# Patient Record
Sex: Male | Born: 1960 | Race: White | Hispanic: No | Marital: Married | Smoking: Never smoker
Health system: Southern US, Community
[De-identification: ages and names within clinical notes are randomized; demographics above are authoritative.]

## PROBLEM LIST (undated history)

## (undated) DIAGNOSIS — F419 Anxiety disorder, unspecified: Secondary | ICD-10-CM

## (undated) DIAGNOSIS — I493 Ventricular premature depolarization: Secondary | ICD-10-CM

## (undated) DIAGNOSIS — I77810 Thoracic aortic ectasia: Secondary | ICD-10-CM

## (undated) DIAGNOSIS — G4733 Obstructive sleep apnea (adult) (pediatric): Secondary | ICD-10-CM

## (undated) DIAGNOSIS — E785 Hyperlipidemia, unspecified: Secondary | ICD-10-CM

## (undated) DIAGNOSIS — I480 Paroxysmal atrial fibrillation: Secondary | ICD-10-CM

## (undated) DIAGNOSIS — B019 Varicella without complication: Secondary | ICD-10-CM

## (undated) DIAGNOSIS — I5042 Chronic combined systolic (congestive) and diastolic (congestive) heart failure: Secondary | ICD-10-CM

## (undated) DIAGNOSIS — E669 Obesity, unspecified: Secondary | ICD-10-CM

## (undated) DIAGNOSIS — I255 Ischemic cardiomyopathy: Secondary | ICD-10-CM

## (undated) DIAGNOSIS — I4901 Ventricular fibrillation: Secondary | ICD-10-CM

## (undated) DIAGNOSIS — I2510 Atherosclerotic heart disease of native coronary artery without angina pectoris: Secondary | ICD-10-CM

## (undated) DIAGNOSIS — I10 Essential (primary) hypertension: Secondary | ICD-10-CM

## (undated) DIAGNOSIS — I509 Heart failure, unspecified: Secondary | ICD-10-CM

## (undated) HISTORY — DX: Anxiety disorder, unspecified: F41.9

## (undated) HISTORY — DX: Chronic combined systolic (congestive) and diastolic (congestive) heart failure: I50.42

## (undated) HISTORY — DX: Essential (primary) hypertension: I10

## (undated) HISTORY — DX: Varicella without complication: B01.9

## (undated) HISTORY — DX: Ventricular fibrillation: I49.01

## (undated) HISTORY — DX: Thoracic aortic ectasia: I77.810

## (undated) HISTORY — DX: Ischemic cardiomyopathy: I25.5

## (undated) HISTORY — DX: Atherosclerotic heart disease of native coronary artery without angina pectoris: I25.10

## (undated) HISTORY — DX: Paroxysmal atrial fibrillation: I48.0

## (undated) HISTORY — DX: Obesity, unspecified: E66.9

## (undated) HISTORY — DX: Obstructive sleep apnea (adult) (pediatric): G47.33

## (undated) HISTORY — DX: Ventricular premature depolarization: I49.3

## (undated) HISTORY — DX: Hyperlipidemia, unspecified: E78.5

---

## 2012-03-10 ENCOUNTER — Inpatient Hospital Stay: Payer: Self-pay | Admitting: Internal Medicine

## 2012-03-10 DIAGNOSIS — I219 Acute myocardial infarction, unspecified: Secondary | ICD-10-CM

## 2012-03-10 LAB — CBC
HCT: 43.6 % (ref 40.0–52.0)
MCHC: 32.8 g/dL (ref 32.0–36.0)
MCV: 77 fL — ABNORMAL LOW (ref 80–100)
RBC: 5.63 10*6/uL (ref 4.40–5.90)
RDW: 15.7 % — ABNORMAL HIGH (ref 11.5–14.5)

## 2012-03-10 LAB — COMPREHENSIVE METABOLIC PANEL
Alkaline Phosphatase: 95 U/L (ref 50–136)
BUN: 15 mg/dL (ref 7–18)
Bilirubin,Total: 0.2 mg/dL (ref 0.2–1.0)
Co2: 24 mmol/L (ref 21–32)
Creatinine: 0.87 mg/dL (ref 0.60–1.30)
EGFR (Non-African Amer.): >60
Osmolality: 281 (ref 275–301)
SGOT(AST): 22 U/L (ref 15–37)
SGPT (ALT): 26 U/L (ref 12–78)
Sodium: 139 mmol/L (ref 136–145)
Total Protein: 7.8 g/dL (ref 6.4–8.2)

## 2012-03-10 LAB — CBC WITH DIFFERENTIAL/PLATELET
Eosinophil %: 0.2 %
HCT: 41.6 % (ref 40.0–52.0)
Lymphocyte #: 0.9 10*3/uL — ABNORMAL LOW (ref 1.0–3.6)
MCV: 78 fL — ABNORMAL LOW (ref 80–100)
Monocyte %: 4.6 %
Neutrophil #: 7.3 10*3/uL — ABNORMAL HIGH (ref 1.4–6.5)
Neutrophil %: 84.2 %
Platelet: 268 10*3/uL (ref 150–440)
RBC: 5.31 10*6/uL (ref 4.40–5.90)
WBC: 8.7 10*3/uL (ref 3.8–10.6)

## 2012-03-10 LAB — TROPONIN I
Troponin-I: 0.03 ng/mL
Troponin-I: 0.44 ng/mL — ABNORMAL HIGH
Troponin-I: 1.5 ng/mL — ABNORMAL HIGH

## 2012-03-10 LAB — BASIC METABOLIC PANEL
BUN: 16 mg/dL (ref 7–18)
Creatinine: 0.67 mg/dL (ref 0.60–1.30)
EGFR (Non-African Amer.): >60
Glucose: 111 mg/dL — ABNORMAL HIGH (ref 65–99)
Osmolality: 278 (ref 275–301)
Potassium: 4.5 mmol/L (ref 3.5–5.1)
Sodium: 138 mmol/L (ref 136–145)

## 2012-03-10 LAB — PROTIME-INR
INR: 0.9
Prothrombin Time: 12.2 secs (ref 11.5–14.7)

## 2012-03-10 LAB — CK TOTAL AND CKMB (NOT AT ARMC): CK, Total: 235 U/L — ABNORMAL HIGH (ref 35–232)

## 2012-03-10 LAB — MAGNESIUM: Magnesium: 1.9 mg/dL

## 2012-03-11 DIAGNOSIS — I059 Rheumatic mitral valve disease, unspecified: Secondary | ICD-10-CM

## 2012-03-11 DIAGNOSIS — I2510 Atherosclerotic heart disease of native coronary artery without angina pectoris: Secondary | ICD-10-CM

## 2012-03-11 LAB — BASIC METABOLIC PANEL
Anion Gap: 9 (ref 7–16)
BUN: 10 mg/dL (ref 7–18)
Calcium, Total: 8.1 mg/dL — ABNORMAL LOW (ref 8.5–10.1)
Chloride: 100 mmol/L (ref 98–107)
Co2: 28 mmol/L (ref 21–32)
EGFR (Non-African Amer.): >60
Osmolality: 274 (ref 275–301)
Potassium: 3.7 mmol/L (ref 3.5–5.1)

## 2012-03-11 LAB — LIPID PANEL
Cholesterol: 167 mg/dL (ref 0–200)
HDL Cholesterol: 41 mg/dL (ref 40–60)
Triglycerides: 98 mg/dL (ref 0–200)

## 2012-03-11 LAB — APTT: Activated PTT: 108.6 secs — ABNORMAL HIGH (ref 23.6–35.9)

## 2012-03-11 LAB — TROPONIN I: Troponin-I: 9.9 ng/mL — ABNORMAL HIGH

## 2012-03-12 DIAGNOSIS — I2510 Atherosclerotic heart disease of native coronary artery without angina pectoris: Secondary | ICD-10-CM | POA: Insufficient documentation

## 2012-03-12 HISTORY — PX: CORONARY ANGIOPLASTY: SHX604

## 2012-03-12 HISTORY — PX: CARDIAC CATHETERIZATION: SHX172

## 2012-03-12 LAB — CK TOTAL AND CKMB (NOT AT ARMC): CK-MB: 4.5 ng/mL — ABNORMAL HIGH (ref 0.5–3.6)

## 2012-03-13 LAB — BASIC METABOLIC PANEL
Anion Gap: 7 (ref 7–16)
Calcium, Total: 8.5 mg/dL (ref 8.5–10.1)
Chloride: 101 mmol/L (ref 98–107)
Co2: 28 mmol/L (ref 21–32)
Creatinine: 0.79 mg/dL (ref 0.60–1.30)
EGFR (African American): >60
Osmolality: 271 (ref 275–301)
Potassium: 3.8 mmol/L (ref 3.5–5.1)

## 2012-03-22 ENCOUNTER — Encounter: Payer: Self-pay | Admitting: Cardiovascular Disease

## 2012-03-22 ENCOUNTER — Ambulatory Visit (INDEPENDENT_AMBULATORY_CARE_PROVIDER_SITE_OTHER): Payer: BC Managed Care – PPO | Admitting: Cardiovascular Disease

## 2012-03-22 VITALS — BP 132/90 | HR 98 | Ht 70.0 in | Wt 326.0 lb

## 2012-03-22 DIAGNOSIS — I10 Essential (primary) hypertension: Secondary | ICD-10-CM

## 2012-03-22 DIAGNOSIS — E785 Hyperlipidemia, unspecified: Secondary | ICD-10-CM | POA: Insufficient documentation

## 2012-03-22 DIAGNOSIS — I2510 Atherosclerotic heart disease of native coronary artery without angina pectoris: Secondary | ICD-10-CM

## 2012-03-22 DIAGNOSIS — I25810 Atherosclerosis of coronary artery bypass graft(s) without angina pectoris: Secondary | ICD-10-CM

## 2012-03-22 NOTE — Assessment & Plan Note (Signed)
His blood pressure has improved significantly on medications.

## 2012-03-22 NOTE — Patient Instructions (Addendum)
Continue same medications.  Try to attend cardiac rehab.  Follow up in 3 months. Be fasting that day to get labs done.

## 2012-03-22 NOTE — Assessment & Plan Note (Signed)
Continue treatment with atorvastatin with a target LDL of less than 70. This will be checked during his next followup visit.

## 2012-03-22 NOTE — Assessment & Plan Note (Signed)
The patient is doing well after her recent non-ST elevation myocardial infarction. He had an angioplasty and drug-eluting stent placement to the LAD. I recommend dual antiplatelet therapy for at least one year and preferably longer. I discussed with him the importance of treating his risk factors and lifestyle changes as well as weight loss. I advised him to attend cardiac rehabilitation.

## 2012-03-22 NOTE — Progress Notes (Signed)
HPI  This is a 51 year old man who is here today for a followup visit. He presented recently to [facility] with a small non-ST elevation myocardial infarction. He underwent cardiac catheterization which showed severe single-vessel coronary artery disease in the left anterior descending artery. There was a 60% proximal stenosis followed by diffuse 80% disease in the midsegment and a 70% discrete stenosis distally. He underwent an angioplasty and drug-eluting stent placement to the proximal and mid LAD without complications. The distal LAD lesion was left to be treated medically. He was noted to be hypertensive and was not on medications. He was started on appropriate medications and also was started on atorvastatin for hyperlipidemia. Overall, he is feeling well. He denies any recurrent chest pain. He is recovering from a cold.  No Known Allergies   Current Outpatient Prescriptions on File Prior to Visit  Medication Sig Dispense Refill  . ALPRAZolam (XANAX) 0.25 MG tablet Take 0.25 mg by mouth at bedtime as needed.      Marland Kitchen amLODipine (NORVASC) 10 MG tablet Take 10 mg by mouth daily.      Marland Kitchen aspirin 325 MG tablet Take 325 mg by mouth daily.      Marland Kitchen atorvastatin (LIPITOR) 40 MG tablet Take 40 mg by mouth daily.      . clopidogrel (PLAVIX) 75 MG tablet Take 75 mg by mouth daily.      Marland Kitchen lisinopril (PRINIVIL,ZESTRIL) 40 MG tablet Take 40 mg by mouth daily.      . metoprolol (LOPRESSOR) 50 MG tablet Take 50 mg by mouth 2 (two) times daily.      . nitroGLYCERIN (NITROSTAT) 0.4 MG SL tablet Place 0.4 mg under the tongue every 5 (five) minutes as needed.         Past Medical History  Diagnosis Date  . Anxiety   . Obesity   . Hypokalemia   . Non-ST elevation MI (NSTEMI)   . Coronary artery disease 03/12/2012    Non-ST elevation myocardial infarction. Cardiac catheterization: LAD: 60% proximal, 80% diffuse mid and 70% distal disease. No significant obstructive disease in LCx and RCA.  s/p drug-eluting  stent to the proximal and mid LAD.  Marland Kitchen Hyperlipidemia   . Hypertension      Past Surgical History  Procedure Date  . Cardiac catheterization 03/12/2012  . Coronary angioplasty 03/12/2012    s/p drug eluting stent to the mid LAD : 3.5 x 38 mm Xience drug-eluting stent. Post dilated to 4.0 in mid segment and 4.5 proximally.     Family History  Problem Relation Age of Onset  . Heart disease Mother   . Hyperlipidemia Mother   . Hypertension Mother   . Heart attack Mother 24    CABG X 4; past at age 57  . Heart disease Sister 34    s/p stents      History   Social History  . Marital Status: Widowed    Spouse Name: N/A    Number of Children: N/A  . Years of Education: N/A   Occupational History  . Not on file.   Social History Main Topics  . Smoking status: Never Smoker   . Smokeless tobacco: Not on file  . Alcohol Use: 1.2 oz/week    2 Cans of beer per week  . Drug Use: No  . Sexually Active:    Other Topics Concern  . Not on file   Social History Narrative  . No narrative on file  PHYSICAL EXAM   BP 132/90  Pulse 98  Ht 5\' 10"  (1.778 m)  Wt 326 lb (147.873 kg)  BMI 46.78 kg/m2 Constitutional: He is oriented to person, place, and time. He appears well-developed and well-nourished. No distress.  HENT: No nasal discharge.  Head: Normocephalic and atraumatic.  Eyes: Pupils are equal and round. Right eye exhibits no discharge. Left eye exhibits no discharge.  Neck: Normal range of motion. Neck supple. No JVD present. No thyromegaly present.  Cardiovascular: Normal rate, regular rhythm, normal heart sounds and. Exam reveals no gallop and no friction rub. No murmur heard.  Pulmonary/Chest: Effort normal and breath sounds normal. No stridor. No respiratory distress. He has no wheezes. He has no rales. He exhibits no tenderness.  Abdominal: Soft. Bowel sounds are normal. He exhibits no distension. There is no tenderness. There is no rebound and no guarding.    Musculoskeletal: Normal range of motion. He exhibits no edema and no tenderness.  Neurological: He is alert and oriented to person, place, and time. Coordination normal.  Skin: Skin is warm and dry. No rash noted. He is not diaphoretic. No erythema. No pallor.  Psychiatric: He has a normal mood and affect. His behavior is normal. Judgment and thought content normal.  Right radial pulse is normal with no hematoma.     EKG: Sinus  Rhythm  WITHIN NORMAL LIMITS   ASSESSMENT AND PLAN

## 2012-04-13 ENCOUNTER — Other Ambulatory Visit: Payer: Self-pay | Admitting: Physician Assistant

## 2012-04-30 ENCOUNTER — Telehealth: Payer: Self-pay

## 2012-04-30 ENCOUNTER — Observation Stay: Payer: Self-pay | Admitting: Internal Medicine

## 2012-04-30 LAB — COMPREHENSIVE METABOLIC PANEL
Albumin: 3.5 g/dL (ref 3.4–5.0)
Anion Gap: 7 (ref 7–16)
BUN: 13 mg/dL (ref 7–18)
Bilirubin,Total: 0.3 mg/dL (ref 0.2–1.0)
Calcium, Total: 8.5 mg/dL (ref 8.5–10.1)
Chloride: 106 mmol/L (ref 98–107)
EGFR (African American): >60
Glucose: 103 mg/dL — ABNORMAL HIGH (ref 65–99)
Osmolality: 282 (ref 275–301)
Potassium: 3.7 mmol/L (ref 3.5–5.1)
SGOT(AST): 30 U/L (ref 15–37)
Sodium: 141 mmol/L (ref 136–145)

## 2012-04-30 LAB — CBC
MCH: 26.2 pg (ref 26.0–34.0)
MCHC: 33.4 g/dL (ref 32.0–36.0)
Platelet: 278 10*3/uL (ref 150–440)
RBC: 5.24 10*6/uL (ref 4.40–5.90)
WBC: 6.2 10*3/uL (ref 3.8–10.6)

## 2012-04-30 LAB — CK TOTAL AND CKMB (NOT AT ARMC)
CK, Total: 128 U/L (ref 35–232)
CK-MB: 1.3 ng/mL (ref 0.5–3.6)

## 2012-04-30 LAB — TROPONIN I: Troponin-I: <0.02 ng/mL

## 2012-04-30 NOTE — Telephone Encounter (Signed)
Pt calling in with c/o sudden onset left sided CP. He has taken SL NTG x 2 without relief. He cannot manipulate area to reproduce pain. Says it is similar to pain felt prior to recent MI.  Associated with fatigue and weakness. Advised pt to go to ER. Understanding verb.

## 2012-05-01 ENCOUNTER — Telehealth: Payer: Self-pay

## 2012-05-01 DIAGNOSIS — R079 Chest pain, unspecified: Secondary | ICD-10-CM

## 2012-05-01 LAB — TROPONIN I
Troponin-I: <0.02 ng/mL
Troponin-I: <0.02 ng/mL

## 2012-05-01 LAB — CK TOTAL AND CKMB (NOT AT ARMC)
CK, Total: 103 U/L (ref 35–232)
CK-MB: 1.2 ng/mL (ref 0.5–3.6)
CK-MB: 1.3 ng/mL (ref 0.5–3.6)

## 2012-05-01 NOTE — Telephone Encounter (Signed)
Pt d/c this am Will call back 05/02/12 for TCM call

## 2012-05-01 NOTE — Telephone Encounter (Signed)
Message copied by Marcelle Overlie on Wed May 01, 2012 10:50 AM ------      Message from: Thersa Salt      Created: Wed May 01, 2012  9:41 AM      Regarding: TCM pt       Pt to see Kirke Corin 10/22

## 2012-05-01 NOTE — Telephone Encounter (Signed)
TCM call

## 2012-05-02 ENCOUNTER — Encounter: Payer: Self-pay | Admitting: *Deleted

## 2012-05-02 NOTE — Telephone Encounter (Signed)
Pt has f/u visit scheduled for 05/14/12.

## 2012-05-02 NOTE — Telephone Encounter (Signed)
Called pt to find out how he is doing.  Person answering the phone states that he is at work and seems to be doing ok.  No problems that this person knows of.  Pt will not be home until 6:00pm or later.  LM for pt to call if he is having any problems or concerns.

## 2012-05-06 ENCOUNTER — Encounter: Payer: BC Managed Care – PPO | Admitting: Cardiovascular Disease

## 2012-05-14 ENCOUNTER — Encounter: Payer: Self-pay | Admitting: Cardiovascular Disease

## 2012-05-14 ENCOUNTER — Ambulatory Visit (INDEPENDENT_AMBULATORY_CARE_PROVIDER_SITE_OTHER): Payer: BC Managed Care – PPO | Admitting: Cardiovascular Disease

## 2012-05-14 VITALS — BP 150/92 | HR 86 | Ht 70.0 in | Wt 350.8 lb

## 2012-05-14 DIAGNOSIS — I10 Essential (primary) hypertension: Secondary | ICD-10-CM

## 2012-05-14 DIAGNOSIS — I2510 Atherosclerotic heart disease of native coronary artery without angina pectoris: Secondary | ICD-10-CM

## 2012-05-14 DIAGNOSIS — E785 Hyperlipidemia, unspecified: Secondary | ICD-10-CM

## 2012-05-14 MED ORDER — METOPROLOL TARTRATE 50 MG PO TABS: 50.0000 mg | ORAL_TABLET | Freq: Two times a day (BID) | ORAL | Status: DC

## 2012-05-14 MED ORDER — CLOPIDOGREL BISULFATE 75 MG PO TABS: 75.0000 mg | ORAL_TABLET | Freq: Every day | ORAL | Status: DC

## 2012-05-14 MED ORDER — LISINOPRIL 40 MG PO TABS: 40.0000 mg | ORAL_TABLET | Freq: Every day | ORAL | Status: DC

## 2012-05-14 MED ORDER — PANTOPRAZOLE SODIUM 40 MG PO TBEC: 40.0000 mg | DELAYED_RELEASE_TABLET | Freq: Every day | ORAL | Status: DC

## 2012-05-14 MED ORDER — AMLODIPINE BESYLATE 10 MG PO TABS: 10.0000 mg | ORAL_TABLET | Freq: Every day | ORAL | Status: DC

## 2012-05-14 NOTE — Patient Instructions (Addendum)
Stop Omeprazole.  Start Protonix 40 mg once daily (do not take at the same time with Plavix).  Resume Lipitor.  Try to attend cardiac rehab.  Follow up in 2 months

## 2012-05-14 NOTE — Assessment & Plan Note (Signed)
The patient had a recent hospitalization for chest discomfort which overall was atypical and could have been musculoskeletal. However, he reports that it was similar but less intense than his previous myocardial infarction. His cardiac enzymes were negative an ECG shows no acute changes. I recommend continuing medical therapy for now. He can resume atorvastatin and reevaluate his symptoms. If he develops recurrent chest pain, I will have a low threshold for coronary angiography due to a long drug-eluting stent in the proximal LAD with residual disease distally that was left to be treated medically. I also advised him to attend cardiac rehabilitation. Due to interaction with Plavix, I will stop omeprazole and switch him to protonic 40 mg once daily.

## 2012-05-14 NOTE — Assessment & Plan Note (Signed)
His blood pressure is elevated. However, he did not take any of his blood pressure medications today due to lack of refills. I refilled all his medications.

## 2012-05-14 NOTE — Assessment & Plan Note (Signed)
Resume Lipitor. If he develops myalgia, I would consider rosuvastatin.

## 2012-05-14 NOTE — Progress Notes (Signed)
HPI  This is a 51 year old man who is here today for a followup visit. He presented in August  to [facility] with a small non-ST elevation myocardial infarction. He underwent cardiac catheterization which showed severe single-vessel coronary artery disease in the left anterior descending artery. There was a 60% proximal stenosis followed by diffuse 80% disease in the midsegment and a 70% discrete stenosis distally. He underwent an angioplasty and long drug-eluting stent placement to the proximal and mid LAD without complications. The distal LAD lesion was left to be treated medically. He was noted to be hypertensive and was not on medications. He was started on appropriate medications and also was started on atorvastatin for hyperlipidemia.  He was hospitalized briefly recently at [facility] for one episode of chest discomfort. It was left-sided described as an aching sensation and superficial. The chest area was tender to touch. Although his symptoms were atypical, he reports that this is similar to his previous myocardial infarction but less intense. He ruled out for myocardial infarction. ECG showed no acute changes. He does have known history of GERD but the symptoms were different from his usual heartburn. One possibility was musculoskeletal pain related to atorvastatin. He was told to hold the medication reevaluate the response. Overall, he feels better. He has not had chest pain since hospital discharge. He denies exertional symptoms. He is still at significant stress as his son still waiting for a liver transplant. No Known Allergies   Current Outpatient Prescriptions on File Prior to Visit  Medication Sig Dispense Refill  . ALPRAZolam (XANAX) 0.25 MG tablet Take 0.25 mg by mouth at bedtime as needed.      Marland Kitchen aspirin 81 MG tablet Take 81 mg by mouth daily.      . nitroGLYCERIN (NITROSTAT) 0.4 MG SL tablet Place 0.4 mg under the tongue every 5 (five) minutes as needed.      Marland Kitchen DISCONTD: amLODipine  (NORVASC) 10 MG tablet Take 10 mg by mouth daily.      Marland Kitchen DISCONTD: lisinopril (PRINIVIL,ZESTRIL) 40 MG tablet Take 40 mg by mouth daily.      Marland Kitchen DISCONTD: metoprolol (LOPRESSOR) 50 MG tablet Take 50 mg by mouth 2 (two) times daily.      . pantoprazole (PROTONIX) 40 MG tablet Take 1 tablet (40 mg total) by mouth daily.  30 tablet  6     Past Medical History  Diagnosis Date  . Anxiety   . Obesity   . Hypokalemia   . Non-ST elevation MI (NSTEMI)   . Coronary artery disease 03/12/2012    Non-ST elevation myocardial infarction. Cardiac catheterization: LAD: 60% proximal, 80% diffuse mid and 70% distal disease. No significant obstructive disease in LCx and RCA.  s/p drug-eluting stent to the proximal and mid LAD.  Marland Kitchen Hyperlipidemia   . Hypertension      Past Surgical History  Procedure Date  . Cardiac catheterization 03/12/2012  . Coronary angioplasty 03/12/2012    s/p drug eluting stent to the mid LAD : 3.5 x 38 mm Xience drug-eluting stent. Post dilated to 4.0 in mid segment and 4.5 proximally.     Family History  Problem Relation Age of Onset  . Heart disease Mother   . Hyperlipidemia Mother   . Hypertension Mother   . Heart attack Mother 27    CABG X 4; past at age 21  . Heart disease Sister 75    s/p stents      History   Social History  .  Marital Status: Widowed    Spouse Name: N/A    Number of Children: N/A  . Years of Education: N/A   Occupational History  . Not on file.   Social History Main Topics  . Smoking status: Never Smoker   . Smokeless tobacco: Not on file  . Alcohol Use: 1.2 oz/week    2 Cans of beer per week     weekly  . Drug Use: No  . Sexually Active:    Other Topics Concern  . Not on file   Social History Narrative  . No narrative on file      PHYSICAL EXAM   BP 150/92  Pulse 86  Ht 5\' 10"  (1.778 m)  Wt 350 lb 12 oz (159.099 kg)  BMI 50.33 kg/m2 Constitutional: He is oriented to person, place, and time. He appears well-developed  and well-nourished. No distress.  HENT: No nasal discharge.  Head: Normocephalic and atraumatic.  Eyes: Pupils are equal and round. Right eye exhibits no discharge. Left eye exhibits no discharge.  Neck: Normal range of motion. Neck supple. No JVD present. No thyromegaly present.  Cardiovascular: Normal rate, regular rhythm, normal heart sounds and. Exam reveals no gallop and no friction rub. No murmur heard.  Pulmonary/Chest: Effort normal and breath sounds normal. No stridor. No respiratory distress. He has no wheezes. He has no rales. He exhibits no tenderness.  Abdominal: Soft. Bowel sounds are normal. He exhibits no distension. There is no tenderness. There is no rebound and no guarding.  Musculoskeletal: Normal range of motion. He exhibits no edema and no tenderness.  Neurological: He is alert and oriented to person, place, and time. Coordination normal.  Skin: Skin is warm and dry. No rash noted. He is not diaphoretic. No erythema. No pallor.  Psychiatric: He has a normal mood and affect. His behavior is normal. Judgment and thought content normal.  Right radial pulse is normal with no hematoma.     EKG: Sinus  Rhythm  WITHIN NORMAL LIMITS   ASSESSMENT AND PLAN

## 2012-05-24 ENCOUNTER — Encounter: Payer: Self-pay | Admitting: Cardiovascular Disease

## 2012-06-24 ENCOUNTER — Ambulatory Visit: Payer: BC Managed Care – PPO | Admitting: Cardiovascular Disease

## 2012-07-23 ENCOUNTER — Ambulatory Visit: Payer: BC Managed Care – PPO | Admitting: Cardiovascular Disease

## 2012-07-23 ENCOUNTER — Other Ambulatory Visit: Payer: Self-pay

## 2012-07-23 DIAGNOSIS — I2510 Atherosclerotic heart disease of native coronary artery without angina pectoris: Secondary | ICD-10-CM

## 2012-07-31 ENCOUNTER — Encounter: Payer: Self-pay | Admitting: Nurse Practitioner

## 2012-07-31 ENCOUNTER — Ambulatory Visit (INDEPENDENT_AMBULATORY_CARE_PROVIDER_SITE_OTHER): Payer: BC Managed Care – PPO | Admitting: Nurse Practitioner

## 2012-07-31 VITALS — BP 148/102 | HR 98 | Ht 70.5 in | Wt 342.5 lb

## 2012-07-31 DIAGNOSIS — F411 Generalized anxiety disorder: Secondary | ICD-10-CM

## 2012-07-31 DIAGNOSIS — F419 Anxiety disorder, unspecified: Secondary | ICD-10-CM

## 2012-07-31 DIAGNOSIS — I2510 Atherosclerotic heart disease of native coronary artery without angina pectoris: Secondary | ICD-10-CM

## 2012-07-31 DIAGNOSIS — I10 Essential (primary) hypertension: Secondary | ICD-10-CM

## 2012-07-31 MED ORDER — ATORVASTATIN CALCIUM 40 MG PO TABS: 40.0000 mg | ORAL_TABLET | Freq: Every day | ORAL | Status: DC

## 2012-07-31 MED ORDER — METOPROLOL TARTRATE 100 MG PO TABS: 100.0000 mg | ORAL_TABLET | Freq: Two times a day (BID) | ORAL | Status: DC

## 2012-07-31 MED ORDER — ALPRAZOLAM 0.25 MG PO TABS: 0.2500 mg | ORAL_TABLET | Freq: Two times a day (BID) | ORAL | Status: DC | PRN

## 2012-07-31 NOTE — Progress Notes (Signed)
Patient Name: Steve Burnett Date of Encounter: 07/31/2012  Primary Care Provider:  He does not have a PCP. Primary Cardiologist:  M. Kirke Corin, MD  Patient Profile  36 male with history of coronary artery disease who presents for followup.  Problem List   Past Medical History  Diagnosis Date  . Anxiety   . Obesity   . Hypokalemia   . Coronary artery disease     a. 02/2012 NSTEMI/Cath/PCI: LAD: 60% proximal, 80% diffuse mid & 70d, nonobs LCX/RCA --> DES to the prox/mid LAD.  Marland Kitchen Hyperlipidemia   . Hypertension    Past Surgical History  Procedure Date  . Cardiac catheterization 03/12/2012  . Coronary angioplasty 03/12/2012    s/p drug eluting stent to the mid LAD : 3.5 x 38 mm Xience drug-eluting stent. Post dilated to 4.0 in mid segment and 4.5 proximally.   Allergies  No Known Allergies  HPI  52 year old male with the above problem list. He is status post non-ST segment elevation MI in August 2013 with successful drug-eluting stent placement to the proximal mid LAD at that time. He has been doing well since that time without recurrence of angina over dyspnea. He has a fair amount of anxiety in his life as his son is ill with hepatic failure and is currently awaiting a transplant. Patient is somewhat tearful today in discussing it. In this setting, he is noted that his blood pressure often is in the 150s to 160s at home. He previously was taking when necessary Xanax but does not have a primary care provider and has been out of this for a little while.  He's been tolerating his medications well.  Home Medications  Prior to Admission medications   Medication Sig Start Date End Date Taking? Authorizing Provider  amLODipine (NORVASC) 10 MG tablet Take 1 tablet (10 mg total) by mouth daily. 05/14/12  Yes Iran Ouch, MD  aspirin 81 MG tablet Take 81 mg by mouth daily.   Yes Historical Provider, MD  clopidogrel (PLAVIX) 75 MG tablet Take 1 tablet (75 mg total) by mouth daily.  05/14/12  Yes Iran Ouch, MD  lisinopril (PRINIVIL,ZESTRIL) 40 MG tablet Take 1 tablet (40 mg total) by mouth daily. 05/14/12  Yes Iran Ouch, MD  nitroGLYCERIN (NITROSTAT) 0.4 MG SL tablet Place 0.4 mg under the tongue every 5 (five) minutes as needed.   Yes Historical Provider, MD  pantoprazole (PROTONIX) 40 MG tablet Take 1 tablet (40 mg total) by mouth daily. 05/14/12  Yes Iran Ouch, MD  ALPRAZolam Prudy Feeler) 0.25 MG tablet Take 1 tablet (0.25 mg total) by mouth 2 (two) times daily as needed. 07/31/12   Ok Anis, NP  atorvastatin (LIPITOR) 40 MG tablet Take 1 tablet (40 mg total) by mouth daily. 07/31/12   Ok Anis, NP  metoprolol (LOPRESSOR) 100 MG tablet Take 1 tablet (100 mg total) by mouth 2 (two) times daily. 07/31/12   Ok Anis, NP   Review of Systems  He denies chest pain, doe, pnd, orthopnea, n, v, dizziness, syncope, edema, weight gain, or early satiety.  He has significant anxiety, compounded by his son's illness.  All other systems reviewed and are otherwise negative except as noted above.  Physical Exam  Blood pressure 148/102, pulse 98, height 5' 10.5" (1.791 m), weight 342 lb 8 oz (155.357 kg).  General: Pleasant, NAD Psych: Normal affect. Neuro: Alert and oriented X 3. Moves all extremities spontaneously. HEENT: Normal  Neck: Supple without  bruits or JVD. Lungs:  Resp regular and unlabored, CTA. Heart: RRR no s3, s4, or murmurs. Abdomen: Soft, non-tender, non-distended, BS + x 4.  Extremities: No clubbing, cyanosis or edema. DP/PT/Radials 2+ and equal bilaterally.  Accessory Clinical Findings  ECG - rsr, 98, no acute st/t changes.  Assessment & Plan  1.  Coronary artery disease: Patient is status post PCI and stenting of the LAD. He remains on aspirin, Plavix, and beta blocker therapy. Though when he was last seen in October he agreed to resume Lipitor therapy, he never did this.  He is agreeable to resuming Lipitor at this  time. His blood pressure is quite high today and he says typically it runs higher at home. I will titrate his metoprolol to 100 mg twice a day.  2. Hypertension: As above, he is hypertensive today and notes that his pressures typically run in the 150s to 160s at home. His heart rate is also in the 90s and he says this is fairly typical as well in the setting of significant anxiety. I have titrated his metoprolol to 100 mg twice a day. I suspect he may require an additional agent as well. I recommended that he call him in one week with low pressure recordings for [place] to review. He tells me that he has been on diuretic therapy in the past and this caused significant cramping despite supplemental potassium. He would be reluctant to take diuretics again in the future. He is already on high-dose amlodipine and lisinopril. If he requires an additional agent he may benefit from either switching his beta blocker to carvedilol or labetalol, or adding clonidine.  3. Hyperlipidemia: Patient never resumed statin therapy. He is agreeable to resume it today. He was scheduled to have lipids and LFTs checked today but as he has not been taking a statin, we will push these back for 8 more weeks.  4. Anxiety: At this time this is his biggest problem. I have refilled his Xanax 0.25 mg twice a day when necessary with 0 additional refills. He does not currently have primary care established and I stressed the importance of primary care followup for longer term medication management of anxiety as historically it sounds as though he has only been taking short acting benzodiazepines when he has them available.  5. Morbid obesity: I suspect that his anxiety and depression related to his son's illness is playing a role in his persistent obesity and weight gain. I asked if he had a plan for weight loss and he became tearful at that time and said that with everything going on with his son right now he has no plans.  6. Disposition:  Patient has been advised to call us in one week with blood pressure recordings. We will followup lipids and LFTs in 8 weeks as he plans to resume statin therapy today. He will followup with Dr. Kirke Corin in approximately 6 months.  Nicolasa Ducking, NP 07/31/2012, 10:10 AM

## 2012-07-31 NOTE — Patient Instructions (Addendum)
Your physician wants you to follow-up in: 6 months with Dr. Kirke Corin. You will receive a reminder letter in the mail two months in advance. If you don't receive a letter, please call our office to schedule the follow-up appointment.  Your physician has recommended you make the following change in your medication:  -start taking lipitor -increase metoprolol to 100 mg twice daily  Your physician recommends that you return for lab work in: 8 weeks. Make sure you are fasting for this lab work.  Call us with blood pressure readings next week [phone]  Establish care with a primary care physician.

## 2012-09-07 ENCOUNTER — Other Ambulatory Visit: Payer: Self-pay

## 2012-09-26 ENCOUNTER — Other Ambulatory Visit: Payer: BC Managed Care – PPO

## 2012-10-02 ENCOUNTER — Other Ambulatory Visit: Payer: Self-pay | Admitting: Cardiovascular Disease

## 2012-10-02 ENCOUNTER — Telehealth: Payer: Self-pay

## 2012-10-02 NOTE — Telephone Encounter (Signed)
I received refill request from pt's pharmacy for xanax Per Ward Givens, NP last note, I have faxed this back to pharmacy with instruction to contact pt's PCP, per last OV note

## 2012-10-07 NOTE — Telephone Encounter (Signed)
Do you want to refill this or send to PCP?

## 2012-10-09 ENCOUNTER — Telehealth: Payer: Self-pay

## 2012-10-09 NOTE — Telephone Encounter (Signed)
I attempted to reach pt to remind him he is due for L/L No answer

## 2012-10-10 NOTE — Telephone Encounter (Signed)
Per son pt is at work Will have pt call us back

## 2012-12-06 ENCOUNTER — Encounter (HOSPITAL_COMMUNITY): Payer: Self-pay | Admitting: Emergency Medicine

## 2012-12-06 ENCOUNTER — Emergency Department (HOSPITAL_COMMUNITY)
Admission: EM | Admit: 2012-12-06 | Discharge: 2012-12-06 | Disposition: A | Discharge status: Home / Self Care | Payer: BC Managed Care – PPO | Attending: Emergency Medicine | Admitting: Emergency Medicine

## 2012-12-06 ENCOUNTER — Emergency Department (HOSPITAL_COMMUNITY): Payer: BC Managed Care – PPO

## 2012-12-06 DIAGNOSIS — Z862 Personal history of diseases of the blood and blood-forming organs and certain disorders involving the immune mechanism: Secondary | ICD-10-CM | POA: Insufficient documentation

## 2012-12-06 DIAGNOSIS — R42 Dizziness and giddiness: Secondary | ICD-10-CM

## 2012-12-06 DIAGNOSIS — F411 Generalized anxiety disorder: Secondary | ICD-10-CM | POA: Insufficient documentation

## 2012-12-06 DIAGNOSIS — I10 Essential (primary) hypertension: Secondary | ICD-10-CM | POA: Insufficient documentation

## 2012-12-06 DIAGNOSIS — Z79899 Other long term (current) drug therapy: Secondary | ICD-10-CM | POA: Insufficient documentation

## 2012-12-06 DIAGNOSIS — E785 Hyperlipidemia, unspecified: Secondary | ICD-10-CM | POA: Insufficient documentation

## 2012-12-06 DIAGNOSIS — I252 Old myocardial infarction: Secondary | ICD-10-CM | POA: Insufficient documentation

## 2012-12-06 DIAGNOSIS — E669 Obesity, unspecified: Secondary | ICD-10-CM | POA: Insufficient documentation

## 2012-12-06 DIAGNOSIS — Z7982 Long term (current) use of aspirin: Secondary | ICD-10-CM | POA: Insufficient documentation

## 2012-12-06 DIAGNOSIS — I2510 Atherosclerotic heart disease of native coronary artery without angina pectoris: Secondary | ICD-10-CM | POA: Insufficient documentation

## 2012-12-06 LAB — COMPREHENSIVE METABOLIC PANEL
ALT: 21 U/L (ref 0–53)
AST: 19 U/L (ref 0–37)
Calcium: 8.9 mg/dL (ref 8.4–10.5)
Sodium: 134 mEq/L — ABNORMAL LOW (ref 135–145)
Total Protein: 7.3 g/dL (ref 6.0–8.3)

## 2012-12-06 LAB — CBC WITH DIFFERENTIAL/PLATELET
Basophils Absolute: 0 10*3/uL (ref 0.0–0.1)
Eosinophils Absolute: 0.2 10*3/uL (ref 0.0–0.7)
Eosinophils Relative: 2 % (ref 0–5)
Lymphocytes Relative: 21 % (ref 12–46)
MCH: 25.2 pg — ABNORMAL LOW (ref 26.0–34.0)
MCV: 75 fL — ABNORMAL LOW (ref 78.0–100.0)
Platelets: 292 10*3/uL (ref 150–400)
RDW: 15.2 % (ref 11.5–15.5)
WBC: 6.9 10*3/uL (ref 4.0–10.5)

## 2012-12-06 LAB — POCT I-STAT TROPONIN I

## 2012-12-06 MED ORDER — ONDANSETRON HCL 4 MG/2ML IJ SOLN
INTRAMUSCULAR | Status: AC
  Administered 2012-12-06: 4 mg
  Filled 2012-12-06: qty 2

## 2012-12-06 MED ORDER — LORAZEPAM 2 MG/ML IJ SOLN
1.0000 mg | Freq: Once | INTRAMUSCULAR | Status: AC
  Administered 2012-12-06: 1 mg via INTRAVENOUS
  Filled 2012-12-06: qty 1

## 2012-12-06 MED ORDER — ONDANSETRON HCL 4 MG/2ML IJ SOLN
4.0000 mg | Freq: Once | INTRAMUSCULAR | Status: AC
  Administered 2012-12-06: 4 mg via INTRAVENOUS
  Filled 2012-12-06: qty 2

## 2012-12-06 MED ORDER — POTASSIUM CHLORIDE CRYS ER 20 MEQ PO TBCR
40.0000 meq | EXTENDED_RELEASE_TABLET | Freq: Once | ORAL | Status: AC
  Administered 2012-12-06: 40 meq via ORAL
  Filled 2012-12-06: qty 2

## 2012-12-06 MED ORDER — MECLIZINE HCL 25 MG PO TABS: 25.0000 mg | ORAL_TABLET | Freq: Four times a day (QID) | ORAL | Status: DC

## 2012-12-06 MED ORDER — MECLIZINE HCL 25 MG PO TABS
25.0000 mg | ORAL_TABLET | Freq: Once | ORAL | Status: AC
  Administered 2012-12-06: 25 mg via ORAL
  Filled 2012-12-06: qty 1

## 2012-12-06 MED ORDER — SODIUM CHLORIDE 0.9 % IV SOLN
INTRAVENOUS | Status: DC
  Administered 2012-12-06: 13:00:00 via INTRAVENOUS

## 2012-12-06 NOTE — ED Notes (Signed)
Patient transported to X-ray 

## 2012-12-06 NOTE — ED Notes (Signed)
[id number]<AV> Expected date:<BR> Expected time:<BR> Means of arrival:<BR> Comments:<BR> For triage-weak

## 2012-12-06 NOTE — ED Notes (Signed)
Per patient, was at [facility] and felt funny, thought it was related to blood sugar and went to eat something, still felt bad-c/o dizziness and weak-denies SOB, chest pain

## 2012-12-06 NOTE — ED Notes (Signed)
Denies pain anywhere.Does complain of dizziness when going from lying to sitting and to standing. Patient a bit anxious as evidenced by frequently deep breathing.

## 2012-12-06 NOTE — ED Provider Notes (Signed)
History     CSN: [id number]  Arrival date & time 12/06/12  1227   First MD Initiated Contact with Patient 12/06/12 1239      No chief complaint on file.   (Consider location/radiation/quality/duration/timing/severity/associated sxs/prior treatment) The history is provided by the patient.   Patient here with sudden onset of becoming dizzy just prior to arrival. 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 His dizziness described as room spinning and is without a headache. Does note some bilateral arm numbness but denies any chest pain or shortness of breath. Denies any abdominal pain. Denies any weakness in his arms or legs. Denies any neck pain. Denies any recent illnesses. No prior history of same. No treatment used prior to arrival Past Medical History  Diagnosis Date  . Anxiety   . Obesity   . Hypokalemia   . Coronary artery disease     a. 02/2012 NSTEMI/Cath/PCI: LAD: 60% proximal, 80% diffuse mid & 70d, nonobs LCX/RCA --> DES to the prox/mid LAD.  Marland Kitchen Hyperlipidemia   . Hypertension     Past Surgical History  Procedure Laterality Date  . Cardiac catheterization  03/12/2012  . Coronary angioplasty  03/12/2012    s/p drug eluting stent to the mid LAD : 3.5 x 38 mm Xience drug-eluting stent. Post dilated to 4.0 in mid segment and 4.5 proximally.    Family History  Problem Relation Age of Onset  . Heart disease Mother   . Hyperlipidemia Mother   . Hypertension Mother   . Heart attack Mother 52    CABG X 4; past at age 11  . Heart disease Sister 58    s/p stents     History  Substance Use Topics  . Smoking status: Never Smoker   . Smokeless tobacco: Not on file  . Alcohol Use: 1.2 oz/week    2 Cans of beer per week     Comment: weekly      Review of Systems  All other systems reviewed and are negative.    Allergies  Review of patient's allergies indicates no known allergies.  Home  Medications   Current Outpatient Rx  Name  Route  Sig  Dispense  Refill  . ALPRAZolam (XANAX) 0.25 MG tablet   Oral   Take 0.25 mg by mouth 2 (two) times daily as needed for anxiety.         Marland Kitchen amLODipine (NORVASC) 10 MG tablet   Oral   Take 1 tablet (10 mg total) by mouth daily.   30 tablet   6   . aspirin EC 81 MG tablet   Oral   Take 81 mg by mouth daily.         . clopidogrel (PLAVIX) 75 MG tablet   Oral   Take 1 tablet (75 mg total) by mouth daily.   30 tablet   6   . lisinopril (PRINIVIL,ZESTRIL) 40 MG tablet   Oral   Take 1 tablet (40 mg total) by mouth daily.   30 tablet   6   . metoprolol (LOPRESSOR) 100 MG tablet   Oral   Take 1 tablet (100 mg total) by mouth 2 (two) times daily.   60 tablet   3   . nitroGLYCERIN (NITROSTAT) 0.4 MG SL tablet   Sublingual   Place 0.4 mg under the tongue every 5 (five) minutes as needed for chest pain.          Marland Kitchen  pantoprazole (PROTONIX) 40 MG tablet   Oral   Take 1 tablet (40 mg total) by mouth daily.   30 tablet   6     There were no vitals taken for this visit.  Physical Exam  Nursing note and vitals reviewed. Constitutional: He is oriented to person, place, and time. He appears well-developed and well-nourished.  Non-toxic appearance. No distress.  HENT:  Head: Normocephalic and atraumatic.  Eyes: Conjunctivae, EOM and lids are normal. Pupils are equal, round, and reactive to light.  Neck: Normal range of motion. Neck supple. No tracheal deviation present. No mass present.  Cardiovascular: Normal rate, regular rhythm and normal heart sounds.  Exam reveals no gallop.   No murmur heard. Pulmonary/Chest: Effort normal and breath sounds normal. No stridor. No respiratory distress. He has no decreased breath sounds. He has no wheezes. He has no rhonchi. He has no rales.  Abdominal: Soft. Normal appearance and bowel sounds are normal. He exhibits no distension. There is no tenderness. There is no rebound and no  CVA tenderness.  Musculoskeletal: Normal range of motion. He exhibits no edema and no tenderness.  Neurological: He is alert and oriented to person, place, and time. He has normal strength. No cranial nerve deficit or sensory deficit. GCS eye subscore is 4. GCS verbal subscore is 5. GCS motor subscore is 6.  Skin: Skin is warm and dry. No abrasion and no rash noted.  Psychiatric: He has a normal mood and affect. His speech is normal and behavior is normal.    ED Course  Procedures (including critical care time)  Labs Reviewed  CBC WITH DIFFERENTIAL  COMPREHENSIVE METABOLIC PANEL   No results found.   No diagnosis found.    MDM   Date: 12/06/2012  Rate: 100  Rhythm: sinus tachycardia  QRS Axis: normal  Intervals: normal  ST/T Wave abnormalities: normal  Conduction Disutrbances:none  Narrative Interpretation:   Old EKG Reviewed: none available   2:01 PM Patient describes vertiginous type dizziness. He was given Ativan and Antivert he feels better. No concern for central vertigo I suspect this is peripheral. Cardiac workup. Negative. Likely to discharged home       Toy Baker, MD 12/06/12 1402

## 2012-12-17 ENCOUNTER — Other Ambulatory Visit: Payer: Self-pay | Admitting: Cardiovascular Disease

## 2012-12-17 ENCOUNTER — Other Ambulatory Visit: Payer: Self-pay

## 2012-12-17 DIAGNOSIS — I10 Essential (primary) hypertension: Secondary | ICD-10-CM

## 2012-12-17 DIAGNOSIS — I2510 Atherosclerotic heart disease of native coronary artery without angina pectoris: Secondary | ICD-10-CM

## 2012-12-17 MED ORDER — METOPROLOL TARTRATE 100 MG PO TABS: 100.0000 mg | ORAL_TABLET | Freq: Two times a day (BID) | ORAL | Status: DC

## 2012-12-17 NOTE — Telephone Encounter (Signed)
Prior Auth for Pantoprazole sod 40 mg take one tablet daily. Spoke with Lelon Mast with [facility] for authorization of Pantoprazole. Told Samantha patient is on plavix and was told to stop omeprazole and switch to protonix.

## 2012-12-17 NOTE — Telephone Encounter (Signed)
Refilled metoprolol, lisinopril, clopidogrel and amlodipine sent to [facility].

## 2012-12-17 NOTE — Telephone Encounter (Signed)
Approved for 6 months start date 12/17/2012 to 06/19/2013; spoke with Misty Stanley at [employer]. Notified pharmacy pantoprazole is approved.

## 2012-12-19 ENCOUNTER — Telehealth: Payer: Self-pay

## 2012-12-19 MED ORDER — PANTOPRAZOLE SODIUM 40 MG PO TBEC: 40.0000 mg | DELAYED_RELEASE_TABLET | Freq: Every day | ORAL | Status: DC

## 2012-12-19 NOTE — Telephone Encounter (Signed)
Pantoprazole 40 mg is approved from Dec 17, 2012 to June 19, 2013. Pharmacy is aware of approval.

## 2013-01-28 ENCOUNTER — Ambulatory Visit: Payer: BC Managed Care – PPO | Admitting: Cardiovascular Disease

## 2013-01-28 ENCOUNTER — Encounter: Payer: Self-pay | Admitting: *Deleted

## 2013-04-22 ENCOUNTER — Encounter: Payer: Self-pay | Admitting: Cardiovascular Disease

## 2013-04-22 ENCOUNTER — Ambulatory Visit (INDEPENDENT_AMBULATORY_CARE_PROVIDER_SITE_OTHER): Payer: BC Managed Care – PPO | Admitting: Cardiovascular Disease

## 2013-04-22 VITALS — BP 145/104 | HR 95 | Ht 70.0 in | Wt 302.5 lb

## 2013-04-22 DIAGNOSIS — E785 Hyperlipidemia, unspecified: Secondary | ICD-10-CM

## 2013-04-22 DIAGNOSIS — I2510 Atherosclerotic heart disease of native coronary artery without angina pectoris: Secondary | ICD-10-CM

## 2013-04-22 DIAGNOSIS — I10 Essential (primary) hypertension: Secondary | ICD-10-CM

## 2013-04-22 DIAGNOSIS — R0789 Other chest pain: Secondary | ICD-10-CM

## 2013-04-22 MED ORDER — ROSUVASTATIN CALCIUM 10 MG PO TABS: 10.0000 mg | ORAL_TABLET | Freq: Every day | ORAL | Status: DC

## 2013-04-22 MED ORDER — CARVEDILOL 25 MG PO TABS: 25.0000 mg | ORAL_TABLET | Freq: Two times a day (BID) | ORAL | Status: DC

## 2013-04-22 NOTE — Patient Instructions (Addendum)
Make the following changes to your medications: Stop Metoprolol.  Start Carvedilol 25 mg twice daily.  Start Crestor 10 mg at bed time for high cholesterol.   Labs in 6 weeks.   Your physician wants you to follow-up in: 6 months.  You will receive a reminder letter in the mail two months in advance. If you don't receive a letter, please call our office to schedule the follow-up appointment.

## 2013-04-22 NOTE — Progress Notes (Signed)
HPI  This is a 53 year old man who is here today for a followup visit. He presented in August, 2013  to [facility] with a small non-ST elevation myocardial infarction. He underwent cardiac catheterization which showed severe single-vessel coronary artery disease in the left anterior descending artery. There was a 60% proximal stenosis followed by diffuse 80% disease in the midsegment and a 70% discrete stenosis distally. He underwent an angioplasty and long drug-eluting stent placement to the proximal and mid LAD without complications. The distal LAD lesion was left to be treated medically.  He had recurrent atypical chest pain since then with 1 hospitalization. He did have symptoms suggestive of GERD as well as musculoskeletal chest pain. He didn't have myalgia on atorvastatin which was subsequently discontinued. He has been doing reasonably well. He denies dyspnea. He is trying to exercise more. His son received successful liver transplant. Blood pressure has been running high at home. He is trying to start a new relationship but is having difficulty with erectile dysfunction. He thinks it might be related to medications.    No Known Allergies   Current Outpatient Prescriptions on File Prior to Visit  Medication Sig Dispense Refill  . ALPRAZolam (XANAX) 0.25 MG tablet Take 0.25 mg by mouth 2 (two) times daily as needed for anxiety.      Marland Kitchen amLODipine (NORVASC) 10 MG tablet take 1 tablet by mouth daily  30 tablet  3  . aspirin EC 81 MG tablet Take 81 mg by mouth daily.      . clopidogrel (PLAVIX) 75 MG tablet take 1 tablet by mouth daily  30 tablet  3  . lisinopril (PRINIVIL,ZESTRIL) 40 MG tablet take 1 tablet by mouth daily  30 tablet  3  . metoprolol (LOPRESSOR) 100 MG tablet Take 1 tablet (100 mg total) by mouth 2 (two) times daily.  60 tablet  3  . nitroGLYCERIN (NITROSTAT) 0.4 MG SL tablet Place 0.4 mg under the tongue every 5 (five) minutes as needed for chest pain.       . pantoprazole (PROTONIX)  40 MG tablet Take 1 tablet (40 mg total) by mouth daily.  30 tablet  6   No current facility-administered medications on file prior to visit.     Past Medical History  Diagnosis Date  . Anxiety   . Obesity   . Hypokalemia   . Coronary artery disease     a. 02/2012 NSTEMI/Cath/PCI: LAD: 60% proximal, 80% diffuse mid & 70d, nonobs LCX/RCA --> DES to the prox/mid LAD.  Marland Kitchen Hyperlipidemia   . Hypertension      Past Surgical History  Procedure Laterality Date  . Cardiac catheterization  03/12/2012  . Coronary angioplasty  03/12/2012    s/p drug eluting stent to the mid LAD : 3.5 x 38 mm Xience drug-eluting stent. Post dilated to 4.0 in mid segment and 4.5 proximally.     Family History  Problem Relation Age of Onset  . Heart disease Mother   . Hyperlipidemia Mother   . Hypertension Mother   . Heart attack Mother 48    CABG X 4; past at age 34  . Heart disease Sister 5    s/p stents      History   Social History  . Marital Status: Widowed    Spouse Name: N/A    Number of Children: N/A  . Years of Education: N/A   Occupational History  . Not on file.   Social History Main Topics  . Smoking status:  Never Smoker   . Smokeless tobacco: Not on file  . Alcohol Use: 1.2 oz/week    2 Cans of beer per week     Comment: weekly  . Drug Use: No  . Sexual Activity:    Other Topics Concern  . Not on file   Social History Narrative   Lives locally with his son, who has a h/o liver failure s/p prior liver transplant.  He is pending repeat transplant.      PHYSICAL EXAM   BP 145/104  Pulse 95  Ht 5\' 10"  (1.778 m)  Wt 302 lb 8 oz (137.213 kg)  BMI 43.4 kg/m2 Constitutional: He is oriented to person, place, and time. He appears well-developed and well-nourished. No distress.  HENT: No nasal discharge.  Head: Normocephalic and atraumatic.  Eyes: Pupils are equal and round. Right eye exhibits no discharge. Left eye exhibits no discharge.  Neck: Normal range of motion.  Neck supple. No JVD present. No thyromegaly present.  Cardiovascular: Normal rate, regular rhythm, normal heart sounds and. Exam reveals no gallop and no friction rub. No murmur heard.  Pulmonary/Chest: Effort normal and breath sounds normal. No stridor. No respiratory distress. He has no wheezes. He has no rales. He exhibits no tenderness.  Abdominal: Soft. Bowel sounds are normal. He exhibits no distension. There is no tenderness. There is no rebound and no guarding.  Musculoskeletal: Normal range of motion. He exhibits no edema and no tenderness.  Neurological: He is alert and oriented to person, place, and time. Coordination normal.  Skin: Skin is warm and dry. No rash noted. He is not diaphoretic. No erythema. No pallor.  Psychiatric: He has a normal mood and affect. His behavior is normal. Judgment and thought content normal.      EKG: Sinus  Rhythm  WITHIN NORMAL LIMITS   ASSESSMENT AND PLAN

## 2013-04-22 NOTE — Assessment & Plan Note (Signed)
He did not tolerate atorvastatin due to myalgia. I will start treatment with Crestor 10 mg daily. Check fasting lipid and liver profile in 6 weeks.

## 2013-04-22 NOTE — Assessment & Plan Note (Signed)
Blood pressure has been running high. I switched metoprolol to carvedilol which tends to control blood pressure better with less sexual side effects. If he continues to have erectile dysfunction, he might need treatment with a phosphodiesterase inhibitor.

## 2013-04-22 NOTE — Assessment & Plan Note (Signed)
No convincing symptoms of angina. Continue medical therapy. Continue dual antiplatelet therapy for now given that she had a very long drug-eluting stent extending into the proximal LAD. There is the option of stopping Plavix now but I prefer to keep him on it longer.

## 2013-04-28 ENCOUNTER — Telehealth: Payer: Self-pay | Admitting: *Deleted

## 2013-04-28 NOTE — Telephone Encounter (Signed)
Pt needing prior authorization for Crestor 10 mg daily. Faxed prior authorization with Dr. Kirke Corin sign. Today to [facility] [phone]. Awaiting approval.

## 2013-04-30 ENCOUNTER — Telehealth: Payer: Self-pay | Admitting: *Deleted

## 2013-04-30 NOTE — Telephone Encounter (Signed)
Pt has been approved for Crestor 10 mg tablet once daily 04/28/13-04/30/15.

## 2013-05-05 ENCOUNTER — Other Ambulatory Visit: Payer: Self-pay | Admitting: *Deleted

## 2013-05-05 ENCOUNTER — Other Ambulatory Visit: Payer: Self-pay | Admitting: Cardiovascular Disease

## 2013-05-05 MED ORDER — CLOPIDOGREL BISULFATE 75 MG PO TABS: ORAL_TABLET | ORAL | Status: DC

## 2013-05-05 NOTE — Telephone Encounter (Signed)
Refilled Clopidogrel sent to [facility].

## 2013-05-29 ENCOUNTER — Other Ambulatory Visit: Payer: Self-pay

## 2013-06-03 ENCOUNTER — Other Ambulatory Visit: Payer: BC Managed Care – PPO

## 2013-06-05 ENCOUNTER — Other Ambulatory Visit: Payer: Self-pay | Admitting: Cardiovascular Disease

## 2013-08-01 ENCOUNTER — Other Ambulatory Visit: Payer: BC Managed Care – PPO

## 2013-09-17 ENCOUNTER — Other Ambulatory Visit: Payer: Self-pay | Admitting: Cardiovascular Disease

## 2013-11-14 ENCOUNTER — Other Ambulatory Visit: Payer: Self-pay

## 2013-11-14 ENCOUNTER — Other Ambulatory Visit: Payer: Self-pay | Admitting: Cardiovascular Disease

## 2013-11-14 ENCOUNTER — Ambulatory Visit (INDEPENDENT_AMBULATORY_CARE_PROVIDER_SITE_OTHER): Payer: BC Managed Care – PPO | Admitting: Cardiovascular Disease

## 2013-11-14 ENCOUNTER — Encounter: Payer: Self-pay | Admitting: Cardiovascular Disease

## 2013-11-14 VITALS — BP 180/130 | HR 74 | Ht 70.0 in | Wt 323.5 lb

## 2013-11-14 DIAGNOSIS — I2510 Atherosclerotic heart disease of native coronary artery without angina pectoris: Secondary | ICD-10-CM

## 2013-11-14 DIAGNOSIS — I10 Essential (primary) hypertension: Secondary | ICD-10-CM

## 2013-11-14 DIAGNOSIS — E785 Hyperlipidemia, unspecified: Secondary | ICD-10-CM

## 2013-11-14 MED ORDER — PANTOPRAZOLE SODIUM 40 MG PO TBEC: 40.0000 mg | DELAYED_RELEASE_TABLET | Freq: Every day | ORAL | Status: DC

## 2013-11-14 MED ORDER — ROSUVASTATIN CALCIUM 10 MG PO TABS: 5.0000 mg | ORAL_TABLET | Freq: Every day | ORAL | Status: DC

## 2013-11-14 MED ORDER — CARVEDILOL 25 MG PO TABS: 25.0000 mg | ORAL_TABLET | Freq: Two times a day (BID) | ORAL | Status: DC

## 2013-11-14 MED ORDER — CLOPIDOGREL BISULFATE 75 MG PO TABS: ORAL_TABLET | ORAL | Status: DC

## 2013-11-14 MED ORDER — LISINOPRIL 40 MG PO TABS: ORAL_TABLET | ORAL | Status: DC

## 2013-11-14 MED ORDER — AMLODIPINE BESYLATE 10 MG PO TABS: ORAL_TABLET | ORAL | Status: DC

## 2013-11-14 NOTE — Telephone Encounter (Signed)
Refill sent for plavix 75 mg 

## 2013-11-14 NOTE — Patient Instructions (Addendum)
Your physician has recommended you make the following change in your medication:  Increase Coreg to 25 mg twice daily Restart Crestor at 5 mg once daily (1/2 tablet) Your Lisinopril and Protonix have been reordered   Your physician recommends that you schedule a follow-up appointment in:  2 months   Your physician recommends that you return for lab work in:  Fasting labs at your 2 month follow up appt

## 2013-11-14 NOTE — Telephone Encounter (Signed)
Refill sent for lisinopril  

## 2013-11-14 NOTE — Telephone Encounter (Signed)
Refill sent for amlodipine.  

## 2013-11-14 NOTE — Progress Notes (Signed)
HPI  This is a 53 year old man who is here today for a followup visit. He presented in August, 2013  to [facility] with a small non-ST elevation myocardial infarction. He underwent cardiac catheterization which showed severe single-vessel coronary artery disease in the left anterior descending artery. There was a 60% proximal stenosis followed by diffuse 80% disease in the midsegment and a 70% discrete stenosis distally. He underwent an angioplasty and long drug-eluting stent placement to the proximal and mid LAD without complications. The distal LAD lesion was left to be treated medically.   He did have myalgia on atorvastatin which was subsequently discontinued.  Unfortunately, he has not been much attention to his health and medical issues. He claims to be extremely busy at work. He has not been taking his blood pressure medications regularly. He takes Coreg only once a day instead of twice daily. He also ran out of of lisinopril few days ago. He denies chest pain or dyspnea. He was started on Crestor for hyperlipidemia but only took it for a brief period of time because he could not keep his appointment for followup labs.    Allergies  Allergen Reactions  . Atorvastatin      Current Outpatient Prescriptions on File Prior to Visit  Medication Sig Dispense Refill  . aspirin EC 81 MG tablet Take 81 mg by mouth daily.      . clopidogrel (PLAVIX) 75 MG tablet take 1 tablet by mouth once daily  30 tablet  3  . nitroGLYCERIN (NITROSTAT) 0.4 MG SL tablet Place 0.4 mg under the tongue every 5 (five) minutes as needed for chest pain.        No current facility-administered medications on file prior to visit.     Past Medical History  Diagnosis Date  . Anxiety   . Obesity   . Hypokalemia   . Coronary artery disease     a. 02/2012 NSTEMI/Cath/PCI: LAD: 60% proximal, 80% diffuse mid & 70d, nonobs LCX/RCA --> DES to the prox/mid LAD.  Marland Kitchen. Hyperlipidemia   . Hypertension      Past Surgical History    Procedure Laterality Date  . Cardiac catheterization  03/12/2012  . Coronary angioplasty  03/12/2012    s/p drug eluting stent to the mid LAD : 3.5 x 38 mm Xience drug-eluting stent. Post dilated to 4.0 in mid segment and 4.5 proximally.     Family History  Problem Relation Age of Onset  . Heart disease Mother   . Hyperlipidemia Mother   . Hypertension Mother   . Heart attack Mother 4954    CABG X 4; past at age 51  . Heart disease Sister [id number]    s/p stents      History   Social History  . Marital Status: Widowed    Spouse Name: N/A    Number of Children: N/A  . Years of Education: N/A   Occupational History  . Not on file.   Social History Main Topics  . Smoking status: Never Smoker   . Smokeless tobacco: Not on file  . Alcohol Use: 1.2 oz/week    2 Cans of beer per week     Comment: weekly  . Drug Use: No  . Sexual Activity:    Other Topics Concern  . Not on file   Social History Narrative   Lives locally with his son, who has a h/o liver failure s/p prior liver transplant.  He is pending repeat transplant.  PHYSICAL EXAM   BP 180/130  Pulse 74  Ht 5\' 10"  (1.778 m)  Wt 323 lb 8 oz (146.739 kg)  BMI 46.42 kg/m2 Constitutional: He is oriented to person, place, and time. He appears well-developed and well-nourished. No distress.  HENT: No nasal discharge.  Head: Normocephalic and atraumatic.  Eyes: Pupils are equal and round. Right eye exhibits no discharge. Left eye exhibits no discharge.  Neck: Normal range of motion. Neck supple. No JVD present. No thyromegaly present.  Cardiovascular: Normal rate, regular rhythm, normal heart sounds and. Exam reveals no gallop and no friction rub. No murmur heard.  Pulmonary/Chest: Effort normal and breath sounds normal. No stridor. No respiratory distress. He has no wheezes. He has no rales. He exhibits no tenderness.  Abdominal: Soft. Bowel sounds are normal. He exhibits no distension. There is no tenderness. There  is no rebound and no guarding.  Musculoskeletal: Normal range of motion. He exhibits no edema and no tenderness.  Neurological: He is alert and oriented to person, place, and time. Coordination normal.  Skin: Skin is warm and dry. No rash noted. He is not diaphoretic. No erythema. No pallor.  Psychiatric: He has a normal mood and affect. His behavior is normal. Judgment and thought content normal.      EKG: Sinus  Rhythm  WITHIN NORMAL LIMITS   ASSESSMENT AND PLAN

## 2013-11-14 NOTE — Telephone Encounter (Signed)
Refill sent for pantoprazole 40 mg  

## 2013-11-16 NOTE — Assessment & Plan Note (Signed)
He did not tolerate atorvastatin due to myalgia. He only took Crestor for a few days. He had less myalgia with that. I resumed Crestor at 5 mg once daily. We'll get fasting lipid and liver profile in 2 months.

## 2013-11-16 NOTE — Assessment & Plan Note (Signed)
Attempted weight loss was advised.

## 2013-11-16 NOTE — Assessment & Plan Note (Signed)
Blood pressure was extremely high. I asked him to start taking Coreg twice daily instead of once daily. I also resumed lisinopril. Continue on amlodipine. Check basic metabolic profile upon followup. His obesity is likely contributing to control hypertension. He probably has sleep apnea as well.

## 2013-11-16 NOTE — Assessment & Plan Note (Signed)
I had a prolonged and frank discussion with the patient about the dangers of not taking medications regularly and controlling his risk factors. His blood pressure is extremely high which puts him at significant risk of cardiovascular complications. This was discussed with him extensively. He is not having symptoms of angina. Continue dual antiplatelet therapy and blood pressure control.

## 2013-11-17 ENCOUNTER — Other Ambulatory Visit: Payer: Self-pay | Admitting: *Deleted

## 2013-11-17 MED ORDER — LISINOPRIL 40 MG PO TABS: ORAL_TABLET | ORAL | Status: DC

## 2013-11-17 MED ORDER — CLOPIDOGREL BISULFATE 75 MG PO TABS: ORAL_TABLET | ORAL | Status: DC

## 2013-11-17 MED ORDER — AMLODIPINE BESYLATE 10 MG PO TABS: ORAL_TABLET | ORAL | Status: DC

## 2013-11-17 NOTE — Telephone Encounter (Signed)
Requested Prescriptions   Signed Prescriptions Disp Refills  . amLODipine (NORVASC) 10 MG tablet 90 tablet 3    Sig: take 1 tablet by mouth once daily    Authorizing Provider: Lorine BearsARIDA, MUHAMMAD A    Ordering User: Masiel Gentzler C  . lisinopril (PRINIVIL,ZESTRIL) 40 MG tablet 90 tablet 3    Sig: take 1 tablet by mouth once daily    Authorizing Provider: Lorine BearsARIDA, MUHAMMAD A    Ordering User: Maclin Guerrette C  . clopidogrel (PLAVIX) 75 MG tablet 90 tablet 3    Sig: take 1 tablet by mouth once daily    Authorizing Provider: Lorine BearsARIDA, MUHAMMAD A    Ordering User: Kendrick FriesLOPEZ, Damante Spragg C

## 2014-01-15 ENCOUNTER — Ambulatory Visit (INDEPENDENT_AMBULATORY_CARE_PROVIDER_SITE_OTHER): Payer: BC Managed Care – PPO | Admitting: Cardiovascular Disease

## 2014-01-15 ENCOUNTER — Encounter: Payer: Self-pay | Admitting: Cardiovascular Disease

## 2014-01-15 ENCOUNTER — Ambulatory Visit (INDEPENDENT_AMBULATORY_CARE_PROVIDER_SITE_OTHER): Payer: BC Managed Care – PPO | Admitting: *Deleted

## 2014-01-15 VITALS — BP 130/88 | HR 80 | Ht 70.0 in | Wt 329.5 lb

## 2014-01-15 DIAGNOSIS — I25119 Atherosclerotic heart disease of native coronary artery with unspecified angina pectoris: Secondary | ICD-10-CM

## 2014-01-15 DIAGNOSIS — E785 Hyperlipidemia, unspecified: Secondary | ICD-10-CM

## 2014-01-15 DIAGNOSIS — I2510 Atherosclerotic heart disease of native coronary artery without angina pectoris: Secondary | ICD-10-CM

## 2014-01-15 DIAGNOSIS — I209 Angina pectoris, unspecified: Secondary | ICD-10-CM

## 2014-01-15 DIAGNOSIS — I10 Essential (primary) hypertension: Secondary | ICD-10-CM

## 2014-01-15 NOTE — Progress Notes (Signed)
HPI  This is a 53 year old man who is here today for a followup visit. He presented in August, 2013  to [facility] with a small non-ST elevation myocardial infarction. He underwent cardiac catheterization which showed severe single-vessel coronary artery disease in the left anterior descending artery. There was a 60% proximal stenosis followed by diffuse 80% disease in the midsegment and a 70% discrete stenosis distally. He underwent an angioplasty and long drug-eluting stent placement to the proximal and mid LAD without complications. The distal LAD lesion was left to be treated medically.   He did have myalgia on atorvastatin which was subsequently discontinued.  He was seen 2 months ago with worsening hypertension due to poor compliance with medications. His blood pressure was 180/130. His medications were refilled. He was also instructed to restart Crestor .  He has been doing well since then and has been taking his medications. He denies chest pain or significant dyspnea.   Allergies  Allergen Reactions  . Atorvastatin      Current Outpatient Prescriptions on File Prior to Visit  Medication Sig Dispense Refill  . amLODipine (NORVASC) 10 MG tablet take 1 tablet by mouth once daily  90 tablet  3  . aspirin EC 81 MG tablet Take 81 mg by mouth daily.      . carvedilol (COREG) 25 MG tablet Take 1 tablet (25 mg total) by mouth 2 (two) times daily.  180 tablet  3  . clopidogrel (PLAVIX) 75 MG tablet take 1 tablet by mouth once daily  90 tablet  3  . lisinopril (PRINIVIL,ZESTRIL) 40 MG tablet take 1 tablet by mouth once daily  90 tablet  3  . nitroGLYCERIN (NITROSTAT) 0.4 MG SL tablet Place 0.4 mg under the tongue every 5 (five) minutes as needed for chest pain.       . pantoprazole (PROTONIX) 40 MG tablet Take 1 tablet (40 mg total) by mouth daily.  90 tablet  3   No current facility-administered medications on file prior to visit.     Past Medical History  Diagnosis Date  . Anxiety   .  Obesity   . Hypokalemia   . Coronary artery disease     a. 02/2012 NSTEMI/Cath/PCI: LAD: 60% proximal, 80% diffuse mid & 70d, nonobs LCX/RCA --> DES to the prox/mid LAD.  Marland Kitchen. Hyperlipidemia   . Hypertension      Past Surgical History  Procedure Laterality Date  . Cardiac catheterization  03/12/2012  . Coronary angioplasty  03/12/2012    s/p drug eluting stent to the mid LAD : 3.5 x 38 mm Xience drug-eluting stent. Post dilated to 4.0 in mid segment and 4.5 proximally.     Family History  Problem Relation Age of Onset  . Heart disease Mother   . Hyperlipidemia Mother   . Hypertension Mother   . Heart attack Mother 3654    CABG X 4; past at age 10578  . Heart disease Sister [id number]    s/p stents      History   Social History  . Marital Status: Widowed    Spouse Name: N/A    Number of Children: N/A  . Years of Education: N/A   Occupational History  . Not on file.   Social History Main Topics  . Smoking status: Never Smoker   . Smokeless tobacco: Not on file  . Alcohol Use: 1.2 oz/week    2 Cans of beer per week     Comment: weekly  . Drug Use:  No  . Sexual Activity:    Other Topics Concern  . Not on file   Social History Narrative   Lives locally with his son, who has a h/o liver failure s/p prior liver transplant.  He is pending repeat transplant.      PHYSICAL EXAM   BP 130/88  Pulse 80  Ht 5\' 10"  (1.778 m)  Wt 329 lb 8 oz (149.46 kg)  BMI 47.28 kg/m2 Constitutional: He is oriented to person, place, and time. He appears well-developed and well-nourished. No distress.  HENT: No nasal discharge.  Head: Normocephalic and atraumatic.  Eyes: Pupils are equal and round. Right eye exhibits no discharge. Left eye exhibits no discharge.  Neck: Normal range of motion. Neck supple. No JVD present. No thyromegaly present.  Cardiovascular: Normal rate, regular rhythm, normal heart sounds and. Exam reveals no gallop and no friction rub. No murmur heard.  Pulmonary/Chest:  Effort normal and breath sounds normal. No stridor. No respiratory distress. He has no wheezes. He has no rales. He exhibits no tenderness.  Abdominal: Soft. Bowel sounds are normal. He exhibits no distension. There is no tenderness. There is no rebound and no guarding.  Musculoskeletal: Normal range of motion. He exhibits trace edema and no tenderness.  Neurological: He is alert and oriented to person, place, and time. Coordination normal.  Skin: Skin is warm and dry. No rash noted. He is not diaphoretic. No erythema. No pallor.  Psychiatric: He has a normal mood and affect. His behavior is normal. Judgment and thought content normal.      ASSESSMENT AND PLAN

## 2014-01-15 NOTE — Patient Instructions (Signed)
Continue same medications.   Your physician wants you to follow-up in: 6 months.  You will receive a reminder letter in the mail two months in advance. If you don't receive a letter, please call our office to schedule the follow-up appointment.  

## 2014-01-16 LAB — HEPATIC FUNCTION PANEL
ALBUMIN: 4.2 g/dL (ref 3.5–5.5)
ALT: 23 IU/L (ref 0–44)
AST: 21 IU/L (ref 0–40)
Alkaline Phosphatase: 80 IU/L (ref 39–117)
Bilirubin, Direct: 0.12 mg/dL (ref 0.00–0.40)
TOTAL PROTEIN: 6.6 g/dL (ref 6.0–8.5)
Total Bilirubin: 0.4 mg/dL (ref 0.0–1.2)

## 2014-01-16 LAB — LIPID PANEL
CHOL/HDL RATIO: 3.6 ratio (ref 0.0–5.0)
Cholesterol, Total: 149 mg/dL (ref 100–199)
HDL: 41 mg/dL (ref >39)
LDL CALC: 83 mg/dL (ref 0–99)
Triglycerides: 123 mg/dL (ref 0–149)
VLDL Cholesterol Cal: 25 mg/dL (ref 5–40)

## 2014-01-16 NOTE — Assessment & Plan Note (Signed)
Blood pressure improved after resuming his medications.

## 2014-01-16 NOTE — Assessment & Plan Note (Signed)
We discussed the importance of weight loss. 

## 2014-01-16 NOTE — Assessment & Plan Note (Signed)
Continue treatment with Crestor. Check fasting lipid and liver profile today.

## 2014-01-16 NOTE — Assessment & Plan Note (Signed)
He is currently doing well and has no symptoms suggestive of angina. Continue medical therapy.

## 2014-02-02 ENCOUNTER — Telehealth: Payer: Self-pay | Admitting: Cardiovascular Disease

## 2014-02-02 ENCOUNTER — Emergency Department: Payer: Self-pay | Admitting: Emergency Medicine

## 2014-02-02 DIAGNOSIS — Z7689 Persons encountering health services in other specified circumstances: Secondary | ICD-10-CM

## 2014-02-02 LAB — BASIC METABOLIC PANEL
Anion Gap: 8 (ref 7–16)
BUN: 16 mg/dL (ref 7–18)
Calcium, Total: 8.5 mg/dL (ref 8.5–10.1)
Chloride: 105 mmol/L (ref 98–107)
Co2: 25 mmol/L (ref 21–32)
Creatinine: 0.83 mg/dL (ref 0.60–1.30)
EGFR (African American): >60
EGFR (Non-African Amer.): >60
GLUCOSE: 114 mg/dL — AB (ref 65–99)
Osmolality: 278 (ref 275–301)
Potassium: 3.9 mmol/L (ref 3.5–5.1)
Sodium: 138 mmol/L (ref 136–145)

## 2014-02-02 LAB — CBC
HCT: 41.1 % (ref 40.0–52.0)
HGB: 13.4 g/dL (ref 13.0–18.0)
MCH: 26 pg (ref 26.0–34.0)
MCHC: 32.5 g/dL (ref 32.0–36.0)
MCV: 80 fL (ref 80–100)
Platelet: 233 10*3/uL (ref 150–440)
RBC: 5.14 10*6/uL (ref 4.40–5.90)
RDW: 15.7 % — ABNORMAL HIGH (ref 11.5–14.5)
WBC: 6.2 10*3/uL (ref 3.8–10.6)

## 2014-02-02 LAB — PRO B NATRIURETIC PEPTIDE: B-Type Natriuretic Peptide: 22 pg/mL (ref 0–125)

## 2014-02-02 LAB — TROPONIN I

## 2014-02-02 NOTE — Telephone Encounter (Signed)
Yes, [facility].

## 2014-02-02 NOTE — Telephone Encounter (Signed)
Pt is wanting doctor to refer them to a pcp that is in our system. Pt was in ER today. Was told to see pcp but has none. Please advice

## 2014-02-03 NOTE — Telephone Encounter (Signed)
Appt made with Orville Governaquel Rey NP for February 10, 2014 at 0815  Patient is aware

## 2014-02-10 ENCOUNTER — Ambulatory Visit: Payer: BC Managed Care – PPO | Admitting: Adult Health

## 2014-02-16 ENCOUNTER — Encounter: Payer: Self-pay | Admitting: Cardiovascular Disease

## 2014-02-16 ENCOUNTER — Telehealth: Payer: Self-pay

## 2014-02-16 ENCOUNTER — Telehealth: Payer: Self-pay | Admitting: *Deleted

## 2014-02-16 MED ORDER — ALPRAZOLAM 0.5 MG PO TABS
0.5000 mg | ORAL_TABLET | Freq: Every evening | ORAL | Status: DC | PRN
Start: 2014-02-16 — End: 2014-02-27

## 2014-02-16 NOTE — Telephone Encounter (Signed)
Error

## 2014-02-16 NOTE — Telephone Encounter (Signed)
Scheduled patient to see Dr. Sherley BoundsSundaram 02/25/14 at 2:45 pm  Patient aware

## 2014-02-16 NOTE — Telephone Encounter (Signed)
Xanax 0.5 mg at bedtime ordered per Dr. Kirke CorinArida  Instructed patient not to drive or use machinery while on this medication  Rx left at front desk for patient pick up

## 2014-02-16 NOTE — Telephone Encounter (Signed)
Pt called states he has trouble breathing at night, [facility], but appt cx due to [employer] Rey leaving. Wants to know what to do, cannot wait until Aug, when [facility]. Please call.

## 2014-02-17 ENCOUNTER — Ambulatory Visit (INDEPENDENT_AMBULATORY_CARE_PROVIDER_SITE_OTHER): Payer: BC Managed Care – PPO | Admitting: Internal Medicine

## 2014-02-17 ENCOUNTER — Encounter: Payer: Self-pay | Admitting: Internal Medicine

## 2014-02-17 VITALS — BP 146/98 | HR 87 | Temp 98.3°F | Wt 338.8 lb

## 2014-02-17 DIAGNOSIS — F411 Generalized anxiety disorder: Secondary | ICD-10-CM

## 2014-02-17 DIAGNOSIS — G4700 Insomnia, unspecified: Secondary | ICD-10-CM

## 2014-02-17 DIAGNOSIS — F419 Anxiety disorder, unspecified: Secondary | ICD-10-CM

## 2014-02-17 MED ORDER — CITALOPRAM HYDROBROMIDE 20 MG PO TABS: 20.0000 mg | ORAL_TABLET | Freq: Every day | ORAL | Status: DC

## 2014-02-17 NOTE — Progress Notes (Signed)
Pre visit review using our clinic review tool, if applicable. No additional management support is needed unless otherwise documented below in the visit note. 

## 2014-02-17 NOTE — Assessment & Plan Note (Signed)
?   Sleep apnea secondary to morbid obesity Will refer to pulm for sleep study If he does not have OSA, will trial trazedone

## 2014-02-17 NOTE — Patient Instructions (Addendum)
Sleep Apnea  Sleep apnea is a sleep disorder characterized by abnormal pauses in breathing while you sleep. When your breathing pauses, the level of oxygen in your blood decreases. This causes you to move out of deep sleep and into light sleep. As a result, your quality of sleep is poor, and the system that carries your blood throughout your body (cardiovascular system) experiences stress. If sleep apnea remains untreated, the following conditions can develop:  High blood pressure (hypertension).  Coronary artery disease.  Inability to achieve or maintain an erection (impotence).  Impairment of your thought process (cognitive dysfunction). There are three types of sleep apnea: 1. Obstructive sleep apnea--Pauses in breathing during sleep because of a blocked airway. 2. Central sleep apnea--Pauses in breathing during sleep because the area of the brain that controls your breathing does not send the correct signals to the muscles that control breathing. 3. Mixed sleep apnea--A combination of both obstructive and central sleep apnea. RISK FACTORS The following risk factors can increase your risk of developing sleep apnea:  Being overweight.  Smoking.  Having narrow passages in your nose and throat.  Being of older age.  Being male.  Alcohol use.  Sedative and tranquilizer use.  Ethnicity. Among individuals younger than 35 years, African Americans are at increased risk of sleep apnea. SYMPTOMS   Difficulty staying asleep.  Daytime sleepiness and fatigue.  Loss of energy.  Irritability.  Loud, heavy snoring.  Morning headaches.  Trouble concentrating.  Forgetfulness.  Decreased interest in sex. DIAGNOSIS  In order to diagnose sleep apnea, your caregiver will perform a physical examination. Your caregiver may suggest that you take a home sleep test. Your caregiver may also recommend that you spend the night in a sleep lab. In the sleep lab, several monitors record  information about your heart, lungs, and brain while you sleep. Your leg and arm movements and blood oxygen level are also recorded. TREATMENT The following actions may help to resolve mild sleep apnea:  Sleeping on your side.   Using a decongestant if you have nasal congestion.   Avoiding the use of depressants, including alcohol, sedatives, and narcotics.   Losing weight and modifying your diet if you are overweight. There also are devices and treatments to help open your airway:  Oral appliances. These are custom-made mouthpieces that shift your lower jaw forward and slightly open your bite. This opens your airway.  Devices that create positive airway pressure. This positive pressure "splints" your airway open to help you breathe better during sleep. The following devices create positive airway pressure:  Continuous positive airway pressure (CPAP) device. The CPAP device creates a continuous level of air pressure with an air pump. The air is delivered to your airway through a mask while you sleep. This continuous pressure keeps your airway open.  Nasal expiratory positive airway pressure (EPAP) device. The EPAP device creates positive air pressure as you exhale. The device consists of single-use valves, which are inserted into each nostril and held in place by adhesive. The valves create very little resistance when you inhale but create much more resistance when you exhale. That increased resistance creates the positive airway pressure. This positive pressure while you exhale keeps your airway open, making it easier to breath when you inhale again.  Bilevel positive airway pressure (BPAP) device. The BPAP device is used mainly in patients with central sleep apnea. This device is similar to the CPAP device because it also uses an air pump to deliver continuous air pressure   through a mask. However, with the BPAP machine, the pressure is set at two different levels. The pressure when you  exhale is lower than the pressure when you inhale.  Surgery. Typically, surgery is only done if you cannot comply with less invasive treatments or if the less invasive treatments do not improve your condition. Surgery involves removing excess tissue in your airway to create a wider passage way. Document Released: 06/30/2002 Document Revised: 11/04/2012 Document Reviewed: 11/16/2011 ExitCare Patient Information 2015 [employer]. This information is not intended to replace advice given to you by your health care provider. Make sure you discuss any questions you have with your health care provider.  

## 2014-02-17 NOTE — Progress Notes (Signed)
Subjective:    Patient ID: Steve Burnett, male    DOB: 1961-06-29, 53 y.o.   MRN: [id number]  HPI  Pt presents to the clinic today with c/o insomnia. He reports this has been going on for the last 3 weeks. He has been having trouble falling and staying asleep. He wakes up every hour. He does snore at night. He feels tired most of the day. He was prescribed xanax by Dr. Kirke CorinArida but has not picked up that RX. He is concerned that he may have sleep apnea. He would like a sleep study. Additionally, he is having worsening anxiety. It seems to be worse at night. He does experience shortness of breath with these episodes. He does not feel very stressed out but he reports his son has been sick. He is very tearful when talking about this. He has been on ativan and wellbutrin in the past.   Review of Systems      Past Medical History  Diagnosis Date  . Anxiety   . Obesity   . Hypokalemia   . Coronary artery disease     a. 02/2012 NSTEMI/Cath/PCI: LAD: 60% proximal, 80% diffuse mid & 70d, nonobs LCX/RCA --> DES to the prox/mid LAD.  Marland Kitchen. Hyperlipidemia   . Hypertension     Current Outpatient Prescriptions  Medication Sig Dispense Refill  . ALPRAZolam (XANAX) 0.5 MG tablet Take 1 tablet (0.5 mg total) by mouth at bedtime as needed for anxiety.  10 tablet  0  . amLODipine (NORVASC) 10 MG tablet take 1 tablet by mouth once daily  90 tablet  3  . aspirin EC 81 MG tablet Take 81 mg by mouth daily.      . carvedilol (COREG) 25 MG tablet Take 1 tablet (25 mg total) by mouth 2 (two) times daily.  180 tablet  3  . clopidogrel (PLAVIX) 75 MG tablet take 1 tablet by mouth once daily  90 tablet  3  . lisinopril (PRINIVIL,ZESTRIL) 40 MG tablet take 1 tablet by mouth once daily  90 tablet  3  . nitroGLYCERIN (NITROSTAT) 0.4 MG SL tablet Place 0.4 mg under the tongue every 5 (five) minutes as needed for chest pain.       . pantoprazole (PROTONIX) 40 MG tablet Take 1 tablet (40 mg total) by mouth daily.  90  tablet  3  . rosuvastatin (CRESTOR) 10 MG tablet Take 10 mg by mouth daily.       No current facility-administered medications for this visit.    Allergies  Allergen Reactions  . Atorvastatin     Family History  Problem Relation Age of Onset  . Heart disease Mother   . Hyperlipidemia Mother   . Hypertension Mother   . Heart attack Mother [id number]    CABG X 4; past at age 51  . Heart disease Sister 6255    s/p stents     History   Social History  . Marital Status: Widowed    Spouse Name: N/A    Number of Children: N/A  . Years of Education: N/A   Occupational History  . Not on file.   Social History Main Topics  . Smoking status: Never Smoker   . Smokeless tobacco: Not on file  . Alcohol Use: 1.2 oz/week    2 Cans of beer per week     Comment: weekly  . Drug Use: No  . Sexual Activity: Not on file   Other Topics Concern  . Not on file  Social History Narrative   Lives locally with his son, who has a h/o liver failure s/p prior liver transplant.  He is pending repeat transplant.     Constitutional: Pt reports fatigue. Denies fever, malaise, headache or abrupt weight changes.  Respiratory: Pt reports shortness of breath. Denies difficulty breathing, cough or sputum production.   Cardiovascular: Denies chest pain, chest tightness, palpitations or swelling in the hands or feet.  Psych: Pt reports anxiety. Denies depression, SI/HI.  No other specific complaints in a complete review of systems (except as listed in HPI above).  Objective:   Physical Exam  BP 146/98  Pulse 87  Temp(Src) 98.3 F (36.8 C) (Oral)  Wt 338 lb 12 oz (153.656 kg)  SpO2 97% Wt Readings from Last 3 Encounters:  02/17/14 338 lb 12 oz (153.656 kg)  01/15/14 329 lb 8 oz (149.46 kg)  11/14/13 323 lb 8 oz (146.739 kg)    General: Appears his stated age, obese but well developed, well nourished in NAD. Cardiovascular: Normal rate and rhythm. S1,S2 noted.  No murmur, rubs or gallops noted. No  JVD or BLE edema. No carotid bruits noted. Pulmonary/Chest: Normal effort and positive vesicular breath sounds. No respiratory distress. No wheezes, rales or ronchi noted.  Psychiatric: Mood tearful and affect normal. Behavior is normal. Judgment and thought content normal.     BMET    Component Value Date/Time   NA 134* 12/06/2012 1248   K 3.2* 12/06/2012 1248   CL 98 12/06/2012 1248   CO2 25 12/06/2012 1248   GLUCOSE 119* 12/06/2012 1248   BUN 12 12/06/2012 1248   CREATININE 0.69 12/06/2012 1248   CALCIUM 8.9 12/06/2012 1248   GFRNONAA >90 12/06/2012 1248   GFRAA >90 12/06/2012 1248    Lipid Panel     Component Value Date/Time   TRIG 123 01/15/2014 0826   HDL 41 01/15/2014 0826   CHOLHDL 3.6 01/15/2014 0826   LDLCALC 83 01/15/2014 0826    CBC    Component Value Date/Time   WBC 6.9 12/06/2012 1248   RBC 5.40 12/06/2012 1248   HGB 13.6 12/06/2012 1248   HCT 40.5 12/06/2012 1248   PLT 292 12/06/2012 1248   MCV 75.0* 12/06/2012 1248   MCH 25.2* 12/06/2012 1248   MCHC 33.6 12/06/2012 1248   RDW 15.2 12/06/2012 1248   LYMPHSABS 1.4 12/06/2012 1248   MONOABS 0.4 12/06/2012 1248   EOSABS 0.2 12/06/2012 1248   BASOSABS 0.0 12/06/2012 1248    Hgb A1C No results found for this basename: HGBA1C         Assessment & Plan:

## 2014-02-17 NOTE — Assessment & Plan Note (Signed)
Support offered today Will start Celexa with supplemental xanax (already has RX for Dr. Kirke CorinArida)  RTC in 4-6 weeks to reevaluate

## 2014-02-27 ENCOUNTER — Encounter: Payer: Self-pay | Admitting: Internal Medicine

## 2014-02-27 MED ORDER — ALPRAZOLAM 0.5 MG PO TABS: 0.5000 mg | ORAL_TABLET | Freq: Every evening | ORAL | Status: DC | PRN

## 2014-02-27 NOTE — Telephone Encounter (Signed)
Please phone in xanax 

## 2014-02-27 NOTE — Telephone Encounter (Signed)
Rx called in as directed.   

## 2014-03-03 ENCOUNTER — Ambulatory Visit (INDEPENDENT_AMBULATORY_CARE_PROVIDER_SITE_OTHER): Payer: BC Managed Care – PPO | Admitting: Internal Medicine

## 2014-03-03 ENCOUNTER — Encounter: Payer: Self-pay | Admitting: Internal Medicine

## 2014-03-03 VITALS — BP 144/92 | HR 76 | Temp 98.2°F | Ht 69.5 in | Wt 336.0 lb

## 2014-03-03 DIAGNOSIS — I2510 Atherosclerotic heart disease of native coronary artery without angina pectoris: Secondary | ICD-10-CM

## 2014-03-03 DIAGNOSIS — F419 Anxiety disorder, unspecified: Secondary | ICD-10-CM

## 2014-03-03 DIAGNOSIS — I10 Essential (primary) hypertension: Secondary | ICD-10-CM

## 2014-03-03 DIAGNOSIS — I25119 Atherosclerotic heart disease of native coronary artery with unspecified angina pectoris: Secondary | ICD-10-CM

## 2014-03-03 DIAGNOSIS — E785 Hyperlipidemia, unspecified: Secondary | ICD-10-CM

## 2014-03-03 DIAGNOSIS — I209 Angina pectoris, unspecified: Secondary | ICD-10-CM

## 2014-03-03 DIAGNOSIS — G4700 Insomnia, unspecified: Secondary | ICD-10-CM

## 2014-03-03 DIAGNOSIS — F411 Generalized anxiety disorder: Secondary | ICD-10-CM

## 2014-03-03 NOTE — Assessment & Plan Note (Signed)
No current issues Does not have to use nitro Continue carvedilol, asa, plavix and statin

## 2014-03-03 NOTE — Assessment & Plan Note (Signed)
Improved on Celexa Will continue for now

## 2014-03-03 NOTE — Assessment & Plan Note (Signed)
Well controlled on Crestor Labs from 6/15 reviewed

## 2014-03-03 NOTE — Assessment & Plan Note (Signed)
Controlled on norvasc and lisinopril

## 2014-03-03 NOTE — Progress Notes (Signed)
HPI  Pt presents to the clinic today to establish care. He has been seeing Dr. Kirke CorinArida who advised him to get a PCP. He reports that he has not had a PCP in many years.  Flu: 2014 Tetanus: less than 10 years ago PSA Screen: never Colon Screen: never Eye Doctor: as needed Dentist: as needed  Anxiety: Has already noticed some improvement after taking the Celexa for only 2 weeks. He reports that he knows the source of anxiety and is working through it on his own.  Insomnia: Taking half a xanax at night. Is planning on weaning off that after he gets the results of the sleep study. He reports he has an appt with Dr. Craige CottaSood 03/27/14.  CAD s/p NSTEMI and stent placement: On carvedilol, statin, asa and plavix. Follows with Dr. Kirke CorinArida.  HTN: Well controlled on Norvasc and Lisinopril.  HLD: denies myalgias on Crestor.    Past Medical History  Diagnosis Date  . Anxiety   . Obesity   . Hypokalemia   . Coronary artery disease     a. 02/2012 NSTEMI/Cath/PCI: LAD: 60% proximal, 80% diffuse mid & 70d, nonobs LCX/RCA --> DES to the prox/mid LAD.  Marland Kitchen. Hyperlipidemia   . Hypertension   . Chicken pox     Current Outpatient Prescriptions  Medication Sig Dispense Refill  . ALPRAZolam (XANAX) 0.5 MG tablet Take 1 tablet (0.5 mg total) by mouth at bedtime as needed for anxiety.  30 tablet  0  . amLODipine (NORVASC) 10 MG tablet take 1 tablet by mouth once daily  90 tablet  3  . aspirin EC 81 MG tablet Take 81 mg by mouth daily.      . carvedilol (COREG) 25 MG tablet Take 1 tablet (25 mg total) by mouth 2 (two) times daily.  180 tablet  3  . citalopram (CELEXA) 20 MG tablet Take 1 tablet (20 mg total) by mouth daily.  30 tablet  3  . clopidogrel (PLAVIX) 75 MG tablet take 1 tablet by mouth once daily  90 tablet  3  . lisinopril (PRINIVIL,ZESTRIL) 40 MG tablet take 1 tablet by mouth once daily  90 tablet  3  . nitroGLYCERIN (NITROSTAT) 0.4 MG SL tablet Place 0.4 mg under the tongue every 5 (five) minutes as  needed for chest pain.       . pantoprazole (PROTONIX) 40 MG tablet Take 1 tablet (40 mg total) by mouth daily.  90 tablet  3  . rosuvastatin (CRESTOR) 10 MG tablet Take 10 mg by mouth daily.       No current facility-administered medications for this visit.    Allergies  Allergen Reactions  . Atorvastatin Other (See Comments)    Muscle cramps    Family History  Problem Relation Age of Onset  . Heart disease Mother   . Hyperlipidemia Mother   . Hypertension Mother   . Heart attack Mother 454    CABG X 4; past at age 51  . Heart disease Sister 255    s/p stents   . Stroke Father     History   Social History  . Marital Status: Widowed    Spouse Name: N/A    Number of Children: N/A  . Years of Education: N/A   Occupational History  . Not on file.   Social History Main Topics  . Smoking status: Never Smoker   . Smokeless tobacco: Not on file  . Alcohol Use: 1.2 oz/week    2 Cans of beer  per week     Comment: weekly  . Drug Use: No  . Sexual Activity: Not on file   Other Topics Concern  . Not on file   Social History Narrative   Lives locally with his son, who has a h/o liver failure s/p prior liver transplant.  He is pending repeat transplant.    ROS:  Constitutional: Denies fever, malaise, fatigue, headache or abrupt weight changes.  Respiratory: Denies difficulty breathing, shortness of breath, cough or sputum production.   Cardiovascular: Denies chest pain, chest tightness, palpitations or swelling in the hands or feet.   Neurological: Denies dizziness, difficulty with memory, difficulty with speech or problems with balance and coordination.  Psych: Pt reports anxiety. Denies depression, SI/HI.  No other specific complaints in a complete review of systems (except as listed in HPI above).  PE:  BP 144/92  Pulse 76  Temp(Src) 98.2 F (36.8 C) (Oral)  Ht 5' 9.5" (1.765 m)  Wt 336 lb (152.409 kg)  BMI 48.92 kg/m2  SpO2 97% Wt Readings from Last 3  Encounters:  03/03/14 336 lb (152.409 kg)  02/17/14 338 lb 12 oz (153.656 kg)  01/15/14 329 lb 8 oz (149.46 kg)    General: Appears his stated age, obese but well developed, well nourished in NAD. Cardiovascular: Normal rate and rhythm. S1,S2 noted.  No murmur, rubs or gallops noted. No JVD or BLE edema. No carotid bruits noted. Pulmonary/Chest: Normal effort and positive vesicular breath sounds. No respiratory distress. No wheezes, rales or ronchi noted.  Neurological: Alert and oriented. Cranial nerves II-XII grossly intact. Psychiatric: Mood and affect normal. Behavior is normal. Judgment and thought content normal.     BMET    Component Value Date/Time   NA 134* 12/06/2012 1248   K 3.2* 12/06/2012 1248   CL 98 12/06/2012 1248   CO2 25 12/06/2012 1248   GLUCOSE 119* 12/06/2012 1248   BUN 12 12/06/2012 1248   CREATININE 0.69 12/06/2012 1248   CALCIUM 8.9 12/06/2012 1248   GFRNONAA >90 12/06/2012 1248   GFRAA >90 12/06/2012 1248    Lipid Panel     Component Value Date/Time   TRIG 123 01/15/2014 0826   HDL 41 01/15/2014 0826   CHOLHDL 3.6 01/15/2014 0826   LDLCALC 83 01/15/2014 0826    CBC    Component Value Date/Time   WBC 6.9 12/06/2012 1248   RBC 5.40 12/06/2012 1248   HGB 13.6 12/06/2012 1248   HCT 40.5 12/06/2012 1248   PLT 292 12/06/2012 1248   MCV 75.0* 12/06/2012 1248   MCH 25.2* 12/06/2012 1248   MCHC 33.6 12/06/2012 1248   RDW 15.2 12/06/2012 1248   LYMPHSABS 1.4 12/06/2012 1248   MONOABS 0.4 12/06/2012 1248   EOSABS 0.2 12/06/2012 1248   BASOSABS 0.0 12/06/2012 1248    Hgb A1C No results found for this basename: HGBA1C     Assessment and Plan:  Health Maintenance:  He would not like to have colonoscopy Will do stool cards at each annual visit  RTC in 1 year for annual exam

## 2014-03-03 NOTE — Patient Instructions (Addendum)
Generalized Anxiety Disorder Generalized anxiety disorder (GAD) is a mental disorder. It interferes with life functions, including relationships, work, and school. GAD is different from normal anxiety, which everyone experiences at some point in their lives in response to specific life events and activities. Normal anxiety actually helps us prepare for and get through these life events and activities. Normal anxiety goes away after the event or activity is over.  GAD causes anxiety that is not necessarily related to specific events or activities. It also causes excess anxiety in proportion to specific events or activities. The anxiety associated with GAD is also difficult to control. GAD can vary from mild to severe. People with severe GAD can have intense waves of anxiety with physical symptoms (panic attacks).  SYMPTOMS The anxiety and worry associated with GAD are difficult to control. This anxiety and worry are related to many life events and activities and also occur more days than not for 6 months or longer. People with GAD also have three or more of the following symptoms (one or more in children):  Restlessness.   Fatigue.  Difficulty concentrating.   Irritability.  Muscle tension.  Difficulty sleeping or unsatisfying sleep. DIAGNOSIS GAD is diagnosed through an assessment by your health care provider. Your health care provider will ask you questions aboutyour mood,physical symptoms, and events in your life. Your health care provider may ask you about your medical history and use of alcohol or drugs, including prescription medicines. Your health care provider may also do a physical exam and blood tests. Certain medical conditions and the use of certain substances can cause symptoms similar to those associated with GAD. Your health care provider may refer you to a mental health specialist for further evaluation. TREATMENT The following therapies are usually used to treat GAD:    Medication. Antidepressant medication usually is prescribed for long-term daily control. Antianxiety medicines may be added in severe cases, especially when panic attacks occur.   Talk therapy (psychotherapy). Certain types of talk therapy can be helpful in treating GAD by providing support, education, and guidance. A form of talk therapy called cognitive behavioral therapy can teach you healthy ways to think about and react to daily life events and activities.  Stress managementtechniques. These include yoga, meditation, and exercise and can be very helpful when they are practiced regularly. A mental health specialist can help determine which treatment is best for you. Some people see improvement with one therapy. However, other people require a combination of therapies. Document Released: 11/04/2012 Document Revised: 11/24/2013 Document Reviewed: 11/04/2012 [employer] Patient Information 2015 [employer]. This information is not intended to replace advice given to you by your health care provider. Make sure you discuss any questions you have with your health care provider.  

## 2014-03-03 NOTE — Progress Notes (Signed)
Pre visit review using our clinic review tool, if applicable. No additional management support is needed unless otherwise documented below in the visit note. 

## 2014-03-03 NOTE — Assessment & Plan Note (Signed)
Encouraged him to work on diet and exercise 

## 2014-03-03 NOTE — Assessment & Plan Note (Signed)
Awaiting sleep study results He is on xanax but plans on trying to wean off after sleep study

## 2014-03-23 ENCOUNTER — Telehealth: Payer: Self-pay | Admitting: Internal Medicine

## 2014-03-23 MED ORDER — ALPRAZOLAM 0.5 MG PO TABS: 0.5000 mg | ORAL_TABLET | Freq: Every evening | ORAL | Status: DC | PRN

## 2014-03-23 NOTE — Telephone Encounter (Signed)
Please phone in xanax 

## 2014-03-23 NOTE — Telephone Encounter (Signed)
To be called in on 03/27/14 to not be filled until on or after 03/30/2014

## 2014-03-27 ENCOUNTER — Ambulatory Visit (INDEPENDENT_AMBULATORY_CARE_PROVIDER_SITE_OTHER): Payer: BC Managed Care – PPO | Admitting: Pulmonary Disease

## 2014-03-27 ENCOUNTER — Encounter: Payer: Self-pay | Admitting: Pulmonary Disease

## 2014-03-27 VITALS — BP 128/86 | HR 66 | Ht 69.5 in | Wt 337.0 lb

## 2014-03-27 DIAGNOSIS — R0989 Other specified symptoms and signs involving the circulatory and respiratory systems: Secondary | ICD-10-CM

## 2014-03-27 DIAGNOSIS — G4700 Insomnia, unspecified: Secondary | ICD-10-CM

## 2014-03-27 DIAGNOSIS — G4733 Obstructive sleep apnea (adult) (pediatric): Secondary | ICD-10-CM | POA: Insufficient documentation

## 2014-03-27 DIAGNOSIS — R0609 Other forms of dyspnea: Secondary | ICD-10-CM

## 2014-03-27 DIAGNOSIS — R0683 Snoring: Secondary | ICD-10-CM

## 2014-03-27 HISTORY — DX: Obstructive sleep apnea (adult) (pediatric): G47.33

## 2014-03-27 NOTE — Assessment & Plan Note (Signed)
He has snoring, sleep disruption, witnessed apnea, and daytime sleepiness.  He has hx of CAD and HTN.  I am concerned he could have sleep apnea.  We discussed how sleep apnea can affect various health problems including risks for hypertension, cardiovascular disease, and diabetes.  We also discussed how sleep disruption can increase risks for accident, such as while driving.  Weight loss as a means of improving sleep apnea was also reviewed.  Additional treatment options discussed were CPAP therapy, oral appliance, and surgical intervention.  To further assess will arrange for home sleep study pending insurance approval.

## 2014-03-27 NOTE — Progress Notes (Signed)
Chief Complaint  Patient presents with  . SLEEP CONSULT    Referred by Dr Sampson Si for Insomnia. Relief with Xanax. Epworth Score: 12    History of Present Illness: Steve Burnett is a 53 y.o. male for evaluation of sleep problems.  He has noticed trouble with his sleep for years.  He will wake up throughout the night.  He has been told that he snores, and will stop breathing.  He wakes up with a gasp and then feels anxious, and feels like he is having a panic attack.  He had a heart attack in 2013 >> he was advised by cardiology to have sleep study then, but deferred this while he was taking care of his son.  He goes to sleep at 10 pm.  He falls asleep in five minutes.  He wakes up multiple times to use the bathroom.  He gets out of bed at 7 am.  He feels tired in the morning.  He denies morning headache.  He does not use anything to help him fall sleep or stay awake.  He will talk and yell in his sleep.  He sometimes gets cramps in his legs.  He has been noticing trouble with anxiety, and has been using xanax >> this has helped.  He denies sleep walking, sleep talking, bruxism, or nightmares.  There is no history of restless legs.  He denies sleep hallucinations, sleep paralysis, or cataplexy.  The Epworth score is 12 out of 24.   Steve Burnett  has a past medical history of Anxiety; Obesity; Hypokalemia; Coronary artery disease; Hyperlipidemia; Hypertension; and Chicken pox.  Steve Burnett  has past surgical history that includes Cardiac catheterization (03/12/2012) and Coronary angioplasty (03/12/2012).  Prior to Admission medications   Medication Sig Start Date End Date Taking? Authorizing Provider  ALPRAZolam Prudy Feeler) 0.5 MG tablet Take 1 tablet (0.5 mg total) by mouth at bedtime as needed for anxiety. 03/23/14  Yes Lorre Munroe, NP  amLODipine (NORVASC) 10 MG tablet take 1 tablet by mouth once daily 11/17/13  Yes Iran Ouch, MD  aspirin EC 81 MG tablet Take 81 mg by mouth daily.    Yes Historical Provider, MD  carvedilol (COREG) 25 MG tablet Take 1 tablet (25 mg total) by mouth 2 (two) times daily. 11/14/13  Yes Iran Ouch, MD  citalopram (CELEXA) 20 MG tablet Take 1 tablet (20 mg total) by mouth daily. 02/17/14  Yes Lorre Munroe, NP  clopidogrel (PLAVIX) 75 MG tablet take 1 tablet by mouth once daily 11/17/13  Yes Iran Ouch, MD  lisinopril (PRINIVIL,ZESTRIL) 40 MG tablet take 1 tablet by mouth once daily 11/17/13  Yes Iran Ouch, MD  pantoprazole (PROTONIX) 40 MG tablet Take 1 tablet (40 mg total) by mouth daily. 11/14/13  Yes Iran Ouch, MD  rosuvastatin (CRESTOR) 10 MG tablet Take 10 mg by mouth daily. 11/14/13  Yes Iran Ouch, MD  nitroGLYCERIN (NITROSTAT) 0.4 MG SL tablet Place 0.4 mg under the tongue every 5 (five) minutes as needed for chest pain.     Historical Provider, MD    Allergies  Allergen Reactions  . Atorvastatin Other (See Comments)    Muscle cramps    His family history includes Heart attack (age of onset: 53) in his mother; Heart disease in his mother; Heart disease (age of onset: 28) in his sister; Hyperlipidemia in his mother; Hypertension in his mother; Stroke in his father. There is no history of Cancer or Diabetes.  He  reports that he has never smoked. He does not have any smokeless tobacco history on file. He reports that he drinks about 1.2 ounces of alcohol per week. He reports that he does not use illicit drugs.  Review of Systems  Constitutional: Negative for fever and unexpected weight change.  HENT: Negative for congestion, dental problem, ear pain, nosebleeds, postnasal drip, rhinorrhea, sinus pressure, sneezing, sore throat and trouble swallowing.   Eyes: Negative for redness and itching.  Respiratory: Positive for cough and shortness of breath. Negative for chest tightness and wheezing.   Cardiovascular: Negative for palpitations and leg swelling.  Gastrointestinal: Negative for nausea and vomiting.   Genitourinary: Negative for dysuria.  Musculoskeletal: Negative for joint swelling.  Skin: Negative for rash.  Neurological: Negative for headaches.  Hematological: Does not bruise/bleed easily.  Psychiatric/Behavioral: Negative for dysphoric mood. The patient is not nervous/anxious.    Physical Exam:  General - No distress ENT - No sinus tenderness, no oral exudate, no LAN, no thyromegaly, TM clear, pupils equal/reactive, MP 3, enlarged tongue, elongated uvula Cardiac - s1s2 regular, no murmur, pulses symmetric Chest - No wheeze/rales/dullness, good air entry, normal respiratory excursion Back - No focal tenderness Abd - Soft, non-tender, no organomegaly, + bowel sounds Ext - No edema Neuro - Normal strength, cranial nerves intact Skin - No rashes Psych - Normal mood, and behavior  Assessment/plan:  Coralyn Helling, M.D. Pager [phone]

## 2014-03-27 NOTE — Assessment & Plan Note (Signed)
His symptoms of insomnia seem more likely related to apnea events while asleep >> re-assess he need for medications after review of his sleep study.

## 2014-03-27 NOTE — Telephone Encounter (Signed)
Rx called in to pharmacy. 

## 2014-03-27 NOTE — Patient Instructions (Signed)
Will arrange for home sleep study Will call to arrange for follow up after sleep study reviewed  

## 2014-03-27 NOTE — Progress Notes (Deleted)
   Subjective:    Patient ID: Steve Burnett, male    DOB: 04/06/1961, 53 y.o.   MRN: [id number]  HPI    Review of Systems  Constitutional: Negative for fever and unexpected weight change.  HENT: Negative for congestion, dental problem, ear pain, nosebleeds, postnasal drip, rhinorrhea, sinus pressure, sneezing, sore throat and trouble swallowing.   Eyes: Negative for redness and itching.  Respiratory: Positive for cough and shortness of breath. Negative for chest tightness and wheezing.   Cardiovascular: Negative for palpitations and leg swelling.  Gastrointestinal: Negative for nausea and vomiting.  Genitourinary: Negative for dysuria.  Musculoskeletal: Negative for joint swelling.  Skin: Negative for rash.  Neurological: Negative for headaches.  Hematological: Does not bruise/bleed easily.  Psychiatric/Behavioral: Negative for dysphoric mood. The patient is not nervous/anxious.        Objective:   Physical Exam        Assessment & Plan:

## 2014-04-06 ENCOUNTER — Institutional Professional Consult (permissible substitution): Payer: BC Managed Care – PPO | Admitting: Pulmonary Disease

## 2014-04-28 ENCOUNTER — Telehealth: Payer: Self-pay | Admitting: Internal Medicine

## 2014-04-29 ENCOUNTER — Other Ambulatory Visit: Payer: Self-pay | Admitting: Cardiovascular Disease

## 2014-04-29 ENCOUNTER — Telehealth: Payer: Self-pay

## 2014-04-29 MED ORDER — ALPRAZOLAM 0.5 MG PO TABS
0.5000 mg | ORAL_TABLET | Freq: Every evening | ORAL | Status: DC | PRN
Start: 2014-04-29 — End: 2014-05-29

## 2014-04-29 NOTE — Telephone Encounter (Signed)
Rx called in to pharmacy. 

## 2014-04-29 NOTE — Telephone Encounter (Signed)
Patient stated he is at work and he "had a dizzy spell" His right arm became weak and tingly and he became warm and flushed  He is unable to take vital signs because he is at work  He denies headache, facial droop, any lower extremity weakness  He states it has been a little while since the feeling started and he "just does not feel right" Right arm remains weak and tingly   I instructed patient to go to the ED for further evaluation  Patient verbalized understanding

## 2014-04-29 NOTE — Telephone Encounter (Signed)
Please phone in xanax 

## 2014-04-29 NOTE — Telephone Encounter (Signed)
Agree. Thanks

## 2014-04-29 NOTE — Telephone Encounter (Signed)
Pt called states he had a dizzy spell, and right arm got weak and tingly, got a warm flushed feeling. Please call. States he did take his BP this morning.

## 2014-05-16 ENCOUNTER — Other Ambulatory Visit: Payer: Self-pay | Admitting: Cardiovascular Disease

## 2014-05-19 DIAGNOSIS — G4730 Sleep apnea, unspecified: Secondary | ICD-10-CM

## 2014-05-21 ENCOUNTER — Encounter: Payer: Self-pay | Admitting: Pulmonary Disease

## 2014-05-21 ENCOUNTER — Telehealth: Payer: Self-pay | Admitting: Pulmonary Disease

## 2014-05-21 NOTE — Telephone Encounter (Signed)
HST 05/19/14 >> AHI 70.7 and SaO2 low of 70%.  Will have my nurse inform pt that sleep study shows severe sleep apnea.  Options are this time are 1) CPAP set up now, 2) schedule ROV first.  If he is agreeable to starting CPAP now, then please arrange for auto CPAP with range 5 to 15 cm H2O with heated humidity and mask of choice.  Have download sent 1 month after starting CPAP, and schedule ROV 2 months after starting CPAP.

## 2014-05-22 DIAGNOSIS — G4730 Sleep apnea, unspecified: Secondary | ICD-10-CM

## 2014-05-22 NOTE — Telephone Encounter (Signed)
LM x 1 with Son

## 2014-05-28 NOTE — Telephone Encounter (Signed)
LMTC x 1  

## 2014-05-28 NOTE — Telephone Encounter (Signed)
Pt returned call.Infomred pt of the results and recs per VS. Pt stated he would like to come in for appt before getting set up with CPAP. Appt made with TP on 11/10. Pt verbalized understanding and denied any further questions or concerns at this time.

## 2014-05-29 ENCOUNTER — Other Ambulatory Visit: Payer: Self-pay | Admitting: Internal Medicine

## 2014-05-29 MED ORDER — ALPRAZOLAM 0.5 MG PO TABS: 0.5000 mg | ORAL_TABLET | Freq: Every evening | ORAL | Status: DC | PRN

## 2014-05-29 NOTE — Telephone Encounter (Signed)
Rx called in to pharmacy. 

## 2014-06-02 ENCOUNTER — Ambulatory Visit: Payer: BC Managed Care – PPO | Admitting: Adult Health

## 2014-06-21 ENCOUNTER — Other Ambulatory Visit: Payer: Self-pay | Admitting: Internal Medicine

## 2014-06-26 ENCOUNTER — Telehealth: Payer: Self-pay

## 2014-06-26 NOTE — Telephone Encounter (Signed)
PLEASE NOTE: All timestamps contained within this report are represented as [place] Standard Time. CONFIDENTIALTY NOTICE: This fax transmission is intended only for the addressee. It contains information that is legally privileged, confidential or otherwise protected from use or disclosure. If you are not the intended recipient, you are strictly prohibited from reviewing, disclosing, copying using or disseminating any of this information or taking any action in reliance on or regarding this information. If you have received this fax in error, please notify us immediately by telephone so that we can arrange for its return to us. Phone: [phone], Toll-Free: [phone], Fax: [phone] Page: 1 of 2 Call Id: [id number] [facility] - Client TELEPHONE ADVICE RECORD [facility] Patient Name: Steve Burnett Gender: Male DOB: 1961-01-30 Age: [age] Y 1 M 3 D Return Phone Number: [phone] (Primary) Address: [address] [place]Zip: [place] [address] Client [facility] - Client Client Site [facility] - Day Physician Nicki ReaperBaity, Regina Contact Type Call Call Type Triage / Clinical Relationship To Patient Self Return Phone Number [phone] (Primary) Chief Complaint Constipation Initial Comment Caller states that he had food poisoning on Thanksgiving Day. he hasn't had a bowel movement since. PreDisposition Home Care Nurse Assessment Nurse: Ladona RidgelGaddy, RN, Sunny SchleinFelicia Date/Time Lamount Cohen(Eastern Time): 06/25/2014 4:52:31 PM Confirm and document reason for call. If symptomatic, describe symptoms. ---Pt had a lot of diarrhea from food poisoning last Thurs night and had a fever and it stopped Saturday am - he ate lightly - last BM was Sat. Sunday he didnt eat. Monday he was normal and since except now he has pain in lower L back. Now he is constipated. No fever Has the patient traveled out of the country  within the last 30 days? ---No Does the patient require triage? ---Yes Related visit to physician within the last 2 weeks? ---No Does the PT have any chronic conditions? (i.e. diabetes, asthma, etc.) ---Yes List chronic conditions. ---HTN, stent placed for CAD related to heart Guidelines Guideline Title Affirmed Question Affirmed Notes Nurse Date/Time (Eastern Time) Flank Pain Pain radiates into groin, scrotum Gaddy, RN, Sunny SchleinFelicia 06/25/2014 4:56:14 PM Abdominal Pain - Male [1] MILD-MODERATE pain AND [2] constant AND [3] present > 2 hours Gaddy, RN, Felicia 06/25/2014 5:02:27 PM Disp. Time Lamount Cohen(Eastern Time) Disposition Final User 06/25/2014 5:02:07 PM See Physician within [address]24 Hours [place], RN, [facility] 06/25/2014 5:19:48 PM Call Completed Ladona RidgelGaddy, RN, Sunny SchleinFelicia 06/25/2014 5:37:39 PM Call Completed Ladona RidgelGaddy, RN, Sunny SchleinFelicia PLEASE NOTE: All timestamps contained within this report are represented as [place] Standard Time. CONFIDENTIALTY NOTICE: This fax transmission is intended only for the addressee. It contains information that is legally privileged, confidential or otherwise protected from use or disclosure. If you are not the intended recipient, you are strictly prohibited from reviewing, disclosing, copying using or disseminating any of this information or taking any action in reliance on or regarding this information. If you have received this fax in error, please notify us immediately by telephone so that we can arrange for its return to us. Phone: [phone], Toll-Free: [phone], Fax: [phone] Page: 2 of 2 Call Id: [id number] 06/25/2014 5:10:10 PM See Physician within 4 Hours (or PCP triage) Yes Ladona RidgelGaddy, RN, Lavena StanfordFelicia Caller Understands: Yes Disagree/Comply: Marine scientistComply Caller Understands: Yes Disagree/Comply: Comply Care Advice Given Per Guideline SEE PHYSICIAN WITHIN 24 HOURS: * Do not take nonsteroidal anti-inflammatory drugs (NSAIDs) if you have stomach problems, kidney disease, heart  failure, or other contraindications to using this type of medication. *  CARDIOVASCULAR RISK: There may be an increased risk of heart attack and stroke. * Fever over 100.5 F (38.1 C) * You become worse. SEE PHYSICIAN WITHIN 4 HOURS (or PCP triage): NOTHING BY MOUTH: Do not eat or drink anything for now. REST: Lie down and rest until seen. * You become worse. Comments User: Hampton AbbotFelicia, Gaddy, RN Date/Time Lamount Cohen(Eastern Time): 06/25/2014 4:57:49 PM Pain he has is in lower L abdomen - but stronger in the back User: Hampton AbbotFelicia, Gaddy, RN Date/Time Lamount Cohen(Eastern Time): 06/25/2014 5:19:20 PM Profile says office is open until 7 pm but the voice mail said they are closed. Was unable to connect caller to make appt bc of this. Pt then told nurse that he does not want to be seen tonight anyway, though he voiced understanding of advice - offered to confirm hours on an UC that we have as a referral and he said he is not going today anyway. User: Hampton AbbotFelicia, Gaddy, RN Date/Time Lamount Cohen(Eastern Time): 06/25/2014 5:37:30 PM attempted to reach office through main no. and back line and there was no option to choose No. 8. Referrals REFERRED TO PCP OFFICE

## 2014-06-29 ENCOUNTER — Other Ambulatory Visit: Payer: Self-pay | Admitting: Internal Medicine

## 2014-06-30 ENCOUNTER — Other Ambulatory Visit: Payer: Self-pay | Admitting: Internal Medicine

## 2014-06-30 MED ORDER — ALPRAZOLAM 0.5 MG PO TABS: 0.5000 mg | ORAL_TABLET | Freq: Every evening | ORAL | Status: DC | PRN

## 2014-06-30 NOTE — Telephone Encounter (Signed)
Please phone in xanax 

## 2014-07-01 NOTE — Telephone Encounter (Signed)
I do not see where this has ever been filled by you--please advise

## 2014-07-01 NOTE — Telephone Encounter (Signed)
Yes please

## 2014-07-01 NOTE — Telephone Encounter (Signed)
Yes, yesterday it was called in

## 2014-07-01 NOTE — Telephone Encounter (Signed)
He is on xanax and not ativan. Didn't we just call in the xanax as well?

## 2014-07-01 NOTE — Telephone Encounter (Signed)
Rx called in to pharmacy. 

## 2014-07-01 NOTE — Telephone Encounter (Signed)
Would you want him to do a UDS next month?--please advise

## 2014-07-02 ENCOUNTER — Encounter: Payer: Self-pay | Admitting: Adult Health

## 2014-07-02 ENCOUNTER — Ambulatory Visit (INDEPENDENT_AMBULATORY_CARE_PROVIDER_SITE_OTHER): Payer: BC Managed Care – PPO | Admitting: Adult Health

## 2014-07-02 VITALS — BP 130/88 | HR 80 | Temp 98.6°F | Ht 70.0 in | Wt 319.6 lb

## 2014-07-02 DIAGNOSIS — G4733 Obstructive sleep apnea (adult) (pediatric): Secondary | ICD-10-CM

## 2014-07-02 NOTE — Patient Instructions (Signed)
Begin CPAP At bedtime   Goal is at least 4 hrs each night .  Work on weight loss  Do not drive if sleepy.  We will send order to [facility] company .  Follow up Dr. Craige CottaSood  In 2 months and As needed

## 2014-07-09 NOTE — Assessment & Plan Note (Signed)
Sleep study >shows severe OSA w/ AHI 70 and sats as low 70%.  He is agreeable to CPAP  Will set up for auto CPAP at night  Plan for download in 1 month   Plan  Begin CPAP At bedtime   Goal is at least 4 hrs each night .  Work on weight loss  Do not drive if sleepy.  We will send order to Home Care company .  Follow up Dr. Craige CottaSood  In 2 months and As needed

## 2014-07-09 NOTE — Progress Notes (Signed)
   Subjective:    Patient ID: Steve Burnett, male    DOB: Aug 25, 1960, 53 y.o.   MRN: [id number]  HPI 53 yo male   07/02/14  Follow up sleep study Pt returns to discuss sleep study done on 05/19/14 that showed OSA with AHI 70 . And SaO2 low of 70%.  We discussed his results and complications of untreated sleep apnea.  He is aggreeable to begin nocturnal CPAP.  We discussed weight loss.     Review of Systems Constitutional:   No  weight loss, night sweats,  Fevers, chills,  +fatigue, or  lassitude.  HEENT:   No headaches,  Difficulty swallowing,  Tooth/dental problems, or  Sore throat,                No sneezing, itching, ear ache, nasal congestion, post nasal drip,   CV:  No chest pain,  Orthopnea, PND, swelling in lower extremities, anasarca, dizziness, palpitations, syncope.   GI  No heartburn, indigestion, abdominal pain, nausea, vomiting, diarrhea, change in bowel habits, loss of appetite, bloody stools.   Resp: No shortness of breath with exertion or at rest.  No excess mucus, no productive cough,  No non-productive cough,  No coughing up of blood.  No change in color of mucus.  No wheezing.  No chest wall deformity  Skin: no rash or lesions.  GU: no dysuria, change in color of urine, no urgency or frequency.  No flank pain, no hematuria   MS:  No joint pain or swelling.  No decreased range of motion.  No back pain.  Psych:  No change in mood or affect. No depression or anxiety.  No memory loss.         Objective:   Physical Exam GEN: A/Ox3; pleasant , NAD, obese   HEENT:  [place]/AT,  EACs-clear, TMs-wnl, NOSE-clear, THROAT-clear, no lesions, no postnasal drip or exudate noted.   NECK:  Supple w/ fair ROM; no JVD; normal carotid impulses w/o bruits; no thyromegaly or nodules palpated; no lymphadenopathy.  RESP  Clear  P & A; w/o, wheezes/ rales/ or rhonchi.no accessory muscle use, no dullness to percussion  CARD:  RRR, no m/r/g  , no peripheral edema, pulses  intact, no cyanosis or clubbing.  GI:   Soft & nt; nml bowel sounds; no organomegaly or masses detected.  Musco: Warm bil, no deformities or joint swelling noted.   Neuro: alert, no focal deficits noted.    Skin: Warm, no lesions or rashes         Assessment & Plan:

## 2014-07-10 NOTE — Addendum Note (Signed)
Addended by: Boone MasterJONES, Shloimy Michalski E on: 07/10/2014 11:31 AM   Modules accepted: Orders

## 2014-07-31 ENCOUNTER — Other Ambulatory Visit: Payer: Self-pay | Admitting: Internal Medicine

## 2014-08-04 NOTE — Telephone Encounter (Signed)
Rx was called in on 07/31/14--confirmed with pharmacy

## 2014-08-14 ENCOUNTER — Other Ambulatory Visit: Payer: Self-pay | Admitting: Cardiovascular Disease

## 2014-08-17 ENCOUNTER — Ambulatory Visit: Payer: Self-pay | Admitting: Cardiovascular Disease

## 2014-08-28 ENCOUNTER — Other Ambulatory Visit: Payer: Self-pay | Admitting: Internal Medicine

## 2014-08-31 ENCOUNTER — Telehealth: Payer: Self-pay

## 2014-08-31 MED ORDER — ALPRAZOLAM 0.5 MG PO TABS: 0.5000 mg | ORAL_TABLET | Freq: Every evening | ORAL | Status: DC | PRN

## 2014-08-31 NOTE — Telephone Encounter (Signed)
Rx called into pharmacy Pt will need to have UDS done before next refill

## 2014-09-04 ENCOUNTER — Encounter: Payer: Self-pay | Admitting: Cardiovascular Disease

## 2014-09-04 ENCOUNTER — Ambulatory Visit (INDEPENDENT_AMBULATORY_CARE_PROVIDER_SITE_OTHER): Payer: BLUE CROSS/BLUE SHIELD | Admitting: Cardiovascular Disease

## 2014-09-04 VITALS — BP 140/100 | HR 75 | Ht 70.0 in | Wt 309.5 lb

## 2014-09-04 DIAGNOSIS — E785 Hyperlipidemia, unspecified: Secondary | ICD-10-CM

## 2014-09-04 DIAGNOSIS — I25119 Atherosclerotic heart disease of native coronary artery with unspecified angina pectoris: Secondary | ICD-10-CM

## 2014-09-04 DIAGNOSIS — I10 Essential (primary) hypertension: Secondary | ICD-10-CM

## 2014-09-04 MED ORDER — CARVEDILOL 6.25 MG PO TABS: 6.2500 mg | ORAL_TABLET | Freq: Two times a day (BID) | ORAL | Status: DC

## 2014-09-04 NOTE — Patient Instructions (Signed)
Start Carvedilol (Coreg) 6.25 mg twice daily.  Continue other medications.   Your physician wants you to follow-up in: 6 months.  You will receive a reminder letter in the mail two months in advance. If you don't receive a letter, please call our office to schedule the follow-up appointment.

## 2014-09-07 NOTE — Assessment & Plan Note (Signed)
Lab Results  Component Value Date   HDL 41 01/15/2014   LDLCALC 83 01/15/2014   TRIG 123 01/15/2014   CHOLHDL 3.6 01/15/2014   Continue treatment with rosuvastatin. LDL was close to target at 83.

## 2014-09-07 NOTE — Progress Notes (Signed)
HPI  This is a 54 year old man who is here today for a followup visit. He presented in August, 2013  to [facility] with a small non-ST elevation myocardial infarction. He underwent cardiac catheterization which showed severe single-vessel coronary artery disease in the left anterior descending artery. There was a 60% proximal stenosis followed by diffuse 80% disease in the midsegment and a 70% discrete stenosis distally. He underwent an angioplasty and long drug-eluting stent placement to the proximal and mid LAD without complications. The distal LAD lesion was left to be treated medically.   He did have myalgia on atorvastatin which was subsequently discontinued. He has been tolerating rosuvastatin. He has been doing reasonably well and denies any chest pain or shortness of breath. He stopped taking carvedilol and his own due to fatigue.   Allergies  Allergen Reactions  . Atorvastatin Other (See Comments)    Muscle cramps     Current Outpatient Prescriptions on File Prior to Visit  Medication Sig Dispense Refill  . ALPRAZolam (XANAX) 0.5 MG tablet Take 1 tablet (0.5 mg total) by mouth at bedtime as needed for anxiety. 30 tablet 0  . amLODipine (NORVASC) 10 MG tablet take 1 tablet by mouth once daily 30 tablet 1  . aspirin EC 81 MG tablet Take 81 mg by mouth daily.    . citalopram (CELEXA) 20 MG tablet take 1 tablet by mouth once daily 30 tablet 2  . clopidogrel (PLAVIX) 75 MG tablet take 1 tablet by mouth once daily 30 tablet 3  . lisinopril (PRINIVIL,ZESTRIL) 40 MG tablet take 1 tablet by mouth once daily 90 tablet 3  . nitroGLYCERIN (NITROSTAT) 0.4 MG SL tablet Place 0.4 mg under the tongue every 5 (five) minutes as needed for chest pain.     . pantoprazole (PROTONIX) 40 MG tablet Take 1 tablet (40 mg total) by mouth daily. 90 tablet 3   No current facility-administered medications on file prior to visit.     Past Medical History  Diagnosis Date  . Anxiety   . Obesity   . Hypokalemia    . Coronary artery disease     a. 02/2012 NSTEMI/Cath/PCI: LAD: 60% proximal, 80% diffuse mid & 70d, nonobs LCX/RCA --> DES to the prox/mid LAD.  Marland Kitchen Hyperlipidemia   . Hypertension   . Chicken pox   . OSA (obstructive sleep apnea) 03/27/2014     Past Surgical History  Procedure Laterality Date  . Cardiac catheterization  03/12/2012  . Coronary angioplasty  03/12/2012    s/p drug eluting stent to the mid LAD : 3.5 x 38 mm Xience drug-eluting stent. Post dilated to 4.0 in mid segment and 4.5 proximally.     Family History  Problem Relation Age of Onset  . Heart disease Mother   . Hyperlipidemia Mother   . Hypertension Mother   . Heart attack Mother 55    CABG X 4; past at age 30  . Heart disease Sister 7    s/p stents   . Stroke Father   . Cancer Neg Hx   . Diabetes Neg Hx      History   Social History  . Marital Status: Widowed    Spouse Name: N/A  . Number of Children: N/A  . Years of Education: N/A   Occupational History  . Lexicographer    Social History Main Topics  . Smoking status: Never Smoker   . Smokeless tobacco: Not on file  . Alcohol Use: 1.2 oz/week  2 Cans of beer per week     Comment: biweekly 1-2 beers  . Drug Use: No  . Sexual Activity: Not Currently   Other Topics Concern  . Not on file   Social History Narrative   Lives locally with his son, who has a h/o liver failure s/p prior liver transplant.  He is pending repeat transplant.      PHYSICAL EXAM   BP 140/100 mmHg  Pulse 75  Ht 5\' 10"  (1.778 m)  Wt 309 lb 8 oz (140.388 kg)  BMI 44.41 kg/m2 Constitutional: He is oriented to person, place, and time. He appears well-developed and well-nourished. No distress.  HENT: No nasal discharge.  Head: Normocephalic and atraumatic.  Eyes: Pupils are equal and round. Right eye exhibits no discharge. Left eye exhibits no discharge.  Neck: Normal range of motion. Neck supple. No JVD present. No thyromegaly present.  Cardiovascular:  Normal rate, regular rhythm, normal heart sounds and. Exam reveals no gallop and no friction rub. No murmur heard.  Pulmonary/Chest: Effort normal and breath sounds normal. No stridor. No respiratory distress. He has no wheezes. He has no rales. He exhibits no tenderness.  Abdominal: Soft. Bowel sounds are normal. He exhibits no distension. There is no tenderness. There is no rebound and no guarding.  Musculoskeletal: Normal range of motion. He exhibits trace edema and no tenderness.  Neurological: He is alert and oriented to person, place, and time. Coordination normal.  Skin: Skin is warm and dry. No rash noted. He is not diaphoretic. No erythema. No pallor.  Psychiatric: He has a normal mood and affect. His behavior is normal. Judgment and thought content normal.    ZOX:WRUEAEKG:Sinus  Rhythm  Low voltage in precordial leads.   -Old anteroseptal infarct.   ABNORMAL    ASSESSMENT AND PLAN

## 2014-09-07 NOTE — Assessment & Plan Note (Signed)
He is doing overall well with no symptoms of angina. Continue medical therapy. I might consider discontinuing Plavix in the future.

## 2014-09-07 NOTE — Assessment & Plan Note (Signed)
Blood pressure is elevated today but better than before. He discontinued carvedilol and his own. He was on 25 mg twice daily and I elected to resume this medication at 6.25 mg twice daily.

## 2014-09-09 NOTE — Telephone Encounter (Signed)
Spoke to pt and he is aware that he needs to have controlled substance contract signed before next Xanax refill--pt expressed understanding

## 2014-09-22 ENCOUNTER — Other Ambulatory Visit: Payer: Self-pay | Admitting: Cardiovascular Disease

## 2014-09-30 ENCOUNTER — Other Ambulatory Visit: Payer: Self-pay | Admitting: Internal Medicine

## 2014-10-01 ENCOUNTER — Other Ambulatory Visit: Payer: Self-pay | Admitting: Internal Medicine

## 2014-10-01 ENCOUNTER — Encounter: Payer: Self-pay | Admitting: Internal Medicine

## 2014-10-02 MED ORDER — ALPRAZOLAM 0.5 MG PO TABS: 0.5000 mg | ORAL_TABLET | Freq: Every evening | ORAL | Status: DC | PRN

## 2014-10-02 NOTE — Telephone Encounter (Signed)
Rx called in to pharmacy. 

## 2014-10-29 ENCOUNTER — Other Ambulatory Visit: Payer: Self-pay | Admitting: Internal Medicine

## 2014-10-30 ENCOUNTER — Other Ambulatory Visit: Payer: Self-pay | Admitting: *Deleted

## 2014-10-30 MED ORDER — AMLODIPINE BESYLATE 10 MG PO TABS: 10.0000 mg | ORAL_TABLET | Freq: Every day | ORAL | Status: DC

## 2014-11-02 ENCOUNTER — Other Ambulatory Visit: Payer: Self-pay

## 2014-11-02 MED ORDER — ALPRAZOLAM 0.5 MG PO TABS: 0.5000 mg | ORAL_TABLET | Freq: Every evening | ORAL | Status: DC | PRN

## 2014-11-02 NOTE — Telephone Encounter (Signed)
Rx called in to pharmacy. 

## 2014-11-10 NOTE — Discharge Summary (Signed)
PATIENT NAME:  Steve Burnett, Steve Burnett MR#:  [id number] DATE OF BIRTH:  [birth year]  DATE OF ADMISSION:  04/30/2012 DATE OF DISCHARGE:  05/01/2012  ADMITTING DIAGNOSIS: Chest pain.   DISCHARGE DIAGNOSES:  1. Chest pain of unclear etiology, unlikely cardiac at this time, nonexertional, suspected GI versus musculoskeletal.  2. Known history of coronary artery disease, seems to be stable. 3. Hypertension. 4. Hyperlipidemia.  5. Obesity.   DISCHARGE CONDITION: Stable.   DISCHARGE MEDICATIONS: The patient is to continue: 1. Lisinopril 20 mg p.o. 2 tablets once daily.  2. Nitroglycerin 0.4 mg sublingually every five minutes as needed.  3. Lipitor 40 mg p.o. at bedtime. The patient was advised to hold Lipitor for approximately one week to see if his chest pain would disappear with holding Lipitor.  4. Alprazolam 0.25 mg every eight hours as needed.  5. Amlodipine 10 mg p.o. daily.  6. Metoprolol tartrate 50 mg p.o. twice daily.  7. Aspirin 81 mg p.o. daily. This is a new dose down from 325 mg p.o. daily dose.  8. Imdur 30 mg p.o. daily. This is a new medication for the patient.  9. Brilinta 90 mg p.o. daily. The patient was advised to take 2 pills of Brilinta, which would be 180 mg on day one and then continue at 90 mg p.o. daily dose one pill. 10. Omeprazole 40 mg p.o. daily. This is a new medication for the patient.   DO NOT TAKE: The patient was advised to stop clopidogrel whenever he starts Brilinta.   HOME OXYGEN: None.   DIET: 2 gram salt, low fat, low cholesterol.   PHYSICAL ACTIVITY LIMITATIONS: As tolerated.   FOLLOW-UP: Follow-up appointment with Dr. Mariah MillingGollan in two days after discharge.   CONSULTANT: Dr. Mariah MillingGollan   RADIOLOGIC STUDIES: Chest x-ray, portable, single view, 04/30/2012, showed borderline cardiomegaly.   REASON FOR ADMISSION: The patient is a 54 year old Caucasian male with past medical history significant for history of coronary artery disease, history of hypertension,  hyperlipidemia, as well as obesity who presented to the hospital with complaints of chest pain. According to the patient, he has been having chest pain for a few months now since August 2013 admission for non-Q-wave MI when he had cardiac catheterization done and stent placed. The pain was described as nonexertional pain. According to the patient on exertion he would be more comfortable in fact.   PHYSICAL EXAMINATION: His arrival vitals revealed normal temperature of 98.2, blood pressure 128/81, saturation was 96% on room air. His pulse was 77, respiration was 18. Physical exam was unremarkable.   LAB DATA: BMP showed elevated glucose to 103, otherwise unremarkable study. The patient's lipase level was normal at 142. Magnesium level was normal at 1.9. Liver enzymes were normal. Cardiac enzymes, first set as well as subsequent two more sets, were within normal limits. CBC within normal limits, however, the patient's MCV was low at 78.   The patient's chest x-ray was also unremarkable.   HOSPITAL COURSE: The patient was admitted to telemetry. His cardiac enzymes were cycled and Cardiology consultation with Dr. Mariah MillingGollan was obtained. Dr. Mariah MillingGollan saw the patient in consultation on 05/01/2012. He felt that the patient's chest pain was atypical with normal EKG as well as normal cardiac enzymes, atypical in nature, possibly gastroesophageal reflux disease, however, cannot exclude musculoskeletal disease or statin myalgia. He recommended to drop aspirin dose to 81 mg daily dose and trial of Brilinta. The patient was recommended, as mentioned above, to take 2 pills of Brilinta on day one  and then one once a day. He also was recommended to hold Lipitor for approximately one week to see if he his symptoms resolve and change statin if the patient's symptoms resolve. He also is to start PPI over-the-counter once a day. If the patient has continuing chest pains, he may need to have additional workup or medical management.  He also recommended to start the patient on isosorbide mononitrate at 30 mg p.o. daily dose and follow-up in clinic with Dr. Kirke Corin or Dr. Mariah Milling in the next 1 to 2 weeks after discharge.   In regards to coronary artery disease as well as PCI to LAD, the patient had a drug-eluting stent last month. He is to continue aspirin, low dose, as well as Brilinta instead of Plavix as mentioned above.   For hyperlipidemia, the patient is to continue Lipitor unless the patient's symptoms are actually caused by Lipitor and possible myalgias are related to Lipitor. For this reason, Lipitor will be suspended for approximately one week now to see if symptoms will abate. If they do, the patient may need to be changed to a different cholesterol medication.  For hypertension, the patient was advised to continue his outpatient medications and add isosorbide.   The patient is being discharged in stable condition with the above-mentioned medications and follow-up.   VITAL SIGNS ON THE DAY OF DISCHARGE: Temperature 97.9, pulse 71, respiration rate 16, blood pressure 148, ranging from 130's to 150's, saturation was 95% to 97% on room air at rest.   The patient was ambulated in the hospital and because his symptoms did not worsen on ambulation he is being discharged home.   TIME SPENT: 40 minutes.   ____________________________ Katharina Caper, MD rv:drc D: 05/01/2012 14:29:57 ET T: 05/01/2012 17:04:24 ET JOB#: [id number]  cc: Katharina Caper, MD, <Dictator> Antonieta Iba, MD Yovanny Coats MD ELECTRONICALLY SIGNED 05/25/2012 7:54

## 2014-11-10 NOTE — H&P (Signed)
PATIENT NAME:  Steve Burnett, Steve Burnett MR#:  [id number] DATE OF BIRTH:  09-09-1960  DATE OF ADMISSION:  04/30/2012  PRIMARY CARE PHYSICIAN: Nonlocal  REFERRING PHYSICIAN: Dr. Carollee MassedKaminski  CHIEF COMPLAINT: Chest pain today.   HISTORY OF PRESENT ILLNESS: 54 year old Caucasian male with a history of NSTEMI, coronary artery disease, hypertension, hyperlipidemia, obesity presented to the ED with chest pain today which is on the left side, sharp, no radiation, about 5/5. Patient was working at noontime then developed chest pain. He took nitro twice but the chest pain was not relieved so he came to ED for further evaluation. Patient denies any palpitation, orthopnea, nocturnal dyspnea. No leg edema. Patient denies any fever, chills. No cough, sputum, or shortness of breath. He said he has been taking medication on time. Patient was just admitted in August due to chest pain and was diagnosed with NSTEMI and placed with stent by Dr. Mariah MillingGollan.   SOCIAL HISTORY: No smoking, alcohol drinking or illicit drugs.   PAST SURGICAL HISTORY: No.  FAMILY HISTORY: Heart disease runs in his family. Mother had a heart attack and CABG at 2652. Grandfather had heart attack. Hypertension in his family.   ALLERGIES: No.   MEDICATIONS:  1. Nitroglycerin 0.4 mg 1 tablet sublingual q.5 minutes up to three times p.r.n. for chest pain.  2. Lopressor 50 mg p.o. q.12 hours.  3. Lisinopril 20 mg p.o. 2 tablets once a day.  4. Lipitor 40 mg p.o. at bedtime.  5. Plavix 75 mg p.o. daily.  6. Aspirin 325 mg p.o. daily.  7. Amlodipine 10 mg p.o. once a day.  8. Xanax 0.25 mg p.o. 1 tablet every eight hours p.r.n.   REVIEW OF SYSTEMS: CONSTITUTIONAL: Patient denies any fever, chills. No headache or dizziness. No weakness. No weight loss. EYES: No double vision, blurred vision. ENT: No epistaxis, postnasal drip, slurred speech, or dysphagia. CARDIOVASCULAR: Positive for chest pain but no palpitations, orthopnea, nocturnal dyspnea. No diaphoresis.  No leg edema. PULMONARY: No cough, sputum, shortness of breath, or hemoptysis. GASTROINTESTINAL: No abdominal pain, nausea, vomiting, or diarrhea. No melena or bloody stool. GENITOURINARY: No dysuria, hematuria, or incontinence. SKIN: No rash or jaundice. HEMATOLOGY: No easy bruising, bleeding. NEUROLOGY: No syncope, loss of consciousness or seizure.   PHYSICAL EXAMINATION:  VITAL SIGNS: Temperature 98.2, blood pressure 128/81, pulse 77, respirations 18, oxygen saturation 96% on room air.  GENERAL: Patient is alert, awake, oriented in no acute distress.   HEENT: Pupils round, equal, reactive to light, accommodation. Moist oral mucosa. Clear oropharynx.   NECK: Supple. No JVD or carotid bruit. No lymphadenopathy. No thyromegaly.   CARDIOVASCULAR: S1, S2 regular rate, rhythm. No murmurs, gallops.   PULMONARY: Bilateral air entry. No wheezing, rales. No use of accessory muscles to breathe.   ABDOMEN: Obese. Soft. No distention or tenderness. No organomegaly. Bowel sounds present.   SKIN: No rash or jaundice.   EXTREMITIES: No edema, clubbing, or cyanosis. No calf tenderness. Strong bilateral pedal pulses.   NEUROLOGIC: Alert and oriented x3. No focal deficit. Power 5/5. Sensation intact. Deep tendon reflexes 2+.   LABORATORY, DIAGNOSTIC AND RADIOLOGICAL DATA: CK 128, CK-MB 1.3, troponin less than 0.02, glucose 103, BUN 13, creatinine 0.83. Electrolytes are normal. CBC normal. Lipase 142, magnesium 1.9. EKG shows normal sinus rhythm at 71 beats per minute.   IMPRESSION:  1. Chest pain, need rule out ACS.  2. Coronary artery disease.  3. Hypertension.  4. Hyperlipidemia.  5. Obesity.   PLAN OF TREATMENT:  1. Patient will be  placed for observation. Will continue telemonitor. Start Lovenox 1 mg/kg sub-Q q.12 hours. Will continue aspirin, Plavix, Lipitor. Continue patient's home hypertension medication including lisinopril, Lopressor, Norvasc.  2. We will follow up troponin level. Will get  a cardiology consult from Dr. Mariah Milling. 3. GI and deep vein thrombosis prophylaxis.   Discussed the patient's situation and plan of treatment with patient.   TIME SPENT: About 50 minutes.   ____________________________ Shaune Pollack, MD qc:cms D: 04/30/2012 18:44:43 ET T: 05/01/2012 05:41:58 ET JOB#: [id number]  cc: Shaune Pollack, MD, <Dictator>  Shaune Pollack MD ELECTRONICALLY SIGNED 05/01/2012 14:17

## 2014-11-10 NOTE — Consult Note (Signed)
PATIENT NAME:  Steve Burnett, Steve Burnett MR#:  [id number] DATE OF BIRTH:  January 07, 1961  DATE OF CONSULTATION:  03/10/2012  REFERRING PHYSICIAN:  Dr. Imogene Burn CONSULTING PHYSICIAN:  Jerolyn Center A. Kirke Corin, MD  REASON FOR CONSULTATION: Myocardial infarction.   HISTORY OF PRESENT ILLNESS: This is a 54 year old pleasant male with past medical history of untreated hypertension and anxiety. He presented to the Emergency Room with chest pain which started this morning and woke him up at 5:30 a.m. It was described as substernal heaviness radiating to both arms. He thought it might have been due to anxiety and took Xanax without relief. He came to the Emergency Room and his discomfort was significantly relieved with sublingual nitroglycerin. His initial troponin was 0.03 and subsequently increased to 0.44. He was severely hypertensive on presentation with systolic blood pressure of more than 200. The patient reports previous history of hypertension. He was on medications in the past but had side effects and stopped taking antihypertensive medications. He also has known history of anxiety. He reports previous history of tachycardia and supraventricular tachycardia as well. He moved to [place] more than year ago from [place]. He is under significant stress related to the sickness of his son who needs re-do liver transplant. He reports no previous ischemic cardiac history. He had a CT scan done which showed no evidence of pulmonary embolism or aortic dissection. He reports having a stress test about a year and a half ago and it was normal.   PAST MEDICAL HISTORY:  1. Untreated hypertension.  2. Anxiety.  3. Obesity.   SOCIAL HISTORY: Negative for smoking, alcohol, or recreational drug use. The patient is widowed and lives with his two sons.   FAMILY HISTORY: Family history is remarkable for premature coronary artery disease as well as hypertension.   PAST SURGICAL HISTORY: None.   HOME MEDICATIONS: None.   ALLERGIES:  No known drug allergies.   REVIEW OF SYSTEMS: A 10-point review of systems was performed. It is negative other than what is mentioned in the history of present illness.   PHYSICAL EXAMINATION:  GENERAL: The patient appears his stated age and in no acute distress. He is anxious and tearful.   VITAL SIGNS: On presentation, his temperature was 98.2, blood pressure was 225/119 and currently is 135/70, pulse 82, and oxygen saturation is 95% on room air.   HEENT: Normocephalic, atraumatic.   NECK: No jugular venous distention or carotid bruits.   RESPIRATORY: Normal respiratory effort with no use of accessory muscles. Auscultation reveals normal breath sounds.   CARDIOVASCULAR: Normal PMI. Normal S1 and S2 with no gallops or murmurs.   ABDOMEN: Benign, nontender, and nondistended.   EXTREMITIES: No clubbing, cyanosis, or edema.  Distal pulses are normal bilaterally.   LABORATORY, DIAGNOSTIC, AND RADIOLOGICAL DATA: EKG showed normal sinus rhythm with no significant ST or T wave changes. CT angiogram of the chest showed no evidence of pulmonary embolism. Troponin went up to 0.44. The rest of his labs were unremarkable.   IMPRESSION:  1. Non-ST elevation myocardial infarction.  2. Hypertensive emergency.  3. Obesity.  4. Anxiety.  5. Possible diabetes.   RECOMMENDATIONS: The patient's presentation is consistent with non-ST elevation myocardial infarction. I do not see any acute ischemic changes on his EKG. He is currently with minimal chest discomfort rated at 1 out of 10. His blood pressure has improved significantly. I agree with current management including aspirin daily and a statin.  He received one dose of low molecular weight heparin and this will  be switched to unfractionated heparin this evening. He will be started on metoprolol and lisinopril. I will request an echocardiogram. I recommend proceeding with cardiac catheterization and possible coronary intervention to be done tomorrow.  The patient has no contraindication for long-term dual antiplatelet therapy.     ____________________________ Chelsea AusMuhammad A. Kirke CorinArida, MD maa:bjt D: 03/10/2012 13:20:01 ET T: 03/10/2012 14:15:19 ET JOB#: [id number]  cc: Senay Sistrunk A. Kirke CorinArida, MD, <Dictator> Iran OuchMUHAMMAD A Cylah Fannin MD ELECTRONICALLY SIGNED 03/29/2012 9:57

## 2014-11-10 NOTE — Discharge Summary (Signed)
PATIENT NAME:  Steve Burnett, Steve Burnett MR#:  [id number] DATE OF BIRTH:  05-01-1961  DATE OF ADMISSION:  03/10/2012 DATE OF DISCHARGE:  03/13/2012  PRIMARY CARE PHYSICIAN: None local.   CONSULTING PHYSICIANS: Dr. Kirke CorinArida and Dr. Mariah MillingGollan   PROCEDURE: Cardiac cath with stent placement 03/13/2012.   DISCHARGE DIAGNOSES:  1. Non-STEMI.  2. Hypertensive malignancy.  3. Hyperlipidemia.  4. Obesity.   PROCEDURE: Cardiac cath with stent placement.   CODE STATUS: FULL CODE.  DISCHARGE MEDICATIONS:  1. Aspirin 325 mg p.o. daily.  2. Lisinopril 40 mg p.o. daily. 3. Nitro 0.4 mg sublingual 1 tablet every five minutes up to three times p.r.n. for chest pain.  4. Lipitor 40 mg p.o. bedtime.  5. Plavix 75 mg p.o. daily.  6. Xanax 0.25 mg p.o. q.6 hours p.r.n.  7. Norvasc 10 mg p.o. daily.  8. Lopressor 50 mg p.o. q.12 hours.   DIET: Low sodium, low fat, low cholesterol diet.   ACTIVITY: As tolerated.   FOLLOW-UP CARE:  1. Follow-up with PCP within 1 to 2 weeks.  2. Follow-up with Dr. Mariah MillingGollan or Dr. Kirke CorinArida within one week.   REASON FOR ADMISSION: Chest pain.   HOSPITAL COURSE: The patient is a 54 year old Caucasian male with a history of hypertension and anxiety who came to the ED for chest pain which was substernal, constant, with radiation to the left shoulder associated with nausea and diaphoresis. The patient's CT angio showed no pulmonary emboli. For detailed history and physical examination, please refer to the admission note dictated by Dr. Imogene Burnhen.   On admission date, the patient's troponin was 0.44. Chest x-ray showed no acute disease. WBC 6.5, hemoglobin 14.3, BUN 15, creatinine 0.87.   The patient was admitted for acute non-STEMI with hypertensive malignancy. The patient's blood pressure was more than 200. He was started on nitroglycerin, aspirin, and heparin drip in the ED and was admitted to CCU. In addition, the patient has been treated with Zocor and started lisinopril and Lopressor. The  patient's troponin level increased to 9.9. Dr. Mariah MillingGollan did a cardiac cath on the second day after admission which showed a moderate proximal severe mid and distal LAD disease with ejection fraction 55%. Given several lesions, complicated anatomy, and need for multiple stents, Dr. Mariah MillingGollan deferred PCI until the next day. He placed a long DES placement to proximal mid LAD. He suggested distal LAD stenosis to be treated medically. Continue aspirin and Plavix for at least one year, needs aggressive treatment for risk factors. After stent placement the patient was transferred back to CCU. Nitro drip was weaned off and heparin drip was stopped. Dr. Mariah MillingGollan suggested increasing lisinopril and Lopressor and add Norvasc due to uncontrolled hypertension and follow him up him as outpatient in two weeks. The patient has no chest pain after stent placement. Vital signs are stable. Physical examination is unremarkable. He will be discharged to home today.   I discussed the discharge plan with the patient, nurse, case manager, and Dr. Mariah MillingGollan.   TIME SPENT: About 45 minutes.  ____________________________ Shaune PollackQing Doristine Shehan, MD qc:drc D: 03/13/2012 16:17:16 ET T: 03/14/2012 11:17:25 ET JOB#: [id number]  cc: Shaune PollackQing Damira Kem, MD, <Dictator> Shaune PollackQING Giabella Duhart MD ELECTRONICALLY SIGNED 03/19/2012 16:39

## 2014-11-10 NOTE — H&P (Signed)
PATIENT NAME:  Zadie CleverlyCERULLI, Prakash MR#:  [id number] DATE OF BIRTH:  12-20-1960  DATE OF ADMISSION:  03/10/2012  PRIMARY CARE PHYSICIAN:  None. REFERRING PHYSICIAN:  Dr. Manson PasseyBrown  CHIEF COMPLAINT: Chest pain today.   HISTORY OF PRESENT ILLNESS: This is a 54 year old gentleman with history of hypertension and anxiety who presented to the ED with chest pain today. The patient is alert, awake, oriented, in no acute distress. The patient said he has a history of hypertension and anxiety but is not taking any medication. The patient has been off hypertension medication for more than one year. He moved to this area about one year ago. He has no PCP so far. Yesterday he did not feel good but did not have any symptoms. This morning when he woke up at about 5:30 the patient developed chest pain, which is substernal, constant, with radiation to the left shoulder. In addition the patient had nausea and diaphoresis. He took  Xanax without relief and also took aspirin 100 mg at home. The patient came to the ED for further evaluation and was noted to have a high blood pressure of more than 200. Troponin level initially was 0.03, then increased to 0.44.  He was treated with aspirin and started on nitro drip in the ED. The patient's CT angio showed no pulmonary embolus. The patient said he had a stress test about 1-1/2 years ago which was normal.   PAST MEDICAL HISTORY:  1. Hypertension. 2. Anxiety.   SOCIAL HISTORY: No smoking, alcohol drinking, or illicit drugs.   FAMILY HISTORY: Heart disease runs in his family. Mother had a heart attack and CABG at 2952. Grandfather had heart attack. Hypertension in his family but no diabetes, stroke, or cancer.   PAST SURGICAL HISTORY:  None.  MEDICATIONS: None.   ALLERGIES: No known drug allergies.   REVIEW OF SYSTEMS: CONSTITUTIONAL: The patient denies any fever or chills. No headache or dizziness but has weakness. EYES: No double vision or blurred vision. ENT: No postnasal  drip, epistaxis, slurred speech, or dysphagia. RESPIRATORY: No cough, sputum, shortness of breath, or hemoptysis. CARDIOVASCULAR: Positive for chest pain and diaphoresis. No palpitations, orthopnea, or nocturnal dyspnea. No leg edema. GI: Positive for nausea but no vomiting, diarrhea, or abdominal pain. No melena or bloody stools. GU: No dysuria, hematuria, or incontinence. ENDOCRINE: Positive for polyuria and polydipsia, but denies any heat or cold intolerance. HEMATOLOGIC: No easy bruising or bleeding. NEURO: No syncope, loss of consciousness, or seizure. SKIN: No rash or jaundice. PSYCHIATRIC: Has anxiety but no depression. MUSCULOSKELETAL: No joint pain or edema.   PHYSICAL EXAMINATION:  VITAL SIGNS: Temperature 98.2, blood pressure initially was 225/119. After nitro drip it decreased to 162/78, pulse 82, oxygen saturation 95%.   GENERAL: The patient is alert, awake, oriented, in no acute distress. He is obese.   HEENT: Pupils round, equal, reactive to light and accommodation. Moist oral mucosa. Clear oropharynx.   NECK: Supple. No JVD or carotid bruit. No lymphadenopathy. No thyromegaly.   CARDIOVASCULAR: S1, S2. Regular rate and rhythm. No murmurs or gallops.   PULMONARY: Bilateral air entry. No wheezing or rales.   ABDOMEN: Obese, soft. Bowel sounds present. No tenderness or distention. No organomegaly.   EXTREMITIES: No edema, clubbing, or cyanosis. No calf tenderness. Strong bilateral pedal pulses.   SKIN: No rash or jaundice.   NEUROLOGIC: Alert and oriented times three. No focal deficit. Power 5/5. Sensation intact. Deep tendon reflexes 2+.   LABORATORY, DIAGNOSTIC, AND RADIOLOGICAL DATA:  CT angio  showed no pulmonary embolus. Troponin 0.44. Chest x-ray: No acute disease of the chest. WBC 6.5, hemoglobin 14.3, platelets 287. CK 235, CK-MB 1.8, PTT 29.7, glucose 151, BUN 15, creatinine 0.87, sodium 139, potassium 3.4, chloride 104, bicarbonate 24, magnesium 1.9, INR 0.9. EKG showed  normal sinus rhythm at 82 beats per minute.   IMPRESSION:  1. Acute non-STEMI.  2. Hypertension malignancy.  3. Hypokalemia.  4. Obesity.  5. Anxiety.  6. Elevated blood sugar with polyuria and polydipsia, need to rule out diabetes.   PLAN OF TREATMENT:  1. The patient will be admitted to the Critical Care Unit. We will continue telemonitor. Continue nitro drip. We will start a heparin drip and continue aspirin. We will add Zocor, start lisinopril and Lopressor, and follow up troponin level. We will check a lipid panel and we will get cardiology consult for possible cardiac catheterization.  2. For hypokalemia we will give potassium supplement and follow up basic metabolic panel. 3. For anxiety we will give Xanax p.r.n.  4. GI and deep vein thrombosis prophylaxis.   Discussed the patient's situation and plan of treatment with the patient.   TIME SPENT: About 65 minutes.   ____________________________ Shaune Pollack, MD qc:bjt D: 03/10/2012 12:02:59 ET T: 03/10/2012 12:49:15 ET JOB#: [id number]  cc: Shaune Pollack, MD, <Dictator> Shaune Pollack MD ELECTRONICALLY SIGNED 03/11/2012 14:51

## 2014-11-10 NOTE — Consult Note (Signed)
General Aspect 54 year old Caucasian male with a history of NSTEMI, coronary artery disease, PCI to mid LAD last month with DES stent, hypertension, hyperlipidemia, obesity presented to the ED with chest pain. Cardiology was consulted for chest pain.  He has had stuttering chest pain since his stent was placed last month. No significant sx with exertion. It got worse yesterday and he presented to the ER as he was concerned about CAD, recent stent failure.  Pain is on the left side, sharp, no radiation, about 5/10.  He took nitro twice but the chest pain was not relieved so he came to ED for further evaluation. Patient denies any palpitation, orthopnea, nocturnal dyspnea. No leg edema. Patient denies any fever, chills. No cough, sputum, or shortness of breath. He said he has been taking medication on time, including asa 325 mg and plavix.   he has been mowing and exerting himself over the past month with No sx.  cardiac enz are negative x 3.   SOCIAL HISTORY: No smoking, alcohol drinking or illicit drugs.   PAST SURGICAL HISTORY: No.  FAMILY HISTORY: Heart disease runs in his family. Mother had a heart attack and CABG at 20. Grandfather had heart attack. Hypertension in his family.   ALLERGIES: No.   Physical Exam:   GEN well developed, well nourished, no acute distress, obese    HEENT red conjunctivae    NECK supple  No masses    RESP normal resp effort  clear BS    CARD Regular rate and rhythm  No murmur    ABD denies tenderness  soft    LYMPH negative neck    EXTR negative edema    SKIN normal to palpation    NEURO motor/sensory function intact    PSYCH alert, A+O to time, place, person, good insight   Review of Systems:   Subjective/Chief Complaint chest pain on left, waxing and waning    General: No Complaints    Skin: No Complaints    ENT: No Complaints    Eyes: No Complaints    Neck: No Complaints    Respiratory: No Complaints    Cardiovascular: No  Complaints    Gastrointestinal: No Complaints    Genitourinary: No Complaints    Vascular: No Complaints    Musculoskeletal: No Complaints    Neurologic: No Complaints    Hematologic: No Complaints    Endocrine: No Complaints    Psychiatric: No Complaints    Review of Systems: All other systems were reviewed and found to be negative    Medications/Allergies Reviewed Medications/Allergies reviewed     MI - Myocardial Infarct:    Stent - Cardiac:        Admit Reason:   Chest pain: (786.50) Active, ICD9, Unspecified chest pain  Home Medications: Medication Instructions Status  omeprazole 40 mg oral delayed release capsule 1 cap(s) orally once a day x 30 days Active  Brilinta 90 mg oral tablet 1 tab(s) orally once a day x 30 days Active  Imdur 30 mg oral tablet, extended release 1 tab(s) orally once a day (in the morning) x 30 days Active  Aspir 81 81 mg oral delayed release tablet 1 tab(s) orally once a day x 30 days Active  Lipitor 40 mg oral tablet 1 tab(s) orally once a day (at bedtime) Active  metoprolol tartrate 50 mg oral tablet 1 tab(s) orally every 12 hours Active  amlodipine 10 mg oral tablet 1 tab(s) orally once a day Active  alprazolam  0.25 mg oral tablet 1 tab(s) orally every 8 hours x 14 days, As Needed, anxiety- for Anxiety, Nervousness  Active  nitroglycerin 0.4 mg sublingual tablet 1 tab(s) sublingual every 5 minutes up to total 3 times, As Needed, chest pain- for Chest Pain   Active  lisinopril 20 mg oral tablet 2 tab(s) orally once a day Active   Lab Results: Hepatic:  08-Oct-13 16:12    Bilirubin, Total 0.3   Alkaline Phosphatase 107   SGPT (ALT) 40   SGOT (AST) 30   Total Protein, Serum 7.7   Albumin, Serum 3.5  Routine Chem:  08-Oct-13 16:12    Glucose, Serum  103   BUN 13   Creatinine (comp) 0.83   Sodium, Serum 141   Potassium, Serum 3.7   Chloride, Serum 106   CO2, Serum 28   Calcium (Total), Serum 8.5   Osmolality (calc) 282   eGFR  (African American) >60   eGFR (Non-African American) >60 (eGFR values <69m/min/1.73 m2 may be an indication of chronic kidney disease (CKD). Calculated eGFR is useful in patients with stable renal function. The eGFR calculation will not be reliable in acutely ill patients when serum creatinine is changing rapidly. It is not useful in  patients on dialysis. The eGFR calculation may not be applicable to patients at the low and high extremes of body sizes, pregnant women, and vegetarians.)   Anion Gap 7   Lipase 142 (Result(s) reported on 30 Apr 2012 at 04:59PM.)   Magnesium, Serum 1.9 (1.8-2.4 THERAPEUTIC RANGE: 4-7 mg/dL TOXIC: > 10 mg/dL  -----------------------)  Cardiac:  08-Oct-13 16:12    CK, Total 128   CPK-MB, Serum 1.3 (Result(s) reported on 30 Apr 2012 at 04:59PM.)   Troponin I < 0.02 (0.00-0.05 0.05 ng/mL or less: NEGATIVE  Repeat testing in 3-6 hrs  if clinically indicated. >0.05 ng/mL: POTENTIAL  MYOCARDIAL INJURY. Repeat  testing in 3-6 hrs if  clinically indicated. NOTE: An increase or decrease  of 30% or more on serial  testing suggests a  clinically important change)  09-Oct-13 00:17    CK, Total 103   CPK-MB, Serum 1.2 (Result(s) reported on 01 May 2012 at 12:46AM.)   Troponin I < 0.02 (0.00-0.05 0.05 ng/mL or less: NEGATIVE  Repeat testing in 3-6 hrs  if clinically indicated. >0.05 ng/mL: POTENTIAL  MYOCARDIAL INJURY. Repeat  testing in 3-6 hrs if  clinically indicated. NOTE: An increase or decrease  of 30% or more on serial  testing suggests a  clinically important change)  Routine Hem:  08-Oct-13 16:12    WBC (CBC) 6.2   RBC (CBC) 5.24   Hemoglobin (CBC) 13.7   Hematocrit (CBC) 41.1   Platelet Count (CBC) 278 (Result(s) reported on 30 Apr 2012 at 04:48PM.)   MCV  78   MCH 26.2   MCHC 33.4   RDW  15.5   EKG:   Interpretation EKG shows NSR with rate 71 bpm, no significant ST or T wave changes    No Known Allergies:   Vital  Signs/Nurse's Notes: **Vital Signs.:   09-Oct-13 08:06   Temperature Temperature (F) 97.9   Celsius 36.6   Temperature Source oral   Pulse Pulse 71   Respirations Respirations 16   Systolic BP Systolic BP 1732  Diastolic BP (mmHg) Diastolic BP (mmHg) 97   Mean BP 114   Pulse Ox % Pulse Ox % 95   Pulse Ox Activity Level  At rest   Oxygen Delivery Room Air/  21 %     Impression 54 year old Caucasian male with a history of NSTEMI, coronary artery disease, PCI to mid LAD last month with DES stent, hypertension, hyperlipidemia, obesity presented to the ED with chest pain. Cardiology was consulted for chest pain.  1) Chest pain normal EKG, normal cardiac enz Atypical in nature -possible gerd, unable to exclude musculoskeletal d/o, statin myalgia --Suggest we drop the asapirin to 81 mg daily with trial of brilinta (two the first day, then one a day) --Hold lipitor for one week to see if sx resolve, change statin if improved. --Start PPI (over the counter) one a day --Call the office for continue chest pain, may need additional workup or medical management (ranexa for small vessel disease) --Consider start isosorbide 30 mg one a day f/u in clinic 1 to 2 weeks with dr. Fletcher Anon or gollan  2) CAD, PCI to LAD DES stent last month asa and brilinta  3) hyperlipidemia on lipitor 40 mg daily could be causing sx, possible myalgias  4) HTN: continue outpt meds,  could add isosorbide for chest pain sx. if tolerated.   Electronic Signatures: Ida Rogue (MD)  (Signed 09-Oct-13 09:46)  Authored: General Aspect/Present Illness, History and Physical Exam, Review of System, Past Medical History, Health Issues, Home Medications, Labs, EKG , Allergies, Vital Signs/Nurse's Notes, Impression/Plan   Last Updated: 09-Oct-13 09:46 by Ida Rogue (MD)

## 2014-11-10 NOTE — Consult Note (Signed)
Brief Consult Note: Diagnosis: NSTEMI, untreated hypertension, anxiety.   Patient was seen by consultant.   Consult note dictated.   Recommend to proceed with surgery or procedure.   Orders entered.   Discussed with Attending MD.   Comments: recommend cardiac cath tomorrow or today if he becomes unstable.  Start Heparin drip at 6 pm (got Lovenox at 11 am) Give Metoprolol and Lisinopril.  Continue NTG drip.  Echo.  Electronic Signatures: Lorine BearsArida, Muhammad (MD)  (Signed [phone]-Aug-13 13:10)  Authored: Brief Consult Note   Last Updated: 18-Aug-13 13:10 by Lorine BearsArida, Muhammad (MD)

## 2014-11-26 ENCOUNTER — Telehealth: Payer: Self-pay

## 2014-11-26 NOTE — Telephone Encounter (Signed)
S/w patient and advised, per Dr. Kirke CorinArida, to stop plavix and see ENT if bleeding does not stop. Pt. Verbalized understanding and had no other questions.

## 2014-11-26 NOTE — Telephone Encounter (Signed)
Stop Plavix and continue Aspirin. If bleeding does not stop, he should see ENT.

## 2014-11-26 NOTE — Telephone Encounter (Signed)
Pt states he has had a lot of nose bleeds, going on for a couple of months, but becoming more frequent. Please call.

## 2014-11-26 NOTE — Telephone Encounter (Signed)
S/w patient who indicated he has been having daily nose bleeds for a "few minutes". He is able to stop the bleeding easily.  Indicated he began using CPAP at night 4 months ago. Has also noticed few bruises throughout body.    He is taking plavix and asa as prescribed.  Steve Burnett

## 2014-12-01 ENCOUNTER — Other Ambulatory Visit: Payer: Self-pay | Admitting: Internal Medicine

## 2014-12-01 NOTE — Telephone Encounter (Signed)
Ok to phone in xanax 

## 2014-12-01 NOTE — Telephone Encounter (Signed)
Forward msg to [employer]...Raechel Chute/LMB

## 2014-12-03 MED ORDER — ALPRAZOLAM 0.5 MG PO TABS: 0.5000 mg | ORAL_TABLET | Freq: Every evening | ORAL | Status: DC | PRN

## 2014-12-03 NOTE — Telephone Encounter (Signed)
rx called into pharmacy

## 2014-12-03 NOTE — Addendum Note (Signed)
Addended by: Sueanne MargaritaSMITH, Zephan Beauchaine L on: 12/03/2014 10:14 AM   Modules accepted: Orders

## 2015-01-04 ENCOUNTER — Other Ambulatory Visit: Payer: Self-pay | Admitting: Internal Medicine

## 2015-01-04 MED ORDER — ALPRAZOLAM 0.5 MG PO TABS: 0.5000 mg | ORAL_TABLET | Freq: Every evening | ORAL | Status: DC | PRN

## 2015-01-04 NOTE — Telephone Encounter (Signed)
Ok to phone in xanax. I would try coming off the xanax before you lower the dosage of the citalopram.

## 2015-01-06 MED ORDER — ALPRAZOLAM 0.5 MG PO TABS: 0.5000 mg | ORAL_TABLET | Freq: Every evening | ORAL | Status: DC | PRN

## 2015-01-06 NOTE — Telephone Encounter (Signed)
Rx called into pharmacy Pt made an appt to discuss changes

## 2015-01-06 NOTE — Addendum Note (Signed)
Addended by: Roena Malady on: 01/06/2015 12:30 PM   Modules accepted: Orders

## 2015-01-12 ENCOUNTER — Encounter: Payer: Self-pay | Admitting: Internal Medicine

## 2015-01-12 ENCOUNTER — Ambulatory Visit (INDEPENDENT_AMBULATORY_CARE_PROVIDER_SITE_OTHER): Payer: BLUE CROSS/BLUE SHIELD | Admitting: Internal Medicine

## 2015-01-12 VITALS — BP 132/92 | HR 73 | Temp 98.3°F | Wt 314.8 lb

## 2015-01-12 DIAGNOSIS — F419 Anxiety disorder, unspecified: Secondary | ICD-10-CM

## 2015-01-12 MED ORDER — CITALOPRAM HYDROBROMIDE 10 MG PO TABS: 10.0000 mg | ORAL_TABLET | Freq: Every day | ORAL | Status: DC

## 2015-01-12 NOTE — Assessment & Plan Note (Signed)
Improved since his recent marriage Weaning schedule given for Celexa Advised him to try no to use the Xanax more in its place, if so, he will need to go back on the Celexa

## 2015-01-12 NOTE — Patient Instructions (Signed)
Generalized Anxiety Disorder Generalized anxiety disorder (GAD) is a mental disorder. It interferes with life functions, including relationships, work, and school. GAD is different from normal anxiety, which everyone experiences at some point in their lives in response to specific life events and activities. Normal anxiety actually helps us prepare for and get through these life events and activities. Normal anxiety goes away after the event or activity is over.  GAD causes anxiety that is not necessarily related to specific events or activities. It also causes excess anxiety in proportion to specific events or activities. The anxiety associated with GAD is also difficult to control. GAD can vary from mild to severe. People with severe GAD can have intense waves of anxiety with physical symptoms (panic attacks).  SYMPTOMS The anxiety and worry associated with GAD are difficult to control. This anxiety and worry are related to many life events and activities and also occur more days than not for 6 months or longer. People with GAD also have three or more of the following symptoms (one or more in children):  Restlessness.   Fatigue.  Difficulty concentrating.   Irritability.  Muscle tension.  Difficulty sleeping or unsatisfying sleep. DIAGNOSIS GAD is diagnosed through an assessment by your health care provider. Your health care provider will ask you questions aboutyour mood,physical symptoms, and events in your life. Your health care provider may ask you about your medical history and use of alcohol or drugs, including prescription medicines. Your health care provider may also do a physical exam and blood tests. Certain medical conditions and the use of certain substances can cause symptoms similar to those associated with GAD. Your health care provider may refer you to a mental health specialist for further evaluation. TREATMENT The following therapies are usually used to treat GAD:    Medication. Antidepressant medication usually is prescribed for long-term daily control. Antianxiety medicines may be added in severe cases, especially when panic attacks occur.   Talk therapy (psychotherapy). Certain types of talk therapy can be helpful in treating GAD by providing support, education, and guidance. A form of talk therapy called cognitive behavioral therapy can teach you healthy ways to think about and react to daily life events and activities.  Stress managementtechniques. These include yoga, meditation, and exercise and can be very helpful when they are practiced regularly. A mental health specialist can help determine which treatment is best for you. Some people see improvement with one therapy. However, other people require a combination of therapies. Document Released: 11/04/2012 Document Revised: 11/24/2013 Document Reviewed: 11/04/2012 Exit[employer] Patient Information 2015 [employer]. This information is not intended to replace advice given to you by your health care provider. Make sure you discuss any questions you have with your health care provider.  

## 2015-01-12 NOTE — Progress Notes (Signed)
Pre visit review using our clinic review tool, if applicable. No additional management support is needed unless otherwise documented below in the visit note. 

## 2015-01-12 NOTE — Progress Notes (Signed)
Subjective:    Patient ID: Steve Burnett, male    DOB: 1960/11/22, 54 y.o.   MRN: [id number]  HPI  Pt presents to the clinic today to follow up anxiety. He reports he is interested in coming off the Celexa. He reports he uses the Xanax twice per week because it helps him sleep. He is feeling much better, less anxious. He attributes this to his recent marriage in October 2015. He is very happy. He denies SI/HI.  Review of Systems      Past Medical History  Diagnosis Date  . Anxiety   . Obesity   . Hypokalemia   . Coronary artery disease     a. 02/2012 NSTEMI/Cath/PCI: LAD: 60% proximal, 80% diffuse mid & 70d, nonobs LCX/RCA --> DES to the prox/mid LAD.  Marland Kitchen Hyperlipidemia   . Hypertension   . Chicken pox   . OSA (obstructive sleep apnea) 03/27/2014    Current Outpatient Prescriptions  Medication Sig Dispense Refill  . ALPRAZolam (XANAX) 0.5 MG tablet Take 1 tablet (0.5 mg total) by mouth at bedtime as needed for anxiety. 30 tablet 0  . amLODipine (NORVASC) 10 MG tablet Take 1 tablet (10 mg total) by mouth daily. 30 tablet 3  . aspirin EC 81 MG tablet Take 81 mg by mouth daily.    . carvedilol (COREG) 6.25 MG tablet Take 1 tablet (6.25 mg total) by mouth 2 (two) times daily. 60 tablet 6  . citalopram (CELEXA) 20 MG tablet take 1 tablet by mouth once daily PLEASE MAKE APPOINTMENT. 30 tablet 3  . clopidogrel (PLAVIX) 75 MG tablet take 1 tablet by mouth once daily 30 tablet 3  . lisinopril (PRINIVIL,ZESTRIL) 40 MG tablet take 1 tablet by mouth once daily 90 tablet 3  . nitroGLYCERIN (NITROSTAT) 0.4 MG SL tablet Place 0.4 mg under the tongue every 5 (five) minutes as needed for chest pain.     . pantoprazole (PROTONIX) 40 MG tablet Take 1 tablet (40 mg total) by mouth daily. 90 tablet 3  . rosuvastatin (CRESTOR) 10 MG tablet Take 10 mg by mouth daily.     No current facility-administered medications for this visit.    Allergies  Allergen Reactions  . Atorvastatin Other (See  Comments)    Muscle cramps    Family History  Problem Relation Age of Onset  . Heart disease Mother   . Hyperlipidemia Mother   . Hypertension Mother   . Heart attack Mother 82    CABG X 4; past at age 47  . Heart disease Sister 42    s/p stents   . Stroke Father   . Cancer Neg Hx   . Diabetes Neg Hx     History   Social History  . Marital Status: Widowed    Spouse Name: N/A  . Number of Children: N/A  . Years of Education: N/A   Occupational History  . Lexicographer    Social History Main Topics  . Smoking status: Never Smoker   . Smokeless tobacco: Not on file  . Alcohol Use: 1.2 oz/week    2 Cans of beer per week     Comment: biweekly 1-2 beers  . Drug Use: No  . Sexual Activity: Not Currently   Other Topics Concern  . Not on file   Social History Narrative   Lives locally with his son, who has a h/o liver failure s/p prior liver transplant.  He is pending repeat transplant.     Constitutional: Denies  fever, malaise, fatigue, headache or abrupt weight changes.  Respiratory: Denies difficulty breathing, shortness of breath, cough or sputum production.   Cardiovascular: Denies chest pain, chest tightness, palpitations or swelling in the hands or feet.  Psych: Pt reports anxiety. Denies depression, SI/HI.   No other specific complaints in a complete review of systems (except as listed in HPI above).  Objective:   Physical Exam   BP 132/92 mmHg  Pulse 73  Temp(Src) 98.3 F (36.8 C) (Oral)  Wt 314 lb 12.8 oz (142.792 kg)  SpO2 96% Wt Readings from Last 3 Encounters:  01/12/15 314 lb 12.8 oz (142.792 kg)  09/04/14 309 lb 8 oz (140.388 kg)  07/02/14 319 lb 9.6 oz (144.97 kg)    General: Appears his stated age, obese in NAD. Cardiovascular: Normal rate and rhythm. S1,S2 noted.  No murmur, rubs or gallops noted.  Pulmonary/Chest: Normal effort and positive vesicular breath sounds. No respiratory distress. No wheezes, rales or ronchi noted.    Neurological: Alert and oriented.  Psychiatric: Mood and affect normal. Behavior is normal. Judgment and thought content normal.     BMET    Component Value Date/Time   NA 138 02/02/2014 0718   NA 134* 12/06/2012 1248   K 3.9 02/02/2014 0718   K 3.2* 12/06/2012 1248   CL 105 02/02/2014 0718   CL 98 12/06/2012 1248   CO2 25 02/02/2014 0718   CO2 25 12/06/2012 1248   GLUCOSE 114* 02/02/2014 0718   GLUCOSE 119* 12/06/2012 1248   BUN 16 02/02/2014 0718   BUN 12 12/06/2012 1248   CREATININE 0.83 02/02/2014 0718   CREATININE 0.69 12/06/2012 1248   CALCIUM 8.5 02/02/2014 0718   CALCIUM 8.9 12/06/2012 1248   GFRNONAA >60 02/02/2014 0718   GFRNONAA >90 12/06/2012 1248   GFRAA >60 02/02/2014 0718   GFRAA >90 12/06/2012 1248    Lipid Panel     Component Value Date/Time   CHOL 149 01/15/2014 0826   TRIG 123 01/15/2014 0826   HDL 41 01/15/2014 0826   CHOLHDL 3.6 01/15/2014 0826   LDLCALC 83 01/15/2014 0826    CBC    Component Value Date/Time   WBC 6.2 02/02/2014 0718   WBC 6.9 12/06/2012 1248   RBC 5.14 02/02/2014 0718   RBC 5.40 12/06/2012 1248   HGB 13.4 02/02/2014 0718   HGB 13.6 12/06/2012 1248   HCT 41.1 02/02/2014 0718   HCT 40.5 12/06/2012 1248   PLT 233 02/02/2014 0718   PLT 292 12/06/2012 1248   MCV 80 02/02/2014 0718   MCV 75.0* 12/06/2012 1248   MCH 26.0 02/02/2014 0718   MCH 25.2* 12/06/2012 1248   MCHC 32.5 02/02/2014 0718   MCHC 33.6 12/06/2012 1248   RDW 15.7* 02/02/2014 0718   RDW 15.2 12/06/2012 1248   LYMPHSABS 1.4 12/06/2012 1248   LYMPHSABS 0.9* 03/10/2012 1240   MONOABS 0.4 12/06/2012 1248   MONOABS 0.4 03/10/2012 1240   EOSABS 0.2 12/06/2012 1248   EOSABS 0.0 03/10/2012 1240   BASOSABS 0.0 12/06/2012 1248   BASOSABS 0.0 03/10/2012 1240    Hgb A1C No results found for: HGBA1C      Assessment & Plan:

## 2015-01-16 ENCOUNTER — Other Ambulatory Visit: Payer: Self-pay | Admitting: Cardiovascular Disease

## 2015-01-18 ENCOUNTER — Other Ambulatory Visit: Payer: Self-pay

## 2015-01-18 MED ORDER — LISINOPRIL 40 MG PO TABS: ORAL_TABLET | ORAL | Status: DC

## 2015-01-18 NOTE — Telephone Encounter (Signed)
Refill sent for lisinopril  

## 2015-02-11 ENCOUNTER — Other Ambulatory Visit: Payer: Self-pay | Admitting: Internal Medicine

## 2015-02-11 MED ORDER — ALPRAZOLAM 0.5 MG PO TABS: 0.5000 mg | ORAL_TABLET | Freq: Every evening | ORAL | Status: DC | PRN

## 2015-02-11 NOTE — Telephone Encounter (Signed)
Ok to phone in Xanax 

## 2015-02-11 NOTE — Telephone Encounter (Signed)
Last filled 01/06/15--please advise 

## 2015-02-12 NOTE — Telephone Encounter (Signed)
Rx called in to pharmacy. 

## 2015-03-01 ENCOUNTER — Other Ambulatory Visit: Payer: Self-pay | Admitting: *Deleted

## 2015-03-01 MED ORDER — CLOPIDOGREL BISULFATE 75 MG PO TABS: 75.0000 mg | ORAL_TABLET | Freq: Every day | ORAL | Status: DC

## 2015-03-02 ENCOUNTER — Ambulatory Visit (INDEPENDENT_AMBULATORY_CARE_PROVIDER_SITE_OTHER): Payer: BLUE CROSS/BLUE SHIELD | Admitting: Internal Medicine

## 2015-03-02 ENCOUNTER — Encounter: Payer: Self-pay | Admitting: Internal Medicine

## 2015-03-02 VITALS — BP 122/80 | HR 69 | Temp 98.8°F | Wt 325.0 lb

## 2015-03-02 DIAGNOSIS — M545 Low back pain, unspecified: Secondary | ICD-10-CM

## 2015-03-02 MED ORDER — PREDNISONE 10 MG PO TABS: ORAL_TABLET | ORAL | Status: DC

## 2015-03-02 NOTE — Progress Notes (Signed)
Pre visit review using our clinic review tool, if applicable. No additional management support is needed unless otherwise documented below in the visit note. 

## 2015-03-02 NOTE — Patient Instructions (Signed)
Back Pain, Adult Low back pain is very common. About 1 in 5 people have back pain.The cause of low back pain is rarely dangerous. The pain often gets better over time.About half of people with a sudden onset of back pain feel better in just 2 weeks. About 8 in 10 people feel better by 6 weeks.  CAUSES Some common causes of back pain include:  Strain of the muscles or ligaments supporting the spine.  Wear and tear (degeneration) of the spinal discs.  Arthritis.  Direct injury to the back. DIAGNOSIS Most of the time, the direct cause of low back pain is not known.However, back pain can be treated effectively even when the exact cause of the pain is unknown.Answering your caregiver's questions about your overall health and symptoms is one of the most accurate ways to make sure the cause of your pain is not dangerous. If your caregiver needs more information, he or she may order lab work or imaging tests (X-rays or MRIs).However, even if imaging tests show changes in your back, this usually does not require surgery. HOME CARE INSTRUCTIONS For many people, back pain returns.Since low back pain is rarely dangerous, it is often a condition that people can learn to manageon their own.   Remain active. It is stressful on the back to sit or stand in one place. Do not sit, drive, or stand in one place for more than 30 minutes at a time. Take short walks on level surfaces as soon as pain allows.Try to increase the length of time you walk each day.  Do not stay in bed.Resting more than 1 or 2 days can delay your recovery.  Do not avoid exercise or work.Your body is made to move.It is not dangerous to be active, even though your back may hurt.Your back will likely heal faster if you return to being active before your pain is gone.  Pay attention to your body when you bend and lift. Many people have less discomfortwhen lifting if they bend their knees, keep the load close to their bodies,and  avoid twisting. Often, the most comfortable positions are those that put less stress on your recovering back.  Find a comfortable position to sleep. Use a firm mattress and lie on your side with your knees slightly bent. If you lie on your back, put a pillow under your knees.  Only take over-the-counter or prescription medicines as directed by your caregiver. Over-the-counter medicines to reduce pain and inflammation are often the most helpful.Your caregiver may prescribe muscle relaxant drugs.These medicines help dull your pain so you can more quickly return to your normal activities and healthy exercise.  Put ice on the injured area.  Put ice in a plastic bag.  Place a towel between your skin and the bag.  Leave the ice on for 15-20 minutes, 03-04 times a day for the first 2 to 3 days. After that, ice and heat may be alternated to reduce pain and spasms.  Ask your caregiver about trying back exercises and gentle massage. This may be of some benefit.  Avoid feeling anxious or stressed.Stress increases muscle tension and can worsen back pain.It is important to recognize when you are anxious or stressed and learn ways to manage it.Exercise is a great option. SEEK MEDICAL CARE IF:  You have pain that is not relieved with rest or medicine.  You have pain that does not improve in 1 week.  You have new symptoms.  You are generally not feeling well. SEEK   IMMEDIATE MEDICAL CARE IF:   You have pain that radiates from your back into your legs.  You develop new bowel or bladder control problems.  You have unusual weakness or numbness in your arms or legs.  You develop nausea or vomiting.  You develop abdominal pain.  You feel faint. Document Released: 07/10/2005 Document Revised: 01/09/2012 Document Reviewed: 11/11/2013 Exit[employer] Patient Information 2015 [employer]. This information is not intended to replace advice given to you by your health care provider. Make sure you  discuss any questions you have with your health care provider.  

## 2015-03-02 NOTE — Progress Notes (Signed)
Subjective:    Patient ID: Steve Burnett, male    DOB: 14-May-1961, 54 y.o.   MRN: [id number]  HPI  Pt presents to the clinic today with c/o back pain. This started 2 weeks ago. The pain is in his lower back. He describes the pain as sharp and stabbing and it radiates into his right buttocks and upper thigh. He feels like his muscles are tight. He has noticed a burning sensation in his lower back but denies numbness and tingling. Moving certain ways makes it worse. He has tried heat and Ibuprofen with minimal relief. He denies any recent injury to the area but reports he was diagnosed with a bulging disc many years ago. He was previously prescribed a pred taper or given a steroid injection with good relief. He denies loss of bowl or bladder.  Review of Systems      Past Medical History  Diagnosis Date  . Anxiety   . Obesity   . Hypokalemia   . Coronary artery disease     a. 02/2012 NSTEMI/Cath/PCI: LAD: 60% proximal, 80% diffuse mid & 70d, nonobs LCX/RCA --> DES to the prox/mid LAD.  Marland Kitchen Hyperlipidemia   . Hypertension   . Chicken pox   . OSA (obstructive sleep apnea) 03/27/2014    Current Outpatient Prescriptions  Medication Sig Dispense Refill  . ALPRAZolam (XANAX) 0.5 MG tablet Take 1 tablet (0.5 mg total) by mouth at bedtime as needed for anxiety. 30 tablet 0  . amLODipine (NORVASC) 10 MG tablet Take 1 tablet (10 mg total) by mouth daily. 30 tablet 3  . aspirin EC 81 MG tablet Take 81 mg by mouth daily.    . carvedilol (COREG) 6.25 MG tablet Take 1 tablet (6.25 mg total) by mouth 2 (two) times daily. 60 tablet 6  . citalopram (CELEXA) 10 MG tablet Take 1 tablet (10 mg total) by mouth daily. 30 tablet 1  . clopidogrel (PLAVIX) 75 MG tablet Take 1 tablet (75 mg total) by mouth daily. 30 tablet 3  . lisinopril (PRINIVIL,ZESTRIL) 40 MG tablet take 1 tablet by mouth once daily 90 tablet 3  . nitroGLYCERIN (NITROSTAT) 0.4 MG SL tablet Place 0.4 mg under the tongue every 5 (five) minutes  as needed for chest pain.     . pantoprazole (PROTONIX) 40 MG tablet Take 1 tablet (40 mg total) by mouth daily. 90 tablet 3  . rosuvastatin (CRESTOR) 10 MG tablet Take 10 mg by mouth daily.     No current facility-administered medications for this visit.    Allergies  Allergen Reactions  . Atorvastatin Other (See Comments)    Muscle cramps    Family History  Problem Relation Age of Onset  . Heart disease Mother   . Hyperlipidemia Mother   . Hypertension Mother   . Heart attack Mother 55    CABG X 4; past at age 39  . Heart disease Sister 6    s/p stents   . Stroke Father   . Cancer Neg Hx   . Diabetes Neg Hx     History   Social History  . Marital Status: Widowed    Spouse Name: N/A  . Number of Children: N/A  . Years of Education: N/A   Occupational History  . Lexicographer    Social History Main Topics  . Smoking status: Never Smoker   . Smokeless tobacco: Not on file  . Alcohol Use: 1.2 oz/week    2 Cans of beer per week  Comment: biweekly 1-2 beers  . Drug Use: No  . Sexual Activity: Not Currently   Other Topics Concern  . Not on file   Social History Narrative   Lives locally with his son, who has a h/o liver failure s/p prior liver transplant.  He is pending repeat transplant.     Constitutional: Denies fever, malaise, fatigue, headache or abrupt weight changes.  Respiratory: Denies difficulty breathing, shortness of breath, cough or sputum production.   Cardiovascular: Denies chest pain, chest tightness, palpitations or swelling in the hands or feet.  Gastrointestinal: Denies abdominal pain, bloating, constipation, diarrhea or blood in the stool.  GU: Denies urgency, frequency, pain with urination, burning sensation, blood in urine, odor or discharge. Musculoskeletal: Pt reports back pain. Denies decrease in range of motion, difficulty with gait, or joint swelling.  Skin: Denies redness, rashes, lesions or ulcercations.  Neurological:  Denies dizziness, difficulty with memory, difficulty with speech or problems with balance and coordination.   No other specific complaints in a complete review of systems (except as listed in HPI above).  Objective:   Physical Exam   BP 122/80 mmHg  Pulse 69  Temp(Src) 98.8 F (37.1 C) (Oral)  Wt 325 lb (147.419 kg)  SpO2 98% Wt Readings from Last 3 Encounters:  03/02/15 325 lb (147.419 kg)  01/12/15 314 lb 12.8 oz (142.792 kg)  09/04/14 309 lb 8 oz (140.388 kg)    General: Appears his stated age, obese in NAD. Skin: Warm, dry and intact. No rashes, lesions or ulcerations noted. Cardiovascular: Normal rate and rhythm. S1,S2 noted.  No murmur, rubs or gallops noted.  Pulmonary/Chest: Normal effort and positive vesicular breath sounds. No respiratory distress. No wheezes, rales or ronchi noted.  Abdomen: Soft and nontender. Normal bowel sounds, no bruits noted.  Musculoskeletal: Normal flexion, extension of the spine. Pain with rotation. No bony tenderness noted. Some mild discomfort with palpation of the paralumbar muscles. Strength 5/5 BLE. No difficulty with gait.  Neurological: Alert and oriented. Negative SLR bilaterally.   BMET    Component Value Date/Time   NA 138 02/02/2014 0718   NA 134* 12/06/2012 1248   K 3.9 02/02/2014 0718   K 3.2* 12/06/2012 1248   CL 105 02/02/2014 0718   CL 98 12/06/2012 1248   CO2 25 02/02/2014 0718   CO2 25 12/06/2012 1248   GLUCOSE 114* 02/02/2014 0718   GLUCOSE 119* 12/06/2012 1248   BUN 16 02/02/2014 0718   BUN 12 12/06/2012 1248   CREATININE 0.83 02/02/2014 0718   CREATININE 0.69 12/06/2012 1248   CALCIUM 8.5 02/02/2014 0718   CALCIUM 8.9 12/06/2012 1248   GFRNONAA >60 02/02/2014 0718   GFRNONAA >90 12/06/2012 1248   GFRAA >60 02/02/2014 0718   GFRAA >90 12/06/2012 1248    Lipid Panel     Component Value Date/Time   CHOL 149 01/15/2014 0826   CHOL 167 03/11/2012 0645   TRIG 123 01/15/2014 0826   TRIG 98 03/11/2012 0645     HDL 41 01/15/2014 0826   HDL 41 03/11/2012 0645   CHOLHDL 3.6 01/15/2014 0826   VLDL 20 03/11/2012 0645   LDLCALC 83 01/15/2014 0826   LDLCALC 106* 03/11/2012 0645    CBC    Component Value Date/Time   WBC 6.2 02/02/2014 0718   WBC 6.9 12/06/2012 1248   RBC 5.14 02/02/2014 0718   RBC 5.40 12/06/2012 1248   HGB 13.4 02/02/2014 0718   HGB 13.6 12/06/2012 1248   HCT 41.1 02/02/2014  0718   HCT 40.5 12/06/2012 1248   PLT 233 02/02/2014 0718   PLT 292 12/06/2012 1248   MCV 80 02/02/2014 0718   MCV 75.0* 12/06/2012 1248   MCH 26.0 02/02/2014 0718   MCH 25.2* 12/06/2012 1248   MCHC 32.5 02/02/2014 0718   MCHC 33.6 12/06/2012 1248   RDW 15.7* 02/02/2014 0718   RDW 15.2 12/06/2012 1248   LYMPHSABS 1.4 12/06/2012 1248   LYMPHSABS 0.9* 03/10/2012 1240   MONOABS 0.4 12/06/2012 1248   MONOABS 0.4 03/10/2012 1240   EOSABS 0.2 12/06/2012 1248   EOSABS 0.0 03/10/2012 1240   BASOSABS 0.0 12/06/2012 1248   BASOSABS 0.0 03/10/2012 1240    Hgb A1C No results found for: HGBA1C      Assessment & Plan:   Bilateral low back pain:  ? Muscle versus nerve He has had good relief in the past with steroids eRx for Pred Taper x 6 days Stretching exercises given If pain persist, consider further workup with Xray/MRI  RTC as needed or if symptoms persist or worsen

## 2015-03-08 ENCOUNTER — Other Ambulatory Visit: Payer: Self-pay | Admitting: Internal Medicine

## 2015-03-08 ENCOUNTER — Encounter: Payer: Self-pay | Admitting: Internal Medicine

## 2015-03-08 MED ORDER — PREDNISONE 10 MG PO TABS: ORAL_TABLET | ORAL | Status: DC

## 2015-03-16 ENCOUNTER — Other Ambulatory Visit: Payer: Self-pay | Admitting: Internal Medicine

## 2015-03-16 DIAGNOSIS — M545 Low back pain: Secondary | ICD-10-CM

## 2015-03-18 ENCOUNTER — Encounter: Payer: Self-pay | Admitting: Internal Medicine

## 2015-03-18 ENCOUNTER — Ambulatory Visit (INDEPENDENT_AMBULATORY_CARE_PROVIDER_SITE_OTHER): Payer: BLUE CROSS/BLUE SHIELD | Admitting: Internal Medicine

## 2015-03-18 ENCOUNTER — Telehealth: Payer: Self-pay | Admitting: Internal Medicine

## 2015-03-18 VITALS — BP 138/88 | HR 83 | Temp 98.8°F | Wt 322.0 lb

## 2015-03-18 DIAGNOSIS — M5442 Lumbago with sciatica, left side: Secondary | ICD-10-CM | POA: Diagnosis not present

## 2015-03-18 DIAGNOSIS — M5441 Lumbago with sciatica, right side: Secondary | ICD-10-CM | POA: Diagnosis not present

## 2015-03-18 NOTE — Progress Notes (Signed)
Subjective:    Patient ID: Steve Burnett, male    DOB: [birth year], 54 y.o.   MRN: [id number]  HPI  Pt presents to the clinic today to follow up back pain. He was seen 8/9 for the same and was given a round of prednisone for a presumed sciatica. He did note some improvement but as soon as he stopped taking the steroids, his pain came back. We repeated the round of steroids, and his pain is better. Now instead of being in constant pain, it hurts mostly when he goes from a sitting to a standing position. He describes the pain as sore, achy and it feels like it is pulling. The pain still radiates down his legs. He has some numbness and tingling. He has started taking Ibuprofen OTC which doesn't help that much but he reports he deals with it. He wants to know what other workup could be done to determine the cause of his pain.  Review of Systems      Past Medical History  Diagnosis Date  . Anxiety   . Obesity   . Hypokalemia   . Coronary artery disease     a. 02/2012 NSTEMI/Cath/PCI: LAD: 60% proximal, 80% diffuse mid & 70d, nonobs LCX/RCA --> DES to the prox/mid LAD.  Marland Kitchen Hyperlipidemia   . Hypertension   . Chicken pox   . OSA (obstructive sleep apnea) 03/27/2014    Current Outpatient Prescriptions  Medication Sig Dispense Refill  . ALPRAZolam (XANAX) 0.5 MG tablet Take 1 tablet (0.5 mg total) by mouth at bedtime as needed for anxiety. 30 tablet 0  . amLODipine (NORVASC) 10 MG tablet Take 1 tablet (10 mg total) by mouth daily. 30 tablet 3  . aspirin EC 81 MG tablet Take 81 mg by mouth daily.    . carvedilol (COREG) 6.25 MG tablet Take 1 tablet (6.25 mg total) by mouth 2 (two) times daily. 60 tablet 6  . clopidogrel (PLAVIX) 75 MG tablet Take 1 tablet (75 mg total) by mouth daily. 30 tablet 3  . lisinopril (PRINIVIL,ZESTRIL) 40 MG tablet take 1 tablet by mouth once daily 90 tablet 3  . nitroGLYCERIN (NITROSTAT) 0.4 MG SL tablet Place 0.4 mg under the tongue every 5 (five) minutes as needed  for chest pain.     . rosuvastatin (CRESTOR) 10 MG tablet Take 10 mg by mouth daily.     No current facility-administered medications for this visit.    Allergies  Allergen Reactions  . Atorvastatin Other (See Comments)    Muscle cramps    Family History  Problem Relation Age of Onset  . Heart disease Mother   . Hyperlipidemia Mother   . Hypertension Mother   . Heart attack Mother 58    CABG X 4; past at age 15  . Heart disease Sister 59    s/p stents   . Stroke Father   . Cancer Neg Hx   . Diabetes Neg Hx     Social History   Social History  . Marital Status: Widowed    Spouse Name: N/A  . Number of Children: N/A  . Years of Education: N/A   Occupational History  . Lexicographer    Social History Main Topics  . Smoking status: Never Smoker   . Smokeless tobacco: Not on file  . Alcohol Use: 1.2 oz/week    2 Cans of beer per week     Comment: biweekly 1-2 beers  . Drug Use: No  . Sexual Activity:  Not Currently   Other Topics Concern  . Not on file   Social History Narrative   Lives locally with his son, who has a h/o liver failure s/p prior liver transplant.  He is pending repeat transplant.   Constitutional: Denies fever, malaise, fatigue, headache or abrupt weight changes.  Respiratory: Denies difficulty breathing, shortness of breath, cough or sputum production.   Cardiovascular: Denies chest pain, chest tightness, palpitations or swelling in the hands or feet.  Gastrointestinal: Denies abdominal pain, bloating, constipation, diarrhea or blood in the stool.  GU: Denies urgency, frequency, pain with urination, burning sensation, blood in urine, odor or discharge. Musculoskeletal: Pt reports back pain. Denies decrease in range of motion, difficulty with gait, or joint swelling.  Skin: Denies redness, rashes, lesions or ulcercations.  Neurological: Denies dizziness, difficulty with memory, difficulty with speech or problems with balance and  coordination.     No other specific complaints in a complete review of systems (except as listed in HPI above).  Objective:   Physical Exam  BP 138/88 mmHg  Pulse 83  Temp(Src) 98.8 F (37.1 C) (Oral)  Wt 322 lb (146.058 kg)  SpO2 98% Wt Readings from Last 3 Encounters:  03/18/15 322 lb (146.058 kg)  03/02/15 325 lb (147.419 kg)  01/12/15 314 lb 12.8 oz (142.792 kg)    General: Appears his stated age, obese in NAD. Skin: Warm, dry and intact. No rashes, lesions or ulcerations noted. Cardiovascular: Normal rate and rhythm. S1,S2 noted.  No murmur, rubs or gallops noted.   Pulmonary/Chest: Normal effort and positive vesicular breath sounds. No respiratory distress. No wheezes, rales or ronchi noted.  Abdomen: Soft and nontender. Normal bowel sounds, no bruits noted.  Musculoskeletal: Normal flexion, extension of the spine. Pain with rotation. No bony tenderness noted. Some mild discomfort with palpation of the paralumbar muscles. Strength 5/5 BLE. No difficulty with gait.  Neurological: Alert and oriented. Negative SLR bilaterally.    BMET    Component Value Date/Time   NA 138 02/02/2014 0718   NA 134* 12/06/2012 1248   K 3.9 02/02/2014 0718   K 3.2* 12/06/2012 1248   CL 105 02/02/2014 0718   CL 98 12/06/2012 1248   CO2 25 02/02/2014 0718   CO2 25 12/06/2012 1248   GLUCOSE 114* 02/02/2014 0718   GLUCOSE 119* 12/06/2012 1248   BUN 16 02/02/2014 0718   BUN 12 12/06/2012 1248   CREATININE 0.83 02/02/2014 0718   CREATININE 0.69 12/06/2012 1248   CALCIUM 8.5 02/02/2014 0718   CALCIUM 8.9 12/06/2012 1248   GFRNONAA >60 02/02/2014 0718   GFRNONAA >90 12/06/2012 1248   GFRAA >60 02/02/2014 0718   GFRAA >90 12/06/2012 1248    Lipid Panel     Component Value Date/Time   CHOL 149 01/15/2014 0826   CHOL 167 03/11/2012 0645   TRIG 123 01/15/2014 0826   TRIG 98 03/11/2012 0645   HDL 41 01/15/2014 0826   HDL 41 03/11/2012 0645   CHOLHDL 3.6 01/15/2014 0826   VLDL 20  03/11/2012 0645   LDLCALC 83 01/15/2014 0826   LDLCALC 106* 03/11/2012 0645    CBC    Component Value Date/Time   WBC 6.2 02/02/2014 0718   WBC 6.9 12/06/2012 1248   RBC 5.14 02/02/2014 0718   RBC 5.40 12/06/2012 1248   HGB 13.4 02/02/2014 0718   HGB 13.6 12/06/2012 1248   HCT 41.1 02/02/2014 0718   HCT 40.5 12/06/2012 1248   PLT 233 02/02/2014 1610  PLT 292 12/06/2012 1248   MCV 80 02/02/2014 0718   MCV 75.0* 12/06/2012 1248   MCH 26.0 02/02/2014 0718   MCH 25.2* 12/06/2012 1248   MCHC 32.5 02/02/2014 0718   MCHC 33.6 12/06/2012 1248   RDW 15.7* 02/02/2014 0718   RDW 15.2 12/06/2012 1248   LYMPHSABS 1.4 12/06/2012 1248   LYMPHSABS 0.9* 03/10/2012 1240   MONOABS 0.4 12/06/2012 1248   MONOABS 0.4 03/10/2012 1240   EOSABS 0.2 12/06/2012 1248   EOSABS 0.0 03/10/2012 1240   BASOSABS 0.0 12/06/2012 1248   BASOSABS 0.0 03/10/2012 1240    Hgb A1C Lab Results  Component Value Date   HGBA1C 6.1 03/11/2012         Assessment & Plan:   Low back pain with sciatica:  Will obtain xray of lumbar spine today Discussed how weight loss could improve his pain He declines RX for Tramadol at this time I had placed a referral for ortho- he wants to hold off on scheduling this until he gets the results from the xray Continue Ibuprofen prn Will also check A1C given weight, numbness and tingling just to r/o DM  Will follow uop after xray, RTC in 4 months for annual exam and to follow up chronic conditions, sooner if worse

## 2015-03-18 NOTE — Progress Notes (Signed)
Pre visit review using our clinic review tool, if applicable. No additional management support is needed unless otherwise documented below in the visit note. 

## 2015-03-18 NOTE — Telephone Encounter (Signed)
Pt is scheduled for Friday, September 9th at 3:30pm with Horris Latino at [facility] in [place]. Steve Burnett is the PA for Dr. Joice Lofts for initial workup. He needs to arrive at 3:15pm, bring insurance card, co-pay and a copy of x-ray from [facility]. I have already asked Steve Burnett to burn disc when x-ray report comes back so he will need to pick up from here and take with him to appointment.  Thanks

## 2015-03-18 NOTE — Patient Instructions (Signed)
Back Exercises These exercises may help you when beginning to rehabilitate your injury. Your symptoms may resolve with or without further involvement from your physician, physical therapist or athletic trainer. While completing these exercises, remember:   Restoring tissue flexibility helps normal motion to return to the joints. This allows healthier, less painful movement and activity.  An effective stretch should be held for at least 30 seconds.  A stretch should never be painful. You should only feel a gentle lengthening or release in the stretched tissue. STRETCH - Extension, Prone on Elbows   Lie on your stomach on the floor, a bed will be too soft. Place your palms about shoulder width apart and at the height of your head.  Place your elbows under your shoulders. If this is too painful, stack pillows under your chest.  Allow your body to relax so that your hips drop lower and make contact more completely with the floor.  Hold this position for __________ seconds.  Slowly return to lying flat on the floor. Repeat __________ times. Complete this exercise __________ times per day.  RANGE OF MOTION - Extension, Prone Press Ups   Lie on your stomach on the floor, a bed will be too soft. Place your palms about shoulder width apart and at the height of your head.  Keeping your back as relaxed as possible, slowly straighten your elbows while keeping your hips on the floor. You may adjust the placement of your hands to maximize your comfort. As you gain motion, your hands will come more underneath your shoulders.  Hold this position __________ seconds.  Slowly return to lying flat on the floor. Repeat __________ times. Complete this exercise __________ times per day.  RANGE OF MOTION- Quadruped, Neutral Spine   Assume a hands and knees position on a firm surface. Keep your hands under your shoulders and your knees under your hips. You may place padding under your knees for  comfort.  Drop your head and point your tail bone toward the ground below you. This will round out your low back like an angry cat. Hold this position for __________ seconds.  Slowly lift your head and release your tail bone so that your back sags into a large arch, like an old horse.  Hold this position for __________ seconds.  Repeat this until you feel limber in your low back.  Now, find your "sweet spot." This will be the most comfortable position somewhere between the two previous positions. This is your neutral spine. Once you have found this position, tense your stomach muscles to support your low back.  Hold this position for __________ seconds. Repeat __________ times. Complete this exercise __________ times per day.  STRETCH - Flexion, Single Knee to Chest   Lie on a firm bed or floor with both legs extended in front of you.  Keeping one leg in contact with the floor, bring your opposite knee to your chest. Hold your leg in place by either grabbing behind your thigh or at your knee.  Pull until you feel a gentle stretch in your low back. Hold __________ seconds.  Slowly release your grasp and repeat the exercise with the opposite side. Repeat __________ times. Complete this exercise __________ times per day.  STRETCH - Hamstrings, Standing  Stand or sit and extend your right / left leg, placing your foot on a chair or foot stool  Keeping a slight arch in your low back and your hips straight forward.  Lead with your chest and   lean forward at the waist until you feel a gentle stretch in the back of your right / left knee or thigh. (When done correctly, this exercise requires leaning only a small distance.)  Hold this position for __________ seconds. Repeat __________ times. Complete this stretch __________ times per day. STRENGTHENING - Deep Abdominals, Pelvic Tilt   Lie on a firm bed or floor. Keeping your legs in front of you, bend your knees so they are both pointed  toward the ceiling and your feet are flat on the floor.  Tense your lower abdominal muscles to press your low back into the floor. This motion will rotate your pelvis so that your tail bone is scooping upwards rather than pointing at your feet or into the floor.  With a gentle tension and even breathing, hold this position for __________ seconds. Repeat __________ times. Complete this exercise __________ times per day.  STRENGTHENING - Abdominals, Crunches   Lie on a firm bed or floor. Keeping your legs in front of you, bend your knees so they are both pointed toward the ceiling and your feet are flat on the floor. Cross your arms over your chest.  Slightly tip your chin down without bending your neck.  Tense your abdominals and slowly lift your trunk high enough to just clear your shoulder blades. Lifting higher can put excessive stress on the low back and does not further strengthen your abdominal muscles.  Control your return to the starting position. Repeat __________ times. Complete this exercise __________ times per day.  STRENGTHENING - Quadruped, Opposite UE/LE Lift   Assume a hands and knees position on a firm surface. Keep your hands under your shoulders and your knees under your hips. You may place padding under your knees for comfort.  Find your neutral spine and gently tense your abdominal muscles so that you can maintain this position. Your shoulders and hips should form a rectangle that is parallel with the floor and is not twisted.  Keeping your trunk steady, lift your right hand no higher than your shoulder and then your left leg no higher than your hip. Make sure you are not holding your breath. Hold this position __________ seconds.  Continuing to keep your abdominal muscles tense and your back steady, slowly return to your starting position. Repeat with the opposite arm and leg. Repeat __________ times. Complete this exercise __________ times per day. Document Released:  07/28/2005 Document Revised: 10/02/2011 Document Reviewed: 10/22/2008 Exit[employer] Patient Information 2015 [employer]. This information is not intended to replace advice given to you by your health care provider. Make sure you discuss any questions you have with your health care provider.  

## 2015-03-18 NOTE — Progress Notes (Signed)
Subjective:    Patient ID: Steve Burnett, male    DOB: 18-Nov-1960, 54 y.o.   MRN: [id number]  HPI  Steve Burnett is a 54 year old male who presents today with chief compliant of back pain that began over a month ago.  Pain is located in his sacral area and occurs when he exerts himself and when he gets up from a sitting position.  The pain is describes as hot and tingly and shoots down his legs.  The pain is aggravated by exercise.  He was recently prescribed a prednisone taper and reports that it helped somewhat.  He reports intermittent use of ibuprofen with minimal relief.    Review of Systems  Constitutional: Negative for fever, chills and fatigue.  HENT: Negative.   Respiratory: Negative for cough, shortness of breath and wheezing.   Cardiovascular: Negative for chest pain, palpitations and leg swelling.  Gastrointestinal: Negative.   Genitourinary: Negative for frequency, flank pain and difficulty urinating.  Musculoskeletal: Positive for back pain. Negative for arthralgias, gait problem, neck pain and neck stiffness.  Skin: Negative.   Neurological: Negative for dizziness, tremors, facial asymmetry, weakness, light-headedness and headaches.  Psychiatric/Behavioral: Negative for agitation. The patient is not nervous/anxious.    Family History  Problem Relation Age of Onset  . Heart disease Mother   . Hyperlipidemia Mother   . Hypertension Mother   . Heart attack Mother 33    CABG X 4; past at age 62  . Heart disease Sister 51    s/p stents   . Stroke Father   . Cancer Neg Hx   . Diabetes Neg Hx    Current Outpatient Prescriptions on File Prior to Visit  Medication Sig Dispense Refill  . ALPRAZolam (XANAX) 0.5 MG tablet Take 1 tablet (0.5 mg total) by mouth at bedtime as needed for anxiety. 30 tablet 0  . amLODipine (NORVASC) 10 MG tablet Take 1 tablet (10 mg total) by mouth daily. 30 tablet 3  . aspirin EC 81 MG tablet Take 81 mg by mouth daily.    . carvedilol (COREG)  6.25 MG tablet Take 1 tablet (6.25 mg total) by mouth 2 (two) times daily. 60 tablet 6  . clopidogrel (PLAVIX) 75 MG tablet Take 1 tablet (75 mg total) by mouth daily. 30 tablet 3  . lisinopril (PRINIVIL,ZESTRIL) 40 MG tablet take 1 tablet by mouth once daily 90 tablet 3  . nitroGLYCERIN (NITROSTAT) 0.4 MG SL tablet Place 0.4 mg under the tongue every 5 (five) minutes as needed for chest pain.     . rosuvastatin (CRESTOR) 10 MG tablet Take 10 mg by mouth daily.     No current facility-administered medications on file prior to visit.        Objective:   Physical Exam  Constitutional: He is oriented to person, place, and time. He appears well-developed and well-nourished.  HENT:  Head: Normocephalic and atraumatic.  Eyes: Pupils are equal, round, and reactive to light.  Neck: Normal range of motion. Neck supple. No JVD present.  Cardiovascular: Normal rate, regular rhythm, normal heart sounds and intact distal pulses.   No murmur heard. Pulmonary/Chest: Effort normal and breath sounds normal.  Abdominal: Soft. Bowel sounds are normal.  Musculoskeletal: Normal range of motion.       Lumbar back: He exhibits pain. He exhibits no tenderness, no swelling, no edema and no laceration.  Lymphadenopathy:    He has no cervical adenopathy.  Neurological: He is alert and oriented to person,  place, and time.  Skin: Skin is warm and dry.          Assessment & Plan:  1. Lumbar pain - Will obtain Xray today.  Has a referral for Ortho in, however he has not had his appointment yet.  He will follow up with this.

## 2015-03-22 ENCOUNTER — Encounter: Payer: Self-pay | Admitting: *Deleted

## 2015-03-22 NOTE — Addendum Note (Signed)
Addended by: Baldomero Lamy on: 03/22/2015 09:18 AM   Modules accepted: Orders

## 2015-03-25 NOTE — Telephone Encounter (Signed)
Mel-  Please call pt to see if he has gotten his xray done yet. We have set him up with an orthopedic doctor and he was not sure if he wanted to go or not until he saw the results of his xray. If he has not gotten it yet, then I need to cancel the orthopedic referral.

## 2015-03-25 NOTE — Telephone Encounter (Signed)
Spoke with pt and he states he will try to get over to [facility] today

## 2015-04-05 ENCOUNTER — Ambulatory Visit
Admission: RE | Admit: 2015-04-05 | Discharge: 2015-04-05 | Disposition: A | Discharge status: Home / Self Care | Payer: BLUE CROSS/BLUE SHIELD | Source: Ambulatory Visit | Attending: Internal Medicine | Admitting: Internal Medicine

## 2015-04-05 DIAGNOSIS — M5441 Lumbago with sciatica, right side: Secondary | ICD-10-CM

## 2015-04-05 DIAGNOSIS — M47816 Spondylosis without myelopathy or radiculopathy, lumbar region: Secondary | ICD-10-CM | POA: Diagnosis not present

## 2015-04-05 DIAGNOSIS — M5442 Lumbago with sciatica, left side: Secondary | ICD-10-CM | POA: Diagnosis present

## 2015-04-05 DIAGNOSIS — M4186 Other forms of scoliosis, lumbar region: Secondary | ICD-10-CM | POA: Diagnosis not present

## 2015-04-06 ENCOUNTER — Other Ambulatory Visit: Payer: Self-pay | Admitting: Cardiovascular Disease

## 2015-04-06 ENCOUNTER — Encounter: Payer: Self-pay | Admitting: Internal Medicine

## 2015-04-14 ENCOUNTER — Other Ambulatory Visit: Payer: Self-pay | Admitting: Student

## 2015-04-14 DIAGNOSIS — M5441 Lumbago with sciatica, right side: Secondary | ICD-10-CM

## 2015-04-21 ENCOUNTER — Other Ambulatory Visit: Payer: BLUE CROSS/BLUE SHIELD

## 2015-04-23 ENCOUNTER — Ambulatory Visit
Admission: RE | Admit: 2015-04-23 | Discharge: 2015-04-23 | Disposition: A | Discharge status: Home / Self Care | Payer: BLUE CROSS/BLUE SHIELD | Source: Ambulatory Visit | Attending: Student | Admitting: Student

## 2015-04-23 DIAGNOSIS — M2578 Osteophyte, vertebrae: Secondary | ICD-10-CM | POA: Diagnosis not present

## 2015-04-23 DIAGNOSIS — M5126 Other intervertebral disc displacement, lumbar region: Secondary | ICD-10-CM | POA: Diagnosis not present

## 2015-04-23 DIAGNOSIS — M5441 Lumbago with sciatica, right side: Secondary | ICD-10-CM | POA: Insufficient documentation

## 2015-04-28 ENCOUNTER — Telehealth: Payer: Self-pay

## 2015-04-28 NOTE — Telephone Encounter (Signed)
Pt needs clearance to hold plavix x 7 days before lumbar epidural injection scheduled for 10/28 Form placed in Arida's inbox

## 2015-05-07 NOTE — Telephone Encounter (Signed)
Per verbal from Ward Givenshris Berge, NP, ok for pt to hold Plavix 7 days before lumbar epidural injection. [facility], [facility]: Joellen, [phone]  S/w pt regarding holding Plavix 7 days before injection. Pt verbalized understanding

## 2015-05-21 DIAGNOSIS — G8929 Other chronic pain: Secondary | ICD-10-CM | POA: Insufficient documentation

## 2015-05-21 DIAGNOSIS — M5416 Radiculopathy, lumbar region: Secondary | ICD-10-CM | POA: Insufficient documentation

## 2015-06-26 ENCOUNTER — Other Ambulatory Visit: Payer: Self-pay | Admitting: Cardiovascular Disease

## 2015-07-20 ENCOUNTER — Ambulatory Visit (INDEPENDENT_AMBULATORY_CARE_PROVIDER_SITE_OTHER): Payer: BLUE CROSS/BLUE SHIELD | Admitting: Cardiovascular Disease

## 2015-07-20 ENCOUNTER — Encounter: Payer: Self-pay | Admitting: Cardiovascular Disease

## 2015-07-20 VITALS — BP 152/107 | HR 97 | Ht 70.0 in | Wt 345.0 lb

## 2015-07-20 DIAGNOSIS — I10 Essential (primary) hypertension: Secondary | ICD-10-CM | POA: Diagnosis not present

## 2015-07-20 DIAGNOSIS — I25119 Atherosclerotic heart disease of native coronary artery with unspecified angina pectoris: Secondary | ICD-10-CM

## 2015-07-20 DIAGNOSIS — E785 Hyperlipidemia, unspecified: Secondary | ICD-10-CM

## 2015-07-20 MED ORDER — ROSUVASTATIN CALCIUM 5 MG PO TABS: 5.0000 mg | ORAL_TABLET | Freq: Every day | ORAL | Status: DC

## 2015-07-20 NOTE — Patient Instructions (Addendum)
Medication Instructions:  Your physician has recommended you make the following change in your medication:  STOP plavix START crestor 5mg  daily   Labwork: none  Testing/Procedures: none  Follow-Up: Your physician wants you to follow-up in: six months with Dr. Kirke CorinArida.  You will receive a reminder letter in the mail two months in advance. If you don't receive a letter, please call our office to schedule the follow-up appointment.   Any Other Special Instructions Will Be Listed Below (If Applicable).     If you need a refill on your cardiac medications before your next appointment, please call your pharmacy.

## 2015-07-20 NOTE — Assessment & Plan Note (Signed)
He discontinued rosuvastatin his own due to some vague side effects that probably are not related to this medication. I elected to resume this medication at 5 mg once daily.

## 2015-07-20 NOTE — Progress Notes (Signed)
HPI  This is a 54 year old man who is here today for a followup visit. He presented in August, 2013  [facility] with a small non-ST elevation myocardial infarction. He underwent cardiac catheterization which showed severe single-vessel coronary artery disease in the left anterior descending artery. There was a 60% proximal stenosis followed by diffuse 80% disease in the midsegment and a 70% discrete stenosis distally. He underwent an angioplasty and long drug-eluting stent placement to the proximal and mid LAD without complications. The distal LAD lesion was left to be treated medically.   He did have myalgia on atorvastatin which was subsequently discontinued. He was treated with rosuvastatin but he stopped the medication on his own as he reports it was making him feel bad with some joint pain.  He is currently suffering from significant lower back pain and has been getting localized injections with minimal improvement. His physical activities has decreased significantly and he continues to gradually gained weight. His weight today is up to 345 pounds. He denies any chest pain or worsening shortness of breath.     Allergies  Allergen Reactions  . Atorvastatin Other (See Comments)    Muscle cramps     Current Outpatient Prescriptions on File Prior to Visit  Medication Sig Dispense Refill  . amLODipine (NORVASC) 10 MG tablet take 1 tablet by mouth once daily 30 tablet 3  . aspirin EC 81 MG tablet Take 81 mg by mouth daily.    . carvedilol (COREG) 6.25 MG tablet Take 1 tablet (6.25 mg total) by mouth 2 (two) times daily. 60 tablet 6  . clopidogrel (PLAVIX) 75 MG tablet Take 1 tablet (75 mg total) by mouth daily. 30 tablet 3  . lisinopril (PRINIVIL,ZESTRIL) 40 MG tablet take 1 tablet by mouth once daily 90 tablet 3  . nitroGLYCERIN (NITROSTAT) 0.4 MG SL tablet Place 0.4 mg under the tongue every 5 (five) minutes as needed for chest pain.     . pantoprazole (PROTONIX) 40 MG tablet take 1 tablet  by mouth once daily 90 tablet 3   No current facility-administered medications on file prior to visit.     Past Medical History  Diagnosis Date  . Anxiety   . Obesity   . Hypokalemia   . Coronary artery disease     a. 02/2012 NSTEMI/Cath/PCI: LAD: 60% proximal, 80% diffuse mid & 70d, nonobs LCX/RCA --> DES to the prox/mid LAD.  Marland Kitchen. Hyperlipidemia   . Hypertension   . Chicken pox   . OSA (obstructive sleep apnea) 03/27/2014     Past Surgical History  Procedure Laterality Date  . Cardiac catheterization  03/12/2012  . Coronary angioplasty  03/12/2012    s/p drug eluting stent to the mid LAD : 3.5 x 38 mm Xience drug-eluting stent. Post dilated to 4.0 in mid segment and 4.5 proximally.     Family History  Problem Relation Age of Onset  . Heart disease Mother   . Hyperlipidemia Mother   . Hypertension Mother   . Heart attack Mother 4154    CABG X 4; past at age 52  . Heart disease Sister 955    s/p stents   . Stroke Father   . Cancer Neg Hx   . Diabetes Neg Hx      Social History   Social History  . Marital Status: Widowed    Spouse Name: N/A  . Number of Children: N/A  . Years of Education: N/A   Occupational History  . Lexicographerlectronic Technician  Social History Main Topics  . Smoking status: Never Smoker   . Smokeless tobacco: Not on file  . Alcohol Use: No     Comment: biweekly 1-2 beers  . Drug Use: No  . Sexual Activity: Not Currently   Other Topics Concern  . Not on file   Social History Narrative   Lives locally with his son, who has a h/o liver failure s/p prior liver transplant.  He is pending repeat transplant.      PHYSICAL EXAM   BP 152/107 mmHg  Pulse 97  Ht  (1.778 m)  Wt 345 lb (156.491 kg)  BMI 49.50 kg/m2 Constitutional: He is oriented to person, place, and time. He appears well-developed and well-nourished. No distress.  HENT: No nasal discharge.  Head: Normocephalic and atraumatic.  Eyes: Pupils are equal and round. Right eye  exhibits no discharge. Left eye exhibits no discharge.  Neck: Normal range of motion. Neck supple. No JVD present. No thyromegaly present.  Cardiovascular: Normal rate, regular rhythm, normal heart sounds and. Exam reveals no gallop and no friction rub. No murmur heard.  Pulmonary/Chest: Effort normal and breath sounds normal. No stridor. No respiratory distress. He has no wheezes. He has no rales. He exhibits no tenderness.  Abdominal: Soft. Bowel sounds are normal. He exhibits no distension. There is no tenderness. There is no rebound and no guarding.  Musculoskeletal: Normal range of motion. He exhibits trace edema and no tenderness.  Neurological: He is alert and oriented to person, place, and time. Coordination normal.  Skin: Skin is warm and dry. No rash noted. He is not diaphoretic. No erythema. No pallor.  Psychiatric: He has a normal mood and affect. His behavior is normal. Judgment and thought content normal.    EKG: Normal sinus rhythm with no significant ST or T wave changes.   ASSESSMENT AND PLAN

## 2015-07-20 NOTE — Assessment & Plan Note (Signed)
I think this is significantly contributing to his symptoms of fatigue and lower back pain. Unfortunately, he continues to gain weight. He might need to consider surgical weight loss.

## 2015-07-20 NOTE — Assessment & Plan Note (Signed)
He is stable from a cardiac standpoint with no evidence of recurrent angina. He has been more than 3 years since drug-eluting stent placement. Thus, I discontinued Plavix today. Continue lifelong treatment with low-dose aspirin.

## 2015-07-20 NOTE — Assessment & Plan Note (Signed)
His blood pressure is elevated today but he reports that his blood pressure has been reasonably controlled lately. He is usually hesitant to increase his medications due to reported fatigue. I made no changes today.

## 2015-09-26 ENCOUNTER — Other Ambulatory Visit: Payer: Self-pay | Admitting: Cardiovascular Disease

## 2016-02-02 ENCOUNTER — Other Ambulatory Visit: Payer: Self-pay

## 2016-02-02 ENCOUNTER — Other Ambulatory Visit: Payer: Self-pay | Admitting: Cardiovascular Disease

## 2016-02-02 MED ORDER — LISINOPRIL 40 MG PO TABS: ORAL_TABLET | ORAL | Status: DC

## 2016-03-23 ENCOUNTER — Other Ambulatory Visit: Payer: Self-pay | Admitting: Cardiovascular Disease

## 2016-05-04 ENCOUNTER — Ambulatory Visit (INDEPENDENT_AMBULATORY_CARE_PROVIDER_SITE_OTHER): Payer: BLUE CROSS/BLUE SHIELD | Admitting: Cardiovascular Disease

## 2016-05-04 ENCOUNTER — Encounter: Payer: Self-pay | Admitting: Cardiovascular Disease

## 2016-05-04 VITALS — BP 130/100 | HR 77 | Ht 70.0 in | Wt 332.8 lb

## 2016-05-04 DIAGNOSIS — I25810 Atherosclerosis of coronary artery bypass graft(s) without angina pectoris: Secondary | ICD-10-CM

## 2016-05-04 DIAGNOSIS — I10 Essential (primary) hypertension: Secondary | ICD-10-CM

## 2016-05-04 DIAGNOSIS — E7800 Pure hypercholesterolemia, unspecified: Secondary | ICD-10-CM | POA: Diagnosis not present

## 2016-05-04 NOTE — Progress Notes (Signed)
Cardiology Office Note   Date:  05/04/2016   ID:  Steve Burnett, DOB 10/27/1960, MRN [id number]  PCP:  Nicki ReaperBAITY, REGINA, NP  Cardiologist:   Lorine BearsMuhammad Neveyah Garzon, MD   Chief Complaint  Patient presents with  . Follow-up    no cp or sob but patisnt states he is having some swelling in his left leg.      History of Present Illness: Steve Burnett is a 55 y.o. male who presents for A follow-up visit regarding coronary artery disease. He had non-ST elevation myocardial infarction in August 2013. Cardiac catheterization  showed severe single-vessel coronary artery disease in the left anterior descending artery. There was a 60% proximal stenosis followed by diffuse 80% disease in the midsegment and a 70% discrete stenosis distally. He underwent an angioplasty and long drug-eluting stent placement to the proximal and mid LAD without complications. The distal LAD lesion was left to be treated medically.  He has history of myalgia with statins but has been tolerating rosuvastatin. He has been doing well and denies any chest pain or shortness of breath. His blood pressure is mildly elevated. He forgets to take his evening dose of carvedilol on most days. His lower back pain improved with steroid injections and overall he is feeling better.    Past Medical History:  Diagnosis Date  . Anxiety   . Chicken pox   . Coronary artery disease    a. 02/2012 NSTEMI/Cath/PCI: LAD: 60% proximal, 80% diffuse mid & 70d, nonobs LCX/RCA --> DES to the prox/mid LAD.  Marland Kitchen. Hyperlipidemia   . Hypertension   . Hypokalemia   . Obesity   . OSA (obstructive sleep apnea) 03/27/2014    Past Surgical History:  Procedure Laterality Date  . CARDIAC CATHETERIZATION  03/12/2012  . CORONARY ANGIOPLASTY  03/12/2012   s/p drug eluting stent to the mid LAD : 3.5 x 38 mm Xience drug-eluting stent. Post dilated to 4.0 in mid segment and 4.5 proximally.     Current Outpatient Prescriptions  Medication Sig Dispense Refill  .  amLODipine (NORVASC) 10 MG tablet take 1 tablet by mouth once daily 30 tablet 6  . aspirin EC 81 MG tablet Take 81 mg by mouth daily.    . carvedilol (COREG) 6.25 MG tablet take 1 tablet by mouth twice a day 60 tablet 3  . lisinopril (PRINIVIL,ZESTRIL) 40 MG tablet take 1 tablet by mouth once daily 30 tablet 6  . nitroGLYCERIN (NITROSTAT) 0.4 MG SL tablet Place 0.4 mg under the tongue every 5 (five) minutes as needed for chest pain.     . rosuvastatin (CRESTOR) 5 MG tablet take 1 tablet by mouth once daily 30 tablet 5   No current facility-administered medications for this visit.     Allergies:   Atorvastatin    Social History:  The patient  reports that he has never smoked. He does not have any smokeless tobacco history on file. He reports that he does not drink alcohol or use drugs.   Family History:  The patient's family history includes Heart attack (age of onset: 6854) in his mother; Heart disease in his mother; Heart disease (age of onset: 4655) in his sister; Hyperlipidemia in his mother; Hypertension in his mother; Stroke in his father.    ROS:  Please see the history of present illness.   Otherwise, review of systems are positive for none.   All other systems are reviewed and negative.    PHYSICAL EXAM: VS:  BP (!) 130/100 (BP  Location: Left Arm, Patient Position: Sitting, Cuff Size: Large)   Pulse 77   Ht 5\' 10"  (1.778 m)   Wt (!) 332 lb 12.8 oz (151 kg)   BMI 47.75 kg/m  , BMI Body mass index is 47.75 kg/m. GEN: Well nourished, well developed, in no acute distress  HEENT: normal  Neck: no JVD, carotid bruits, or masses Cardiac: RRR; no murmurs, rubs, or gallops,no edema  Respiratory:  clear to auscultation bilaterally, normal work of breathing GI: soft, nontender, nondistended, + BS MS: no deformity or atrophy  Skin: warm and dry, no rash Neuro:  Strength and sensation are intact Psych: euthymic mood, full affect   EKG:  EKG is ordered today. The ekg ordered today  demonstrates normal sinus rhythm with no significant ST or T wave changes.   Recent Labs: No results found for requested labs within last 8760 hours.    Lipid Panel    Component Value Date/Time   CHOL 149 01/15/2014 0826   CHOL 167 03/11/2012 0645   TRIG 123 01/15/2014 0826   TRIG 98 03/11/2012 0645   HDL 41 01/15/2014 0826   HDL 41 03/11/2012 0645   CHOLHDL 3.6 01/15/2014 0826   VLDL 20 03/11/2012 0645   LDLCALC 83 01/15/2014 0826   LDLCALC 106 (H) 03/11/2012 0645      Wt Readings from Last 3 Encounters:  05/04/16 (!) 332 lb 12.8 oz (151 kg)  07/20/15 (!) 345 lb (156.5 kg)  03/18/15 (!) 322 lb (146.1 kg)      No flowsheet data found.    ASSESSMENT AND PLAN:  1.  Coronary artery disease involving native coronary arteries without angina: He is overall doing well with no anginal symptoms. Continue medical therapy.  2. Hyperlipidemia: No history of intolerance to statins. He is tolerating small dose rosuvastatin. I requested lipid and liver profile.  3. Essential hypertension: Blood pressure continues to be mildly elevated but is better than before. He has not been taking his evening dose of carvedilol and I asked him to do that on a regular basis before considering increasing the medication. Check basic metabolic profile today.  4. Obesity: we discussed the importance of weight loss and regular activities.    Disposition:   FU with me in 6 months  Signed,  Lorine Bears, MD  05/04/2016 4:21 PM    [facility]

## 2016-05-04 NOTE — Patient Instructions (Addendum)
Medication Instructions:  Your physician recommends that you continue on your current medications as directed. Please refer to the Current Medication list given to you today.   Labwork: Fasting lipid, liver and BMET  Testing/Procedures: none  Follow-Up: Your physician wants you to follow-up in: six months with Dr. Kirke CorinArida.  You will receive a reminder letter in the mail two months in advance. If you don't receive a letter, please call our office to schedule the follow-up appointment.   Any Other Special Instructions Will Be Listed Below (If Applicable).     If you need a refill on your cardiac medications before your next appointment, please call your pharmacy.

## 2016-05-05 ENCOUNTER — Other Ambulatory Visit: Payer: BLUE CROSS/BLUE SHIELD

## 2016-05-23 ENCOUNTER — Telehealth: Payer: Self-pay | Admitting: Cardiovascular Disease

## 2016-05-23 NOTE — Telephone Encounter (Signed)
Pt did not show for 10/13 labs.  Attempted to contact pt at home. Male who answered states he is not there. Left detailed message on cell VM requesting call back to reschedule labs.

## 2016-07-10 ENCOUNTER — Encounter: Payer: Self-pay | Admitting: Internal Medicine

## 2016-07-10 ENCOUNTER — Ambulatory Visit (INDEPENDENT_AMBULATORY_CARE_PROVIDER_SITE_OTHER): Payer: BLUE CROSS/BLUE SHIELD | Admitting: Internal Medicine

## 2016-07-10 VITALS — BP 136/98 | HR 83 | Temp 98.6°F | Wt 339.0 lb

## 2016-07-10 DIAGNOSIS — F411 Generalized anxiety disorder: Secondary | ICD-10-CM

## 2016-07-10 MED ORDER — ALPRAZOLAM 0.5 MG PO TABS: 0.5000 mg | ORAL_TABLET | Freq: Every evening | ORAL | 0 refills | Status: DC | PRN

## 2016-07-10 MED ORDER — CITALOPRAM HYDROBROMIDE 10 MG PO TABS
10.0000 mg | ORAL_TABLET | Freq: Every day | ORAL | 1 refills | Status: DC
Start: 2016-07-10 — End: 2016-08-29

## 2016-07-10 NOTE — Progress Notes (Signed)
Subjective:    Patient ID: Steve Burnett, male    DOB: April 30, 1961, 55 y.o.   MRN: [id number]  HPI  Pt presents to the clinic today to discuss anxiety. He reports over the last 2 months, he started having panic attacks in the middle of the night. He wakes up feeling short of breath and panicky. He denies dizziness, visual changes or chest pain. He thinks this is being triggered by stress from work, and his son has been in the hospital with complications from a liver transplant. He has been treated with Celexa and Xanax in the past. He reports he wants to restart the Celexa at this time. He denies depression, SI/HI.  Review of Systems      Past Medical History:  Diagnosis Date  . Anxiety   . Chicken pox   . Coronary artery disease    a. 02/2012 NSTEMI/Cath/PCI: LAD: 60% proximal, 80% diffuse mid & 70d, nonobs LCX/RCA --> DES to the prox/mid LAD.  Marland Kitchen. Hyperlipidemia   . Hypertension   . Hypokalemia   . Obesity   . OSA (obstructive sleep apnea) 03/27/2014    Current Outpatient Prescriptions  Medication Sig Dispense Refill  . amLODipine (NORVASC) 10 MG tablet take 1 tablet by mouth once daily 30 tablet 6  . aspirin EC 81 MG tablet Take 81 mg by mouth daily.    . carvedilol (COREG) 6.25 MG tablet take 1 tablet by mouth twice a day 60 tablet 3  . lisinopril (PRINIVIL,ZESTRIL) 40 MG tablet take 1 tablet by mouth once daily 30 tablet 6  . nitroGLYCERIN (NITROSTAT) 0.4 MG SL tablet Place 0.4 mg under the tongue every 5 (five) minutes as needed for chest pain.     . rosuvastatin (CRESTOR) 5 MG tablet take 1 tablet by mouth once daily 30 tablet 5   No current facility-administered medications for this visit.     Allergies  Allergen Reactions  . Atorvastatin Other (See Comments)    Muscle cramps    Family History  Problem Relation Age of Onset  . Heart disease Mother   . Hyperlipidemia Mother   . Hypertension Mother   . Heart attack Mother 954    CABG X 4; past at age 53  . Heart  disease Sister 2255    s/p stents   . Stroke Father   . Cancer Neg Hx   . Diabetes Neg Hx     Social History   Social History  . Marital status: Widowed    Spouse name: N/A  . Number of children: N/A  . Years of education: N/A   Occupational History  . Chief Financial Officerlectronic Technician [employer]   Social History Main Topics  . Smoking status: Never Smoker  . Smokeless tobacco: Not on file  . Alcohol use No     Comment: biweekly 1-2 beers  . Drug use: No  . Sexual activity: Not Currently   Other Topics Concern  . Not on file   Social History Narrative   Lives locally with his son, who has a h/o liver failure s/p prior liver transplant.  He is pending repeat transplant.     Constitutional: Denies fever, malaise, fatigue, headache or abrupt weight changes.  Respiratory: Pt reports shortness of breath. Denies difficulty breathing, cough or sputum production.   Cardiovascular: Denies chest pain, chest tightness, palpitations or swelling in the hands or feet.  Psych: Pt reports anxiety. Denies depression, SI/HI.  No other specific complaints in a complete review of systems (except  as listed in HPI above).  Objective:   Physical Exam   BP (!) 136/98   Pulse 83   Temp 98.6 F (37 C) (Oral)   Wt (!) 339 lb (153.8 kg)   SpO2 97%   BMI 48.64 kg/m  Wt Readings from Last 3 Encounters:  07/10/16 (!) 339 lb (153.8 kg)  05/04/16 (!) 332 lb 12.8 oz (151 kg)  07/20/15 (!) 345 lb (156.5 kg)    General: Appears his stated age, obese in NAD. Cardiovascular: Normal rate and rhythm. S1,S2 noted.  No murmur, rubs or gallops noted. No JVD or BLE edema. No carotid bruits noted. Pulmonary/Chest: Normal effort and positive vesicular breath sounds. No respiratory distress. No wheezes, rales or ronchi noted.  Neurological: Alert and oriented.  Psych: He does seem mildly anxious appearing today. Thought content and judgement seem normal.   BMET    Component Value Date/Time   NA 138 02/02/2014  0718   K 3.9 02/02/2014 0718   CL 105 02/02/2014 0718   CO2 25 02/02/2014 0718   GLUCOSE 114 (H) 02/02/2014 0718   BUN 16 02/02/2014 0718   CREATININE 0.83 02/02/2014 0718   CALCIUM 8.5 02/02/2014 0718   GFRNONAA >60 02/02/2014 0718   GFRAA >60 02/02/2014 0718    Lipid Panel     Component Value Date/Time   CHOL 149 01/15/2014 0826   CHOL 167 03/11/2012 0645   TRIG 123 01/15/2014 0826   TRIG 98 03/11/2012 0645   HDL 41 01/15/2014 0826   HDL 41 03/11/2012 0645   CHOLHDL 3.6 01/15/2014 0826   VLDL 20 03/11/2012 0645   LDLCALC 83 01/15/2014 0826   LDLCALC 106 (H) 03/11/2012 0645    CBC    Component Value Date/Time   WBC 6.2 02/02/2014 0718   WBC 6.9 12/06/2012 1248   RBC 5.14 02/02/2014 0718   RBC 5.40 12/06/2012 1248   HGB 13.4 02/02/2014 0718   HCT 41.1 02/02/2014 0718   PLT 233 02/02/2014 0718   MCV 80 02/02/2014 0718   MCH 26.0 02/02/2014 0718   MCH 25.2 (L) 12/06/2012 1248   MCHC 32.5 02/02/2014 0718   MCHC 33.6 12/06/2012 1248   RDW 15.7 (H) 02/02/2014 0718   LYMPHSABS 1.4 12/06/2012 1248   LYMPHSABS 0.9 (L) 03/10/2012 1240   MONOABS 0.4 12/06/2012 1248   MONOABS 0.4 03/10/2012 1240   EOSABS 0.2 12/06/2012 1248   EOSABS 0.0 03/10/2012 1240   BASOSABS 0.0 12/06/2012 1248   BASOSABS 0.0 03/10/2012 1240    Hgb A1C Lab Results  Component Value Date   HGBA1C 6.1 03/11/2012           Assessment & Plan:

## 2016-07-10 NOTE — Patient Instructions (Signed)
Panic Attacks Panic attacks are sudden, short feelings of great fear or discomfort. You may have them for no reason when you are relaxed, when you are uneasy (anxious), or when you are sleeping. Follow these instructions at home:  Take all your medicines as told.  Check with your doctor before starting new medicines.  Keep all doctor visits. Contact a doctor if:  You are not able to take your medicines as told.  Your symptoms do not get better.  Your symptoms get worse. Get help right away if:  Your attacks seem different than your normal attacks.  You have thoughts about hurting yourself or others.  You take panic attack medicine and you have a side effect. This information is not intended to replace advice given to you by your health care provider. Make sure you discuss any questions you have with your health care provider. Document Released: 08/12/2010 Document Revised: 12/16/2015 Document Reviewed: 02/21/2013 Elsevier Interactive Patient Education  2017 [employer]  

## 2016-07-10 NOTE — Assessment & Plan Note (Signed)
Deteriorated Support offered today Discussed stress management techniques such as distraction and deep breathing He declines referral for CBT at this time eRx for Celexa 10 mg daily RX for Xanax 0.5 mg daily prn, discussed not using this on a daily basis  RTC in 6 weeks to follow up depression/annual exam

## 2016-08-07 ENCOUNTER — Other Ambulatory Visit: Payer: Self-pay | Admitting: Cardiovascular Disease

## 2016-08-07 ENCOUNTER — Other Ambulatory Visit: Payer: Self-pay | Admitting: Internal Medicine

## 2016-08-07 DIAGNOSIS — F411 Generalized anxiety disorder: Secondary | ICD-10-CM

## 2016-08-07 NOTE — Telephone Encounter (Signed)
OK to phone in Alprazolam on 08/10/16

## 2016-08-07 NOTE — Telephone Encounter (Signed)
Last filled 07/10/16--please advise

## 2016-08-08 ENCOUNTER — Ambulatory Visit (INDEPENDENT_AMBULATORY_CARE_PROVIDER_SITE_OTHER): Payer: BLUE CROSS/BLUE SHIELD | Admitting: Internal Medicine

## 2016-08-08 ENCOUNTER — Encounter: Payer: Self-pay | Admitting: Internal Medicine

## 2016-08-08 VITALS — BP 142/88 | HR 74 | Temp 98.0°F | Ht 69.5 in | Wt 345.0 lb

## 2016-08-08 DIAGNOSIS — G4733 Obstructive sleep apnea (adult) (pediatric): Secondary | ICD-10-CM | POA: Diagnosis not present

## 2016-08-08 DIAGNOSIS — Z125 Encounter for screening for malignant neoplasm of prostate: Secondary | ICD-10-CM

## 2016-08-08 DIAGNOSIS — Z0001 Encounter for general adult medical examination with abnormal findings: Secondary | ICD-10-CM | POA: Diagnosis not present

## 2016-08-08 DIAGNOSIS — I10 Essential (primary) hypertension: Secondary | ICD-10-CM

## 2016-08-08 DIAGNOSIS — E7800 Pure hypercholesterolemia, unspecified: Secondary | ICD-10-CM

## 2016-08-08 DIAGNOSIS — I25119 Atherosclerotic heart disease of native coronary artery with unspecified angina pectoris: Secondary | ICD-10-CM | POA: Diagnosis not present

## 2016-08-08 DIAGNOSIS — F411 Generalized anxiety disorder: Secondary | ICD-10-CM | POA: Diagnosis not present

## 2016-08-08 LAB — COMPREHENSIVE METABOLIC PANEL
ALBUMIN: 4.1 g/dL (ref 3.5–5.2)
ALK PHOS: 66 U/L (ref 39–117)
ALT: 34 U/L (ref 0–53)
AST: 23 U/L (ref 0–37)
BUN: 11 mg/dL (ref 6–23)
CALCIUM: 9.1 mg/dL (ref 8.4–10.5)
CO2: 29 mEq/L (ref 19–32)
Chloride: 103 mEq/L (ref 96–112)
Creatinine, Ser: 0.82 mg/dL (ref 0.40–1.50)
GFR: 104.37 mL/min (ref >60.00)
GLUCOSE: 74 mg/dL (ref 70–99)
POTASSIUM: 4.3 meq/L (ref 3.5–5.1)
Sodium: 139 mEq/L (ref 135–145)
TOTAL PROTEIN: 6.8 g/dL (ref 6.0–8.3)
Total Bilirubin: 0.4 mg/dL (ref 0.2–1.2)

## 2016-08-08 LAB — LDL CHOLESTEROL, DIRECT: LDL DIRECT: 79 mg/dL

## 2016-08-08 LAB — CBC
HEMATOCRIT: 42.9 % (ref 39.0–52.0)
Hemoglobin: 14.2 g/dL (ref 13.0–17.0)
MCHC: 33.1 g/dL (ref 30.0–36.0)
MCV: 77.7 fl — ABNORMAL LOW (ref 78.0–100.0)
PLATELETS: 320 10*3/uL (ref 150.0–400.0)
RBC: 5.52 Mil/uL (ref 4.22–5.81)
RDW: 15.5 % (ref 11.5–15.5)
WBC: 7.3 10*3/uL (ref 4.0–10.5)

## 2016-08-08 LAB — HEMOGLOBIN A1C: Hgb A1c MFr Bld: 6.4 % (ref 4.6–6.5)

## 2016-08-08 LAB — LIPID PANEL
CHOLESTEROL: 142 mg/dL (ref 0–200)
HDL: 37.5 mg/dL — AB (ref >39.00)
NONHDL: 104.28
Total CHOL/HDL Ratio: 4
Triglycerides: 230 mg/dL — ABNORMAL HIGH (ref 0.0–149.0)
VLDL: 46 mg/dL — ABNORMAL HIGH (ref 0.0–40.0)

## 2016-08-08 LAB — PSA: PSA: 0.4 ng/mL (ref 0.10–4.00)

## 2016-08-08 LAB — TSH: TSH: 1.15 u[IU]/mL (ref 0.35–4.50)

## 2016-08-08 MED ORDER — ALPRAZOLAM 0.5 MG PO TABS: ORAL_TABLET | ORAL | 0 refills | Status: DC

## 2016-08-08 NOTE — Progress Notes (Signed)
Subjective:    Patient ID: Steve Burnett, male    DOB: [birth year], 56 y.o.   MRN: [id number]  HPI  Pt presents to the clinic today for his annual exam. He is also due to follow up chronic conditions.  Anxiety: He was recently restarted on Celexa. He has noticed a big difference in his anxiety level. He reports he is currently out of the Xanax but feels like he would need to take it 1-2 times per week. He is requesting a refill of Xanax today.   HLD with CAD s/p STEMI: His last LDL was 83. He is taking Crestor as prescribed. He does consume fried foods. He denies chest pain.   HTN: His blood pressure today is 142/88. He is taking Coreg, Norvasc and Lisinopril as prescribed. He reports this BP today is good for him.  OSA: He is wearing his CPAP. He is getting 6-7 hours of sleep per night. He does not feel fatigued throughout the day.  Flu: 04/2016 Tetanus: 08/2014 PSA: never Colon Screening: never Vision Screening: as needed Dentist: annually  Diet: He does eat meat. He consumes fruits and veggies daily. He does eat fried food. He drinks coffee and sweet tea.  Review of Systems      Past Medical History:  Diagnosis Date  . Anxiety   . Chicken pox   . Coronary artery disease    a. 02/2012 NSTEMI/Cath/PCI: LAD: 60% proximal, 80% diffuse mid & 70d, nonobs LCX/RCA --> DES to the prox/mid LAD.  Marland Kitchen Hyperlipidemia   . Hypertension   . Hypokalemia   . Obesity   . OSA (obstructive sleep apnea) 03/27/2014    Current Outpatient Prescriptions  Medication Sig Dispense Refill  . ALPRAZolam (XANAX) 0.5 MG tablet TAKE ONE TABLET BY MOUTH AT BEDTIME AS NEEDED FOR ANXIETY 20 tablet 0  . amLODipine (NORVASC) 10 MG tablet take 1 tablet by mouth once daily 30 tablet 6  . aspirin EC 81 MG tablet Take 81 mg by mouth daily.    . carvedilol (COREG) 6.25 MG tablet TAKE 1 TABLET BY MOUTH TWICE A DAY 60 tablet 3  . citalopram (CELEXA) 10 MG tablet Take 1 tablet (10 mg total) by mouth daily. 30  tablet 1  . lisinopril (PRINIVIL,ZESTRIL) 40 MG tablet take 1 tablet by mouth once daily 30 tablet 6  . nitroGLYCERIN (NITROSTAT) 0.4 MG SL tablet Place 0.4 mg under the tongue every 5 (five) minutes as needed for chest pain.     . rosuvastatin (CRESTOR) 5 MG tablet take 1 tablet by mouth once daily 30 tablet 5   No current facility-administered medications for this visit.     Allergies  Allergen Reactions  . Atorvastatin Other (See Comments)    Muscle cramps    Family History  Problem Relation Age of Onset  . Heart disease Mother   . Hyperlipidemia Mother   . Hypertension Mother   . Heart attack Mother 22    CABG X 4; past at age 30  . Heart disease Sister 70    s/p stents   . Stroke Father   . Cancer Neg Hx   . Diabetes Neg Hx     Social History   Social History  . Marital status: Widowed    Spouse name: N/A  . Number of children: N/A  . Years of education: N/A   Occupational History  . Chief Financial Officer   Social History Main Topics  . Smoking status: Never Smoker  . Smokeless  tobacco: Never Used  . Alcohol use No     Comment: biweekly 1-2 beers  . Drug use: No  . Sexual activity: Not Currently   Other Topics Concern  . Not on file   Social History Narrative   Lives locally with his son, who has a h/o liver failure s/p prior liver transplant.  He is pending repeat transplant.     Constitutional: Pt reports weight gain. Denies fever, malaise, fatigue, headache.  HEENT: Denies eye pain, eye redness, ear pain, ringing in the ears, wax buildup, runny nose, nasal congestion, bloody nose, or sore throat. Respiratory: Denies difficulty breathing, shortness of breath, cough or sputum production.   Cardiovascular: Denies chest pain, chest tightness, palpitations or swelling in the hands or feet.  Gastrointestinal: Denies abdominal pain, bloating, constipation, diarrhea or blood in the stool.  GU: Denies urgency, frequency, pain with urination, burning  sensation, blood in urine, odor or discharge. Musculoskeletal: Pt reports intermittent low back pain (none today). Denies decrease in range of motion, difficulty with gait, muscle pain or joint pain and swelling.  Skin: Denies redness, rashes, lesions or ulcercations.  Neurological: Denies dizziness, difficulty with memory, difficulty with speech or problems with balance and coordination.  Psych: Pt reports anxiety. Denies depression, SI/HI.  No other specific complaints in a complete review of systems (except as listed in HPI above).  Objective:   Physical Exam   BP (!) 142/88   Pulse 74   Temp 98 F (36.7 C) (Oral)   Ht 5' 9.5" (1.765 m)   Wt (!) 345 lb (156.5 kg)   SpO2 97%   BMI 50.22 kg/m  Wt Readings from Last 3 Encounters:  08/08/16 (!) 345 lb (156.5 kg)  07/10/16 (!) 339 lb (153.8 kg)  05/04/16 (!) 332 lb 12.8 oz (151 kg)    General: Appears his stated age, obese in NAD. Skin: Warm, dry and intact.  HEENT: Head: normal shape and size; Eyes: sclera white, no icterus, conjunctiva pink, PERRLA and EOMs intact; Ears: Tm's gray and intact, normal light reflex; Throat/Mouth: Teeth present, mucosa pink and moist, no exudate, lesions or ulcerations noted.  Neck:  Neck supple, trachea midline. No masses, lumps or thyromegaly present.  Cardiovascular: Normal rate and rhythm. S1,S2 noted.  No murmur, rubs or gallops noted. No JVD or BLE edema. No carotid bruits noted. Pulmonary/Chest: Normal effort and positive vesicular breath sounds. No respiratory distress. No wheezes, rales or ronchi noted.  Abdomen: Soft and nontender. Normal bowel sounds. No distention or masses noted. Liver, spleen and kidneys non palpable. Musculoskeletal: Normal range of motion. Strength 5/5 BUE/BLE. No difficulty with gait.  Neurological: Alert and oriented. Cranial nerves II-XII grossly intact. Coordination normal.  Psychiatric: Mood and affect normal. Behavior is normal. Judgment and thought content  normal.   EKG:  BMET    Component Value Date/Time   NA 139 08/08/2016 1540   NA 138 02/02/2014 0718   K 4.3 08/08/2016 1540   K 3.9 02/02/2014 0718   CL 103 08/08/2016 1540   CL 105 02/02/2014 0718   CO2 29 08/08/2016 1540   CO2 25 02/02/2014 0718   GLUCOSE 74 08/08/2016 1540   GLUCOSE 114 (H) 02/02/2014 0718   BUN 11 08/08/2016 1540   BUN 16 02/02/2014 0718   CREATININE 0.82 08/08/2016 1540   CREATININE 0.83 02/02/2014 0718   CALCIUM 9.1 08/08/2016 1540   CALCIUM 8.5 02/02/2014 0718   GFRNONAA >60 02/02/2014 0718   GFRAA >60 02/02/2014 16100718  Lipid Panel     Component Value Date/Time   CHOL 142 08/08/2016 1540   CHOL 149 01/15/2014 0826   CHOL 167 03/11/2012 0645   TRIG 230.0 (H) 08/08/2016 1540   TRIG 98 03/11/2012 0645   HDL 37.50 (L) 08/08/2016 1540   HDL 41 01/15/2014 0826   HDL 41 03/11/2012 0645   CHOLHDL 4 08/08/2016 1540   VLDL 46.0 (H) 08/08/2016 1540   VLDL 20 03/11/2012 0645   LDLCALC 83 01/15/2014 0826   LDLCALC 106 (H) 03/11/2012 0645    CBC    Component Value Date/Time   WBC 7.3 08/08/2016 1540   RBC 5.52 08/08/2016 1540   HGB 14.2 08/08/2016 1540   HGB 13.4 02/02/2014 0718   HCT 42.9 08/08/2016 1540   HCT 41.1 02/02/2014 0718   PLT 320.0 08/08/2016 1540   PLT 233 02/02/2014 0718   MCV 77.7 (L) 08/08/2016 1540   MCV 80 02/02/2014 0718   MCH 26.0 02/02/2014 0718   MCH 25.2 (L) 12/06/2012 1248   MCHC 33.1 08/08/2016 1540   RDW 15.5 08/08/2016 1540   RDW 15.7 (H) 02/02/2014 0718   LYMPHSABS 1.4 12/06/2012 1248   LYMPHSABS 0.9 (L) 03/10/2012 1240   MONOABS 0.4 12/06/2012 1248   MONOABS 0.4 03/10/2012 1240   EOSABS 0.2 12/06/2012 1248   EOSABS 0.0 03/10/2012 1240   BASOSABS 0.0 12/06/2012 1248   BASOSABS 0.0 03/10/2012 1240    Hgb A1C Lab Results  Component Value Date   HGBA1C 6.4 08/08/2016       Assessment & Plan:   Preventative Health Maintenance:  Flu and tetanus UTD PSA will be done with labs today He declines  colonoscopy but is agreeable to Cologuard- ordered Encouraged him to consume a balanced diet and start an exercise regimen Advisedhim to see an eye doctor and dentist annualy Will check CBC, CMET, Lipid, TSH, A1C, PSA today [name], NP

## 2016-08-08 NOTE — Patient Instructions (Signed)

## 2016-08-10 ENCOUNTER — Encounter: Payer: Self-pay | Admitting: Internal Medicine

## 2016-08-10 NOTE — Assessment & Plan Note (Signed)
No angina CMET and Lipid profile today Encouraged him to consume a low fat diet Continue Crestor for now

## 2016-08-10 NOTE — Assessment & Plan Note (Signed)
He will continue to wear CPAP for now Discussed how weight loss would help his OSA

## 2016-08-10 NOTE — Assessment & Plan Note (Signed)
Improved on Celexa Xanax refilled today Will monitor

## 2016-08-10 NOTE — Assessment & Plan Note (Signed)
Uncontrolled Discussed the importance of weight loss with proper diet and exercise He will continue Carvedilol, Norvasc and Lisinopril I will discuss with Dr. Kirke CorinArida what his recommendations would be for tighter BP control

## 2016-08-10 NOTE — Assessment & Plan Note (Signed)
Encouraged him to consume a low fat diet CMET and Lipid profile today Continue Crestor for now

## 2016-08-10 NOTE — Assessment & Plan Note (Signed)
Encouraged him to work on diet and exercise 

## 2016-08-11 NOTE — Progress Notes (Signed)
Call pt:  I talked with Dr. Kirke CorinArida. He wants to increase Carvedilol to 12.5 mg BID, send in new RX. He really needs to exercise to lose some weight.

## 2016-08-11 NOTE — Telephone Encounter (Signed)
Rx was printed and given to pt at [facility] on 08/08/2016-- no need to call in

## 2016-08-16 ENCOUNTER — Telehealth: Payer: Self-pay

## 2016-08-16 MED ORDER — CARVEDILOL 12.5 MG PO TABS: 12.5000 mg | ORAL_TABLET | Freq: Two times a day (BID) | ORAL | 2 refills | Status: DC

## 2016-08-16 NOTE — Addendum Note (Signed)
Addended by: Roena MaladyEVONTENNO, Randal Goens Y on: 08/16/2016 11:26 AM   Modules accepted: Orders

## 2016-08-16 NOTE — Telephone Encounter (Signed)
cologuard order has been faxed along with insurance card and demographics sheet

## 2016-08-29 ENCOUNTER — Encounter: Payer: Self-pay | Admitting: Internal Medicine

## 2016-08-29 MED ORDER — CITALOPRAM HYDROBROMIDE 20 MG PO TABS: 20.0000 mg | ORAL_TABLET | Freq: Every day | ORAL | 2 refills | Status: DC

## 2016-09-15 ENCOUNTER — Other Ambulatory Visit: Payer: Self-pay | Admitting: Internal Medicine

## 2016-09-15 ENCOUNTER — Encounter: Payer: Self-pay | Admitting: Internal Medicine

## 2016-09-15 MED ORDER — PREDNISONE 10 MG PO TABS: ORAL_TABLET | ORAL | 0 refills | Status: DC

## 2016-09-20 DIAGNOSIS — H43811 Vitreous degeneration, right eye: Secondary | ICD-10-CM | POA: Diagnosis not present

## 2016-10-02 ENCOUNTER — Other Ambulatory Visit: Payer: Self-pay | Admitting: Cardiovascular Disease

## 2016-10-02 NOTE — Telephone Encounter (Signed)
Pt needs f/u appt with Arida for next month. Thanks!

## 2016-10-11 ENCOUNTER — Other Ambulatory Visit: Payer: Self-pay | Admitting: Internal Medicine

## 2016-10-11 DIAGNOSIS — F411 Generalized anxiety disorder: Secondary | ICD-10-CM

## 2016-10-11 MED ORDER — ALPRAZOLAM 0.5 MG PO TABS: ORAL_TABLET | ORAL | 0 refills | Status: DC

## 2016-10-11 NOTE — Telephone Encounter (Signed)
Ok to phone in Xanax 

## 2016-10-11 NOTE — Telephone Encounter (Signed)
Rx called in to pharmacy. 

## 2016-10-11 NOTE — Telephone Encounter (Signed)
Last filled 08/08/16--please advise

## 2016-10-30 ENCOUNTER — Other Ambulatory Visit: Payer: Self-pay | Admitting: Cardiovascular Disease

## 2016-11-21 ENCOUNTER — Ambulatory Visit (INDEPENDENT_AMBULATORY_CARE_PROVIDER_SITE_OTHER): Payer: BLUE CROSS/BLUE SHIELD | Admitting: Cardiovascular Disease

## 2016-11-21 ENCOUNTER — Encounter: Payer: Self-pay | Admitting: Cardiovascular Disease

## 2016-11-21 VITALS — BP 148/98 | HR 68 | Ht 70.0 in | Wt 327.5 lb

## 2016-11-21 DIAGNOSIS — E7800 Pure hypercholesterolemia, unspecified: Secondary | ICD-10-CM

## 2016-11-21 DIAGNOSIS — I2510 Atherosclerotic heart disease of native coronary artery without angina pectoris: Secondary | ICD-10-CM | POA: Diagnosis not present

## 2016-11-21 DIAGNOSIS — I10 Essential (primary) hypertension: Secondary | ICD-10-CM | POA: Diagnosis not present

## 2016-11-21 MED ORDER — ROSUVASTATIN CALCIUM 10 MG PO TABS: 10.0000 mg | ORAL_TABLET | Freq: Every day | ORAL | 11 refills | Status: DC

## 2016-11-21 NOTE — Progress Notes (Signed)
Cardiology Office Note   Date:  11/21/2016   ID:  Steve Burnett, DOB March 23, 1961, MRN [id number]  PCP:  [name], NP  Cardiologist:   Lorine Bears, MD   Chief Complaint  Patient presents with  . other    36mo f/u Pt  Reviewed meds with pt verbally.      History of Present Illness: Steve Burnett is a 56 y.o. male who presents for a follow-up visit regarding coronary artery disease. He had non-ST elevation myocardial infarction in August 2013. Cardiac catheterization showed severe single-vessel coronary artery disease in the left anterior descending artery. There was a 60% proximal stenosis followed by diffuse 80% disease in the midsegment and a 70% discrete stenosis distally. He underwent an angioplasty and long drug-eluting stent placement to the proximal and mid LAD without complications. The distal LAD lesion was left to be treated medically.   He has lower back pain that improved with steroid injection. He was able to lose some weight since last visit with improved diet. Blood pressure is mildly elevated today. He had very high salt intake with lunch today. He checks his blood pressure at home and usually the blood pressure is around 140/80. He does not feel well when blood pressure goes below 140 systolic. He denies any chest tightness or worsening dyspnea. No significant leg edema. She does have sleep apnea and uses CPAP a regular basis.  Past Medical History:  Diagnosis Date  . Anxiety   . Chicken pox   . Coronary artery disease    a. 02/2012 NSTEMI/Cath/PCI: LAD: 60% proximal, 80% diffuse mid & 70d, nonobs LCX/RCA --> DES to the prox/mid LAD.  Marland Kitchen Hyperlipidemia   . Hypertension   . Hypokalemia   . Obesity   . OSA (obstructive sleep apnea) 03/27/2014    Past Surgical History:  Procedure Laterality Date  . CARDIAC CATHETERIZATION  03/12/2012  . CORONARY ANGIOPLASTY  03/12/2012   s/p drug eluting stent to the mid LAD : 3.5 x 38 mm Xience drug-eluting stent. Post  dilated to 4.0 in mid segment and 4.5 proximally.     Current Outpatient Prescriptions  Medication Sig Dispense Refill  . ALPRAZolam (XANAX) 0.5 MG tablet TAKE ONE TABLET BY MOUTH AT BEDTIME AS NEEDED FOR ANXIETY 20 tablet 0  . amLODipine (NORVASC) 10 MG tablet TAKE 1 TABLET BY MOUTH EVERY DAY 30 tablet 3  . aspirin EC 81 MG tablet Take 81 mg by mouth daily.    . carvedilol (COREG) 12.5 MG tablet Take 1 tablet (12.5 mg total) by mouth 2 (two) times daily with a meal. (Patient taking differently: Take 6.25 mg by mouth 2 (two) times daily with a meal. ) 60 tablet 2  . citalopram (CELEXA) 20 MG tablet Take 1 tablet (20 mg total) by mouth daily. 30 tablet 2  . lisinopril (PRINIVIL,ZESTRIL) 40 MG tablet TAKE 1 TABLET BY MOUTH EVERY DAY 30 tablet 3  . nitroGLYCERIN (NITROSTAT) 0.4 MG SL tablet Place 0.4 mg under the tongue every 5 (five) minutes as needed for chest pain.     . predniSONE (DELTASONE) 10 MG tablet Take 6 tabs day 1, 5 tabs day 2, 4 tabs day 3, 3 tabs day 4, 2 tabs day 5, 1 tab day 6 21 tablet 0  . rosuvastatin (CRESTOR) 5 MG tablet TAKE 1 TABLET BY MOUTH EVERY DAY 30 tablet 1  . acetaminophen (TYLENOL) 650 MG CR tablet Take 650 mg by mouth as needed.    Marland Kitchen ibuprofen (ADVIL,MOTRIN)  200 MG tablet Take 200 mg by mouth as needed.     No current facility-administered medications for this visit.     Allergies:   Atorvastatin    Social History:  The patient  reports that he has never smoked. He has never used smokeless tobacco. He reports that he drinks about 1.2 oz of alcohol per week . He reports that he does not use drugs.   Family History:  The patient's family history includes Heart attack (age of onset: 35) in his mother; Heart disease in his mother; Heart disease (age of onset: 48) in his sister; Hyperlipidemia in his mother; Hypertension in his mother; Stroke in his father.    ROS:  Please see the history of present illness.   Otherwise, review of systems are positive for none.    All other systems are reviewed and negative.    PHYSICAL EXAM: VS:  BP (!) 148/98 (BP Location: Left Arm, Patient Position: Sitting, Cuff Size: Large)   Pulse 68   Ht  (1.778 m)   Wt (!) 327 lb 8 oz (148.6 kg)   BMI 46.99 kg/m  , BMI Body mass index is 46.99 kg/m. GEN: Well nourished, well developed, in no acute distress  HEENT: normal  Neck: no JVD, carotid bruits, or masses Cardiac: RRR; no murmurs, rubs, or gallops,no edema  Respiratory:  clear to auscultation bilaterally, normal work of breathing GI: soft, nontender, nondistended, + BS MS: no deformity or atrophy  Skin: warm and dry, no rash Neuro:  Strength and sensation are intact Psych: euthymic mood, full affect   EKG:  EKG is ordered today. The ekg ordered today demonstrates normal sinus rhythm with no significant ST or T wave changes.   Recent Labs: 08/08/2016: ALT 34; BUN 11; Creatinine, Ser 0.82; Hemoglobin 14.2; Platelets 320.0; Potassium 4.3; Sodium 139; TSH 1.15    Lipid Panel    Component Value Date/Time   CHOL 142 08/08/2016 1540   CHOL 149 01/15/2014 0826   CHOL 167 03/11/2012 0645   TRIG 230.0 (H) 08/08/2016 1540   TRIG 98 03/11/2012 0645   HDL 37.50 (L) 08/08/2016 1540   HDL 41 01/15/2014 0826   HDL 41 03/11/2012 0645   CHOLHDL 4 08/08/2016 1540   VLDL 46.0 (H) 08/08/2016 1540   VLDL 20 03/11/2012 0645   LDLCALC 83 01/15/2014 0826   LDLCALC 106 (H) 03/11/2012 0645   LDLDIRECT 79.0 08/08/2016 1540      Wt Readings from Last 3 Encounters:  11/21/16 (!) 327 lb 8 oz (148.6 kg)  08/08/16 (!) 345 lb (156.5 kg)  07/10/16 (!) 339 lb (153.8 kg)      No flowsheet data found.    ASSESSMENT AND PLAN:  1.  Coronary artery disease involving native coronary arteries without angina: He is overall doing well with no anginal symptoms. Continue medical therapy.  2. Hyperlipidemia:  I reviewed most recent lipid profile which showed an LDL of 79. Triglyceride was also mildly elevated. He has  known history of intolerance to statins due to myalgia but has been tolerating 5 mg rosuvastatin. I elected to increase the dose to 10 mg daily.  3. Essential hypertension: Blood pressure continues to be mildly elevated. I suggested adding a thiazide diuretic but the patient doesn't want to go this route given that he feels tired when systolic blood pressure goes below 140. We did discuss the importance of continued walking on healthy diet and attempting weight loss.  4. Obesity: we discussed the importance  of weight loss and regular activities.  5. Obstructive sleep apnea: He is compliant with CPAP treatment.  Disposition:   FU with me in 9 months  Signed,  Lorine Bears, MD  11/21/2016 4:36 PM    [facility]

## 2016-11-21 NOTE — Patient Instructions (Signed)
Medication Instructions:  Your physician has recommended you make the following change in your medication:  INCREASE rosuvastatin to  once daily   Labwork: none  Testing/Procedures: none  Follow-Up: Your physician wants you to follow-up in: 9 months with Dr. Kirke Corin.  You will receive a reminder letter in the mail two months in advance. If you don't receive a letter, please call our office to schedule the follow-up appointment.   Any Other Special Instructions Will Be Listed Below (If Applicable).     If you need a refill on your cardiac medications before your next appointment, please call your pharmacy.

## 2016-11-30 ENCOUNTER — Other Ambulatory Visit: Payer: Self-pay | Admitting: *Deleted

## 2016-11-30 MED ORDER — CITALOPRAM HYDROBROMIDE 20 MG PO TABS: 20.0000 mg | ORAL_TABLET | Freq: Every day | ORAL | 2 refills | Status: DC

## 2016-11-30 MED ORDER — CARVEDILOL 12.5 MG PO TABS: 12.5000 mg | ORAL_TABLET | Freq: Two times a day (BID) | ORAL | 2 refills | Status: DC

## 2016-12-15 ENCOUNTER — Other Ambulatory Visit: Payer: Self-pay | Admitting: Internal Medicine

## 2016-12-15 DIAGNOSIS — F411 Generalized anxiety disorder: Secondary | ICD-10-CM

## 2016-12-16 ENCOUNTER — Other Ambulatory Visit: Payer: Self-pay | Admitting: Cardiovascular Disease

## 2016-12-19 MED ORDER — ALPRAZOLAM 0.5 MG PO TABS: ORAL_TABLET | ORAL | 0 refills | Status: DC

## 2016-12-19 NOTE — Telephone Encounter (Signed)
Last filled 10/11/16-- please advise

## 2016-12-19 NOTE — Telephone Encounter (Signed)
Patient is requesting a refill on Crestor 5MG  tablets. It looks like at last visit Crestor was increased to 10MG  tablets  And was sent in 11/21/16 with 11 refills. Please advise.

## 2016-12-19 NOTE — Telephone Encounter (Signed)
Rx called in to requested pharmacy 

## 2016-12-19 NOTE — Telephone Encounter (Signed)
Ok to phone in Xanax 

## 2017-02-05 ENCOUNTER — Other Ambulatory Visit: Payer: Self-pay | Admitting: Cardiovascular Disease

## 2017-03-07 ENCOUNTER — Other Ambulatory Visit: Payer: Self-pay | Admitting: Cardiovascular Disease

## 2017-03-07 ENCOUNTER — Other Ambulatory Visit: Payer: Self-pay | Admitting: Internal Medicine

## 2017-03-13 ENCOUNTER — Other Ambulatory Visit: Payer: Self-pay | Admitting: Internal Medicine

## 2017-03-13 DIAGNOSIS — F411 Generalized anxiety disorder: Secondary | ICD-10-CM

## 2017-03-13 MED ORDER — ALPRAZOLAM 0.5 MG PO TABS: ORAL_TABLET | ORAL | 0 refills | Status: DC

## 2017-03-13 NOTE — Telephone Encounter (Signed)
Xanax last filled 12/19/16... Please advise

## 2017-03-13 NOTE — Telephone Encounter (Signed)
Ok to phone in Xanax. Prednisone will not be refilled without appt. Make sure his appt on 9/5 is a 30 minute appt

## 2017-03-13 NOTE — Telephone Encounter (Signed)
Rx called in to pharmacy. 

## 2017-03-28 ENCOUNTER — Ambulatory Visit (INDEPENDENT_AMBULATORY_CARE_PROVIDER_SITE_OTHER): Payer: BLUE CROSS/BLUE SHIELD | Admitting: Internal Medicine

## 2017-03-28 ENCOUNTER — Encounter: Payer: Self-pay | Admitting: Internal Medicine

## 2017-03-28 VITALS — BP 140/94 | HR 70 | Temp 98.0°F | Wt 336.0 lb

## 2017-03-28 DIAGNOSIS — M79642 Pain in left hand: Secondary | ICD-10-CM

## 2017-03-28 DIAGNOSIS — R29898 Other symptoms and signs involving the musculoskeletal system: Secondary | ICD-10-CM | POA: Diagnosis not present

## 2017-03-28 DIAGNOSIS — M79641 Pain in right hand: Secondary | ICD-10-CM | POA: Diagnosis not present

## 2017-03-28 DIAGNOSIS — R209 Unspecified disturbances of skin sensation: Secondary | ICD-10-CM

## 2017-03-28 MED ORDER — GABAPENTIN 100 MG PO CAPS: 100.0000 mg | ORAL_CAPSULE | Freq: Three times a day (TID) | ORAL | 0 refills | Status: DC

## 2017-03-28 NOTE — Progress Notes (Signed)
Subjective:    Patient ID: Steve Burnett, male    DOB: [birth year], 56 y.o.   MRN: [id number]  HPI  Pt presents to the clinic today with multiple complaints.  1- He c/o an abnormal sensation of his upper mid back. This started 1-2 months ago. He reports the sensation is more of like itching, but scratching doesn't seem to make it go away. He has not noticed a rash in that area. He reports this sensation occurs when he moves in certain positions.  He has taken Ibuprofen 800 mg with some relief. He reports this psensation is different from his typical sciatica pain.  2- He reports "twitching" of his left elbow. He reports this occurred about 1 week ago. He felt like something was crawling on his skin, but he never saw anything. He reports he had his wife to look at his elbow to see if anything was crawling on him, and she said his elbow was twitching. He has not taken anything OTC for this.  3- He reports abnormal sensation of his bilateral palms. He describes it as more a stiffness, but denies having issues holding things. He denies numbness, tingling, burning or weakness. He reports he has had surgery on bilateral thumbs in the past, and not sure if this is a contributing factor or not. He has not taken anything OTC for his symptoms.   Review of Systems   Past Medical History:  Diagnosis Date  . Anxiety   . Chicken pox   . Coronary artery disease    a. 02/2012 NSTEMI/Cath/PCI: LAD: 60% proximal, 80% diffuse mid & 70d, nonobs LCX/RCA --> DES to the prox/mid LAD.  Marland Kitchen. Hyperlipidemia   . Hypertension   . Hypokalemia   . Obesity   . OSA (obstructive sleep apnea) 03/27/2014    Current Outpatient Prescriptions  Medication Sig Dispense Refill  . acetaminophen (TYLENOL) 650 MG CR tablet Take 650 mg by mouth as needed.    . ALPRAZolam (XANAX) 0.5 MG tablet TAKE ONE TABLET BY MOUTH AT BEDTIME AS NEEDED FOR ANXIETY 20 tablet 0  . amLODipine (NORVASC) 10 MG tablet TAKE 1 TABLET BY MOUTH EVERY DAY  30 tablet 3  . aspirin EC 81 MG tablet Take 81 mg by mouth daily.    . carvedilol (COREG) 12.5 MG tablet Take 1 tablet (12.5 mg total) by mouth 2 (two) times daily with a meal. 60 tablet 2  . citalopram (CELEXA) 20 MG tablet TAKE 1 TABLET BY MOUTH EVERY DAY 30 tablet 3  . ibuprofen (ADVIL,MOTRIN) 200 MG tablet Take 200 mg by mouth as needed.    Marland Kitchen. lisinopril (PRINIVIL,ZESTRIL) 40 MG tablet TAKE 1 TABLET BY MOUTH EVERY DAY 30 tablet 3  . nitroGLYCERIN (NITROSTAT) 0.4 MG SL tablet Place 0.4 mg under the tongue every 5 (five) minutes as needed for chest pain.     . predniSONE (DELTASONE) 10 MG tablet Take 6 tabs day 1, 5 tabs day 2, 4 tabs day 3, 3 tabs day 4, 2 tabs day 5, 1 tab day 6 21 tablet 0  . rosuvastatin (CRESTOR) 10 MG tablet Take 1 tablet (10 mg total) by mouth daily. 30 tablet 11  . rosuvastatin (CRESTOR) 5 MG tablet TAKE 1 TABLET BY MOUTH EVERY DAY 30 tablet 3   No current facility-administered medications for this visit.     Allergies  Allergen Reactions  . Atorvastatin Other (See Comments)    Muscle cramps    Family History  Problem Relation Age of  Onset  . Heart disease Mother   . Hyperlipidemia Mother   . Hypertension Mother   . Heart attack Mother 67       CABG X 4; past at age 91  . Heart disease Sister 81       s/p stents   . Stroke Father   . Cancer Neg Hx   . Diabetes Neg Hx     Social History   Social History  . Marital status: Widowed    Spouse name: N/A  . Number of children: N/A  . Years of education: N/A   Occupational History  . Chief Financial Officer   Social History Main Topics  . Smoking status: Never Smoker  . Smokeless tobacco: Never Used  . Alcohol use 1.2 oz/week    2 Cans of beer per week     Comment: biweekly 1-2 beers  . Drug use: No  . Sexual activity: Not Currently   Other Topics Concern  . Not on file   Social History Narrative   Lives locally with his son, who has a h/o liver failure s/p prior liver transplant.   He is pending repeat transplant.     Constitutional: Denies fever, malaise, headache or abrupt weight changes.  Musculoskeletal: Pt reports bilateral hand pain, twitching of left elbow. Denies decrease in range of motion, difficulty with gait, muscle pain or joint swelling.  Skin: Denies redness, rashes, lesions or ulcercations.  Neurological: Pt reports abnormal sensation of right upper back. Denies dizziness, difficulty with memory, difficulty with speech or problems with balance and coordination.    No other specific complaints in a complete review of systems (except as listed in HPI above).   Objective:   Physical Exam  BP (!) 140/94   Pulse 70   Temp 98 F (36.7 C) (Oral)   Wt (!) 336 lb (152.4 kg)   SpO2 96%   BMI 48.21 kg/m  Wt Readings from Last 3 Encounters:  03/28/17 (!) 336 lb (152.4 kg)  11/21/16 (!) 327 lb 8 oz (148.6 kg)  08/08/16 (!) 345 lb (156.5 kg)    General: Appears his stated age, obese in NAD. Skin: Warm, dry and intact. No rashes noted. Musculoskeletal: Normal flexion, extension and rotation of the left elbow. No pain with palpation of the medial or lateral epicondyle. Normal flexion, extension, abduction and adduction of the fingers. Hand grips equal.  Neurological: Alert and oriented. Negative Phalen. Negative Tinel's.    BMET    Component Value Date/Time   NA 139 08/08/2016 1540   NA 138 02/02/2014 0718   K 4.3 08/08/2016 1540   K 3.9 02/02/2014 0718   CL 103 08/08/2016 1540   CL 105 02/02/2014 0718   CO2 29 08/08/2016 1540   CO2 25 02/02/2014 0718   GLUCOSE 74 08/08/2016 1540   GLUCOSE 114 (H) 02/02/2014 0718   BUN 11 08/08/2016 1540   BUN 16 02/02/2014 0718   CREATININE 0.82 08/08/2016 1540   CREATININE 0.83 02/02/2014 0718   CALCIUM 9.1 08/08/2016 1540   CALCIUM 8.5 02/02/2014 0718   GFRNONAA >60 02/02/2014 0718   GFRAA >60 02/02/2014 0718    Lipid Panel     Component Value Date/Time   CHOL 142 08/08/2016 1540   CHOL 149  01/15/2014 0826   CHOL 167 03/11/2012 0645   TRIG 230.0 (H) 08/08/2016 1540   TRIG 98 03/11/2012 0645   HDL 37.50 (L) 08/08/2016 1540   HDL 41 01/15/2014 0826   HDL 41 03/11/2012  0645   CHOLHDL 4 08/08/2016 1540   VLDL 46.0 (H) 08/08/2016 1540   VLDL 20 03/11/2012 0645   LDLCALC 83 01/15/2014 0826   LDLCALC 106 (H) 03/11/2012 0645    CBC    Component Value Date/Time   WBC 7.3 08/08/2016 1540   RBC 5.52 08/08/2016 1540   HGB 14.2 08/08/2016 1540   HGB 13.4 02/02/2014 0718   HCT 42.9 08/08/2016 1540   HCT 41.1 02/02/2014 0718   PLT 320.0 08/08/2016 1540   PLT 233 02/02/2014 0718   MCV 77.7 (L) 08/08/2016 1540   MCV 80 02/02/2014 0718   MCH 26.0 02/02/2014 0718   MCH 25.2 (L) 12/06/2012 1248   MCHC 33.1 08/08/2016 1540   RDW 15.5 08/08/2016 1540   RDW 15.7 (H) 02/02/2014 0718   LYMPHSABS 1.4 12/06/2012 1248   LYMPHSABS 0.9 (L) 03/10/2012 1240   MONOABS 0.4 12/06/2012 1248   MONOABS 0.4 03/10/2012 1240   EOSABS 0.2 12/06/2012 1248   EOSABS 0.0 03/10/2012 1240   BASOSABS 0.0 12/06/2012 1248   BASOSABS 0.0 03/10/2012 1240    Hgb A1C Lab Results  Component Value Date   HGBA1C 6.4 08/08/2016        Assessment & Plan:   Abnormal Sensation of Back:  ? Nerve impingement He declines xray of thoracic spine Discussed stretching exercises, massage may be helpful Will trial Neurontin 100 mg TID prn  Left Elbow Problem:  Exam benign Will monitor for now  Bilateral Hand Pain:  Could be post traumatic arthritis or nerve damage from prior surgery Continue Ibuprofen for now Will trial Neurontin 100 mg TID prn  Return precautions discussed [name], NP

## 2017-03-29 NOTE — Patient Instructions (Signed)
Paresthesia Paresthesia is a burning or prickling feeling. This feeling can happen in any part of the body. It often happens in the hands, arms, legs, or feet. Usually, it is not painful. In most cases, the feeling goes away in a short time and is not a sign of a serious problem. Follow these instructions at home:  Avoid drinking alcohol.  Try massage or needle therapy (acupuncture) to help with your problems.  Keep all follow-up visits as told by your doctor. This is important. Contact a doctor if:  You keep on having episodes of paresthesia.  Your burning or prickling feeling gets worse when you walk.  You have pain or cramps.  You feel dizzy.  You have a rash. Get help right away if:  You feel weak.  You have trouble walking or moving.  You have problems speaking, understanding, or seeing.  You feel confused.  You cannot control when you pee (urinate) or poop (bowel movement).  You lose feeling (numbness) after an injury.  You pass out (faint). This information is not intended to replace advice given to you by your health care provider. Make sure you discuss any questions you have with your health care provider. Document Released: 06/22/2008 Document Revised: 12/16/2015 Document Reviewed: 07/06/2014 Elsevier Interactive Patient Education  2018 Elsevier Inc.  

## 2017-04-02 ENCOUNTER — Encounter: Payer: Self-pay | Admitting: Internal Medicine

## 2017-04-03 ENCOUNTER — Other Ambulatory Visit: Payer: Self-pay | Admitting: Internal Medicine

## 2017-04-03 MED ORDER — PREDNISONE 10 MG PO TABS: ORAL_TABLET | ORAL | 0 refills | Status: DC

## 2017-05-15 ENCOUNTER — Other Ambulatory Visit: Payer: Self-pay | Admitting: Internal Medicine

## 2017-05-15 DIAGNOSIS — F411 Generalized anxiety disorder: Secondary | ICD-10-CM

## 2017-05-16 MED ORDER — ALPRAZOLAM 0.5 MG PO TABS
ORAL_TABLET | ORAL | 0 refills | Status: DC
Start: 2017-05-16 — End: 2017-08-15

## 2017-05-16 NOTE — Telephone Encounter (Signed)
Last filled 03/13/17... Please advise 

## 2017-05-16 NOTE — Telephone Encounter (Signed)
Ok to phone in Xanax 

## 2017-05-16 NOTE — Telephone Encounter (Signed)
Rx called in to pharmacy. 

## 2017-07-10 ENCOUNTER — Other Ambulatory Visit: Payer: Self-pay | Admitting: Internal Medicine

## 2017-08-15 ENCOUNTER — Other Ambulatory Visit: Payer: Self-pay | Admitting: Internal Medicine

## 2017-08-15 DIAGNOSIS — F411 Generalized anxiety disorder: Secondary | ICD-10-CM

## 2017-08-15 MED ORDER — ALPRAZOLAM 0.5 MG PO TABS: ORAL_TABLET | ORAL | 0 refills | Status: DC

## 2017-08-15 NOTE — Telephone Encounter (Signed)
St filled 05/16/17... Please advise

## 2017-09-03 ENCOUNTER — Encounter: Payer: Self-pay | Admitting: Internal Medicine

## 2017-09-03 DIAGNOSIS — Z0289 Encounter for other administrative examinations: Secondary | ICD-10-CM

## 2017-09-03 NOTE — Progress Notes (Unsigned)
   Subjective:    Patient ID: Steve Burnett, male    DOB: Nov 29, 1960, 57 y.o.   MRN: [id number]  HPI  Pt presents to the clinic today for his annual exam. He is also due to follow up chronic conditions.  Anxiety:  HLD with CAD:  HTN:  OSA:  Flu:04/2016 Tetanus: 08/2014 PSA Screening: 07/2016 Colon Screening: Vision Screening: Dentist:  Diet: Exercise:  Review of Systems     Objective:   Physical Exam        Assessment & Plan:

## 2017-09-10 ENCOUNTER — Other Ambulatory Visit: Payer: Self-pay | Admitting: Cardiovascular Disease

## 2017-09-10 ENCOUNTER — Other Ambulatory Visit: Payer: Self-pay | Admitting: Internal Medicine

## 2017-10-14 ENCOUNTER — Other Ambulatory Visit: Payer: Self-pay | Admitting: Internal Medicine

## 2017-11-02 ENCOUNTER — Other Ambulatory Visit: Payer: Self-pay | Admitting: Internal Medicine

## 2017-11-02 DIAGNOSIS — F411 Generalized anxiety disorder: Secondary | ICD-10-CM

## 2017-11-29 ENCOUNTER — Other Ambulatory Visit: Payer: Self-pay | Admitting: Internal Medicine

## 2017-12-06 ENCOUNTER — Other Ambulatory Visit: Payer: Self-pay | Admitting: Internal Medicine

## 2017-12-06 DIAGNOSIS — F411 Generalized anxiety disorder: Secondary | ICD-10-CM

## 2017-12-06 NOTE — Telephone Encounter (Signed)
Overdue CPE reminder letter mailed to pt

## 2018-01-23 ENCOUNTER — Other Ambulatory Visit: Payer: Self-pay

## 2018-01-23 ENCOUNTER — Encounter: Payer: Self-pay | Admitting: Emergency Medicine

## 2018-01-23 ENCOUNTER — Inpatient Hospital Stay
Admission: EM | Admit: 2018-01-23 | Discharge: 2018-01-25 | DRG: 246 | Disposition: A | Discharge status: Home / Self Care | Payer: BLUE CROSS/BLUE SHIELD | Attending: Cardiovascular Disease | Admitting: Cardiovascular Disease

## 2018-01-23 ENCOUNTER — Encounter
Admission: EM | Disposition: A | Discharge status: Home / Self Care | Payer: Self-pay | Source: Home / Self Care | Attending: Cardiology

## 2018-01-23 ENCOUNTER — Emergency Department: Payer: BLUE CROSS/BLUE SHIELD

## 2018-01-23 ENCOUNTER — Inpatient Hospital Stay (HOSPITAL_COMMUNITY)
Admit: 2018-01-23 | Discharge: 2018-01-23 | Disposition: A | Discharge status: Home / Self Care | Payer: BLUE CROSS/BLUE SHIELD | Attending: Physician Assistant | Admitting: Physician Assistant

## 2018-01-23 DIAGNOSIS — I25110 Atherosclerotic heart disease of native coronary artery with unstable angina pectoris: Secondary | ICD-10-CM | POA: Diagnosis not present

## 2018-01-23 DIAGNOSIS — I255 Ischemic cardiomyopathy: Secondary | ICD-10-CM

## 2018-01-23 DIAGNOSIS — I5021 Acute systolic (congestive) heart failure: Secondary | ICD-10-CM | POA: Diagnosis not present

## 2018-01-23 DIAGNOSIS — G4733 Obstructive sleep apnea (adult) (pediatric): Secondary | ICD-10-CM | POA: Diagnosis not present

## 2018-01-23 DIAGNOSIS — I10 Essential (primary) hypertension: Secondary | ICD-10-CM

## 2018-01-23 DIAGNOSIS — I2129 ST elevation (STEMI) myocardial infarction involving other sites: Secondary | ICD-10-CM

## 2018-01-23 DIAGNOSIS — I2119 ST elevation (STEMI) myocardial infarction involving other coronary artery of inferior wall: Secondary | ICD-10-CM | POA: Diagnosis not present

## 2018-01-23 DIAGNOSIS — Z6841 Body Mass Index (BMI) 40.0 and over, adult: Secondary | ICD-10-CM

## 2018-01-23 DIAGNOSIS — I4901 Ventricular fibrillation: Secondary | ICD-10-CM | POA: Diagnosis not present

## 2018-01-23 DIAGNOSIS — I110 Hypertensive heart disease with heart failure: Secondary | ICD-10-CM | POA: Diagnosis not present

## 2018-01-23 DIAGNOSIS — Z7982 Long term (current) use of aspirin: Secondary | ICD-10-CM | POA: Diagnosis not present

## 2018-01-23 DIAGNOSIS — T82855A Stenosis of coronary artery stent, initial encounter: Secondary | ICD-10-CM | POA: Diagnosis not present

## 2018-01-23 DIAGNOSIS — Z91199 Patient's noncompliance with other medical treatment and regimen due to unspecified reason: Secondary | ICD-10-CM

## 2018-01-23 DIAGNOSIS — E785 Hyperlipidemia, unspecified: Secondary | ICD-10-CM | POA: Diagnosis not present

## 2018-01-23 DIAGNOSIS — I213 ST elevation (STEMI) myocardial infarction of unspecified site: Secondary | ICD-10-CM

## 2018-01-23 DIAGNOSIS — D72829 Elevated white blood cell count, unspecified: Secondary | ICD-10-CM | POA: Diagnosis not present

## 2018-01-23 DIAGNOSIS — I480 Paroxysmal atrial fibrillation: Secondary | ICD-10-CM | POA: Diagnosis present

## 2018-01-23 DIAGNOSIS — Z9114 Patient's other noncompliance with medication regimen: Secondary | ICD-10-CM | POA: Diagnosis not present

## 2018-01-23 DIAGNOSIS — I5030 Unspecified diastolic (congestive) heart failure: Secondary | ICD-10-CM | POA: Diagnosis not present

## 2018-01-23 DIAGNOSIS — Z9119 Patient's noncompliance with other medical treatment and regimen: Secondary | ICD-10-CM | POA: Diagnosis not present

## 2018-01-23 DIAGNOSIS — Y831 Surgical operation with implant of artificial internal device as the cause of abnormal reaction of the patient, or of later complication, without mention of misadventure at the time of the procedure: Secondary | ICD-10-CM | POA: Diagnosis present

## 2018-01-23 DIAGNOSIS — F419 Anxiety disorder, unspecified: Secondary | ICD-10-CM | POA: Diagnosis present

## 2018-01-23 DIAGNOSIS — E876 Hypokalemia: Secondary | ICD-10-CM

## 2018-01-23 DIAGNOSIS — I2510 Atherosclerotic heart disease of native coronary artery without angina pectoris: Secondary | ICD-10-CM | POA: Diagnosis not present

## 2018-01-23 DIAGNOSIS — R739 Hyperglycemia, unspecified: Secondary | ICD-10-CM | POA: Diagnosis present

## 2018-01-23 DIAGNOSIS — E782 Mixed hyperlipidemia: Secondary | ICD-10-CM | POA: Diagnosis not present

## 2018-01-23 DIAGNOSIS — Z9889 Other specified postprocedural states: Secondary | ICD-10-CM | POA: Diagnosis not present

## 2018-01-23 DIAGNOSIS — Z955 Presence of coronary angioplasty implant and graft: Secondary | ICD-10-CM | POA: Diagnosis not present

## 2018-01-23 DIAGNOSIS — I462 Cardiac arrest due to underlying cardiac condition: Secondary | ICD-10-CM | POA: Diagnosis not present

## 2018-01-23 DIAGNOSIS — R079 Chest pain, unspecified: Secondary | ICD-10-CM | POA: Diagnosis not present

## 2018-01-23 HISTORY — PX: CORONARY/GRAFT ACUTE MI REVASCULARIZATION: CATH118305

## 2018-01-23 HISTORY — PX: LEFT HEART CATH AND CORONARY ANGIOGRAPHY: CATH118249

## 2018-01-23 LAB — COMPREHENSIVE METABOLIC PANEL
ALBUMIN: 4.1 g/dL (ref 3.5–5.0)
ALT: 33 U/L (ref 0–44)
AST: 36 U/L (ref 15–41)
Alkaline Phosphatase: 66 U/L (ref 38–126)
Anion gap: 12 (ref 5–15)
BUN: 9 mg/dL (ref 6–20)
CHLORIDE: 107 mmol/L (ref 98–111)
CO2: 22 mmol/L (ref 22–32)
Calcium: 9.4 mg/dL (ref 8.9–10.3)
Creatinine, Ser: 0.69 mg/dL (ref 0.61–1.24)
GFR calc Af Amer: >60 mL/min (ref >60)
GFR calc non Af Amer: >60 mL/min (ref >60)
GLUCOSE: 147 mg/dL — AB (ref 70–99)
POTASSIUM: 3.4 mmol/L — AB (ref 3.5–5.1)
SODIUM: 141 mmol/L (ref 135–145)
TOTAL PROTEIN: 7.9 g/dL (ref 6.5–8.1)
Total Bilirubin: 0.7 mg/dL (ref 0.3–1.2)

## 2018-01-23 LAB — CBC WITH DIFFERENTIAL/PLATELET
BASOS ABS: 0.1 10*3/uL (ref 0–0.1)
BASOS PCT: 1 %
EOS ABS: 0.2 10*3/uL (ref 0–0.7)
Eosinophils Relative: 2 %
HEMATOCRIT: 44.9 % (ref 40.0–52.0)
HEMOGLOBIN: 15 g/dL (ref 13.0–18.0)
Lymphocytes Relative: 26 %
Lymphs Abs: 2 10*3/uL (ref 1.0–3.6)
MCH: 26.3 pg (ref 26.0–34.0)
MCHC: 33.4 g/dL (ref 32.0–36.0)
MCV: 78.9 fL — ABNORMAL LOW (ref 80.0–100.0)
MONOS PCT: 7 %
Monocytes Absolute: 0.5 10*3/uL (ref 0.2–1.0)
NEUTROS ABS: 5.1 10*3/uL (ref 1.4–6.5)
NEUTROS PCT: 64 %
Platelets: 381 10*3/uL (ref 150–440)
RBC: 5.69 MIL/uL (ref 4.40–5.90)
RDW: 15.4 % — ABNORMAL HIGH (ref 11.5–14.5)
WBC: 7.8 10*3/uL (ref 3.8–10.6)

## 2018-01-23 LAB — TROPONIN I
TROPONIN I: 2.68 ng/mL — AB (ref <0.03)
TROPONIN I: 7.12 ng/mL — AB (ref <0.03)
Troponin I: 0.05 ng/mL (ref <0.03)

## 2018-01-23 LAB — MAGNESIUM: Magnesium: 2.2 mg/dL (ref 1.7–2.4)

## 2018-01-23 LAB — TSH: TSH: 0.964 u[IU]/mL (ref 0.350–4.500)

## 2018-01-23 LAB — HEMOGLOBIN A1C
HEMOGLOBIN A1C: 6 % — AB (ref 4.8–5.6)
Mean Plasma Glucose: 125.5 mg/dL

## 2018-01-23 LAB — LIPID PANEL
Cholesterol: 204 mg/dL — ABNORMAL HIGH (ref 0–200)
HDL: 43 mg/dL (ref >40)
LDL CALC: 132 mg/dL — AB (ref 0–99)
Total CHOL/HDL Ratio: 4.7 RATIO
Triglycerides: 144 mg/dL (ref <150)
VLDL: 29 mg/dL (ref 0–40)

## 2018-01-23 LAB — PROTIME-INR
INR: 0.89
PROTHROMBIN TIME: 12 s (ref 11.4–15.2)

## 2018-01-23 LAB — APTT: APTT: 27 s (ref 24–36)

## 2018-01-23 LAB — MRSA PCR SCREENING: MRSA BY PCR: NEGATIVE

## 2018-01-23 LAB — GLUCOSE, CAPILLARY: Glucose-Capillary: 171 mg/dL — ABNORMAL HIGH (ref 70–99)

## 2018-01-23 LAB — POCT ACTIVATED CLOTTING TIME: Activated Clotting Time: 406 seconds

## 2018-01-23 SURGERY — CORONARY/GRAFT ACUTE MI REVASCULARIZATION
Anesthesia: Moderate Sedation

## 2018-01-23 MED ORDER — MORPHINE SULFATE (PF) 2 MG/ML IV SOLN
2.0000 mg | Freq: Once | INTRAVENOUS | Status: AC
  Administered 2018-01-23: 2 mg via INTRAVENOUS

## 2018-01-23 MED ORDER — AMIODARONE HCL IN DEXTROSE 360-4.14 MG/200ML-% IV SOLN
60.0000 mg/h | INTRAVENOUS | Status: AC
  Administered 2018-01-23: 60 mg/h via INTRAVENOUS
  Filled 2018-01-23: qty 200

## 2018-01-23 MED ORDER — HEPARIN SODIUM (PORCINE) 5000 UNIT/ML IJ SOLN
60.0000 [IU]/kg | Freq: Once | INTRAMUSCULAR | Status: AC
  Administered 2018-01-23: 4000 [IU] via INTRAVENOUS
  Filled 2018-01-23: qty 10

## 2018-01-23 MED ORDER — HEPARIN (PORCINE) IN NACL 1000-0.9 UT/500ML-% IV SOLN
INTRAVENOUS | Status: AC
  Filled 2018-01-23: qty 1000

## 2018-01-23 MED ORDER — ASPIRIN 81 MG PO CHEW
81.0000 mg | CHEWABLE_TABLET | Freq: Every day | ORAL | Status: DC
  Administered 2018-01-24 – 2018-01-25 (×2): 81 mg via ORAL
  Filled 2018-01-23 (×2): qty 1

## 2018-01-23 MED ORDER — ASPIRIN 81 MG PO CHEW
324.0000 mg | CHEWABLE_TABLET | Freq: Once | ORAL | Status: AC
  Administered 2018-01-23: 324 mg via ORAL

## 2018-01-23 MED ORDER — ALPRAZOLAM 0.5 MG PO TABS
0.5000 mg | ORAL_TABLET | Freq: Once | ORAL | Status: AC
  Administered 2018-01-23: 0.5 mg via ORAL
  Filled 2018-01-23: qty 1

## 2018-01-23 MED ORDER — AMIODARONE HCL 150 MG/3ML IV SOLN
INTRAVENOUS | Status: DC | PRN
  Administered 2018-01-23: 300 mg via INTRAVENOUS

## 2018-01-23 MED ORDER — MORPHINE SULFATE (PF) 2 MG/ML IV SOLN
2.0000 mg | INTRAVENOUS | Status: AC | PRN
  Administered 2018-01-23 (×4): 2 mg via INTRAVENOUS
  Filled 2018-01-23 (×4): qty 1

## 2018-01-23 MED ORDER — ORAL CARE MOUTH RINSE: 15.0000 mL | Freq: Two times a day (BID) | OROMUCOSAL | Status: DC

## 2018-01-23 MED ORDER — AMIODARONE HCL 150 MG/3ML IV SOLN
INTRAVENOUS | Status: AC
  Filled 2018-01-23: qty 6

## 2018-01-23 MED ORDER — MORPHINE SULFATE (PF) 2 MG/ML IV SOLN
INTRAVENOUS | Status: AC
  Administered 2018-01-23: 2 mg via INTRAVENOUS
  Filled 2018-01-23: qty 1

## 2018-01-23 MED ORDER — OXYCODONE-ACETAMINOPHEN 5-325 MG PO TABS
1.0000 | ORAL_TABLET | ORAL | Status: DC | PRN
  Administered 2018-01-23: 1 via ORAL
  Administered 2018-01-24: 2 via ORAL
  Administered 2018-01-24: 1 via ORAL
  Administered 2018-01-24: 2 via ORAL
  Filled 2018-01-23: qty 2
  Filled 2018-01-23: qty 1
  Filled 2018-01-23: qty 2
  Filled 2018-01-23: qty 1

## 2018-01-23 MED ORDER — MIDAZOLAM HCL 2 MG/2ML IJ SOLN
INTRAMUSCULAR | Status: DC | PRN
  Administered 2018-01-23: 1 mg via INTRAVENOUS

## 2018-01-23 MED ORDER — TICAGRELOR 90 MG PO TABS
ORAL_TABLET | ORAL | Status: DC | PRN
  Administered 2018-01-23: 180 mg via ORAL

## 2018-01-23 MED ORDER — BIVALIRUDIN TRIFLUOROACETATE 250 MG IV SOLR
INTRAVENOUS | Status: AC
  Filled 2018-01-23: qty 250

## 2018-01-23 MED ORDER — SODIUM CHLORIDE 0.9% FLUSH: 3.0000 mL | INTRAVENOUS | Status: DC | PRN

## 2018-01-23 MED ORDER — TICAGRELOR 90 MG PO TABS
90.0000 mg | ORAL_TABLET | Freq: Two times a day (BID) | ORAL | Status: DC
  Administered 2018-01-23 – 2018-01-25 (×4): 90 mg via ORAL
  Filled 2018-01-23 (×5): qty 1

## 2018-01-23 MED ORDER — HYDRALAZINE HCL 20 MG/ML IJ SOLN
10.0000 mg | INTRAMUSCULAR | Status: DC | PRN
  Administered 2018-01-23: 10 mg via INTRAVENOUS
  Filled 2018-01-23: qty 1

## 2018-01-23 MED ORDER — SODIUM CHLORIDE 0.9 % WEIGHT BASED INFUSION
1.0000 mL/kg/h | INTRAVENOUS | Status: AC
  Administered 2018-01-23 (×2): 1 mL/kg/h via INTRAVENOUS

## 2018-01-23 MED ORDER — SODIUM CHLORIDE 0.9 % IV SOLN
INTRAVENOUS | Status: AC | PRN
  Administered 2018-01-23 (×2): 1.75 mg/kg/h via INTRAVENOUS

## 2018-01-23 MED ORDER — ONDANSETRON HCL 4 MG/2ML IJ SOLN
INTRAMUSCULAR | Status: AC
  Filled 2018-01-23: qty 2

## 2018-01-23 MED ORDER — LABETALOL HCL 5 MG/ML IV SOLN
10.0000 mg | INTRAVENOUS | Status: AC | PRN
  Administered 2018-01-23 (×2): 10 mg via INTRAVENOUS
  Filled 2018-01-23 (×2): qty 4

## 2018-01-23 MED ORDER — SODIUM CHLORIDE 0.9% FLUSH
3.0000 mL | Freq: Two times a day (BID) | INTRAVENOUS | Status: DC
  Administered 2018-01-23 – 2018-01-25 (×4): 3 mL via INTRAVENOUS

## 2018-01-23 MED ORDER — POTASSIUM CHLORIDE CRYS ER 20 MEQ PO TBCR
30.0000 meq | EXTENDED_RELEASE_TABLET | Freq: Two times a day (BID) | ORAL | Status: AC
  Administered 2018-01-23 (×2): 30 meq via ORAL
  Filled 2018-01-23 (×2): qty 2

## 2018-01-23 MED ORDER — MIDAZOLAM HCL 2 MG/2ML IJ SOLN
INTRAMUSCULAR | Status: AC
  Filled 2018-01-23: qty 2

## 2018-01-23 MED ORDER — HYDRALAZINE HCL 20 MG/ML IJ SOLN: 5.0000 mg | INTRAMUSCULAR | Status: AC | PRN

## 2018-01-23 MED ORDER — SODIUM CHLORIDE 0.9 % IV SOLN: 250.0000 mL | INTRAVENOUS | Status: DC | PRN

## 2018-01-23 MED ORDER — LIDOCAINE HCL (PF) 1 % IJ SOLN
INTRAMUSCULAR | Status: AC
  Filled 2018-01-23: qty 30

## 2018-01-23 MED ORDER — ONDANSETRON HCL 4 MG/2ML IJ SOLN
INTRAMUSCULAR | Status: DC | PRN
  Administered 2018-01-23: 4 mg via INTRAVENOUS

## 2018-01-23 MED ORDER — PROMETHAZINE HCL 25 MG/ML IJ SOLN
25.0000 mg | Freq: Four times a day (QID) | INTRAMUSCULAR | Status: DC | PRN
  Administered 2018-01-23: 25 mg via INTRAVENOUS
  Filled 2018-01-23: qty 1

## 2018-01-23 MED ORDER — FENTANYL CITRATE (PF) 100 MCG/2ML IJ SOLN
INTRAMUSCULAR | Status: AC
  Filled 2018-01-23: qty 2

## 2018-01-23 MED ORDER — ATROPINE SULFATE 1 MG/10ML IJ SOSY
PREFILLED_SYRINGE | INTRAMUSCULAR | Status: AC
Start: 2018-01-23 — End: ?
  Filled 2018-01-23: qty 10

## 2018-01-23 MED ORDER — ONDANSETRON HCL 4 MG/2ML IJ SOLN
4.0000 mg | Freq: Four times a day (QID) | INTRAMUSCULAR | Status: DC | PRN
Start: 2018-01-23 — End: 2018-01-25
  Administered 2018-01-23: 4 mg via INTRAVENOUS
  Filled 2018-01-23: qty 2

## 2018-01-23 MED ORDER — IOPAMIDOL (ISOVUE-300) INJECTION 61%
INTRAVENOUS | Status: DC | PRN
  Administered 2018-01-23: 205 mL via INTRA_ARTERIAL

## 2018-01-23 MED ORDER — TICAGRELOR 90 MG PO TABS
ORAL_TABLET | ORAL | Status: AC
  Filled 2018-01-23: qty 2

## 2018-01-23 MED ORDER — CARVEDILOL 12.5 MG PO TABS
12.5000 mg | ORAL_TABLET | Freq: Two times a day (BID) | ORAL | Status: DC
  Administered 2018-01-23 – 2018-01-25 (×5): 12.5 mg via ORAL
  Filled 2018-01-23 (×5): qty 1

## 2018-01-23 MED ORDER — MAGNESIUM SULFATE 50 % IJ SOLN
INTRAMUSCULAR | Status: AC
  Filled 2018-01-23: qty 2

## 2018-01-23 MED ORDER — AMLODIPINE BESYLATE 10 MG PO TABS
10.0000 mg | ORAL_TABLET | Freq: Every day | ORAL | Status: DC
  Administered 2018-01-23 – 2018-01-24 (×2): 10 mg via ORAL
  Filled 2018-01-23 (×2): qty 1

## 2018-01-23 MED ORDER — SODIUM CHLORIDE 0.9 % IV SOLN
INTRAVENOUS | Status: DC
  Administered 2018-01-23: 09:00:00 via INTRAVENOUS

## 2018-01-23 MED ORDER — ACETAMINOPHEN 325 MG PO TABS: 650.0000 mg | ORAL_TABLET | ORAL | Status: DC | PRN

## 2018-01-23 MED ORDER — AMIODARONE HCL IN DEXTROSE 360-4.14 MG/200ML-% IV SOLN
30.0000 mg/h | INTRAVENOUS | Status: DC
  Administered 2018-01-23 – 2018-01-24 (×2): 30 mg/h via INTRAVENOUS
  Filled 2018-01-23 (×4): qty 200

## 2018-01-23 MED ORDER — MAGNESIUM SULFATE 50 % IJ SOLN
INTRAMUSCULAR | Status: DC | PRN
  Administered 2018-01-23: 1 g via INTRAVENOUS

## 2018-01-23 MED ORDER — BIVALIRUDIN BOLUS VIA INFUSION - CUPID
INTRAVENOUS | Status: DC | PRN
  Administered 2018-01-23: 105.45 mg via INTRAVENOUS

## 2018-01-23 SURGICAL SUPPLY — 19 items
BALLN TREK RX 2.5X15 (BALLOONS) ×3
BALLN ~~LOC~~ TREK RX 4.0X15 (BALLOONS) ×3
BALLOON TREK RX 2.5X15 (BALLOONS) ×1 IMPLANT
BALLOON ~~LOC~~ TREK RX 4.0X15 (BALLOONS) ×1 IMPLANT
CATH INFINITI 5FR ANG PIGTAIL (CATHETERS) ×3 IMPLANT
CATH INFINITI 5FR JL4 (CATHETERS) ×3 IMPLANT
CATH INFINITI 5FR JL5 (CATHETERS) ×3 IMPLANT
CATH VISTA GUIDE 6FR JL4 (CATHETERS) ×3 IMPLANT
CATH VISTA GUIDE 6FR JL5 (CATHETERS) IMPLANT
CATH VISTA GUIDE 6FR JR4 (CATHETERS) ×3 IMPLANT
DEVICE CLOSURE MYNXGRIP 6/7F (Vascular Products) ×3 IMPLANT
DEVICE INFLAT 30 PLUS (MISCELLANEOUS) ×3 IMPLANT
KIT MANI 3VAL PERCEP (MISCELLANEOUS) ×3 IMPLANT
NEEDLE PERC 18GX7CM (NEEDLE) ×3 IMPLANT
PACK CARDIAC CATH (CUSTOM PROCEDURE TRAY) ×3 IMPLANT
SHEATH AVANTI 6FR X 11CM (SHEATH) ×3 IMPLANT
STENT SIERRA 3.50 X 15 MM (Permanent Stent) ×3 IMPLANT
WIRE ASAHI PROWATER 180CM (WIRE) ×3 IMPLANT
WIRE GUIDERIGHT .035X150 (WIRE) ×3 IMPLANT

## 2018-01-23 NOTE — Consult Note (Signed)
[facility]  CARDIOLOGY CONSULT NOTE  Patient ID: Steve Burnett MRN: [id number] DOB/AGE: 55-Dec-1962 55 y.o.  Admit date: 01/23/2018 Referring Physician Mayford KnifeWilliams Primary Physician  Primary Cardiologist Kirke CorinArida Reason for Consultation inferior STEMI  HPI: 57 year old gentleman referred for inferior STEMI.  The patient has known coronary artery disease, history of non-STEMI and DES proximal LAD 02/2012 followed by Dr. Kirke CorinArida.  The patient was in his usual state of health until early this morning when he developed 10 out of 10 chest pain.  [facility] where he ECG revealed diagnostic ST elevations in leads II, III and aVF as well as lateral leads V5 and V6, consistent with acute inferior ST elevation myocardial infarction.    Review of systems complete and found to be negative unless listed above     Past Medical History:  Diagnosis Date  . Anxiety   . Chicken pox   . Coronary artery disease    a. 02/2012 NSTEMI/Cath/PCI: LAD: 60% proximal, 80% diffuse mid & 70d, nonobs LCX/RCA --> DES to the prox/mid LAD.  Marland Kitchen. Hyperlipidemia   . Hypertension   . Hypokalemia   . MI, acute, non ST segment elevation (HCC)   . Obesity   . OSA (obstructive sleep apnea) 03/27/2014    Past Surgical History:  Procedure Laterality Date  . CARDIAC CATHETERIZATION  03/12/2012  . CORONARY ANGIOPLASTY  03/12/2012   s/p drug eluting stent to the mid LAD : 3.5 x 38 mm Xience drug-eluting stent. Post dilated to 4.0 in mid segment and 4.5 proximally.    Medications Prior to Admission  Medication Sig Dispense Refill Last Dose  . citalopram (CELEXA) 10 MG tablet Take 1 tablet (10 mg total) by mouth daily. MUST SCHEDULE ANNUAL PHYSICAL 30 tablet 0   . citalopram (CELEXA) 20 MG tablet TAKE 1 TABLET (20 MG TOTAL) BY MOUTH DAILY. MUST SCHEDULE ANNUAL EXAM 30 tablet 0   . acetaminophen (TYLENOL) 650 MG CR tablet Take 650 mg by mouth as needed.   PRN at PRN  . ALPRAZolam (XANAX) 0.5 MG tablet TAKE ONE  TABLET BY MOUTH AT BEDTIME AS NEEDED FOR ANXIETY (Patient not taking: Reported on 01/23/2018) 20 tablet 0 Not Taking at Unknown time  . amLODipine (NORVASC) 10 MG tablet TAKE 1 TABLET BY MOUTH EVERY DAY (Patient not taking: Reported on 01/23/2018) 30 tablet 1 Not Taking at Unknown time  . carvedilol (COREG) 12.5 MG tablet Take 1 tablet (12.5 mg total) by mouth 2 (two) times daily with a meal. (Patient not taking: Reported on 01/23/2018) 60 tablet 2 Not Taking at Unknown time  . gabapentin (NEURONTIN) 100 MG capsule Take 1 capsule (100 mg total) by mouth 3 (three) times daily. (Patient not taking: Reported on 01/23/2018) 30 capsule 0 Not Taking at Unknown time  . ibuprofen (ADVIL,MOTRIN) 200 MG tablet Take 200 mg by mouth as needed.   PRN at PRN  . lisinopril (PRINIVIL,ZESTRIL) 40 MG tablet TAKE 1 TABLET BY MOUTH EVERY DAY (Patient not taking: Reported on 01/23/2018) 30 tablet 1 Not Taking at Unknown time  . predniSONE (DELTASONE) 10 MG tablet Take 6 tabs day 1, 5 tabs day 2, 4 tabs day 3, 3 tabs day 4, 2 tabs day 5, 1 tab day 6 (Patient not taking: Reported on 01/23/2018) 21 tablet 0 Not Taking at Unknown time  . rosuvastatin (CRESTOR) 5 MG tablet TAKE 1 TABLET BY MOUTH EVERY DAY (Patient not taking: Reported on 03/28/2017) 30 tablet 3 Not Taking at Unknown time  Social History   Socioeconomic History  . Marital status: Widowed    Spouse name: Not on file  . Number of children: Not on file  . Years of education: Not on file  . Highest education level: Not on file  Occupational History  . Occupation: Tourist information centre manager: [employer]  Social Needs  . Financial resource strain: Not on file  . Food insecurity:    Worry: Not on file    Inability: Not on file  . Transportation needs:    Medical: Not on file    Non-medical: Not on file  Tobacco Use  . Smoking status: Never Smoker  . Smokeless tobacco: Never Used  Substance and Sexual Activity  . Alcohol use: Yes    Alcohol/week: 1.2 oz     Types: 2 Cans of beer per week    Comment: biweekly 1-2 beers  . Drug use: No  . Sexual activity: Not Currently  Lifestyle  . Physical activity:    Days per week: Not on file    Minutes per session: Not on file  . Stress: Not on file  Relationships  . Social connections:    Talks on phone: Not on file    Gets together: Not on file    Attends religious service: Not on file    Active member of club or organization: Not on file    Attends meetings of clubs or organizations: Not on file    Relationship status: Not on file  . Intimate partner violence:    Fear of current or ex partner: Not on file    Emotionally abused: Not on file    Physically abused: Not on file    Forced sexual activity: Not on file  Other Topics Concern  . Not on file  Social History Narrative   Lives locally with his son, who has a h/o liver failure s/p prior liver transplant.  He is pending repeat transplant.    Family History  Problem Relation Age of Onset  . Heart disease Mother   . Hyperlipidemia Mother   . Hypertension Mother   . Heart attack Mother 92       CABG X 4; past at age 51  . Heart disease Sister 41       s/p stents   . Stroke Father   . Cancer Neg Hx   . Diabetes Neg Hx       Review of systems complete and found to be negative unless listed above      PHYSICAL EXAM  General: Well developed, well nourished, in no acute distress HEENT:  Normocephalic and atramatic Neck:  No JVD.  Lungs: Clear bilaterally to auscultation and percussion. Heart: HRRR . Normal S1 and S2 without gallops or murmurs.  Abdomen: Bowel sounds are positive, abdomen soft and non-tender  Msk:  Back normal, normal gait. Normal strength and tone for age. Extremities: No clubbing, cyanosis or edema.   Neuro: Alert and oriented X 3. Psych:  Good affect, responds appropriately  Labs:   Lab Results  Component Value Date   WBC 7.8 01/23/2018   HGB 15.0 01/23/2018   HCT 44.9 01/23/2018   MCV 78.9 (L)  01/23/2018   PLT 381 01/23/2018    Recent Labs  Lab 01/23/18 0847  NA 141  K 3.4*  CL 107  CO2 22  BUN 9  CREATININE 0.69  CALCIUM 9.4  PROT 7.9  BILITOT 0.7  ALKPHOS 66  ALT 33  AST 36  GLUCOSE 147*   Lab Results  Component Value Date   CKTOTAL 94 05/01/2012   CKMB 1.3 05/01/2012   TROPONINI 0.05 (HH) 01/23/2018    Lab Results  Component Value Date   CHOL 204 (H) 01/23/2018   CHOL 142 08/08/2016   CHOL 149 01/15/2014   Lab Results  Component Value Date   HDL 43 01/23/2018   HDL 37.50 (L) 08/08/2016   HDL 41 01/15/2014   Lab Results  Component Value Date   LDLCALC 132 (H) 01/23/2018   LDLCALC 83 01/15/2014   LDLCALC 106 (H) 03/11/2012   Lab Results  Component Value Date   TRIG 144 01/23/2018   TRIG 230.0 (H) 08/08/2016   TRIG 123 01/15/2014   Lab Results  Component Value Date   CHOLHDL 4.7 01/23/2018   CHOLHDL 4 08/08/2016   CHOLHDL 3.6 01/15/2014   Lab Results  Component Value Date   LDLDIRECT 79.0 08/08/2016      Radiology: Dg Chest Port 1 View  Result Date: 01/23/2018 CLINICAL DATA:  Onset of pressure-like chest pain 30 minutes prior to presentation. Associated diaphoresis, dizziness, and shortness of breath. History of previous MI. EXAM: PORTABLE CHEST 1 VIEW COMPARISON:  Chest x-ray of February 02, 2014 FINDINGS: The lungs are well-expanded and clear. The cardiac silhouette is enlarged. The pulmonary vascularity is normal. The mediastinum is normal in width. There is no pleural effusion or pneumothorax. External pacemaker defibrillator pads are present. IMPRESSION: There is no acute pulmonary edema. Mild stable enlargement of the cardiac silhouette. Electronically Signed   By: David  [place] M.D.   On: 01/23/2018 09:08    EKG: Normal sinus rhythm with diagnostic ST elevation in leads II, III and aVF  ASSESSMENT AND PLAN:   1.  Acute inferior STEMI  Recommendations  1.  Proceed with emergency cardiac catheterization and probable primary  percutaneous coronary intervention  Signed: Marcina Millard MD,PhD, [facility] 01/23/2018, 10:09 AM

## 2018-01-23 NOTE — Care Management (Signed)
Brilinta Voucher left at patient bedside. RN was providing patient care at time. RNCM to follow for transition of care needs.

## 2018-01-23 NOTE — ED Notes (Signed)
1 sublingual nitro administered at 08:51

## 2018-01-23 NOTE — ED Notes (Signed)
Pt's son Steve Burnett ([phone]) called per pt's request and information given regarding pt status. Pt's son states he will be on his way to ED shortly.

## 2018-01-23 NOTE — Progress Notes (Signed)
RN called and spoke with Dr. Lonn Georgiaonforti and made MD aware that patient continues to have nausea unrelieved by zofran.  MD gave order for PRN phenergan.

## 2018-01-23 NOTE — Progress Notes (Signed)
RN spoke with Dr. Mariah MillingGollan and made MD aware that patient is complaining of chest pain 3/10 and blood pressure is elevated with SBP 170-180's and DBP 1 teens.  MD stated he would order home meds and order some morphine.

## 2018-01-23 NOTE — ED Notes (Signed)
First Nurse Note:  Patient complaining of chest pain, hx of MI, states pain is similar to that time.  Diaphoretic, grunting respirations.  Taken directly to Room 11, Charge Heather called.

## 2018-01-23 NOTE — ED Provider Notes (Signed)
[facility] Emergency Department Provider Note       Time seen: ----------------------------------------- 8:45 AM on 01/23/2018 -----------------------------------------   I have reviewed the triage vital signs and the nursing notes.  HISTORY   Chief Complaint Chest Pain and Shortness of Breath    HPI Steve Burnett is a 57 y.o. male with a history of anxiety, coronary artery disease, hyper lipidemia, hypertension, hypokalemia, obstructive sleep apnea who presents to the ED for severe central chest pain with diaphoresis.  He does have a history of MI, states pain is similar to that time.  He arrives diaphoretic with difficulty breathing and dizziness.  Chest pain started about 30 minutes ago.  Nothing makes his pain better at this time.  Past Medical History:  Diagnosis Date  . Anxiety   . Chicken pox   . Coronary artery disease    a. 02/2012 NSTEMI/Cath/PCI: LAD: 60% proximal, 80% diffuse mid & 70d, nonobs LCX/RCA --> DES to the prox/mid LAD.  Marland Kitchen Hyperlipidemia   . Hypertension   . Hypokalemia   . Obesity   . OSA (obstructive sleep apnea) 03/27/2014    Patient Active Problem List   Diagnosis Date Noted  . OSA (obstructive sleep apnea) 03/27/2014  . Severe obesity (BMI >= 40) (HCC) 03/03/2014  . Anxiety 07/31/2012  . Hyperlipidemia   . Hypertension   . Coronary artery disease 03/12/2012    Past Surgical History:  Procedure Laterality Date  . CARDIAC CATHETERIZATION  03/12/2012  . CORONARY ANGIOPLASTY  03/12/2012   s/p drug eluting stent to the mid LAD : 3.5 x 38 mm Xience drug-eluting stent. Post dilated to 4.0 in mid segment and 4.5 proximally.    Allergies Atorvastatin  Social History Social History   Tobacco Use  . Smoking status: Never Smoker  . Smokeless tobacco: Never Used  Substance Use Topics  . Alcohol use: Yes    Alcohol/week: 1.2 oz    Types: 2 Cans of beer per week    Comment: biweekly 1-2 beers  . Drug use: No   Review  of Systems Constitutional: Negative for fever. Cardiovascular: Positive for chest pain Respiratory: Positive for shortness of breath Gastrointestinal: Negative for abdominal pain, positive for nausea Musculoskeletal: Negative for back pain. Skin: As of her diaphoresis Neurological: Negative for headaches  All systems negative/normal/unremarkable except as stated in the HPI  ____________________________________________   PHYSICAL EXAM:  VITAL SIGNS: ED Triage Vitals [01/23/18 0845]  Enc Vitals Group     BP (!) 152/107     Pulse Rate 73     Resp (!) 23     Temp      Temp src      SpO2 98 %     Weight      Height      Head Circumference      Peak Flow      Pain Score 10     Pain Loc      Pain Edu?      Excl. in GC?    Constitutional: Alert and oriented.  Moderate distress, anxious Eyes: Conjunctivae are normal. Normal extraocular movements. ENT   Head: Normocephalic and atraumatic.   Nose: No congestion/rhinnorhea.   Mouth/Throat: Mucous membranes are moist.   Neck: No stridor. Cardiovascular: Normal rate, regular rhythm. No murmurs, rubs, or gallops. Respiratory: Normal respiratory effort without tachypnea nor retractions. Breath sounds are clear and equal bilaterally. No wheezes/rales/rhonchi. Gastrointestinal: Soft and nontender. Normal bowel sounds Musculoskeletal: Nontender with normal range of motion  in extremities. No lower extremity tenderness nor edema. Neurologic:  Normal speech and language. No gross focal neurologic deficits are appreciated.  Skin:  Skin is warm, profuse diaphoresis is noted Psychiatric: Mood and affect are normal. Speech and behavior are normal.  ____________________________________________  EKG: Interpreted by me.  Sinus rhythm rate 82 bpm, acute inferior infarct with ST elevation is noted, there are some anterior ST elevations as well.  Normal QT  ____________________________________________  ED COURSE:  As part of my  medical decision making, I reviewed the following data within the electronic MEDICAL RECORD NUMBER History obtained from family if available, nursing notes, old chart and ekg, as well as notes from prior ED visits. Patient presented for chest pain, we will assess with labs and imaging as indicated at this time.   Procedures ____________________________________________   LABS (pertinent positives/negatives)  Labs Reviewed  CBC WITH DIFFERENTIAL/PLATELET - Abnormal; Notable for the following components:      Result Value   MCV 78.9 (*)    RDW 15.4 (*)    All other components within normal limits  PROTIME-INR  APTT  COMPREHENSIVE METABOLIC PANEL  TROPONIN I  LIPID PANEL    RADIOLOGY  Chest x-ray Is unremarkable for any acute process  ____________________________________________  DIFFERENTIAL DIAGNOSIS   ST elevation MI, dissection, PE, unstable angina  FINAL ASSESSMENT AND PLAN  ST elevation MI   Plan: The patient had presented for chest pain with indication of STEMI.  STEMI page and code STEMI was activated.  STEMI labs were obtained, patient's imaging is still pending.  Patient was having severe chest pain and received a dose of nitroglycerin as well as morphine for pain, heparin and aspirin.  He has been taken emergently to the Cath Lab.   [name], MD   Note: This note was generated in part or whole with voice recognition software. Voice recognition is usually quite accurate but there are transcription errors that can and very often do occur. I apologize for any typographical errors that were not detected and corrected.     [name], [name], MD 01/23/18 [id number]

## 2018-01-23 NOTE — ED Triage Notes (Signed)
Pt reports CP that started approx 30 minutes ago. Pt extremely diaphoretic. Pt with hx of MI. Pt reports pain is pressure like in the middle of his chest. Pt also reports SOB and dizziness

## 2018-01-23 NOTE — Progress Notes (Signed)
  Amiodarone Drug - Drug Interaction Consult Note  Recommendations: K replaced, will f/u recheck.   Amiodarone is metabolized by the cytochrome P450 system and therefore has the potential to cause many drug interactions. Amiodarone has an average plasma half-life of 50 days (range 20 to 100 days).   There is potential for drug interactions to occur several weeks or months after stopping treatment and the onset of drug interactions may be slow after initiating amiodarone.   [x]  Statins: Increased risk of myopathy. Simvastatin- restrict dose to 20mg  daily. Other statins: counsel patients to report any muscle pain or weakness immediately.  []  Anticoagulants: Amiodarone can increase anticoagulant effect. Consider warfarin dose reduction. Patients should be monitored closely and the dose of anticoagulant altered accordingly, remembering that amiodarone levels take several weeks to stabilize.  []  Antiepileptics: Amiodarone can increase plasma concentration of phenytoin, the dose should be reduced. Note that small changes in phenytoin dose can result in large changes in levels. Monitor patient and counsel on signs of toxicity.  [x]  Beta blockers: increased risk of bradycardia, AV block and myocardial depression. Sotalol - avoid concomitant use.  [x]   Calcium channel blockers (diltiazem and verapamil): increased risk of bradycardia, AV block and myocardial depression.  []   Cyclosporine: Amiodarone increases levels of cyclosporine. Reduced dose of cyclosporine is recommended.  []  Digoxin dose should be halved when amiodarone is started.  []  Diuretics: increased risk of cardiotoxicity if hypokalemia occurs.  []  Oral hypoglycemic agents (glyburide, glipizide, glimepiride): increased risk of hypoglycemia. Patient's glucose levels should be monitored closely when initiating amiodarone therapy.   []  Drugs that prolong the QT interval:  Torsades de pointes risk may be increased with concurrent use -  avoid if possible.  Monitor QTc, also keep magnesium/potassium WNL if concurrent therapy can't be avoided. Marland Kitchen. Antibiotics: e.g. fluoroquinolones, erythromycin. . Antiarrhythmics: e.g. quinidine, procainamide, disopyramide, sotalol. . Antipsychotics: e.g. phenothiazines, haloperidol.  . Lithium, tricyclic antidepressants, and methadone. Thank You,  Steve Burnett, Steve Burnett  01/23/2018 11:37 AM

## 2018-01-23 NOTE — Consult Note (Signed)
Reason for Consult:To assist with critical care management Referring Physician: Dr. Earmon Phoenix Antonellis is an 57 y.o. male.  HPI: Mr. Treptow is a 57 year old gentleman with a past medical history remarkable for hypertension, hyperlipidemia, obstructive sleep apnea, morbid obesity, coronary artery disease with history of drug-eluting stent to the LAD and 02/2012, developed acute onset of chest pain, 10 out of 10, presented to the emergency department and was found to have acute ST segment elevations in 23 aVF V5 and V6 and was subsequently emergently taken to the Cath Lab for code STEMI. He arrives in intensive care unit, status post PCI treated with DES to the proximal RCA. Presently has an episode of atrial fibrillation per report had episode of V. Tach requiring synchronous cardioversion  Past Medical History:  Diagnosis Date  . Anxiety   . Chicken pox   . Coronary artery disease    a. 02/2012 NSTEMI/Cath/PCI: LAD: 60% proximal, 80% diffuse mid & 70d, nonobs LCX/RCA --> DES to the prox/mid LAD.  Marland Kitchen Hyperlipidemia   . Hypertension   . Hypokalemia   . MI, acute, non ST segment elevation ([place])   . Obesity   . OSA (obstructive sleep apnea) 03/27/2014    Past Surgical History:  Procedure Laterality Date  . CARDIAC CATHETERIZATION  03/12/2012  . CORONARY ANGIOPLASTY  03/12/2012   s/p drug eluting stent to the mid LAD : 3.5 x 38 mm Xience drug-eluting stent. Post dilated to 4.0 in mid segment and 4.5 proximally.    Family History  Problem Relation Age of Onset  . Heart disease Mother   . Hyperlipidemia Mother   . Hypertension Mother   . Heart attack Mother 55       CABG X 4; past at age 75  . Heart disease Sister 12       s/p stents   . Stroke Father   . Cancer Neg Hx   . Diabetes Neg Hx     Social History:  reports that he has never smoked. He has never used smokeless tobacco. He reports that he drinks about 1.2 oz of alcohol per week. He reports that he does not use  drugs.  Allergies:  Allergies  Allergen Reactions  . Atorvastatin Other (See Comments)    Muscle cramps    Medications: I have reviewed the patient's current medications.  Results for orders placed or performed during the hospital encounter of 01/23/18 (from the past 48 hour(s))  CBC with Differential/Platelet     Status: Abnormal   Collection Time: 01/23/18  8:47 AM  Result Value Ref Range   WBC 7.8 3.8 - 10.6 K/uL   RBC 5.69 4.40 - 5.90 MIL/uL   Hemoglobin 15.0 13.0 - 18.0 g/dL   HCT 44.9 40.0 - 52.0 %   MCV 78.9 (L) 80.0 - 100.0 fL   MCH 26.3 26.0 - 34.0 pg   MCHC 33.4 32.0 - 36.0 g/dL   RDW 15.4 (H) 11.5 - 14.5 %   Platelets 381 150 - 440 K/uL   Neutrophils Relative % 64 %   Neutro Abs 5.1 1.4 - 6.5 K/uL   Lymphocytes Relative 26 %   Lymphs Abs 2.0 1.0 - 3.6 K/uL   Monocytes Relative 7 %   Monocytes Absolute 0.5 0.2 - 1.0 K/uL   Eosinophils Relative 2 %   Eosinophils Absolute 0.2 0 - 0.7 K/uL   Basophils Relative 1 %   Basophils Absolute 0.1 0 - 0.1 K/uL    Comment: Performed at [facility], [address] [place], [place] [address]  Protime-INR     Status: None   Collection Time: 01/23/18  8:47 AM  Result Value Ref Range   Prothrombin Time 12.0 11.4 - 15.2 seconds   INR 0.89     Comment: Performed at [facility], [address] [place], [place] [address]  APTT     Status: None   Collection Time: 01/23/18  8:47 AM  Result Value Ref Range   aPTT 27 24 - 36 seconds    Comment: Performed at [facility], [address] [place], [place] [address]  Comprehensive metabolic panel     Status: Abnormal   Collection Time: 01/23/18  8:47 AM  Result Value Ref Range   Sodium 141 135 - 145 mmol/L   Potassium 3.4 (L) 3.5 - 5.1 mmol/L   Chloride 107 98 - 111 mmol/L    Comment: Please note change in reference range.   CO2 22 22 - 32 mmol/L   Glucose, Bld 147 (H) 70 - 99 mg/dL    Comment: Please note change in reference range.   BUN 9 6 -  20 mg/dL    Comment: Please note change in reference range.   Creatinine, Ser 0.69 0.61 - 1.24 mg/dL   Calcium 9.4 8.9 - 10.3 mg/dL   Total Protein 7.9 6.5 - 8.1 g/dL   Albumin 4.1 3.5 - 5.0 g/dL   AST 36 15 - 41 U/L   ALT 33 0 - 44 U/L    Comment: Please note change in reference range.   Alkaline Phosphatase 66 38 - 126 U/L   Total Bilirubin 0.7 0.3 - 1.2 mg/dL   GFR calc non Af Amer >60 >60 mL/min   GFR calc Af Amer >60 >60 mL/min    Comment: (NOTE) The eGFR has been calculated using the CKD EPI equation. This calculation has not been validated in all clinical situations. eGFR's persistently <60 mL/min signify possible Chronic Kidney Disease.    Anion gap 12 5 - 15    Comment: Performed at [facility], [place], [place] [address]  Troponin I     Status: Abnormal   Collection Time: 01/23/18  8:47 AM  Result Value Ref Range   Troponin I 0.05 (HH) <0.03 ng/mL    Comment: CRITICAL RESULT CALLED TO, READ BACK BY AND VERIFIED WITH JACKIE VICKERS @0923  01/23/18 MGP Performed at [facility], [address] [place], [place] [address]   Lipid panel     Status: Abnormal   Collection Time: 01/23/18  8:47 AM  Result Value Ref Range   Cholesterol 204 (H) 0 - 200 mg/dL   Triglycerides 144 <150 mg/dL   HDL 43 >40 mg/dL   Total CHOL/HDL Ratio 4.7 RATIO   VLDL 29 0 - 40 mg/dL   LDL Cholesterol 132 (H) 0 - 99 mg/dL    Comment:        Total Cholesterol/HDL:CHD Risk Coronary Heart Disease Risk Table                     Men   Women  1/2 Average Risk   3.4   3.3  Average Risk       5.0   4.4  2 X Average Risk   9.6   7.1  3 X Average Risk  23.4   11.0        Use the calculated Patient Ratio above and the CHD Risk  Table to determine the patient's CHD Risk.        ATP III CLASSIFICATION (LDL):  <100     mg/dL   Optimal  100-129  mg/dL   Near or Above                    Optimal  130-159  mg/dL   Borderline  160-189  mg/dL   High  >190     mg/dL    Very High Performed at [facility], [address] [place], [place] [address]   POCT Activated clotting time     Status: None   Collection Time: 01/23/18  9:37 AM  Result Value Ref Range   Activated Clotting Time 406 seconds  Glucose, capillary     Status: Abnormal   Collection Time: 01/23/18 10:38 AM  Result Value Ref Range   Glucose-Capillary 171 (H) 70 - 99 mg/dL    Dg Chest Port 1 View  Result Date: 01/23/2018 CLINICAL DATA:  Onset of pressure-like chest pain 30 minutes prior to presentation. Associated diaphoresis, dizziness, and shortness of breath. History of previous MI. EXAM: PORTABLE CHEST 1 VIEW COMPARISON:  Chest x-ray of February 02, 2014 FINDINGS: The lungs are well-expanded and clear. The cardiac silhouette is enlarged. The pulmonary vascularity is normal. The mediastinum is normal in width. There is no pleural effusion or pneumothorax. External pacemaker defibrillator pads are present. IMPRESSION: There is no acute pulmonary edema. Mild stable enlargement of the cardiac silhouette. Electronically Signed   By: David  Martinique M.D.   On: 01/23/2018 09:08    ROS Blood pressure (!) 152/107, pulse 73, resp. rate (!) 23, height 5' 10"  (1.778 m), weight (!) 309 lb 15.5 oz (140.6 kg), SpO2 98 %. Physical Exam Patient is awake, alert, anxious, has some residual chest pain noted Vital signs: Please see the above listed vital signs Patient is in atrial fibrillation with controlled ventricular response on the monitor Is hypertensive at 152/107 HEENT: Trachea is midline, no thyromegaly noted, no carotid bruits appreciated Cardiovascular: Irregularly irregular rhythm Pulmonary: Clear to auscultation Abdominal: Positive bowel sounds, soft exam Extremities: No clubbing cyanosis or edema noted Neurologic: No focal deficits appreciated  Assessment/Plan: STEMI, status post RCA PCI/DES. Being managed by cardiology, presently on dual antiplatelet agents, Coreg, Norvasc. Pain control  with morphine and as needed labetalol.Has been started on amiodarone for atrial fibrillation and episode of VT. We'll follow along and assist with critical care management.  Echo Allsbrook 01/23/2018, 11:11 AM

## 2018-01-23 NOTE — Progress Notes (Signed)
*  PRELIMINARY RESULTS* Echocardiogram 2D Echocardiogram has been performed.  Cristela BlueHege, Fleur Audino 01/23/2018, 3:20 PM

## 2018-01-24 DIAGNOSIS — Z955 Presence of coronary angioplasty implant and graft: Secondary | ICD-10-CM

## 2018-01-24 DIAGNOSIS — I2119 ST elevation (STEMI) myocardial infarction involving other coronary artery of inferior wall: Principal | ICD-10-CM

## 2018-01-24 DIAGNOSIS — I25110 Atherosclerotic heart disease of native coronary artery with unstable angina pectoris: Secondary | ICD-10-CM

## 2018-01-24 DIAGNOSIS — Z9114 Patient's other noncompliance with medication regimen: Secondary | ICD-10-CM

## 2018-01-24 DIAGNOSIS — E782 Mixed hyperlipidemia: Secondary | ICD-10-CM

## 2018-01-24 LAB — CBC
HEMATOCRIT: 42.8 % (ref 40.0–52.0)
HEMOGLOBIN: 14.1 g/dL (ref 13.0–18.0)
MCH: 26.2 pg (ref 26.0–34.0)
MCHC: 33 g/dL (ref 32.0–36.0)
MCV: 79.4 fL — ABNORMAL LOW (ref 80.0–100.0)
Platelets: 275 10*3/uL (ref 150–440)
RBC: 5.39 MIL/uL (ref 4.40–5.90)
RDW: 15.8 % — ABNORMAL HIGH (ref 11.5–14.5)
WBC: 12.3 10*3/uL — AB (ref 3.8–10.6)

## 2018-01-24 LAB — ECHOCARDIOGRAM COMPLETE
HEIGHTINCHES: 70 in
Weight: 4959.47 oz

## 2018-01-24 LAB — BASIC METABOLIC PANEL
Anion gap: 7 (ref 5–15)
BUN: 11 mg/dL (ref 6–20)
CHLORIDE: 106 mmol/L (ref 98–111)
CO2: 25 mmol/L (ref 22–32)
Calcium: 8.6 mg/dL — ABNORMAL LOW (ref 8.9–10.3)
Creatinine, Ser: 0.39 mg/dL — ABNORMAL LOW (ref 0.61–1.24)
GFR calc non Af Amer: >60 mL/min (ref >60)
Glucose, Bld: 122 mg/dL — ABNORMAL HIGH (ref 70–99)
POTASSIUM: 3.9 mmol/L (ref 3.5–5.1)
SODIUM: 138 mmol/L (ref 135–145)

## 2018-01-24 LAB — TROPONIN I: Troponin I: 18.45 ng/mL (ref <0.03)

## 2018-01-24 MED ORDER — ROSUVASTATIN CALCIUM 10 MG PO TABS
10.0000 mg | ORAL_TABLET | Freq: Every day | ORAL | Status: DC
  Administered 2018-01-24: 10 mg via ORAL
  Filled 2018-01-24: qty 1

## 2018-01-24 MED ORDER — ALPRAZOLAM 0.5 MG PO TABS
0.2500 mg | ORAL_TABLET | Freq: Four times a day (QID) | ORAL | Status: DC | PRN
  Administered 2018-01-24: 0.25 mg via ORAL
  Filled 2018-01-24: qty 1

## 2018-01-24 MED ORDER — ALPRAZOLAM 0.5 MG PO TABS: 0.5000 mg | ORAL_TABLET | Freq: Four times a day (QID) | ORAL | Status: DC | PRN

## 2018-01-24 MED ORDER — LISINOPRIL 20 MG PO TABS
20.0000 mg | ORAL_TABLET | Freq: Every day | ORAL | Status: DC
  Administered 2018-01-24 – 2018-01-25 (×2): 20 mg via ORAL
  Filled 2018-01-24 (×2): qty 1

## 2018-01-24 MED ORDER — ALPRAZOLAM 0.5 MG PO TABS
0.2500 mg | ORAL_TABLET | Freq: Once | ORAL | Status: AC
  Administered 2018-01-24: 0.25 mg via ORAL
  Filled 2018-01-24: qty 1

## 2018-01-24 NOTE — Progress Notes (Signed)
Follow up - Critical Care Medicine Note  Patient Details:    Steve Burnett is an 57 y.o. male.with a past medical history remarkable for hypertension, hyperlipidemia, obstructive sleep apnea, morbid obesity, coronary artery disease with history of drug-eluting stent to the LAD and 02/2012, developed acute onset of chest pain, 10 out of 10, presented to the emergency department and was found to have acute ST segment elevations in 23 aVF V5 and V6 and was subsequently emergently taken to the Cath Lab for code STEMI. He arrives in intensive care unit, status post PCI treated with DES to the proximal RCA.    Lines, Airways, Drains:    Anti-infectives:  Anti-infectives (From admission, onward)   None      Microbiology: Results for orders placed or performed during the hospital encounter of 01/23/18  MRSA PCR Screening     Status: None   Collection Time: 01/23/18 10:49 AM  Result Value Ref Range Status   MRSA by PCR NEGATIVE NEGATIVE Final    Comment:        The GeneXpert MRSA Assay (FDA approved for NASAL specimens only), is one component of a comprehensive MRSA colonization surveillance program. It is not intended to diagnose MRSA infection nor to guide or monitor treatment for MRSA infections. Performed at [facility], [address] [place], [place] [address]    Events:   Studies: [facility]  Result Date: 01/23/2018 CLINICAL DATA:  Onset of pressure-like chest pain 30 minutes prior to presentation. Associated diaphoresis, dizziness, and shortness of breath. History of previous MI. EXAM: PORTABLE CHEST 1 VIEW COMPARISON:  Chest x-ray of February 02, 2014 FINDINGS: The lungs are well-expanded and clear. The cardiac silhouette is enlarged. The pulmonary vascularity is normal. The mediastinum is normal in width. There is no pleural effusion or pneumothorax. External pacemaker defibrillator pads are present. IMPRESSION: There is no acute pulmonary edema. Mild stable  enlargement of the cardiac silhouette. Electronically Signed   By: David  SwazilandJordan M.D.   On: 01/23/2018 09:08    Consults: Treatment Team:  Antonieta IbaGollan, Timothy J, MD Tora Kindredonforti, Breawna Montenegro, DO   Subjective:    Overnight Issues: overnight he had some nausea, he converted back from atrial fibrillation to sinus rhythm presently on amiodarone infusion  Objective:  Vital signs for last 24 hours: Temp:  [97.5 F (36.4 C)-97.8 F (36.6 C)] 97.8 F (36.6 C) (07/04 0400) Pulse Rate:  [59-105] 63 (07/04 0600) Resp:  [11-24] 19 (07/04 0600) BP: (110-185)/(76-129) 121/97 (07/04 0600) SpO2:  [94 %-100 %] 98 % (07/04 0600) Weight:  [309 lb 15.5 oz (140.6 kg)-310 lb (140.6 kg)] 309 lb 15.5 oz (140.6 kg) (07/03 0853)  Hemodynamic parameters for last 24 hours:    Intake/Output from previous day: 07/03 0701 - 07/04 0700 In: 2042.5 [P.O.:240; I.V.:1802.5] Out: 3850 [Urine:3850]  Intake/Output this shift: No intake/output data recorded.  Vent settings for last 24 hours:    Physical Exam:   Patient is awake, alert, in no acute distress Vital signs:       Please see the above listed vital signs. Has converted back to sinus rhythm on the monitor HEENT:           Trachea is midline, no thyromegaly noted, no carotid bruits appreciated Cardiovascular:           Regular rate and rhythm Pulmonary:      Clear to auscultation Abdominal:      Positive bowel sounds, soft exam Extremities:  No clubbing cyanosis or edema noted Neurologic:      No focal deficits appreciated    Assessment/Plan:   STEMI, status post RCA PCI/DES. Being managed by cardiology, presently on dual antiplatelet agents, Coreg, Norvasc. Pain control with morphine and as needed labetalol.Has been started on amiodarone for atrial fibrillation and episode of VT. Converted back to normal sinus rhythm with stable hemodynamics. We'll follow along and assist with critical care management. Will most likely be able to be transferred to cards  telemetry today   Steve Burnett 01/24/2018  *Care during the described time interval was provided by me and/or other providers on the critical care team.  I have reviewed this patient's available data, including medical history, events of note, physical examination and test results as part of my evaluation.

## 2018-01-24 NOTE — Progress Notes (Signed)
Progress Note  Patient Name: Steve Burnett Date of Encounter: 01/24/2018  Primary Cardiologist: Kirke CorinArida  Subjective   Denies any significant chest pain Atrial fibrillation following STEMI Converted to normal sinus rhythm at noon on amiodarone Long discussion concerning his habits, poor diet no exercise stopped his medications 3 months ago for no particular reason  Inpatient Medications    Scheduled Meds: . amLODipine  10 mg Oral Daily  . aspirin  81 mg Oral Daily  . carvedilol  12.5 mg Oral BID WC  . mouth rinse  15 mL Mouth Rinse BID  . sodium chloride flush  3 mL Intravenous Q12H  . ticagrelor  90 mg Oral BID   Continuous Infusions: . sodium chloride    . amiodarone Stopped (01/24/18 1159)   PRN Meds: sodium chloride, acetaminophen, hydrALAZINE, ondansetron (ZOFRAN) IV, oxyCODONE-acetaminophen, promethazine, sodium chloride flush   Vital Signs    Vitals:   01/24/18 0900 01/24/18 1000 01/24/18 1100 01/24/18 1300  BP: 135/88 122/84 122/82 (!) 134/95  Pulse: 63 (!) 58 (!) 58 69  Resp: 17 15 17 13   Temp:      TempSrc:      SpO2: 97% 96% 96% 96%  Weight:      Height:        Intake/Output Summary (Last 24 hours) at 01/24/2018 1350 Last data filed at 01/24/2018 1325 Gross per 24 hour  Intake 2350.76 ml  Output 2400 ml  Net -49.24 ml   Filed Weights   01/23/18 0845 01/23/18 0853  Weight: (!) 310 lb (140.6 kg) (!) 309 lb 15.5 oz (140.6 kg)    Telemetry    Normal sinus rhythm - Personally Reviewed  ECG      Physical Exam   GEN: No acute distress.  Obese Neck: No JVD Cardiac: RRR, no murmurs, rubs, or gallops.  Respiratory: Clear to auscultation bilaterally. GI: Soft, nontender, non-distended  MS: No edema; No deformity. Neuro:  Nonfocal  Psych: Normal affect   Labs    Chemistry Recent Labs  Lab 01/23/18 0847 01/24/18 0814  NA 141 138  K 3.4* 3.9  CL 107 106  CO2 22 25  GLUCOSE 147* 122*  BUN 9 11  CREATININE 0.69 0.39*  CALCIUM 9.4 8.6*    PROT 7.9  --   ALBUMIN 4.1  --   AST 36  --   ALT 33  --   ALKPHOS 66  --   BILITOT 0.7  --   GFRNONAA >60 >60  GFRAA >60 >60  ANIONGAP 12 7     Hematology Recent Labs  Lab 01/23/18 0847 01/24/18 0814  WBC 7.8 12.3*  RBC 5.69 5.39  HGB 15.0 14.1  HCT 44.9 42.8  MCV 78.9* 79.4*  MCH 26.3 26.2  MCHC 33.4 33.0  RDW 15.4* 15.8*  PLT 381 275    Cardiac Enzymes Recent Labs  Lab 01/23/18 0847 01/23/18 1223 01/23/18 1655 01/23/18 2310  TROPONINI 0.05* 2.68* 7.12* 18.45*   No results for input(s): TROPIPOC in the last 168 hours.   BNPNo results for input(s): BNP, PROBNP in the last 168 hours.   DDimer No results for input(s): DDIMER in the last 168 hours.   Radiology    Dg Chest Port 1 View  Result Date: 01/23/2018 CLINICAL DATA:  Onset of pressure-like chest pain 30 minutes prior to presentation. Associated diaphoresis, dizziness, and shortness of breath. History of previous MI. EXAM: PORTABLE CHEST 1 VIEW COMPARISON:  Chest x-ray of February 02, 2014 FINDINGS: The lungs are  well-expanded and clear. The cardiac silhouette is enlarged. The pulmonary vascularity is normal. The mediastinum is normal in width. There is no pleural effusion or pneumothorax. External pacemaker defibrillator pads are present. IMPRESSION: There is no acute pulmonary edema. Mild stable enlargement of the cardiac silhouette. Electronically Signed   By: David  Swaziland M.D.   On: 01/23/2018 09:08    Cardiac Studies     Patient Profile     57 y.o. male known history of coronary artery disease, non-STEMI and DES proximal LAD 02/2012 followed by Dr. Kirke Corin.  Presenting with STEMI, para catheterization yesterday stent placed to proximal RCA 100% occluded vessel Medication noncompliance  Assessment & Plan    STEMI  RCA PCI/DES.  Cardiac catheterization images reviewed with him in detail Residual proximal edge her in-stent restenosis of LAD stent 7075% based on the report Had VT and atrial fibrillation  after the procedure yesterday Converted to normal sinus rhythm from atrial fibrillation on amiodarone IV at noon -stressed importance of staying on his medications Aspirin 81 mg with brilinta, high-dose statin, beta blocker ACE inhibitor --- Amlodipine held and changed to lisinopril --Restarted on Crestor 10 mg daily Stressed importance of dietary restrictions weight loss   Atrial fibrillation, paroxysmal Following STEMI Will need to follow carefully as outpatient For now we'll avoid NOACs and continue aspirin with brilinta an effort to avoid triple therapy   morbid obesity We have encouraged continued exercise, careful diet management in an effort to lose weight.  Hyperlipidemia Reports that he stopped his medications 3 months ago   Total encounter time more than 35 minutes  Greater than 50% was spent in counseling and coordination of care with the patient    For questions or updates, please contact [facility] Please consult [email or web address] for contact info under Cardiology/STEMI.      Signed, Julien Nordmann, MD  01/24/2018, 1:50 PM

## 2018-01-24 NOTE — Progress Notes (Signed)
Patient transferred to room 249 by wheelchair with this RN and Diannia RuderKara, [place].

## 2018-01-24 NOTE — Progress Notes (Signed)
Pt c/o anxiety and attending is listed as [place], so on-call MD for [facility] called. Per MD Welton FlakesKhan, order given for 0.25mg  Xanax Q6h PRN. Nursing staff will continue to monitor for any changes in patient status. Lamonte RicherKara A Ariyan Brisendine, RN

## 2018-01-24 NOTE — Progress Notes (Signed)
While rounding, Chaplain offered energetic prayer and spiritual support for the patient and guard.

## 2018-01-24 NOTE — Progress Notes (Signed)
This patient is patient of Dr. Pecola LawlessGOLLAN/ARIDA.

## 2018-01-24 NOTE — Progress Notes (Signed)
  Amiodarone Drug - Drug Interaction Consult Note  Recommendations: K 3.9 this AM, magnesium not assessed.   Amiodarone is metabolized by the cytochrome P450 system and therefore has the potential to cause many drug interactions. Amiodarone has an average plasma half-life of 50 days (range 20 to 100 days).   There is potential for drug interactions to occur several weeks or months after stopping treatment and the onset of drug interactions may be slow after initiating amiodarone.   [x]  Statins: Increased risk of myopathy. Simvastatin- restrict dose to 20mg  daily. Other statins: counsel patients to report any muscle pain or weakness immediately.  []  Anticoagulants: Amiodarone can increase anticoagulant effect. Consider warfarin dose reduction. Patients should be monitored closely and the dose of anticoagulant altered accordingly, remembering that amiodarone levels take several weeks to stabilize.  []  Antiepileptics: Amiodarone can increase plasma concentration of phenytoin, the dose should be reduced. Note that small changes in phenytoin dose can result in large changes in levels. Monitor patient and counsel on signs of toxicity.  [x]  Beta blockers: increased risk of bradycardia, AV block and myocardial depression. Sotalol - avoid concomitant use.  []   Calcium channel blockers (diltiazem and verapamil): increased risk of bradycardia, AV block and myocardial depression.  []   Cyclosporine: Amiodarone increases levels of cyclosporine. Reduced dose of cyclosporine is recommended.  []  Digoxin dose should be halved when amiodarone is started.  []  Diuretics: increased risk of cardiotoxicity if hypokalemia occurs.  []  Oral hypoglycemic agents (glyburide, glipizide, glimepiride): increased risk of hypoglycemia. Patient's glucose levels should be monitored closely when initiating amiodarone therapy.   []  Drugs that prolong the QT interval:  Torsades de pointes risk may be increased with concurrent  use - avoid if possible.  Monitor QTc, also keep magnesium/potassium WNL if concurrent therapy can't be avoided. Marland Kitchen. Antibiotics: e.g. fluoroquinolones, erythromycin. . Antiarrhythmics: e.g. quinidine, procainamide, disopyramide, sotalol. . Antipsychotics: e.g. phenothiazines, haloperidol.  . Lithium, tricyclic antidepressants, and methadone. Thank You,  Carola Frostathan A Antavious Spanos  01/24/2018 2:12 PM

## 2018-01-24 NOTE — Progress Notes (Signed)
Report called to Serenity, RN on 2A.  Patient has napped on and off during shift. A&Ox4.  Denies chest pain and no respiratory distress.  Denies nausea and tolerating diet.

## 2018-01-24 NOTE — Progress Notes (Signed)
Pt still c/o anxiety and stated he was taking 0.5mg  Xanax as needed at home. MD Welton FlakesKhan notified and per MD, okay to increase dose to home dose. Nursing staff will continue to monitor for any changes in patient status. Lamonte RicherKara A Danasia Baker, RN

## 2018-01-25 ENCOUNTER — Other Ambulatory Visit: Payer: Self-pay | Admitting: Internal Medicine

## 2018-01-25 ENCOUNTER — Encounter: Payer: Self-pay | Admitting: Internal Medicine

## 2018-01-25 ENCOUNTER — Telehealth: Payer: Self-pay | Admitting: Cardiovascular Disease

## 2018-01-25 DIAGNOSIS — F411 Generalized anxiety disorder: Secondary | ICD-10-CM

## 2018-01-25 DIAGNOSIS — I480 Paroxysmal atrial fibrillation: Secondary | ICD-10-CM

## 2018-01-25 DIAGNOSIS — I4901 Ventricular fibrillation: Secondary | ICD-10-CM | POA: Diagnosis not present

## 2018-01-25 DIAGNOSIS — R739 Hyperglycemia, unspecified: Secondary | ICD-10-CM

## 2018-01-25 DIAGNOSIS — E876 Hypokalemia: Secondary | ICD-10-CM

## 2018-01-25 DIAGNOSIS — I5021 Acute systolic (congestive) heart failure: Secondary | ICD-10-CM

## 2018-01-25 DIAGNOSIS — I255 Ischemic cardiomyopathy: Secondary | ICD-10-CM

## 2018-01-25 DIAGNOSIS — Z9119 Patient's noncompliance with other medical treatment and regimen: Secondary | ICD-10-CM

## 2018-01-25 DIAGNOSIS — Z91199 Patient's noncompliance with other medical treatment and regimen due to unspecified reason: Secondary | ICD-10-CM

## 2018-01-25 MED ORDER — CARVEDILOL 12.5 MG PO TABS: 12.5000 mg | ORAL_TABLET | Freq: Two times a day (BID) | ORAL | 5 refills | Status: DC

## 2018-01-25 MED ORDER — ASPIRIN 81 MG PO CHEW: 81.0000 mg | CHEWABLE_TABLET | Freq: Every day | ORAL | 11 refills | Status: DC

## 2018-01-25 MED ORDER — TICAGRELOR 90 MG PO TABS: 90.0000 mg | ORAL_TABLET | Freq: Two times a day (BID) | ORAL | 0 refills | Status: DC

## 2018-01-25 MED ORDER — SPIRONOLACTONE 25 MG PO TABS
12.5000 mg | ORAL_TABLET | Freq: Every day | ORAL | Status: DC
  Administered 2018-01-25: 12.5 mg via ORAL
  Filled 2018-01-25: qty 0.5
  Filled 2018-01-25: qty 1

## 2018-01-25 MED ORDER — SPIRONOLACTONE 25 MG PO TABS: 12.5000 mg | ORAL_TABLET | Freq: Every day | ORAL | 5 refills | Status: DC

## 2018-01-25 MED ORDER — LISINOPRIL 20 MG PO TABS
20.0000 mg | ORAL_TABLET | Freq: Every day | ORAL | 5 refills | Status: DC
Start: 2018-01-26 — End: 2018-03-01

## 2018-01-25 MED ORDER — TICAGRELOR 90 MG PO TABS: 90.0000 mg | ORAL_TABLET | Freq: Two times a day (BID) | ORAL | 11 refills | Status: DC

## 2018-01-25 MED ORDER — ROSUVASTATIN CALCIUM 10 MG PO TABS: 10.0000 mg | ORAL_TABLET | Freq: Every day | ORAL | 5 refills | Status: DC

## 2018-01-25 MED ORDER — ALPRAZOLAM 0.5 MG PO TABS: ORAL_TABLET | ORAL | 0 refills | Status: DC

## 2018-01-25 NOTE — Telephone Encounter (Signed)
Notify pt.  I sent rx in the meantime.  Needs f/u with PCP.  Thanks.

## 2018-01-25 NOTE — Telephone Encounter (Signed)
Left detailed message on voicemail.  

## 2018-01-25 NOTE — Telephone Encounter (Signed)
Pt calling stating he's trying to get pick up the medication that we prescribed while being in hospital  The Brilinta, he states with the co pay card it is still costing him a few hundred a month  Would like some advise on this   Please call back

## 2018-01-25 NOTE — Telephone Encounter (Signed)
Name of Medication: Alprazolam Name of Pharmacy: [facility] Last Fill or Written Date and Quantity: 08/15/2017 for #20 with no refills Last Office Visit and Type: 03/28/2017 Sensation disturbance of skin-Recent Hospitalization 01/23/2018 Next Office Visit and Type: No future appointment with Steve Hurry. Burnett Last Controlled Substance Agreement Date: 10/01/2014 Last UDS: Never

## 2018-01-25 NOTE — Telephone Encounter (Signed)
Last office visit 03/28/17 See hospital discharge

## 2018-01-25 NOTE — Telephone Encounter (Signed)
TCM....  Patient is being discharged later today  They saw Steve Burnett and Dr Kirke CorinArida in hospital   They are scheduled to see Eula ListenRyan Dunn  on 02/05/18  They were seen for STEMI  They need to be seen within 14 DAYS   Please call

## 2018-01-25 NOTE — Telephone Encounter (Signed)
I called and spoke with the patient.  He states that Brilinta with the co-pay card is costing him over $200/month.  I have advised him I will pull samples of Brilinta for him that he needs to come pick up today. He is aware to try the free 30 card at the pharmacy and to also call [phone] to see if they can send another #30 free for him.  He is aware that I will speak with the Brilinta rep next week to follow up in the issue with [employer] insurance as he is the 2nd patient this week that I have come in contact with that has been told they will have to pay a few hundred dollars for their Brilinta RX.   Brilinta 90 mg [id number] Exp: 2/22  # 3 bottles given

## 2018-01-25 NOTE — Discharge Instructions (Signed)
All of your medications have been printed out. Please take them to the pharmacy for filling. Please review your medication list as there have been changes. It is important to take both aspirin 81 mg daily and Briltina 90 mg twice daily for at least the next 12 months without missing any doses. If you have difficulty filling the Brilinta, you must call our office anytime of day or night for instructions.       Coronary Angioplasty, Care After This sheet gives you information about how to care for yourself after your procedure. Your health care provider may also give you more specific instructions. If you have problems or questions, contact your health care provider. What can I expect after the procedure? After your procedure, it is common to have:  Bruising at the catheter insertion site. This usually fades within 1-2 weeks.  Blood collecting in the tissue (hematoma) that may be painful to the touch. It should become smaller and less tender within 1-2 weeks.  Follow these instructions at home: Medicines  Take over-the-counter and prescription medicines only as told by your health care provider.  Blood thinners may be prescribed after your procedure to improve blood flow. Bathing  You may shower 24-48 hours after the procedure or as told by your health care provider.  Do not take baths, swim, or use a hot tub until your health care provider approves. Insertion site care  Follow instructions from your health care provider about how to take care of your insertion site. Make sure you: ? Wash your hands with soap and water before you change your bandage (dressing). If soap and water are not available, use hand sanitizer. ? Change your dressing as told by your health care provider. ? Gently wash the site with plain soap and water. ? Use a clean towel to pat the area dry. ? Do not rub the site, because this may cause bleeding. ? Do not apply powder or lotion to the site.  Check your  insertion site every day for signs of infection. Check for: ? More redness, swelling, or pain. ? More fluid or blood. ? Warmth. ? Pus or a bad smell. Lifestyle  Make any lifestyle changes as recommended by your health care provider. This may include: ? Not using any products that contain nicotine or tobacco, such as cigarettes and e-cigarettes. If you need help quitting, ask your health care provider. ? Managing your weight. ? Getting regular exercise. ? Managing your blood pressure. ? Limiting your alcohol intake. ? Managing other health problems, such as diabetes.  Eat a heart-healthy diet. This should include plenty of fresh fruits and vegetables. Avoid foods that are: ? High in salt (sodium). ? Canned or highly processed. ? High in saturated fat or sugar. ? Fried. General instructions  Do not lift over 10 lb (4.5 kg) for 5 days after your procedure or as told by your health care provider.  Ask your health care provider when it is okay to: ? Return to work or school. ? Resume usual physical activities or sports. ? Resume sexual activity.  Keep all follow-up visits as told by your health care provider. This is important. Contact a health care provider if:  You have a fever.  You have chills.  You have increased bleeding from the insertion site. Hold pressure on the site. Get help right away if:  You develop chest pain or shortness of breath, feel faint, or pass out.  You have unusual pain at the  insertion site.  You have redness, warmth, or swelling at the insertion site.  You have drainage (other than a small amount of blood on the dressing) from the insertion site.  The insertion site is bleeding, and the bleeding does not stop after 30 minutes of holding steady pressure on the site.  You develop bleeding from any other place, such as from the rectum. There may be bright red blood in your urine or stool, or it may appear as black, tarry stool. This information is  not intended to replace advice given to you by your health care provider. Make sure you discuss any questions you have with your health care provider. Document Released: 01/26/2005 Document Revised: 11/06/2016 Document Reviewed: 02/13/2016 Elsevier Interactive Patient Education  2018 [employer].     Cardiac Rehabilitation What is cardiac rehabilitation? Cardiac rehabilitation is a treatment program that helps improve the health and well-being of people who have heart problems. Cardiac rehabilitation includes exercise training, education, and counseling to help you get stronger and return to an active lifestyle. This program can help you get better faster and reduce any future hospital stays. Why might I need cardiac rehabilitation?  Cardiac rehabilitation programs can help when you have or have had:  A heart attack.  Heart failure.  Peripheral artery disease.  Coronary artery disease.  Angina.  Lung or breathing problems.  Cardiac rehabilitation programs are also used when you have had:  Coronary artery bypass graft surgery.  Heart valve replacement.  Heart stent placement.  Heart transplant.  Aneurysm repair.  What are the benefits of cardiac rehabilitation? Cardiac rehabilitation can help:  Reduce problems like chest pain and trouble breathing.  Change risk factors that contribute to heart disease, such as: ? Smoking. ? High blood pressure. ? High cholesterol. ? Diabetes. ? Being out of shape or not active. ? Weighing more than 30% higher than your ideal weight. ? Diet.  Improve your mental outlook so you feel: ? More hopeful. ? Better about yourself. ? More confident about taking care of yourself.  Get support from health experts as well as other people with similar problems.  Learn how to manage and understand your medicines.  Teach your family about your condition and how to participate in your recovery.  What happens in cardiac  rehabilitation? You will be assessed by a cardiac rehabilitation team. They will check your health history and do a physical exam. You may need blood tests, stress tests, and other evaluations to make sure that you are ready to start cardiac rehabilitation. The cardiac rehabilitation team works with you to make a plan based on your health and goals. Your program will be tailored to fit you and your needs and may change as you progress. You may work with a health care team that includes:  Doctors.  Nurses.  Dietitians.  Psychologists.  Exercise specialists.  Physical and occupational therapists.  What are the phases of cardiac rehabilitation? A cardiac rehabilitation program is often divided into phases. You advance from one phase to the next. Phase One This phase starts while you are still in the hospital. You may start by walking in your room and then in the hall. You may start some simple exercises with a therapist. Phase Two This phase begins when you go home or to another facility. This phase may last 8-12 weeks. You will travel to a cardiac rehabilitation center or another place where rehabilitation is offered. You will slowly increase your activity level while being closely watched  by a nurse or therapist. Exercises may include a combination of strength or resistance training and cardio or aerobic movement on a treadmill or other machines. Your condition will determine how often and how long these sessions last. In phase two, you may learn how to cook healthy meals, control your blood sugar, and manage your medicines. You may need help with scheduling or planning how and when to take your medicines. If you have questions about your medicines, it is very important that you talk to your health care provider. Phase Three This phase continues for the rest of your life. There will be less supervision. You may still participate in cardiac rehabilitation activities or become part of a group  in your community. You may benefit from talking about your experience with other people who are facing similar challenges. Get help right away if:  You have severe chest discomfort, especially if the pain is crushing or pressure-like and spreads to your arms, back, neck, or jaw. Do not wait to see if the pain will go away.  You have weakness or numbness in your face, arms, or legs, especially on one side of the body.  Your speech is slurred.  You are confused.  You have a sudden severe headache or loss of vision.  You have shortness of breath.  You are sweating and have nausea.  You feel dizzy or faint.  You are fatigued. These symptoms may represent a serious problem that is an emergency. Do not wait to see if the symptoms will go away. Get medical help right away. Call your local emergency services (911 in the [place] Do not drive yourself to the hospital. This information is not intended to replace advice given to you by your health care provider. Make sure you discuss any questions you have with your health care provider. Document Released: 04/18/2008 Document Revised: 06/26/2016 Document Reviewed: 05/24/2015 Elsevier Interactive Patient Education  2018 [employer] Inc[employer]    Femoral Site Care Refer to this sheet in the next few weeks. These instructions provide you with information about caring for yourself after your procedure. Your health care provider may also give you more specific instructions. Your treatment has been planned according to current medical practices, but problems sometimes occur. Call your health care provider if you have any problems or questions after your procedure. What can I expect after the procedure? After your procedure, it is typical to have the following:  Bruising at the site that usually fades within 1-2 weeks.  Blood collecting in the tissue (hematoma) that may be painful to the touch. It should usually decrease in size and tenderness within 1-2  weeks.  Follow these instructions at home:  Take medicines only as directed by your health care provider.  You may shower 24-48 hours after the procedure or as directed by your health care provider. Remove the bandage (dressing) and gently wash the site with plain soap and water. Pat the area dry with a clean towel. Do not rub the site, because this may cause bleeding.  Do not take baths, swim, or use a hot tub until your health care provider approves.  Check your insertion site every day for redness, swelling, or drainage.  Do not apply powder or lotion to the site.  Limit use of stairs to twice a day for the first 2-3 days or as directed by your health care provider.  Do not squat for the first 2-3 days or as directed by your health care provider.  Do not  lift over 10 lb (4.5 kg) for 5 days after your procedure or as directed by your health care provider.  Ask your health care provider when it is okay to: ? Return to work or school. ? Resume usual physical activities or sports. ? Resume sexual activity.  Do not drive home if you are discharged the same day as the procedure. Have someone else drive you.  You may drive 24 hours after the procedure unless otherwise instructed by your health care provider.  Do not operate machinery or power tools for 24 hours after the procedure or as directed by your health care provider.  If your procedure was done as an outpatient procedure, which means that you went home the same day as your procedure, a responsible adult should be with you for the first 24 hours after you arrive home.  Keep all follow-up visits as directed by your health care provider. This is important. Contact a health care provider if:  You have a fever.  You have chills.  You have increased bleeding from the site. Hold pressure on the site. Get help right away if:  You have unusual pain at the site.  You have redness, warmth, or swelling at the site.  You have  drainage (other than a small amount of blood on the dressing) from the site.  The site is bleeding, and the bleeding does not stop after 30 minutes of holding steady pressure on the site.  Your leg or foot becomes pale, cool, tingly, or numb. This information is not intended to replace advice given to you by your health care provider. Make sure you discuss any questions you have with your health care provider. Document Released: 03/13/2014 Document Revised: 12/16/2015 Document Reviewed: 01/27/2014 Elsevier Interactive Patient Education  2018 [employer] Inc[employer]   Acute Coronary Syndrome Acute coronary syndrome (ACS) is a serious problem in which there is suddenly not enough blood and oxygen reaching the heart. ACS can result in chest pain or a heart attack. What are the causes? This condition may be caused by:  A buildup of fat and cholesterol inside of the arteries (atherosclerosis). This is the most common cause. The buildup (plaque) can cause the blood vessels in your heart (coronary arteries) to become narrow or blocked. Plaque can also break off to form a clot.  A coronary spasm.  A tearing of the coronary artery (spontaneous coronary artery dissection).  Low blood pressure (hypotension).  An abnormal heart beat (arrhythmia).  Using cocaine or methamphetamine.  What increases the risk? The following factors may make you more likely to develop this condition:  Age.  History of chest pain, heart attack, or stroke.  Being male.  Family history of chest pain, heart disease, or stroke.  Smoking.  Inactivity.  Being overweight.  High cholesterol.  High blood pressure (hypertension).  Diabetes.  Excessive alcohol use.  What are the signs or symptoms? Common symptoms of this condition include:  Chest pain. The pain may last long, or may stop and come back (recur). It may feel like: ? Crushing or squeezing. ? Tightness, pressure, fullness, or heaviness.  Arm, neck,  jaw, or back pain.  Heartburn or indigestion.  Shortness of breath.  Nausea.  Sudden cold sweats.  Lightheadedness.  Dizziness.  Tiredness (fatigue).  Sometimes there are no symptoms. How is this diagnosed? This condition may be diagnosed through:  An electrocardiogram (ECG). This test records the impulses of the heart.  Blood tests.  A CT scan of the chest.  A coronary angiogram. This procedure checks for a blockage in the coronary arteries.  How is this treated? Treatment for this condition may include:  Oxygen.  Medicines, such as: ? Antiplatelet medicines and blood-thinning medicines, such as aspirin. These help prevent blood clots. ? Fibrinolytic therapy. This breaks apart a blood clot. ? Blood pressure medicines. ? Nitroglycerin. ? Pain medicine. ? Cholesterol medicine.  A procedure called coronary angioplasty and stenting. This is done to widen a narrowed artery and keep it open.  Coronary artery bypass surgery. This allows blood to pass the blockage to reach your heart.  Cardiac rehabilitation. This is a program that helps improve your health and well-being. It includes exercise training, education, and counseling to help you recover.  Follow these instructions at home: Eating and drinking  Follow a heart-healthy, low-salt (sodium) diet.  Use healthy cooking methods such as roasting, grilling, broiling, baking, poaching, steaming, or stir-frying.  Talk to a dietitian to learn about healthy cooking methods and how to eat less sodium. Medicines  Take over-the-counter and prescription medicines only as told by your health care provider.  Do not take these medicines unless your health care provider approves: ? Nonsteroidal anti-inflammatory drugs (NSAIDs), such as ibuprofen, naproxen, or celecoxib. ? Vitamin supplements that contain vitamin A or vitamin E. ? Hormone replacement therapy that contains estrogen. Activity  Join a cardiac rehabilitation  program.  Ask your health care provider: ? What activities and exercises are safe for you. ? If you should follow specific instructions about lifting, driving, or climbing stairs.  If you are taking aspirin and another blood thinning medicine, avoid activities that are likely to result in an injury. The medicines can increase your risk of bleeding. Lifestyle  Do not use any products that contain nicotine or tobacco, such as cigarettes and e-cigarettes. If you need help quitting, ask your health care provider.  If you drink alcohol and your health care provider says it is okay to drink, limit your alcohol intake to no more than 1 drink per day. One drink equals 12 oz of beer, 5 oz of wine, or 1 oz of hard liquor.  Maintain a healthy weight. If you need to lose weight, do it in a way that has been approved by your health care provider. General instructions  Tell all your health care providers about your heart condition, including your dentist. Some medicines can increase your risk of arrhythmia.  Manage other health conditions, such as hypertension and diabetes. These conditions affect your heart.  Learn ways to manage stress.  Get screened for depression, and seek treatment if needed.  Monitor your blood pressure if told by your health care provider.  Keep your vaccinations up to date. Get the annual influenza vaccine.  Keep all follow-up visits as told by your health care provider. This is important. Contact a health care provider if:  You feel overwhelmed or sad.  You have trouble with your daily activities. Get help right away if:  You have pain in your chest, neck, arm, jaw, stomach, or back that recurs, and: ? Lasts more than a few minutes. ? Is not relieved by taking the medicineyour health care provider prescribed.  You have unexplained: ? Heavy sweating. ? Heartburn or indigestion. ? Shortness of breath. ? Difficulty breathing. ? Nausea or  vomiting. ? Fatigue. ? Nervousness or anxiety. ? Weakness. ? Diarrhea. ? Dark stools or blood in the stool.  You have sudden lightheadedness or dizziness.  Your blood pressure is higher than  180/120  You faint.  You feel like hurting yourself or think about taking your own life. These symptoms may represent a serious problem that is an emergency. Do not wait to see if the symptoms will go away. Get medical help right away. Call your local emergency services (911 in the [place] Do not drive yourself to the clinic or hospital. Summary  Acute coronary syndrome (ACS) is a when there is not enough blood and oxygen being supplied to the heart. ACS can result in chest pain or a heart attack.  Acute coronary syndrome is a medical emergency. If you have any symptoms of this condition, get help right away.  Treatment includes oxygen, medicines, and procedures to open the blocked arteries and restore blood flow. This information is not intended to replace advice given to you by your health care provider. Make sure you discuss any questions you have with your health care provider. Document Released: 07/10/2005 Document Revised: 08/11/2016 Document Reviewed: 08/11/2016 Elsevier Interactive Patient Education  [employer]2018 [employer]

## 2018-01-25 NOTE — Progress Notes (Addendum)
Progress Note  Patient Name: Steve Burnett Date of Encounter: 01/25/2018  Primary Cardiologist: Kirke Corin  Subjective   No further chest pain. He did have some left shoulder pain in the afternoon of 7/4, though not similar to his presenting pain and feels like this was MSK as it was worse with ROM. Currently, symptom free. Has been ambulating in the room without issues. No further episodes of Afib or VF. Tolerating medications without issues. BP in the 130s to 140s systolic. Leukocytosis noted 7/4.   Inpatient Medications    Scheduled Meds: . aspirin  81 mg Oral Daily  . carvedilol  12.5 mg Oral BID WC  . lisinopril  20 mg Oral Daily  . mouth rinse  15 mL Mouth Rinse BID  . rosuvastatin  10 mg Oral q1800  . sodium chloride flush  3 mL Intravenous Q12H  . ticagrelor  90 mg Oral BID   Continuous Infusions: . sodium chloride     PRN Meds: sodium chloride, acetaminophen, ALPRAZolam, hydrALAZINE, ondansetron (ZOFRAN) IV, oxyCODONE-acetaminophen, promethazine, sodium chloride flush   Vital Signs    Vitals:   01/24/18 1457 01/24/18 1717 01/24/18 1949 01/25/18 0344  BP: (!) 142/90 (!) 139/98 (!) 138/98 132/85  Pulse: 64 66 73 67  Resp: 18  17 18   Temp: 98.5 F (36.9 C) 98.3 F (36.8 C) 98.4 F (36.9 C) 98.4 F (36.9 C)  TempSrc: Oral Oral Oral   SpO2: 97% 99% 98% 97%  Weight: (!) 309 lb 6.4 oz (140.3 kg)     Height: 5\' 10"  (1.778 m)       Intake/Output Summary (Last 24 hours) at 01/25/2018 4098 Last data filed at 01/24/2018 2100 Gross per 24 hour  Intake 459.93 ml  Output 0 ml  Net 459.93 ml   Filed Weights   01/23/18 0845 01/23/18 0853 01/24/18 1457  Weight: (!) 310 lb (140.6 kg) (!) 309 lb 15.5 oz (140.6 kg) (!) 309 lb 6.4 oz (140.3 kg)    Telemetry    NSR with occasional PVCs - Personally Reviewed  ECG    n/a - Personally Reviewed  Physical Exam   GEN: No acute distress.   Neck: No JVD. Cardiac: RRR, no murmurs, rubs, or gallops. Right femoral cardiac  cath site without bleeding, bruising, swelling, erythema, warmth, or TTP. No bruit.  Respiratory: Clear to auscultation bilaterally.  GI: Soft, nontender, non-distended.   MS: No edema; No deformity. Neuro:  Alert and oriented x 3; Nonfocal.  Psych: Normal affect.  Labs    Chemistry Recent Labs  Lab 01/23/18 0847 01/24/18 0814  NA 141 138  K 3.4* 3.9  CL 107 106  CO2 22 25  GLUCOSE 147* 122*  BUN 9 11  CREATININE 0.69 0.39*  CALCIUM 9.4 8.6*  PROT 7.9  --   ALBUMIN 4.1  --   AST 36  --   ALT 33  --   ALKPHOS 66  --   BILITOT 0.7  --   GFRNONAA >60 >60  GFRAA >60 >60  ANIONGAP 12 7     Hematology Recent Labs  Lab 01/23/18 0847 01/24/18 0814  WBC 7.8 12.3*  RBC 5.69 5.39  HGB 15.0 14.1  HCT 44.9 42.8  MCV 78.9* 79.4*  MCH 26.3 26.2  MCHC 33.4 33.0  RDW 15.4* 15.8*  PLT 381 275    Cardiac Enzymes Recent Labs  Lab 01/23/18 0847 01/23/18 1223 01/23/18 1655 01/23/18 2310  TROPONINI 0.05* 2.68* 7.12* 18.45*   No results for  input(s): TROPIPOC in the last 168 hours.   BNPNo results for input(s): BNP, PROBNP in the last 168 hours.   DDimer No results for input(s): DDIMER in the last 168 hours.   Radiology    Dg Chest Port 1 View  Result Date: 01/23/2018 IMPRESSION: There is no acute pulmonary edema. Mild stable enlargement of the cardiac silhouette. Electronically Signed   By: David  SwazilandJordan M.D.   On: 01/23/2018 09:08    Cardiac Studies   LHC 01/23/2018: Conclusion     Prox RCA lesion is 100% stenosed.  A drug-eluting stent was successfully placed using a STENT SIERRA 3.50 X 15 MM.  Post intervention, there is a 0% residual stenosis.  Post Atrio lesion is 70% stenosed.  Ost LAD to Prox LAD lesion is 75% stenosed.  Ost 1st Mrg lesion is 50% stenosed.  Ost 2nd Mrg to 2nd Mrg lesion is 50% stenosed.  Mid LAD to Dist LAD lesion is 75% stenosed.   1.  Acute inferior ST elevation myocardial infarction 2.  Occluded proximal RCA 3.  Residual  70 to 75% in-stent restenosis proximal LAD 4.  Mild reduced left ventricular function with inferior wall hypokinesis 5.  Successful primary PCI with DES proximal RCA     TTE 01/23/2018: Study Conclusions  - Left ventricle: The cavity size was normal. Systolic function was   moderately reduced. The estimated ejection fraction was in the   range of 35% to 40%. Diffuse hypokinesis. Hypokinesis of the   inferior myocardium. Doppler parameters are consistent with   abnormal left ventricular relaxation (grade 1 diastolic   dysfunction). - Left atrium: The atrium was normal in size. - Right ventricle: Systolic function was normal. - Pulmonary arteries: Systolic pressure was within the normal   range.  Impressions:  - Challenging image quality.  Patient Profile     57 y.o. male with history of coronary artery disease with a non-STEMI s/p PCI/DES to the proximal LAD in 02/2012, noncompliance, HTN, HLD, obesity, and OSA who presented to [facility] on 01/23/2018 with an inferior STEMI s/p PCI/DES to the RCA complicated by VF arrest x 2 during the procedure.   Assessment & Plan    1. Inferior STEMI/CAD: -Currently, chest pain free -Troponin peaked at 18.45 -DAPT with ASA 81 mg daily and Brilinta 90 mg bid for at least the next 12 months without interruption  -Residual 70-75% proximal edge ISR of the LAD -Post-cath instructions -Cardiac rehab -Aggressive risk factor modification with secondary prevention -Has a Brilinta card -Importance of DAPT was discussed in detail -Consider outpatient Myoview to evaluate significance of LAD lesion  2. Acute systolic CHF due to ICM: -He does not appear grossly volume up at this time -Add spironolactone 12.5 mg daily, will need a recheck bmet on 7/8 in our office (message sent) -Continue Coreg 12.5 mg bid and lisinopril 20 mg daily -CHF education -Plan for repeat echo as an outpatient in ~ 3 months  3. VF arrest: -Now off amiodarone gtt -No further  episodes -Coreg as above -Potassium improving, spironolactone added as above -Magnesium at goal  4. PAF: -Noted after cath -Hold on starting DOAC at this time given need for DAPT -If he has further episodes would need to consider long term anticoagulation  -Coreg as above  5. HTN: -Blood pressure in the 130s to 140s systolic -Continue Coreg 12.5 mg bid and lisinopril 20 mg daily -Add spironolactone as above  6. HLD: -LDL 132 this admission -Goal LDL < 70 -Crestor 10  mg daily -Will need fasting lipid and liver function in ~ 8 weeks as an outpatient  7. Obesity/OSA: -Weight loss advised  8. Noncompliance: -Compliance advised  For questions or updates, please contact [facility] Please consult [email or web address] for contact info under Cardiology/STEMI.    Signed, Eula Listen, PA-C [facility] Pager: [phone] 01/25/2018, 7:12 AM

## 2018-01-25 NOTE — Discharge Summary (Signed)
Discharge Summary    Patient ID: Steve Burnett  MRN: [id number], DOB/AGE: Feb 13, 1961 57 y.o.  Admit Date: 01/23/2018 Discharge Date: 01/25/2018  Primary Care Provider: Lorre Munroe, NP Primary Cardiologist: Dr. Kirke Corin, MD  Discharge Diagnoses    Principal Problem:   STEMI involving oth coronary artery of inferior wall [facility]) Active Problems:   Coronary artery disease   VF (ventricular fibrillation) (HCC)   Noncompliance   Acute systolic CHF (congestive heart failure) (HCC)   Ischemic cardiomyopathy   Essential hypertension   Anxiety   Hyperlipidemia   Severe obesity (BMI >= 40) (HCC)   OSA (obstructive sleep apnea)   Hypokalemia   Hyperglycemia   Allergies Allergies  Allergen Reactions  . Atorvastatin Other (See Comments)    Muscle cramps     History of Present Illness     57 year old male with history of CAD with NSTEMI in 02/2012 s/p PCI/DES to the LAD as detailed below, HTN, HLD, obesity, OSA on CPAP, chronic low back pain, and noncompliance who presented to [facility] on 7/3 with an inferior STEMI.   Patient was admitted to the hospital in 02/2012 with a NSTEMI. LHC showed severe one-vessel CAD involving the LAD with 60% proximal, diffuse 80% midsegment and 70% discrete distal LAD stenoses. He underwent successful PCI/DES with a long drug-eluting stent to the proximal and mid LAD. The distal LAD was left to be managed medically. Patient was last seen in the office in 11/2016 and was doing reasonably well at that time. Unfortunately, he stopped taking all of his medication except for ASA 81 mg daily ~ 3 months prior.   Patient presented to [facility] on 7/3 with 10/10 chest pain. He was noted to have inferior ST elevations. Code STEMI was called and he was taken emergently to the cath lab.   Hospital Course     Consultants: pharmacy  Patient underwent LHC on 7/3 emergently that showed ostial to proximal LAD 75% stenosed at the proximal edge of the previously placed stent, mid  LAD 75% stenosed, OM1 50% stenosed, OM2 50% stenosed, proximal RCA 100% stenosed, rPLA 70% stenosed. He underwent successful PCI/DES to the proximal RCA with a SIERRA 3.50 x 15 mm DES with post-intervention TIMI 3 flow. LV gram showed an estimated EF of 44% with inferoapical hypokinesis. Case was complicated by 2 episodes of VF requiring defibrillation as well as an episodes of Afib. He was given an amiodarone bolus and started on an amiodarone gtt. He was transferred to the ICU. Troponin peaked at 18.45. He remained on amiodarone gtt until 7/4, at which time this gtt was discontinued as the patient had not further ectopy. He was placed on ASA Brilinta, Coreg, Crestor, and lisinopril. He was transferred to telemetry on 7/4. He was able to ambulate without difficulty. Echo on 7/3 showed an EF of 35-40% hypokinesis of the inferior myocardium, Gr1DD, normal size LA, RVSF normal, PASP normal (challenging imaging quality). Labs showed an LDL of 132 (not on a statin upon admission), K+ 3.4-->3.9, SCr 0.69, glucose 147, normal LFT, magnesium 2.2, TSH 0.964, A1c 6.0, WBC 7.8-->12.3, HGB 15-->14.1. He was started on spironolactone on day of discharge and will need a follow up BMET and CBC early the following week. BP mildly elevated into the 140s to 150s systolic (spironolactone added as above). Discharge weight 309 pounds. Post cath instructions were discussed in detail. He has been seen by care manager and been given a Brilinta card.    The patient's right femoral  cath site has been examined is healing well without issues at this time. The patient has been seen by Dr. Mariah Milling and felt to be stable for discharge today. All follow up appointments have been made. Discharge medications are listed below. Prescriptions have been reviewed with the patient and printed.  _____________  Discharge Vitals Blood pressure (!) 141/98, pulse 80, temperature 98.4 F (36.9 C), resp. rate 18, height 5\' 10"  (1.778 m), weight (!) 309 lb  6.4 oz (140.3 kg), SpO2 97 %.  Filed Weights   01/23/18 0845 01/23/18 0853 01/24/18 1457  Weight: (!) 310 lb (140.6 kg) (!) 309 lb 15.5 oz (140.6 kg) (!) 309 lb 6.4 oz (140.3 kg)    Labs & Radiologic Studies    CBC Recent Labs    01/23/18 0847 01/24/18 0814  WBC 7.8 12.3*  NEUTROABS 5.1  --   HGB 15.0 14.1  HCT 44.9 42.8  MCV 78.9* 79.4*  PLT 381 275   Basic Metabolic Panel Recent Labs    81/19/14 0847 01/24/18 0814  NA 141 138  K 3.4* 3.9  CL 107 106  CO2 22 25  GLUCOSE 147* 122*  BUN 9 11  CREATININE 0.69 0.39*  CALCIUM 9.4 8.6*  MG 2.2  --    Liver Function Tests Recent Labs    01/23/18 0847  AST 36  ALT 33  ALKPHOS 66  BILITOT 0.7  PROT 7.9  ALBUMIN 4.1   No results for input(s): LIPASE, AMYLASE in the last 72 hours. Cardiac Enzymes Recent Labs    01/23/18 1223 01/23/18 1655 01/23/18 2310  TROPONINI 2.68* 7.12* 18.45*   BNP Invalid input(s): POCBNP D-Dimer No results for input(s): DDIMER in the last 72 hours. Hemoglobin A1C Recent Labs    01/23/18 0847  HGBA1C 6.0*   Fasting Lipid Panel Recent Labs    01/23/18 0847  CHOL 204*  HDL 43  LDLCALC 132*  TRIG 144  CHOLHDL 4.7   Thyroid Function Tests Recent Labs    01/23/18 0847  TSH 0.964   _____________  Dg Chest Port 1 View  Result Date: 01/23/2018 CLINICAL DATA:  Onset of pressure-like chest pain 30 minutes prior to presentation. Associated diaphoresis, dizziness, and shortness of breath. History of previous MI. EXAM: PORTABLE CHEST 1 VIEW COMPARISON:  Chest x-ray of February 02, 2014 FINDINGS: The lungs are well-expanded and clear. The cardiac silhouette is enlarged. The pulmonary vascularity is normal. The mediastinum is normal in width. There is no pleural effusion or pneumothorax. External pacemaker defibrillator pads are present. IMPRESSION: There is no acute pulmonary edema. Mild stable enlargement of the cardiac silhouette. Electronically Signed   By: David  Swaziland M.D.   On:  01/23/2018 09:08    Diagnostic Studies/Procedures   LHC 01/23/2018: Coronary Findings   Diagnostic  Dominance: Right  Left Anterior Descending  Ost LAD to Prox LAD lesion 75% stenosed  Ost LAD to Prox LAD lesion is 75% stenosed. The lesion is discrete. The lesion was previously treated.  Mid LAD to Dist LAD lesion 75% stenosed  Mid LAD to Dist LAD lesion is 75% stenosed. The lesion is segmental and irregular.  Left Circumflex  First Obtuse Marginal Branch  Ost 1st Mrg lesion 50% stenosed  Ost 1st Mrg lesion is 50% stenosed. The lesion is tubular.  Second Obtuse Marginal Branch  Ost 2nd Mrg to 2nd Mrg lesion 50% stenosed  Ost 2nd Mrg to 2nd Mrg lesion is 50% stenosed. The lesion is tubular.  Right Coronary Artery  Prox  RCA lesion 100% stenosed  Prox RCA lesion is 100% stenosed.  Right Posterior Atrioventricular Branch  Post Atrio lesion 70% stenosed  Post Atrio lesion is 70% stenosed. The lesion is tubular.  Intervention   Prox RCA lesion  Stent  Lesion crossed with guidewire. Pre-stent angioplasty was performed using a BALLOON TREK RX 2.5X15. A drug-eluting stent was successfully placed using a STENT SIERRA 3.50 X 15 MM. Stent strut is well apposed. Stent does not overlap previously placed stentPost-stent angioplasty was performed using a BALLOON [place] TREK RX 4.0X15.  Post-Intervention Lesion Assessment  The intervention was successful. Pre-interventional TIMI flow is 0. Post-intervention TIMI flow is 3. No complications occurred at this lesion.  There is no residual stenosis post intervention.  Wall Motion      EF 44%          Coronary Diagrams   Diagnostic Diagram       Post-Intervention Diagram        Conclusion     Prox RCA lesion is 100% stenosed.  A drug-eluting stent was successfully placed using a STENT SIERRA 3.50 X 15 MM.  Post intervention, there is a 0% residual stenosis.  Post Atrio lesion is 70% stenosed.  Ost LAD to Prox LAD lesion is 75%  stenosed.  Ost 1st Mrg lesion is 50% stenosed.  Ost 2nd Mrg to 2nd Mrg lesion is 50% stenosed.  Mid LAD to Dist LAD lesion is 75% stenosed.   1.  Acute inferior ST elevation myocardial infarction 2.  Occluded proximal RCA 3.  Residual 70 to 75% in-stent restenosis proximal LAD 4.  Mild reduced left ventricular function with inferior wall hypokinesis 5.  Successful primary PCI with DES proximal RCA    Echo 01/23/2018: Study Conclusions  - Left ventricle: The cavity size was normal. Systolic function was   moderately reduced. The estimated ejection fraction was in the   range of 35% to 40%. Diffuse hypokinesis. Hypokinesis of the   inferior myocardium. Doppler parameters are consistent with   abnormal left ventricular relaxation (grade 1 diastolic   dysfunction). - Left atrium: The atrium was normal in size. - Right ventricle: Systolic function was normal. - Pulmonary arteries: Systolic pressure was within the normal   range.  Impressions:  - Challenging image quality. _____________  Disposition   Pt is being discharged home today in good condition.  Follow-up Plans & Appointments    Follow-up Information    Sondra Barges, PA-C Follow up on 02/05/2018.   Specialties:  Physician Assistant, Cardiology, Radiology Why:  Appointment time: 2 PM Contact information: [address] [place] [address] [phone]          Discharge Instructions    (HEART FAILURE PATIENTS) Call MD:  Anytime you have any of the following symptoms: 1) 3 pound weight gain in 24 hours or 5 pounds in 1 week 2) shortness of breath, with or without a dry hacking cough 3) swelling in the hands, feet or stomach 4) if you have to sleep on extra pillows at night in order to breathe.   Complete by:  As directed    AMB Referral to Cardiac Rehabilitation - Phase II   Complete by:  As directed    Diagnosis:  STEMI   AMB Referral to Phase II Cardiac Rehab   Complete by:  As directed     Diagnosis:  Coronary Stents   Call MD for:  difficulty breathing, headache or visual disturbances   Complete by:  As directed    Call MD for:  extreme fatigue   Complete by:  As directed    Call MD for:  hives   Complete by:  As directed    Call MD for:  persistant dizziness or light-headedness   Complete by:  As directed    Call MD for:  persistant nausea and vomiting   Complete by:  As directed    Call MD for:  redness, tenderness, or signs of infection (pain, swelling, redness, odor or green/yellow discharge around incision site)   Complete by:  As directed    Call MD for:  severe uncontrolled pain   Complete by:  As directed    Call MD for:  temperature >100.4   Complete by:  As directed    Diet - low sodium heart healthy   Complete by:  As directed    Increase activity slowly   Complete by:  As directed       Discharge Medications   Allergies as of 01/25/2018      Reactions   Atorvastatin Other (See Comments)   Muscle cramps      Medication List    STOP taking these medications   amLODipine 10 MG tablet Commonly known as:  NORVASC   citalopram 10 MG tablet Commonly known as:  CELEXA   citalopram 20 MG tablet Commonly known as:  CELEXA   gabapentin 100 MG capsule Commonly known as:  NEURONTIN   ibuprofen 200 MG tablet Commonly known as:  ADVIL,MOTRIN   predniSONE 10 MG tablet Commonly known as:  DELTASONE     TAKE these medications   acetaminophen 650 MG CR tablet Commonly known as:  TYLENOL Take 650 mg by mouth as needed.   ALPRAZolam 0.5 MG tablet Commonly known as:  XANAX TAKE ONE TABLET BY MOUTH AT BEDTIME AS NEEDED FOR ANXIETY   aspirin 81 MG chewable tablet Chew 1 tablet (81 mg total) by mouth daily. Start taking on:  01/26/2018   carvedilol 12.5 MG tablet Commonly known as:  COREG Take 1 tablet (12.5 mg total) by mouth 2 (two) times daily with a meal.   lisinopril 20 MG tablet Commonly known as:  PRINIVIL,ZESTRIL Take 1 tablet (20 mg  total) by mouth daily. Start taking on:  01/26/2018 What changed:    medication strength  how much to take   rosuvastatin 10 MG tablet Commonly known as:  CRESTOR Take 1 tablet (10 mg total) by mouth daily at 6 PM. What changed:    medication strength  how much to take  when to take this   spironolactone 25 MG tablet Commonly known as:  ALDACTONE Take 0.5 tablets (12.5 mg total) by mouth daily. Start taking on:  01/26/2018   ticagrelor 90 MG Tabs tablet Commonly known as:  BRILINTA Take 1 tablet (90 mg total) by mouth 2 (two) times daily.   ticagrelor 90 MG Tabs tablet Commonly known as:  BRILINTA Take 1 tablet (90 mg total) by mouth 2 (two) times daily.         Aspirin prescribed at discharge?  Yes High Intensity Statin Prescribed? (Lipitor 40-80mg  or Crestor 20-40mg ): Yes Beta Blocker Prescribed? Yes For EF <40%, was ACEI/ARB Prescribed? Yes ADP Receptor Inhibitor Prescribed? (i.e. Plavix etc.-Includes Medically Managed Patients): Yes For EF <40%, Aldosterone Inhibitor Prescribed? Yes Was EF assessed during THIS hospitalization? Yes Was Cardiac Rehab II ordered? (Included Medically managed Patients): Yes   Outstanding Labs/Studies   CBC/BMET early in the week of 7/8  Duration of Discharge Encounter   Greater than 30 minutes including physician time.  Signed, Sondra Bargesyan M, Kevan Prouty, PA-C [facility] Pager: [phone] 01/25/2018, 11:02 AM

## 2018-01-25 NOTE — Progress Notes (Signed)
IV and tele removed from patient. Discharge instructions given to patient along with hard copy prescriptions. Verbalized understanding. No acute distress. Son to transport patient home.

## 2018-01-26 ENCOUNTER — Other Ambulatory Visit: Payer: Self-pay | Admitting: Internal Medicine

## 2018-01-26 DIAGNOSIS — F411 Generalized anxiety disorder: Secondary | ICD-10-CM

## 2018-01-26 MED ORDER — ALPRAZOLAM 0.5 MG PO TABS: ORAL_TABLET | ORAL | 0 refills | Status: DC

## 2018-01-28 ENCOUNTER — Other Ambulatory Visit: Payer: Self-pay | Admitting: *Deleted

## 2018-01-28 ENCOUNTER — Other Ambulatory Visit: Payer: Self-pay | Admitting: Internal Medicine

## 2018-01-28 NOTE — Telephone Encounter (Signed)
Copied from CRM [id number]. Topic: Quick Communication - Rx Refill/Question >> Jan 28, 2018 12:10 PM Floria RavelingStovall, Shana A wrote: Medication: ALPRAZolam Prudy Feeler(XANAX) 0.5 MG tablet [Pharmacy Med Name:  Has the patient contacted their pharmacy?yes  (Agent: If no, request that the patient contact the pharmacy for the refill.) (Agent: If yes, when and what did the pharmacy advise?)  Preferred Pharmacy (with phone number or street name): [id number] - [place], [place] - [address] [phone] (Phone - request already been sent to provider, pt is requesting to have it fill today.,   he is having a hard time and needs this today   Agent: Please be advised that RX refills may take up to 3 business days. We ask that you follow-up with your pharmacy.

## 2018-01-28 NOTE — Telephone Encounter (Signed)
Per [facility], pt picked up Rx today

## 2018-01-29 NOTE — Telephone Encounter (Signed)
Patient contacted regarding discharge from [facility] on 01/25/18.  Patient understands to follow up with provider Eula Listenyan Dunn, PA on 02/05/18 at 2:00 [facility]. Patient understands discharge instructions? Yes Patient understands medications and regiment? Yes Patient understands to bring all medications to this visit? Yes

## 2018-01-31 ENCOUNTER — Telehealth: Payer: Self-pay | Admitting: Cardiovascular Disease

## 2018-01-31 NOTE — Telephone Encounter (Signed)
Received request from [facility] .  Sent to [facility] via interoffice mail.

## 2018-02-01 ENCOUNTER — Encounter: Payer: Self-pay | Admitting: Physician Assistant

## 2018-02-01 NOTE — Progress Notes (Signed)
Cardiology Office Note Date:  02/05/2018  Patient ID:  Steve, Burnett April 06, 1961, MRN [id number] PCP:  Lorre Munroe, NP  Cardiologist:  Dr. Kirke Corin, MD    Chief Complaint: Hospital follow up  History of Present Illness: Steve Burnett is a 57 y.o. male with history of CAD with NSTEMI in 02/2012 s/p PCI/DES to the LAD as detailed below s/p inferior STEMI 01/23/2018 complicated by VF arrest during the procedure s/p PCI/DES to the RCA as below, chronic systolic CHF due to ICM, HTN, HLD, obesity, OSA on CPAP, chronic low back pain, and noncompliance who presents for hospital follow up after recent admission to [facility] from 7/3-7/5 for an inferior STEMI.   Patient was admitted to the hospital in 02/2012 with a NSTEMI. LHC showed severe one-vessel CAD involving the LAD with 60% proximal, diffuse 80% midsegment and 70% discrete distal LAD stenoses. He underwent successful PCI/DES with a long drug-eluting stent to the proximal and mid LAD. The distal LAD was left to be managed medically. Patient was last seen in the office in 11/2016 and was doing reasonably well at that time. Unfortunately, he stopped taking all of his medication except for ASA 81 mg daily ~ 10/2017.   He was admitted to [facility] on 01/23/2018 with an inferior STEMI. He underwent emergent LHC that showed ostial to proximal LAD 75% stenosed at the proximal edge of the previously placed stent, mid LAD 75% stenosed, OM1 50% stenosed, OM2 50% stenosed, proximal RCA 100% stenosed, rPLA 70% stenosed. He underwent successful PCI/DES to the proximal RCA with a SIERRA 3.50 x 15 mm DES with post-intervention TIMI 3 flow. LV gram showed an estimated EF of 44% with inferoapical hypokinesis. Case was complicated by 2 episodes of VF requiring defibrillation as well as an episode of Afib. He was given an amiodarone bolus and started on an amiodarone gtt with resolution of ventricular ectopy as well as Afib. He was not continued on amiodarone. Troponin peaked at  18.45. Echo on 7/3 showed an EF of 35-40%, hypokinesis of the inferior myocardium, Gr1DD, normal sized left atrium, RVSF normal, PASP normal, challenging image quality. Labs during his admission showed an LDL of 132 (not on a statin at time of admission), LFT normal, A1c 6.0, magnesium 2.2, K+ 3.9, SCr 0.69, HGB 14.1, TSH 0.964. He was discharged on ASA 81 mg, Brilinta 90 mg bid, Coreg 12.5 mg bid, lisinopril 20 mg, Crestor 10 mg, spironolactone 12.5 mg, along with his non-cardiac medications. Discharge weight of 309 pounds.   He comes in doing well today. No further chest pain. No SOB, palpitations, nausea, vomiting, diaphoresis, dizziness, presyncope, or syncope. Tolerating DAPT without issues. Has not missed any doses of DAPT. Is willing to pay cash price for Brilinta if needed. Blood pressure well controlled. No orthopnea, PND, or cough. No complications from his cath site. On the day of his hospital discharge, he drove to [place] to pick up a smoker from [employer] and built it. He has also changed out the brakes and rotors on his car since his discharge, all without issue. He does not have any concerns today.   Past Medical History:  Diagnosis Date  . Anxiety   . Chicken pox   . Coronary artery disease    a. 02/2012 NSTEMI/Cath/PCI: pLAD 60%, mLAD 80%, dLAD 70, nonobs LCX/RCA --> DES to prox/mid LAD; b. inf STEMI 01/23/18: ost/proxLAD 75% at the prox edge of the previously placed stent, mLAD 75%, OM1 50%, OM2 50%, pRCA 100% s/p  PCI/DES, rPLA 70%  . Hyperlipidemia   . Hypertension   . Ischemic cardiomyopathy    a. echo 7/19: EF 35-40%, HK of the inf wall, Gr1DD, nl sized LA, RVSF nl, PASP nl, challenging image quality  . Obesity   . OSA (obstructive sleep apnea) 03/27/2014  . PAF (paroxysmal atrial fibrillation) (HCC)    a. noted during [facility] 01/23/18 during inferior STEMI; b. resolved with IV amiodarone; c. not placed on anticoagulation given brief episode, acute illness, and need for DAPT  . VF  (ventricular fibrillation) (HCC)    a. noted during [facility] 01/23/2018 s/p defibrillation x 2    Past Surgical History:  Procedure Laterality Date  . CARDIAC CATHETERIZATION  03/12/2012  . CORONARY ANGIOPLASTY  03/12/2012   s/p drug eluting stent to the mid LAD : 3.5 x 38 mm Xience drug-eluting stent. Post dilated to 4.0 in mid segment and 4.5 proximally.  Rosalie Doctor ACUTE MI REVASCULARIZATION N/A 01/23/2018   Procedure: Coronary/Graft Acute MI Revascularization;  Surgeon: Marcina Millard, MD;  Location: [facility] INVASIVE CV LAB;  Service: Cardiovascular;  Laterality: N/A;  . LEFT HEART CATH AND CORONARY ANGIOGRAPHY N/A 01/23/2018   Procedure: LEFT HEART CATH AND CORONARY ANGIOGRAPHY;  Surgeon: Marcina Millard, MD;  Location: [facility] INVASIVE CV LAB;  Service: Cardiovascular;  Laterality: N/A;    Current Meds  Medication Sig  . acetaminophen (TYLENOL) 650 MG CR tablet Take 650 mg by mouth as needed.  . ALPRAZolam (XANAX) 0.5 MG tablet TAKE ONE TABLET BY MOUTH AT BEDTIME AS NEEDED FOR ANXIETY  . aspirin 81 MG chewable tablet Chew 1 tablet (81 mg total) by mouth daily.  . carvedilol (COREG) 12.5 MG tablet Take 1 tablet (12.5 mg total) by mouth 2 (two) times daily with a meal.  . lisinopril (PRINIVIL,ZESTRIL) 20 MG tablet Take 1 tablet (20 mg total) by mouth daily.  . rosuvastatin (CRESTOR) 10 MG tablet Take 1 tablet (10 mg total) by mouth daily at 6 PM.  . spironolactone (ALDACTONE) 25 MG tablet Take 0.5 tablets (12.5 mg total) by mouth daily.  . ticagrelor (BRILINTA) 90 MG TABS tablet Take 1 tablet (90 mg total) by mouth 2 (two) times daily.    Allergies:   Atorvastatin   Social History:  The patient  reports that he has never smoked. He has never used smokeless tobacco. He reports that he drinks about 1.2 oz of alcohol per week. He reports that he does not use drugs.   Family History:  The patient's family history includes Heart attack (age of onset: 4) in his mother; Heart disease in  his mother; Heart disease (age of onset: 64) in his sister; Hyperlipidemia in his mother; Hypertension in his mother; Stroke in his father.  ROS:   Review of Systems  Constitutional: Negative for chills, diaphoresis, fever, malaise/fatigue and weight loss.  HENT: Negative for congestion.   Eyes: Negative for discharge and redness.  Respiratory: Negative for cough, hemoptysis, sputum production, shortness of breath and wheezing.   Cardiovascular: Negative for chest pain, palpitations, orthopnea, claudication, leg swelling and PND.  Gastrointestinal: Negative for abdominal pain, blood in stool, heartburn, melena, nausea and vomiting.  Genitourinary: Negative for hematuria.  Musculoskeletal: Negative for falls and myalgias.  Skin: Negative for rash.  Neurological: Negative for dizziness, tingling, tremors, sensory change, speech change, focal weakness, loss of consciousness and weakness.  Endo/Heme/Allergies: Does not bruise/bleed easily.  Psychiatric/Behavioral: Negative for substance abuse. The patient is not nervous/anxious.   All other systems reviewed and are negative.  PHYSICAL EXAM:  VS:  BP 122/82 (BP Location: Left Arm, Patient Position: Sitting, Cuff Size: Large)   Pulse 74   Ht 5\' 10"  (1.778 m)   Wt (!) 310 lb 4 oz (140.7 kg)   BMI 44.52 kg/m  BMI: Body mass index is 44.52 kg/m.  Physical Exam  Constitutional: He is oriented to person, place, and time. He appears well-developed and well-nourished.  HENT:  Head: Normocephalic and atraumatic.  Eyes: Right eye exhibits no discharge. Left eye exhibits no discharge.  Neck: Normal range of motion. No JVD present.  Cardiovascular: Normal rate, regular rhythm, S1 normal, S2 normal and normal heart sounds. Exam reveals no distant heart sounds, no friction rub, no midsystolic click and no opening snap.  No murmur heard. Pulses:      Posterior tibial pulses are 2+ on the right side, and 2+ on the left side.  Right femoral cardiac  cath site without bleeding, bruising, swelling, erythema, warmth, swelling, or TTP. No bruit.   Pulmonary/Chest: Effort normal and breath sounds normal. No respiratory distress. He has no decreased breath sounds. He has no wheezes. He has no rales. He exhibits no tenderness.  Abdominal: Soft. He exhibits no distension. There is no tenderness.  Musculoskeletal: He exhibits no edema.  Neurological: He is alert and oriented to person, place, and time.  Skin: Skin is warm and dry. No cyanosis. Nails show no clubbing.  Psychiatric: He has a normal mood and affect. His speech is normal and behavior is normal. Judgment and thought content normal.     EKG:  Was ordered and interpreted by me today. Shows NSR, 74 bpm, interior TWI  Recent Labs: 01/23/2018: ALT 33; Magnesium 2.2; TSH 0.964 01/24/2018: BUN 11; Creatinine, Ser 0.39; Hemoglobin 14.1; Platelets 275; Potassium 3.9; Sodium 138  01/23/2018: Cholesterol 204; HDL 43; LDL Cholesterol 132; Total CHOL/HDL Ratio 4.7; Triglycerides 144; VLDL 29   Estimated Creatinine Clearance: 149.5 mL/min (A) (by C-G formula based on SCr of 0.39 mg/dL (L)).   Wt Readings from Last 3 Encounters:  02/05/18 (!) 310 lb 4 oz (140.7 kg)  01/24/18 (!) 309 lb 6.4 oz (140.3 kg)  03/28/17 (!) 336 lb (152.4 kg)     Other studies reviewed: Additional studies/records reviewed today include: summarized above  ASSESSMENT AND PLAN:  1. CAD of the native coronary arteries without angina s/p recent inferior STEMI: No symptoms concerning for angina at this time. He is doing well. Continue DAPT with ASA 81 mg daily and Brilinta 90 mg bid uninterrupted for at least the next 12 months. Samples of Brilinta were provided as well as a signed patient assistance form. Refills of Brilinta for 12 months have been sent in. He knows if he cannot obtain Brilinta for any reason he is to call our office anytime of day or night, 365 years per year for further instructions. He was noted to have a  residual 70-75% proximal ISR of the LAD. He has been symptom-free, though this lesion does concern him. We will schedule him for a functional study in 2-3 weeks to further evaluate the clinical significance of this lesion with possible PCI if study is abnormal. For now, given he is symptom free, we will continue medical management. Aggressive secondary prevention and risk factor modification.   2. HFrEF due to ICM: He does not appear grossly volume up at this time. Continue Coreg, lisinopril, and spironolactone. CHF education. Repeat echo in follow up.   3. History of VF: During STEMI as above. No  further episodes. Recent magnesium at goal. Thyroid function normal. Potassium ok. Continue Coreg 12.5 mg bid.   4. PAF: Brief episode noted during stress of STEMI as above. No further episodes. Has not been placed on full dose anticoagulation given need for DAPT. If he has any further episodes would need to consider long term anticoagulation.   5. HTN: Blood pressure well controlled today. Continue current medications.   6. HLD: Goal LDL < 70. LDL while admitted noted to be 132 (not on a statin at that time). Continue Crestor 10 mg daily. Recheck FLP/LFT in ~ 8 weeks.   7. Obesity/OSA: Weight loss advised. CPAP.   Disposition: F/u with Dr. Kirke Corin or an APP in 4-6 weeks.   Current medicines are reviewed at length with the patient today.  The patient did not have any concerns regarding medicines.  Signed, Eula Listen, PA-C 02/05/2018 1:57 PM     [facility] [address] [place], [place] [address] [phone]

## 2018-02-05 ENCOUNTER — Ambulatory Visit (INDEPENDENT_AMBULATORY_CARE_PROVIDER_SITE_OTHER): Payer: BLUE CROSS/BLUE SHIELD | Admitting: Physician Assistant

## 2018-02-05 ENCOUNTER — Encounter: Payer: Self-pay | Admitting: *Deleted

## 2018-02-05 ENCOUNTER — Encounter: Payer: Self-pay | Admitting: Physician Assistant

## 2018-02-05 VITALS — BP 122/82 | HR 74 | Ht 70.0 in | Wt 310.2 lb

## 2018-02-05 DIAGNOSIS — I2119 ST elevation (STEMI) myocardial infarction involving other coronary artery of inferior wall: Secondary | ICD-10-CM | POA: Diagnosis not present

## 2018-02-05 DIAGNOSIS — E7800 Pure hypercholesterolemia, unspecified: Secondary | ICD-10-CM

## 2018-02-05 DIAGNOSIS — I10 Essential (primary) hypertension: Secondary | ICD-10-CM

## 2018-02-05 DIAGNOSIS — I480 Paroxysmal atrial fibrillation: Secondary | ICD-10-CM

## 2018-02-05 DIAGNOSIS — I255 Ischemic cardiomyopathy: Secondary | ICD-10-CM | POA: Diagnosis not present

## 2018-02-05 DIAGNOSIS — I4901 Ventricular fibrillation: Secondary | ICD-10-CM

## 2018-02-05 DIAGNOSIS — I2510 Atherosclerotic heart disease of native coronary artery without angina pectoris: Secondary | ICD-10-CM | POA: Diagnosis not present

## 2018-02-05 MED ORDER — TICAGRELOR 90 MG PO TABS: 90.0000 mg | ORAL_TABLET | Freq: Two times a day (BID) | ORAL | 3 refills | Status: DC

## 2018-02-05 NOTE — Patient Instructions (Addendum)
Medication Instructions:  Your physician recommends that you continue on your current medications as directed. Please refer to the Current Medication list given to you today.   Labwork: none  Testing/Procedures: [facility] LEXISCAN MYOVIEW  Your caregiver has ordered a Stress Test with nuclear imaging. The purpose of this test is to evaluate the blood supply to your heart muscle. This procedure is referred to as a "Non-Invasive Stress Test." This is because other than having an IV started in your vein, nothing is inserted or "invades" your body. Cardiac stress tests are done to find areas of poor blood flow to the heart by determining the extent of coronary artery disease (CAD). Some patients exercise on a treadmill, which naturally increases the blood flow to your heart, while others who are  unable to walk on a treadmill due to physical limitations have a pharmacologic/chemical stress agent called Lexiscan . This medicine will mimic walking on a treadmill by temporarily increasing your coronary blood flow.   Please note: these test may take anywhere between 2-4 hours to complete  PLEASE REPORT TO [facility] MEDICAL MALL ENTRANCE  THE VOLUNTEERS AT THE FIRST DESK WILL DIRECT YOU WHERE TO GO  Date of Procedure:_____________________________________  Arrival Time for Procedure:______________________________  Instructions regarding medication:   DO NOT TAKE YOUR SPIRONOLACTONE THE MORNING OF THE PROCEDURE.    PLEASE NOTIFY THE OFFICE AT LEAST 24 HOURS IN ADVANCE IF YOU ARE UNABLE TO KEEP YOUR APPOINTMENT.  [phone] AND  PLEASE NOTIFY NUCLEAR MEDICINE AT [facility] AT LEAST 24 HOURS IN ADVANCE IF YOU ARE UNABLE TO KEEP YOUR APPOINTMENT. [phone]  How to prepare for your Myoview test:  1. Do not eat or drink after midnight 2. No caffeine for 24 hours prior to test 3. No smoking 24 hours prior to test. 4. Your medication may be taken with water.  If your doctor stopped a medication because of this  test, do not take that medication. 5. Ladies, please do not wear dresses.  Skirts or pants are appropriate. Please wear a short sleeve shirt. 6. No perfume, cologne or lotion. Wear comfortable walking shoes.    Follow-Up: Your physician recommends that you schedule a follow-up appointment in: 4-6 WEEKS WITH DR ARIDA OR RYAN.  If you need a refill on your cardiac medications before your next appointment, please call your pharmacy.   Cardiac Nuclear Scan A cardiac nuclear scan is a test that measures blood flow to the heart when a person is resting and when he or she is exercising. The test looks for problems such as:  Not enough blood reaching a portion of the heart.  The heart muscle not working normally.  You may need this test if:  You have heart disease.  You have had abnormal lab results.  You have had heart surgery or angioplasty.  You have chest pain.  You have shortness of breath.  In this test, a radioactive dye (tracer) is injected into your bloodstream. After the tracer has traveled to your heart, an imaging device is used to measure how much of the tracer is absorbed by or distributed to various areas of your heart. This procedure is usually done at a hospital and takes 2-4 hours. Tell a health care provider about:  Any allergies you have.  All medicines you are taking, including vitamins, herbs, eye drops, creams, and over-the-counter medicines.  Any problems you or family members have had with the use of anesthetic medicines.  Any blood disorders you have.  Any surgeries you  have had.  Any medical conditions you have.  Whether you are pregnant or may be pregnant. What are the risks? Generally, this is a safe procedure. However, problems may occur, including:  Serious chest pain and heart attack. This is only a risk if the stress portion of the test is done.  Rapid heartbeat.  Sensation of warmth in your chest. This usually passes quickly.  What  happens before the procedure?  Ask your health care provider about changing or stopping your regular medicines. This is especially important if you are taking diabetes medicines or blood thinners.  Remove your jewelry on the day of the procedure. What happens during the procedure?  An IV tube will be inserted into one of your veins.  Your health care provider will inject a small amount of radioactive tracer through the tube.  You will wait for 20-40 minutes while the tracer travels through your bloodstream.  Your heart activity will be monitored with an electrocardiogram (ECG).  You will lie down on an exam table.  Images of your heart will be taken for about 15-20 minutes.  You may be asked to exercise on a treadmill or stationary bike. While you exercise, your heart's activity will be monitored with an ECG, and your blood pressure will be checked. If you are unable to exercise, you may be given a medicine to increase blood flow to parts of your heart.  When blood flow to your heart has peaked, a tracer will again be injected through the IV tube.  After 20-40 minutes, you will get back on the exam table and have more images taken of your heart.  When the procedure is over, your IV tube will be removed. The procedure may vary among health care providers and hospitals. Depending on the type of tracer used, scans may need to be repeated 3-4 hours later. What happens after the procedure?  Unless your health care provider tells you otherwise, you may return to your normal schedule, including diet, activities, and medicines.  Unless your health care provider tells you otherwise, you may increase your fluid intake. This will help flush the contrast dye from your body. Drink enough fluid to keep your urine clear or pale yellow.  It is up to you to get your test results. Ask your health care provider, or the department that is doing the test, when your results will be ready. Summary  A  cardiac nuclear scan measures the blood flow to the heart when a person is resting and when he or she is exercising.  You may need this test if you are at risk for heart disease.  Tell your health care provider if you are pregnant.  Unless your health care provider tells you otherwise, increase your fluid intake. This will help flush the contrast dye from your body. Drink enough fluid to keep your urine clear or pale yellow. This information is not intended to replace advice given to you by your health care provider. Make sure you discuss any questions you have with your health care provider. Document Released: 08/04/2004 Document Revised: 07/12/2016 Document Reviewed: 06/18/2013 Elsevier Interactive Patient Education  2017 [employer]    Medication Samples have been provided to the patient.  Drug name: BRILINTA       Strength: 90MG         Qty: 3 BOTTLES  LOT: [id number]  Exp.Date: 07/2020

## 2018-02-18 ENCOUNTER — Encounter: Payer: Self-pay | Admitting: *Deleted

## 2018-02-18 ENCOUNTER — Ambulatory Visit (INDEPENDENT_AMBULATORY_CARE_PROVIDER_SITE_OTHER): Payer: BLUE CROSS/BLUE SHIELD | Admitting: Internal Medicine

## 2018-02-18 ENCOUNTER — Ambulatory Visit: Payer: BLUE CROSS/BLUE SHIELD | Admitting: Internal Medicine

## 2018-02-18 ENCOUNTER — Encounter: Payer: BLUE CROSS/BLUE SHIELD | Attending: Cardiology | Admitting: *Deleted

## 2018-02-18 ENCOUNTER — Encounter: Payer: Self-pay | Admitting: Internal Medicine

## 2018-02-18 VITALS — Ht 70.2 in | Wt 318.9 lb

## 2018-02-18 VITALS — BP 126/86 | HR 81 | Temp 98.4°F | Wt 319.0 lb

## 2018-02-18 DIAGNOSIS — I2111 ST elevation (STEMI) myocardial infarction involving right coronary artery: Secondary | ICD-10-CM | POA: Diagnosis not present

## 2018-02-18 DIAGNOSIS — I10 Essential (primary) hypertension: Secondary | ICD-10-CM

## 2018-02-18 DIAGNOSIS — I213 ST elevation (STEMI) myocardial infarction of unspecified site: Secondary | ICD-10-CM | POA: Diagnosis not present

## 2018-02-18 DIAGNOSIS — R252 Cramp and spasm: Secondary | ICD-10-CM

## 2018-02-18 DIAGNOSIS — F411 Generalized anxiety disorder: Secondary | ICD-10-CM

## 2018-02-18 DIAGNOSIS — E7800 Pure hypercholesterolemia, unspecified: Secondary | ICD-10-CM

## 2018-02-18 DIAGNOSIS — I5031 Acute diastolic (congestive) heart failure: Secondary | ICD-10-CM

## 2018-02-18 LAB — COMPREHENSIVE METABOLIC PANEL
ALBUMIN: 3.7 g/dL (ref 3.5–5.2)
ALK PHOS: 62 U/L (ref 39–117)
ALT: 15 U/L (ref 0–53)
AST: 14 U/L (ref 0–37)
BILIRUBIN TOTAL: 0.3 mg/dL (ref 0.2–1.2)
BUN: 13 mg/dL (ref 6–23)
CALCIUM: 9.4 mg/dL (ref 8.4–10.5)
CO2: 23 meq/L (ref 19–32)
CREATININE: 0.75 mg/dL (ref 0.40–1.50)
Chloride: 107 mEq/L (ref 96–112)
GFR: 115.03 mL/min (ref >60.00)
Glucose, Bld: 100 mg/dL — ABNORMAL HIGH (ref 70–99)
Potassium: 4.2 mEq/L (ref 3.5–5.1)
Sodium: 139 mEq/L (ref 135–145)
TOTAL PROTEIN: 7 g/dL (ref 6.0–8.3)

## 2018-02-18 LAB — CBC
HCT: 40.2 % (ref 39.0–52.0)
HEMOGLOBIN: 13.4 g/dL (ref 13.0–17.0)
MCHC: 33.4 g/dL (ref 30.0–36.0)
MCV: 79.3 fl (ref 78.0–100.0)
PLATELETS: 254 10*3/uL (ref 150.0–400.0)
RBC: 5.07 Mil/uL (ref 4.22–5.81)
RDW: 14.8 % (ref 11.5–15.5)
WBC: 6.5 10*3/uL (ref 4.0–10.5)

## 2018-02-18 MED ORDER — ALPRAZOLAM 0.5 MG PO TABS: ORAL_TABLET | ORAL | 0 refills | Status: DC

## 2018-02-18 NOTE — Progress Notes (Signed)
Pt has been complaining of having "wishy washy" feeling more and more frequently.  When he mentioned this during his walk test, he was in bigeminy.  Once it cleared, he did report feeling better.  He was in and out of bigeminy during the test.  Note sent to cardiologist to be made aware as he has not brought it up before during an appointment.  Fabio PierceJessica Hawkins, MA, ACSM RCEP, CCRP 02/18/2018 3:16 PM    02/18/18 1513  6 Minute Walk  Phase Initial  Distance 1300 feet  Walk Time 6 minutes  # of Rest Breaks 0  MPH 2.46  METS 2.92  RPE 11  VO2 Peak 10.22  Symptoms Yes (comment)  Comments "Wishy washy feeling" during bigeminal PVCs  Resting HR 72 bpm  Resting BP 122/64  Resting Oxygen Saturation  98 %  Exercise Oxygen Saturation  during 6 min walk 98 %  Max Ex. HR 112 bpm  Max Ex. BP 146/74  2 Minute Post BP 132/74

## 2018-02-18 NOTE — Patient Instructions (Signed)
Patient Instructions  Patient Details  Name: Steve Burnett MRN: [id number] Date of Birth: November 07, 1960 Referring Provider:  Marcina Millard, MD  Below are your personal goals for exercise, nutrition, and risk factors. Our goal is to help you stay on track towards obtaining and maintaining these goals. We will be discussing your progress on these goals with you throughout the program.  Initial Exercise Prescription: Initial Exercise Prescription - 02/18/18 1500      Date of Initial Exercise RX and Referring Provider   Date  02/18/18    Referring Provider  Paraschos, Alexander MD Attending Cardiologist: Lorine Bears, MD      Treadmill   MPH  2.5    Grade  0    Minutes  15    METs  2.91      REL-XR   Level  2    Speed  50    Minutes  15    METs  2.9      T5 Nustep   Level  3    SPM  80    Minutes  15    METs  2.9      Prescription Details   Frequency (times per week)  3    Duration  Progress to 45 minutes of aerobic exercise without signs/symptoms of physical distress      Intensity   THRR 40-80% of Max Heartrate  110-147    Ratings of Perceived Exertion  11-13    Perceived Dyspnea  0-4      Progression   Progression  Continue to progress workloads to maintain intensity without signs/symptoms of physical distress.      Resistance Training   Training Prescription  Yes    Weight  4 lbs    Reps  10-15       Exercise Goals: Frequency: Be able to perform aerobic exercise two to three times per week in program working toward 2-5 days per week of home exercise.  Intensity: Work with a perceived exertion of 11 (fairly light) - 15 (hard) while following your exercise prescription.  We will make changes to your prescription with you as you progress through the program.   Duration: Be able to do 30 to 45 minutes of continuous aerobic exercise in addition to a 5 minute warm-up and a 5 minute cool-down routine.   Nutrition Goals: Your personal nutrition goals will  be established when you do your nutrition analysis with the dietician.  The following are general nutrition guidelines to follow: Cholesterol < 200mg /day Sodium < 1500mg /day Fiber: Men over 50 yrs - 30 grams per day  Personal Goals: Personal Goals and Risk Factors at Admission - 02/18/18 1259      Core Components/Risk Factors/Patient Goals on Admission    Weight Management  Yes;Obesity;Weight Loss    Intervention  Weight Management: Develop a combined nutrition and exercise program designed to reach desired caloric intake, while maintaining appropriate intake of nutrient and fiber, sodium and fats, and appropriate energy expenditure required for the weight goal.;Weight Management: Provide education and appropriate resources to help participant work on and attain dietary goals.;Weight Management/Obesity: Establish reasonable short term and long term weight goals.;Obesity: Provide education and appropriate resources to help participant work on and attain dietary goals.    Admit Weight  318 lb 12.8 oz (144.6 kg)    Goal Weight: Short Term  315 lb (142.9 kg)    Goal Weight: Long Term  220 lb (99.8 kg)    Expected Outcomes  Short Term: Continue  to assess and modify interventions until short term weight is achieved;Long Term: Adherence to nutrition and physical activity/exercise program aimed toward attainment of established weight goal;Weight Loss: Understanding of general recommendations for a balanced deficit meal plan, which promotes 1-2 lb weight loss per week and includes a negative energy balance of [phone] kcal/d    Hypertension  Yes    Intervention  Provide education on lifestyle modifcations including regular physical activity/exercise, weight management, moderate sodium restriction and increased consumption of fresh fruit, vegetables, and low fat dairy, alcohol moderation, and smoking cessation.;Monitor prescription use compliance.    Expected Outcomes  Short Term: Continued assessment and  intervention until BP is < 140/8990mm HG in hypertensive participants. < 130/780mm HG in hypertensive participants with diabetes, heart failure or chronic kidney disease.;Long Term: Maintenance of blood pressure at goal levels.    Lipids  Yes    Intervention  Provide education and support for participant on nutrition & aerobic/resistive exercise along with prescribed medications to achieve LDL 70mg , HDL >40mg .    Expected Outcomes  Short Term: Participant states understanding of desired cholesterol values and is compliant with medications prescribed. Participant is following exercise prescription and nutrition guidelines.;Long Term: Cholesterol controlled with medications as prescribed, with individualized exercise RX and with personalized nutrition plan. Value goals: LDL < 70mg , HDL > 40 mg.    Stress  Yes Family stress and chronic illness     Intervention  Offer individual and/or small group education and counseling on adjustment to heart disease, stress management and health-related lifestyle change. Teach and support self-help strategies.;Refer participants experiencing significant psychosocial distress to appropriate mental health specialists for further evaluation and treatment. When possible, include family members and significant others in education/counseling sessions.    Expected Outcomes  Short Term: Participant demonstrates changes in health-related behavior, relaxation and other stress management skills, ability to obtain effective social support, and compliance with psychotropic medications if prescribed.;Long Term: Emotional wellbeing is indicated by absence of clinically significant psychosocial distress or social isolation.       Tobacco Use Initial Evaluation: Social History   Tobacco Use  Smoking Status Never Smoker  Smokeless Tobacco Never Used  Tobacco Comment   Never used tobacco products    Exercise Goals and Review: Exercise Goals    Row Name 02/18/18 1521              Exercise Goals   Increase Physical Activity  Yes       Intervention  Provide advice, education, support and counseling about physical activity/exercise needs.;Develop an individualized exercise prescription for aerobic and resistive training based on initial evaluation findings, risk stratification, comorbidities and participant's personal goals.       Expected Outcomes  Short Term: Attend rehab on a regular basis to increase amount of physical activity.;Long Term: Add in home exercise to make exercise part of routine and to increase amount of physical activity.;Long Term: Exercising regularly at least 3-5 days a week.       Increase Strength and Stamina  Yes       Intervention  Provide advice, education, support and counseling about physical activity/exercise needs.;Develop an individualized exercise prescription for aerobic and resistive training based on initial evaluation findings, risk stratification, comorbidities and participant's personal goals.       Expected Outcomes  Short Term: Increase workloads from initial exercise prescription for resistance, speed, and METs.;Short Term: Perform resistance training exercises routinely during rehab and add in resistance training at home;Long Term: Improve cardiorespiratory fitness, muscular endurance and  strength as measured by increased METs and functional capacity ( )       Able to understand and use rate of perceived exertion (RPE) scale  Yes       Intervention  Provide education and explanation on how to use RPE scale       Expected Outcomes  Short Term: Able to use RPE daily in rehab to express subjective intensity level;Long Term:  Able to use RPE to guide intensity level when exercising independently       Able to understand and use Dyspnea scale  Yes       Intervention  Provide education and explanation on how to use Dyspnea scale       Expected Outcomes  Short Term: Able to use Dyspnea scale daily in rehab to express subjective sense of  shortness of breath during exertion;Long Term: Able to use Dyspnea scale to guide intensity level when exercising independently       Knowledge and understanding of Target Heart Rate Range (THRR)  Yes       Intervention  Provide education and explanation of THRR including how the numbers were predicted and where they are located for reference       Expected Outcomes  Short Term: Able to state/look up THRR;Short Term: Able to use daily as guideline for intensity in rehab;Long Term: Able to use THRR to govern intensity when exercising independently       Able to check pulse independently  Yes       Intervention  Review the importance of being able to check your own pulse for safety during independent exercise;Provide education and demonstration on how to check pulse in carotid and radial arteries.       Expected Outcomes  Short Term: Able to explain why pulse checking is important during independent exercise;Long Term: Able to check pulse independently and accurately       Understanding of Exercise Prescription  Yes       Intervention  Provide education, explanation, and written materials on patient's individual exercise prescription       Expected Outcomes  Short Term: Able to explain program exercise prescription;Long Term: Able to explain home exercise prescription to exercise independently          Copy of goals given to participant.

## 2018-02-18 NOTE — Telephone Encounter (Signed)
[facility] and placed in nurses box.

## 2018-02-18 NOTE — Progress Notes (Signed)
Cardiac Individual Treatment Plan  Patient Details  Name: Steve Burnett MRN: [id number] Date of Birth: Apr 23, 1961 Referring Provider:     Cardiac Rehab from 02/18/2018 in [facility] Cardiac and Pulmonary Rehab  Referring Provider  Isaias Cowman MD [Attending Cardiologist: Kathlyn Sacramento, MD]      Initial Encounter Date:    Cardiac Rehab from 02/18/2018 in [facility] Cardiac and Pulmonary Rehab  Date  02/18/18      Visit Diagnosis: ST elevation myocardial infarction (STEMI), unspecified artery ([place])  Patient's Home Medications on Admission:  Current Outpatient Medications:  .  acetaminophen (TYLENOL) 650 MG CR tablet, Take 650 mg by mouth as needed., Disp: , Rfl:  .  ALPRAZolam (XANAX) 0.5 MG tablet, TAKE ONE TABLET BY MOUTH AT BEDTIME AS NEEDED FOR ANXIETY, Disp: 30 tablet, Rfl: 0 .  aspirin 81 MG chewable tablet, Chew 1 tablet (81 mg total) by mouth daily., Disp: 30 tablet, Rfl: 11 .  carvedilol (COREG) 12.5 MG tablet, Take 1 tablet (12.5 mg total) by mouth 2 (two) times daily with a meal., Disp: 60 tablet, Rfl: 5 .  lisinopril (PRINIVIL,ZESTRIL) 20 MG tablet, Take 1 tablet (20 mg total) by mouth daily., Disp: 30 tablet, Rfl: 5 .  rosuvastatin (CRESTOR) 10 MG tablet, Take 1 tablet (10 mg total) by mouth daily at 6 PM., Disp: 30 tablet, Rfl: 5 .  spironolactone (ALDACTONE) 25 MG tablet, Take 0.5 tablets (12.5 mg total) by mouth daily., Disp: 30 tablet, Rfl: 5 .  ticagrelor (BRILINTA) 90 MG TABS tablet, Take 1 tablet (90 mg total) by mouth 2 (two) times daily., Disp: 180 tablet, Rfl: 3  Past Medical History: Past Medical History:  Diagnosis Date  . Anxiety   . Chicken pox   . Coronary artery disease    a. 02/2012 NSTEMI/Cath/PCI: pLAD 60%, mLAD 80%, dLAD 70, nonobs LCX/RCA --> DES to prox/mid LAD; b. inf STEMI 01/23/18: ost/proxLAD 75% at the prox edge of the previously placed stent, mLAD 75%, OM1 50%, OM2 50%, pRCA 100% s/p PCI/DES, rPLA 70%  . Hyperlipidemia   . Hypertension   .  Ischemic cardiomyopathy    a. echo 7/19: EF 35-40%, HK of the inf wall, Gr1DD, nl sized LA, RVSF nl, PASP nl, challenging image quality  . Obesity   . OSA (obstructive sleep apnea) 03/27/2014  . PAF (paroxysmal atrial fibrillation) ([place])    a. noted during [facility] 01/23/18 during inferior STEMI; b. resolved with IV amiodarone; c. not placed on anticoagulation given brief episode, acute illness, and need for DAPT  . VF (ventricular fibrillation) ([place])    a. noted during [facility] 01/23/2018 s/p defibrillation x 2    Tobacco Use: Social History   Tobacco Use  Smoking Status Never Smoker  Smokeless Tobacco Never Used  Tobacco Comment   Never used tobacco products    Labs: Recent Review Flowsheet Data    Labs for ITP Cardiac and Pulmonary Rehab Latest Ref Rng & Units 03/11/2012 01/15/2014 08/08/2016 01/23/2018   Cholestrol 0 - 200 mg/dL 167 149 142 204(H)   LDLCALC 0 - 99 mg/dL 106(H) 83 - 132(H)   LDLDIRECT mg/dL - - 79.0 -   HDL >40 mg/dL 41 41 37.50(L) 43   Trlycerides <150 mg/dL 98 123 230.0(H) 144   Hemoglobin A1c 4.8 - 5.6 % 6.1 - 6.4 6.0(H)       Exercise Target Goals: Date: 02/18/18  Exercise Program Goal: Individual exercise prescription set using results from initial 6 min walk test and THRR while considering  patient's  activity barriers and safety.   Exercise Prescription Goal: Initial exercise prescription builds to 30-45 minutes a day of aerobic activity, 2-3 days per week.  Home exercise guidelines will be given to patient during program as part of exercise prescription that the participant will acknowledge.  Activity Barriers & Risk Stratification: Activity Barriers & Cardiac Risk Stratification - 02/18/18 1255      Activity Barriers & Cardiac Risk Stratification   Activity Barriers  Arthritis;Muscular Weakness;Shortness of Breath;Deconditioning History of lower disc problems, SOB new since discharge from hospital. New RX Epifania Gore, advised [name] to speak to his MD about this med and  possible side effects, versus other reasons that could cause the SOB    Cardiac Risk Stratification  High       6 Minute Walk: 6 Minute Walk    Row Name 02/18/18 1513         6 Minute Walk   Phase  Initial     Distance  1300 feet     Walk Time  6 minutes     # of Rest Breaks  0     MPH  2.46     METS  2.92     RPE  11     VO2 Peak  10.22     Symptoms  Yes (comment)     Comments  "Wishy washy feeling" during bigeminal PVCs     Resting HR  72 bpm     Resting BP  122/64     Resting Oxygen Saturation   98 %     Exercise Oxygen Saturation  during 6 min walk  98 %     Max Ex. HR  112 bpm     Max Ex. BP  146/74     2 Minute Post BP  132/74        Oxygen Initial Assessment:   Oxygen Re-Evaluation:   Oxygen Discharge (Final Oxygen Re-Evaluation):   Initial Exercise Prescription: Initial Exercise Prescription - 02/18/18 1500      Date of Initial Exercise RX and Referring Provider   Date  02/18/18    Referring Provider  Paraschos, Alexander MD Attending Cardiologist: Kathlyn Sacramento, MD      Treadmill   MPH  2.5    Grade  0    Minutes  15    METs  2.91      REL-XR   Level  2    Speed  50    Minutes  15    METs  2.9      T5 Nustep   Level  3    SPM  80    Minutes  15    METs  2.9      Prescription Details   Frequency (times per week)  3    Duration  Progress to 45 minutes of aerobic exercise without signs/symptoms of physical distress      Intensity   THRR 40-80% of Max Heartrate  110-147    Ratings of Perceived Exertion  11-13    Perceived Dyspnea  0-4      Progression   Progression  Continue to progress workloads to maintain intensity without signs/symptoms of physical distress.      Resistance Training   Training Prescription  Yes    Weight  4 lbs    Reps  10-15       Perform Capillary Blood Glucose checks as needed.  Exercise Prescription Changes:  Exercise Prescription Changes    Row Name 02/18/18 1200  Response to  Exercise   Blood Pressure (Admit)  122/64       Blood Pressure (Exercise)  146/74       Blood Pressure (Exit)  132/74       Heart Rate (Admit)  72 bpm       Heart Rate (Exercise)  102 bpm       Heart Rate (Exit)  72 bpm       Oxygen Saturation (Admit)  98 %       Oxygen Saturation (Exercise)  98 %       Rating of Perceived Exertion (Exercise)  11       Symptoms  bigeminal PVCs, felt "wishy washy"       Comments  walk test results          Exercise Comments:   Exercise Goals and Review:  Exercise Goals    Row Name 02/18/18 1521             Exercise Goals   Increase Physical Activity  Yes       Intervention  Provide advice, education, support and counseling about physical activity/exercise needs.;Develop an individualized exercise prescription for aerobic and resistive training based on initial evaluation findings, risk stratification, comorbidities and participant's personal goals.       Expected Outcomes  Short Term: Attend rehab on a regular basis to increase amount of physical activity.;Long Term: Add in home exercise to make exercise part of routine and to increase amount of physical activity.;Long Term: Exercising regularly at least 3-5 days a week.       Increase Strength and Stamina  Yes       Intervention  Provide advice, education, support and counseling about physical activity/exercise needs.;Develop an individualized exercise prescription for aerobic and resistive training based on initial evaluation findings, risk stratification, comorbidities and participant's personal goals.       Expected Outcomes  Short Term: Increase workloads from initial exercise prescription for resistance, speed, and METs.;Short Term: Perform resistance training exercises routinely during rehab and add in resistance training at home;Long Term: Improve cardiorespiratory fitness, muscular endurance and strength as measured by increased METs and functional capacity (6MWT)       Able to understand  and use rate of perceived exertion (RPE) scale  Yes       Intervention  Provide education and explanation on how to use RPE scale       Expected Outcomes  Short Term: Able to use RPE daily in rehab to express subjective intensity level;Long Term:  Able to use RPE to guide intensity level when exercising independently       Able to understand and use Dyspnea scale  Yes       Intervention  Provide education and explanation on how to use Dyspnea scale       Expected Outcomes  Short Term: Able to use Dyspnea scale daily in rehab to express subjective sense of shortness of breath during exertion;Long Term: Able to use Dyspnea scale to guide intensity level when exercising independently       Knowledge and understanding of Target Heart Rate Range (THRR)  Yes       Intervention  Provide education and explanation of THRR including how the numbers were predicted and where they are located for reference       Expected Outcomes  Short Term: Able to state/look up THRR;Short Term: Able to use daily as guideline for intensity in rehab;Long Term: Able to use THRR to govern intensity  when exercising independently       Able to check pulse independently  Yes       Intervention  Review the importance of being able to check your own pulse for safety during independent exercise;Provide education and demonstration on how to check pulse in carotid and radial arteries.       Expected Outcomes  Short Term: Able to explain why pulse checking is important during independent exercise;Long Term: Able to check pulse independently and accurately       Understanding of Exercise Prescription  Yes       Intervention  Provide education, explanation, and written materials on patient's individual exercise prescription       Expected Outcomes  Short Term: Able to explain program exercise prescription;Long Term: Able to explain home exercise prescription to exercise independently          Exercise Goals Re-Evaluation :   Discharge  Exercise Prescription (Final Exercise Prescription Changes): Exercise Prescription Changes - 02/18/18 1200      Response to Exercise   Blood Pressure (Admit)  122/64    Blood Pressure (Exercise)  146/74    Blood Pressure (Exit)  132/74    Heart Rate (Admit)  72 bpm    Heart Rate (Exercise)  102 bpm    Heart Rate (Exit)  72 bpm    Oxygen Saturation (Admit)  98 %    Oxygen Saturation (Exercise)  98 %    Rating of Perceived Exertion (Exercise)  11    Symptoms  bigeminal PVCs, felt "wishy washy"    Comments  walk test results       Nutrition:  Target Goals: Understanding of nutrition guidelines, daily intake of sodium <1557m, cholesterol <2054m calories 30% from fat and 7% or less from saturated fats, daily to have 5 or more servings of fruits and vegetables.  Biometrics: Pre Biometrics - 02/18/18 1521      Pre Biometrics   Height  5' 10.2" (1.783 m)    Weight  318 lb 14.4 oz (144.7 kg)  (Abnormal)     Waist Circumference  47 inches    Hip Circumference  51 inches    Waist to Hip Ratio  0.92 %    BMI (Calculated)  45.5    Single Leg Stand  24.52 seconds        Nutrition Therapy Plan and Nutrition Goals: Nutrition Therapy & Goals - 02/18/18 1258      Intervention Plan   Intervention  Prescribe, educate and counsel regarding individualized specific dietary modifications aiming towards targeted core components such as weight, hypertension, lipid management, diabetes, heart failure and other comorbidities.    Expected Outcomes  Short Term Goal: Understand basic principles of dietary content, such as calories, fat, sodium, cholesterol and nutrients.;Short Term Goal: A plan has been developed with personal nutrition goals set during dietitian appointment.;Long Term Goal: Adherence to prescribed nutrition plan.       Nutrition Assessments:   Nutrition Goals Re-Evaluation:   Nutrition Goals Discharge (Final Nutrition Goals Re-Evaluation):   Psychosocial: Target Goals:  Acknowledge presence or absence of significant depression and/or stress, maximize coping skills, provide positive support system. Participant is able to verbalize types and ability to use techniques and skills needed for reducing stress and depression.   Initial Review & Psychosocial Screening: Initial Psych Review & Screening - 02/18/18 1308      Initial Review   Current issues with  Current Anxiety/Panic;Current Sleep Concerns;Current Stress Concerns    Source of Stress  Concerns  Chronic Illness;Financial;Family      Family Dynamics   Good Support System?  Yes Wife (newly weds) and self    Comments  Stress of his son not getting along with his new step mother. Stress with children not caring about his needs.   Stress with new SOB symptoms,which are hindering his ability to move on. Sleep concerns: unable to fall asleep because thoughts about death and dying keep him from sleeping.  MD aware and he is prescribed Xnanx for this and it does help him get to sleep.      Barriers   Psychosocial barriers to participate in program  There are no identifiable barriers or psychosocial needs.;The patient should benefit from training in stress management and relaxation.      Screening Interventions   Interventions  Encouraged to exercise;Program counselor consult;To provide support and resources with identified psychosocial needs;Provide feedback about the scores to participant    Expected Outcomes  Short Term goal: Utilizing psychosocial counselor, staff and physician to assist with identification of specific Stressors or current issues interfering with healing process. Setting desired goal for each stressor or current issue identified.;Long Term Goal: Stressors or current issues are controlled or eliminated.;Short Term goal: Identification and review with participant of any Quality of Life or Depression concerns found by scoring the questionnaire.;Long Term goal: The participant improves quality of Life and  PHQ9 Scores as seen by post scores and/or verbalization of changes       Quality of Life Scores:  Quality of Life - 02/18/18 1315      Quality of Life   Select  Quality of Life      Quality of Life Scores   Health/Function Pre  14.27 %    Socioeconomic Pre  15.5 %    Psych/Spiritual Pre  12.86 %    Family Pre  16.7 %    GLOBAL Pre  14.59 %      Scores of 19 and below usually indicate a poorer quality of life in these areas.  A difference of  2-3 points is a clinically meaningful difference.  A difference of 2-3 points in the total score of the Quality of Life Index has been associated with significant improvement in overall quality of life, self-image, physical symptoms, and general health in studies assessing change in quality of life.  PHQ-9: Recent Review Flowsheet Data    Depression screen [facility] 2/9 02/18/2018 08/10/2016 03/03/2014   Decreased Interest 0 0 0   Down, Depressed, Hopeless 1 0 0   PHQ - 2 Score 1 0 0   Altered sleeping 0 - -   Tired, decreased energy 2 - -   Change in appetite 0 - -   Feeling bad or failure about yourself  2 - -   Trouble concentrating 1 - -   Moving slowly or fidgety/restless 0 - -   Suicidal thoughts 0 - -   PHQ-9 Score 6 - -   Difficult doing work/chores Somewhat difficult - -     Interpretation of Total Score  Total Score Depression Severity:  1-4 = Minimal depression, 5-9 = Mild depression, 10-14 = Moderate depression, 15-19 = Moderately severe depression, 20-27 = Severe depression   Psychosocial Evaluation and Intervention:   Psychosocial Re-Evaluation:   Psychosocial Discharge (Final Psychosocial Re-Evaluation):   Vocational Rehabilitation: Provide vocational rehab assistance to qualifying candidates.   Vocational Rehab Evaluation & Intervention: Vocational Rehab - 02/18/18 1321      Initial Vocational Rehab Evaluation &  Intervention   Assessment shows need for Vocational Rehabilitation  No       Education: Education  Goals: Education classes will be provided on a variety of topics geared toward better understanding of heart health and risk factor modification. Participant will state understanding/return demonstration of topics presented as noted by education test scores.  Learning Barriers/Preferences: Learning Barriers/Preferences - 02/18/18 1320      Learning Barriers/Preferences   Learning Barriers  None    Learning Preferences  None       Education Topics:  AED/CPR: - Group verbal and written instruction with the use of models to demonstrate the basic use of the AED with the basic ABC's of resuscitation.   General Nutrition Guidelines/Fats and Fiber: -Group instruction provided by verbal, written material, models and posters to present the general guidelines for heart healthy nutrition. Gives an explanation and review of dietary fats and fiber.   Controlling Sodium/Reading Food Labels: -Group verbal and written material supporting the discussion of sodium use in heart healthy nutrition. Review and explanation with models, verbal and written materials for utilization of the food label.   Exercise Physiology & General Exercise Guidelines: - Group verbal and written instruction with models to review the exercise physiology of the cardiovascular system and associated critical values. Provides general exercise guidelines with specific guidelines to those with heart or lung disease.    Aerobic Exercise & Resistance Training: - Gives group verbal and written instruction on the various components of exercise. Focuses on aerobic and resistive training programs and the benefits of this training and how to safely progress through these programs..   Flexibility, Balance, Mind/Body Relaxation: Provides group verbal/written instruction on the benefits of flexibility and balance training, including mind/body exercise modes such as yoga, pilates and tai chi.  Demonstration and skill practice  provided.   Stress and Anxiety: - Provides group verbal and written instruction about the health risks of elevated stress and causes of high stress.  Discuss the correlation between heart/lung disease and anxiety and treatment options. Review healthy ways to manage with stress and anxiety.   Depression: - Provides group verbal and written instruction on the correlation between heart/lung disease and depressed mood, treatment options, and the stigmas associated with seeking treatment.   Anatomy & Physiology of the Heart: - Group verbal and written instruction and models provide basic cardiac anatomy and physiology, with the coronary electrical and arterial systems. Review of Valvular disease and Heart Failure   Cardiac Procedures: - Group verbal and written instruction to review commonly prescribed medications for heart disease. Reviews the medication, class of the drug, and side effects. Includes the steps to properly store meds and maintain the prescription regimen. (beta blockers and nitrates)   Cardiac Medications I: - Group verbal and written instruction to review commonly prescribed medications for heart disease. Reviews the medication, class of the drug, and side effects. Includes the steps to properly store meds and maintain the prescription regimen.   Cardiac Medications II: -Group verbal and written instruction to review commonly prescribed medications for heart disease. Reviews the medication, class of the drug, and side effects. (all other drug classes)    Go Sex-Intimacy & Heart Disease, Get SMART - Goal Setting: - Group verbal and written instruction through game format to discuss heart disease and the return to sexual intimacy. Provides group verbal and written material to discuss and apply goal setting through the application of the S.M.A.R.T. Method.   Other Matters of the Heart: - Provides group verbal,  written materials and models to describe Stable Angina and  Peripheral Artery. Includes description of the disease process and treatment options available to the cardiac patient.   Exercise & Equipment Safety: - Individual verbal instruction and demonstration of equipment use and safety with use of the equipment.   Cardiac Rehab from 02/18/2018 in [facility] Cardiac and Pulmonary Rehab  Date  02/18/18  Educator  SB  Instruction Review Code  1- Verbalizes Understanding      Infection Prevention: - Provides verbal and written material to individual with discussion of infection control including proper hand washing and proper equipment cleaning during exercise session.   Cardiac Rehab from 02/18/2018 in [facility] Cardiac and Pulmonary Rehab  Date  02/18/18  Educator  SB  Instruction Review Code  1- Verbalizes Understanding      Falls Prevention: - Provides verbal and written material to individual with discussion of falls prevention and safety.   Cardiac Rehab from 02/18/2018 in [facility] Cardiac and Pulmonary Rehab  Date  02/18/18  Educator  SB  Instruction Review Code  1- Verbalizes Understanding      Diabetes: - Individual verbal and written instruction to review signs/symptoms of diabetes, desired ranges of glucose level fasting, after meals and with exercise. Acknowledge that pre and post exercise glucose checks will be done for 3 sessions at entry of program.   Know Your Numbers and Risk Factors: -Group verbal and written instruction about important numbers in your health.  Discussion of what are risk factors and how they play a role in the disease process.  Review of Cholesterol, Blood Pressure, Diabetes, and BMI and the role they play in your overall health.   Sleep Hygiene: -Provides group verbal and written instruction about how sleep can affect your health.  Define sleep hygiene, discuss sleep cycles and impact of sleep habits. Review good sleep hygiene tips.    Other: -Provides group and verbal instruction on various topics (see  comments)   Knowledge Questionnaire Score: Knowledge Questionnaire Score - 02/18/18 1320      Knowledge Questionnaire Score   Pre Score  21/26 Reviewed correct responses with [name] today.He verbalized understanding of the responses and had no further questions.       Core Components/Risk Factors/Patient Goals at Admission: Personal Goals and Risk Factors at Admission - 02/18/18 1259      Core Components/Risk Factors/Patient Goals on Admission    Weight Management  Yes;Obesity;Weight Loss    Intervention  Weight Management: Develop a combined nutrition and exercise program designed to reach desired caloric intake, while maintaining appropriate intake of nutrient and fiber, sodium and fats, and appropriate energy expenditure required for the weight goal.;Weight Management: Provide education and appropriate resources to help participant work on and attain dietary goals.;Weight Management/Obesity: Establish reasonable short term and long term weight goals.;Obesity: Provide education and appropriate resources to help participant work on and attain dietary goals.    Admit Weight  318 lb 12.8 oz (144.6 kg)    Goal Weight: Short Term  315 lb (142.9 kg)    Goal Weight: Long Term  220 lb (99.8 kg)    Expected Outcomes  Short Term: Continue to assess and modify interventions until short term weight is achieved;Long Term: Adherence to nutrition and physical activity/exercise program aimed toward attainment of established weight goal;Weight Loss: Understanding of general recommendations for a balanced deficit meal plan, which promotes 1-2 lb weight loss per week and includes a negative energy balance of [phone] kcal/d    Hypertension  Yes  Intervention  Provide education on lifestyle modifcations including regular physical activity/exercise, weight management, moderate sodium restriction and increased consumption of fresh fruit, vegetables, and low fat dairy, alcohol moderation, and smoking  cessation.;Monitor prescription use compliance.    Expected Outcomes  Short Term: Continued assessment and intervention until BP is < 140/79m HG in hypertensive participants. < 130/828mHG in hypertensive participants with diabetes, heart failure or chronic kidney disease.;Long Term: Maintenance of blood pressure at goal levels.    Lipids  Yes    Intervention  Provide education and support for participant on nutrition & aerobic/resistive exercise along with prescribed medications to achieve LDL <7062mHDL >53m64m  Expected Outcomes  Short Term: Participant states understanding of desired cholesterol values and is compliant with medications prescribed. Participant is following exercise prescription and nutrition guidelines.;Long Term: Cholesterol controlled with medications as prescribed, with individualized exercise RX and with personalized nutrition plan. Value goals: LDL < 70mg93mL > 40 mg.    Stress  Yes Family stress and chronic illness     Intervention  Offer individual and/or small group education and counseling on adjustment to heart disease, stress management and health-related lifestyle change. Teach and support self-help strategies.;Refer participants experiencing significant psychosocial distress to appropriate mental health specialists for further evaluation and treatment. When possible, include family members and significant others in education/counseling sessions.    Expected Outcomes  Short Term: Participant demonstrates changes in health-related behavior, relaxation and other stress management skills, ability to obtain effective social support, and compliance with psychotropic medications if prescribed.;Long Term: Emotional wellbeing is indicated by absence of clinically significant psychosocial distress or social isolation.       Core Components/Risk Factors/Patient Goals Review:    Core Components/Risk Factors/Patient Goals at Discharge (Final Review):    ITP Comments: ITP  Comments    Row Name 02/18/18 1253 02/18/18 1523         ITP Comments  Medical review completed today. ITP sent to Dr M MilLoleta Chancereview,changes as needed and signature.   [facility]  Pt has been complaining of having "wishy washy" feeling more and more frequently.  When he mentioned this during his walk test, he was in bigeminy.  Once it cleared, he did report feeling better.  He was in and out of bigeminy during the test.  Note sent to cardiologist to be made aware as he has not brought it up before during an appointment.          Comments: Initial ITP

## 2018-02-18 NOTE — Patient Instructions (Signed)
Heart Attack A heart attack (myocardial infarction, MI) causes damage to the heart that cannot be fixed. A heart attack often happens when a blood clot or other blockage cuts blood flow to the heart. When this happens, certain areas of the heart begin to die. This causes the pain you feel during a heart attack. Follow these instructions at home:  Take medicine as told by your doctor. You may need medicine to: ? Keep your blood from clotting too easily. ? Control your blood pressure. ? Lower your cholesterol. ? Control abnormal heart rhythms.  Change certain behaviors as told by your doctor. This may include: ? Quitting smoking. ? Being active. ? Eating a heart-healthy diet. Ask your doctor for help with this diet. ? Keeping a healthy weight. ? Keeping your diabetes under control. ? Lessening stress. ? Limiting how much alcohol you drink. Do not take these medicines unless your doctor says that you can:  Nonsteroidal anti-inflammatory drugs (NSAIDs). These include: ? Ibuprofen. ? Naproxen. ? Celecoxib.  Vitamin supplements that have vitamin A, vitamin E, or both.  Hormone therapy that contains estrogen with or without progestin.  Get help right away if:  You have sudden chest discomfort.  You have sudden discomfort in your: ? Arms. ? Back. ? Neck. ? Jaw.  You have shortness of breath at any time.  You have sudden sweating or clammy skin.  You feel sick to your stomach (nauseous) or throw up (vomit).  You suddenly get light-headed or dizzy.  You feel your heart beating fast or skipping beats. These symptoms may be an emergency. Do not wait to see if the symptoms will go away. Get medical help right away. Call your local emergency services (911 in the [place] Do not drive yourself to the hospital. This information is not intended to replace advice given to you by your health care provider. Make sure you discuss any questions you have with your health care  provider. Document Released: 01/09/2012 Document Revised: 12/16/2015 Document Reviewed: 09/12/2013 Elsevier Interactive Patient Education  2017 Elsevier Inc.  

## 2018-02-18 NOTE — Progress Notes (Signed)
Subjective:    Patient ID: Steve Burnett, male    DOB: 1960/09/15, 57 y.o.   MRN: [id number]  HPI  Pt presents to the clinic today for hospital follow. He went to the ER 7/3 with c/p chest pain. ECG was concerning for STEMI. Troponin maxed at out 18.3. Echo showed EF of 30-45%, with grade 1 diastolic dysfunction. He was emergently taken to the cath lab, which showed 100 % RCA stenosis. He was stented successfully, although the procedure was complicated by 2 episodes of Vfib (with successful defibrillation) and 1 episode of Afib. He was placed on a Diltiazem drip which was eventually discontinued. He was started on ASA, Brillinta, Carvedilol, Rosuvastatin, Lisinopril and Spironolactone. He was discharged home on 7/5. Since discharge, he denies any chest pain or shortness of breath. He has already followed up with Eula Listen, PA on 7/16- note reviewed. He has a stress test scheduled in the next few weeks.   He feels like he is having a lot of muscle pain, especially in the left chest. He describes the pain as tender to touch and worse with movement. He was having this pain prior to his recent heart attack. The pain does not radiate. He is also getting cramping of the right upper thigh. He reports he had a cath in the right femoral area, but not sure if the thigh pain is related. He describes the pain as twitching. He denies numbness, tingling or weakness. He reports changing position helps. He has not tried any OTC meds.  He also wants to follow up on the anxiety. He stopped taking his Celexa 3 months prior to recent hospitalization. He doesn't want to start taking the Celexa again, because he was still having breakthrough anxiety, especially when he is going to bed, he feels like he starts to panic. He is not sure what triggers this. He does wear a CPAP at night, and is not uncomfortable. He takes the Xanax every night before bed with good relief of symptoms.   Review of Systems  Past Medical  History:  Diagnosis Date  . Anxiety   . Chicken pox   . Coronary artery disease    a. 02/2012 NSTEMI/Cath/PCI: pLAD 60%, mLAD 80%, dLAD 70, nonobs LCX/RCA --> DES to prox/mid LAD; b. inf STEMI 01/23/18: ost/proxLAD 75% at the prox edge of the previously placed stent, mLAD 75%, OM1 50%, OM2 50%, pRCA 100% s/p PCI/DES, rPLA 70%  . Hyperlipidemia   . Hypertension   . Ischemic cardiomyopathy    a. echo 7/19: EF 35-40%, HK of the inf wall, Gr1DD, nl sized LA, RVSF nl, PASP nl, challenging image quality  . Obesity   . OSA (obstructive sleep apnea) 03/27/2014  . PAF (paroxysmal atrial fibrillation) (HCC)    a. noted [facility] 01/23/18 during inferior STEMI; b. resolved with IV amiodarone; c. not placed on anticoagulation given brief episode, acute illness, and need for DAPT  . VF (ventricular fibrillation) (HCC)    a. noted during [facility] 01/23/2018 s/p defibrillation x 2    Current Outpatient Medications  Medication Sig Dispense Refill  . acetaminophen (TYLENOL) 650 MG CR tablet Take 650 mg by mouth as needed.    . ALPRAZolam (XANAX) 0.5 MG tablet TAKE ONE TABLET BY MOUTH AT BEDTIME AS NEEDED FOR ANXIETY 20 tablet 0  . aspirin 81 MG chewable tablet Chew 1 tablet (81 mg total) by mouth daily. 30 tablet 11  . carvedilol (COREG) 12.5 MG tablet Take 1 tablet (12.5 mg total)  by mouth 2 (two) times daily with a meal. 60 tablet 5  . lisinopril (PRINIVIL,ZESTRIL) 20 MG tablet Take 1 tablet (20 mg total) by mouth daily. 30 tablet 5  . rosuvastatin (CRESTOR) 10 MG tablet Take 1 tablet (10 mg total) by mouth daily at 6 PM. 30 tablet 5  . spironolactone (ALDACTONE) 25 MG tablet Take 0.5 tablets (12.5 mg total) by mouth daily. 30 tablet 5  . ticagrelor (BRILINTA) 90 MG TABS tablet Take 1 tablet (90 mg total) by mouth 2 (two) times daily. 180 tablet 3   No current facility-administered medications for this visit.     Allergies  Allergen Reactions  . Atorvastatin Other (See Comments)    Muscle cramps Muscle  cramps    Family History  Problem Relation Age of Onset  . Heart disease Mother   . Hyperlipidemia Mother   . Hypertension Mother   . Heart attack Mother 58       CABG X 4; past at age 89  . Heart disease Sister 57       s/p stents   . Stroke Father   . Cancer Neg Hx   . Diabetes Neg Hx     Social History   Socioeconomic History  . Marital status: Widowed    Spouse name: Not on file  . Number of children: Not on file  . Years of education: Not on file  . Highest education level: Not on file  Occupational History  . Occupation: Tourist information centre manager: [employer]  Social Needs  . Financial resource strain: Not on file  . Food insecurity:    Worry: Not on file    Inability: Not on file  . Transportation needs:    Medical: Not on file    Non-medical: Not on file  Tobacco Use  . Smoking status: Never Smoker  . Smokeless tobacco: Never Used  Substance and Sexual Activity  . Alcohol use: Yes    Alcohol/week: 1.2 oz    Types: 2 Cans of beer per week    Comment: biweekly 1-2 beers  . Drug use: No  . Sexual activity: Not Currently  Lifestyle  . Physical activity:    Days per week: Not on file    Minutes per session: Not on file  . Stress: Not on file  Relationships  . Social connections:    Talks on phone: Not on file    Gets together: Not on file    Attends religious service: Not on file    Active member of club or organization: Not on file    Attends meetings of clubs or organizations: Not on file    Relationship status: Not on file  . Intimate partner violence:    Fear of current or ex partner: Not on file    Emotionally abused: Not on file    Physically abused: Not on file    Forced sexual activity: Not on file  Other Topics Concern  . Not on file  Social History Narrative   Lives locally with his son, who has a h/o liver failure s/p prior liver transplant.  He is pending repeat transplant.     Constitutional: Denies fever, malaise, fatigue,  headache or abrupt weight changes.  Respiratory: Denies difficulty breathing, shortness of breath, cough or sputum production.   Cardiovascular: Denies chest pain, chest tightness, palpitations or swelling in the hands or feet.  Musculoskeletal: Pt reports muscle pain and cramping. Denies decrease in range of motion, difficulty with  gait, or joint pain and swelling.  Skin: Denies redness, rashes, lesions or ulcercations.  Psych: Pt reports anxiety. Denies depression, SI/HI.  No other specific complaints in a complete review of systems (except as listed in HPI above).     Objective:   Physical Exam   BP 126/86   Pulse 81   Temp 98.4 F (36.9 C) (Oral)   Wt (!) 319 lb (144.7 kg)   SpO2 97%   BMI 45.77 kg/m  Wt Readings from Last 3 Encounters:  02/18/18 (!) 319 lb (144.7 kg)  02/05/18 (!) 310 lb 4 oz (140.7 kg)  01/24/18 (!) 309 lb 6.4 oz (140.3 kg)    General: Appears his stated age, obese in NAD. Skin: Warm, dry and intact. No rashes noted. Cardiovascular: Normal rate and rhythm. S1,S2 noted.  No murmur, rubs or gallops noted. No JVD or BLE edema. No carotid bruits noted. Pulmonary/Chest: Normal effort and positive vesicular breath sounds. No respiratory distress. No wheezes, rales or ronchi noted.  Musculoskeletal: Left chest wall tender to palpation. Strength 5/5 BUE/BLE. No signs of joint swelling. No difficulty with gait.  Neurological: Alert and oriented.   Psychiatric: Mood and affect normal. Behavior is normal. Judgment and thought content normal.     BMET    Component Value Date/Time   NA 138 01/24/2018 0814   NA 138 02/02/2014 0718   K 3.9 01/24/2018 0814   K 3.9 02/02/2014 0718   CL 106 01/24/2018 0814   CL 105 02/02/2014 0718   CO2 25 01/24/2018 0814   CO2 25 02/02/2014 0718   GLUCOSE 122 (H) 01/24/2018 0814   GLUCOSE 114 (H) 02/02/2014 0718   BUN 11 01/24/2018 0814   BUN 16 02/02/2014 0718   CREATININE 0.39 (L) 01/24/2018 0814   CREATININE 0.83  02/02/2014 0718   CALCIUM 8.6 (L) 01/24/2018 0814   CALCIUM 8.5 02/02/2014 0718   GFRNONAA >60 01/24/2018 0814   GFRNONAA >60 02/02/2014 0718   GFRAA >60 01/24/2018 0814   GFRAA >60 02/02/2014 0718    Lipid Panel     Component Value Date/Time   CHOL 204 (H) 01/23/2018 0847   CHOL 149 01/15/2014 0826   CHOL 167 03/11/2012 0645   TRIG 144 01/23/2018 0847   TRIG 98 03/11/2012 0645   HDL 43 01/23/2018 0847   HDL 41 01/15/2014 0826   HDL 41 03/11/2012 0645   CHOLHDL 4.7 01/23/2018 0847   VLDL 29 01/23/2018 0847   VLDL 20 03/11/2012 0645   LDLCALC 132 (H) 01/23/2018 0847   LDLCALC 83 01/15/2014 0826   LDLCALC 106 (H) 03/11/2012 0645    CBC    Component Value Date/Time   WBC 12.3 (H) 01/24/2018 0814   RBC 5.39 01/24/2018 0814   HGB 14.1 01/24/2018 0814   HGB 13.4 02/02/2014 0718   HCT 42.8 01/24/2018 0814   HCT 41.1 02/02/2014 0718   PLT 275 01/24/2018 0814   PLT 233 02/02/2014 0718   MCV 79.4 (L) 01/24/2018 0814   MCV 80 02/02/2014 0718   MCH 26.2 01/24/2018 0814   MCHC 33.0 01/24/2018 0814   RDW 15.8 (H) 01/24/2018 0814   RDW 15.7 (H) 02/02/2014 0718   LYMPHSABS 2.0 01/23/2018 0847   LYMPHSABS 0.9 (L) 03/10/2012 1240   MONOABS 0.5 01/23/2018 0847   MONOABS 0.4 03/10/2012 1240   EOSABS 0.2 01/23/2018 0847   EOSABS 0.0 03/10/2012 1240   BASOSABS 0.1 01/23/2018 0847   BASOSABS 0.0 03/10/2012 1240    Hgb A1C Lab  Results  Component Value Date   HGBA1C 6.0 (H) 01/23/2018           Assessment & Plan:   Hospital Follow up for STEMI, HTN, HLD, CHF-Diastolic, Muscle Cramps, Anxiety:  Hospital notes, labs and imaging reviewed No med changes at this time CBC and CMET today He will follow up with cardiology in a few weeks as scheduled He is participating in cardiac rehab Encouraged balanced diet, adequate water intake, exercise for weight loss Will d/c Celexa Continue Xanax at [facility]- refilled today  Return precautions discussed Nicki Reaperegina Jedediah Noda, NP

## 2018-02-19 NOTE — Telephone Encounter (Signed)
Forms placed on Ryan's desk to review.

## 2018-02-20 NOTE — Telephone Encounter (Signed)
Placed completed forms interoffice to [employer] 

## 2018-02-22 ENCOUNTER — Telehealth: Payer: Self-pay | Admitting: *Deleted

## 2018-02-22 ENCOUNTER — Encounter: Payer: BLUE CROSS/BLUE SHIELD | Attending: Cardiology

## 2018-02-22 DIAGNOSIS — I213 ST elevation (STEMI) myocardial infarction of unspecified site: Secondary | ICD-10-CM | POA: Insufficient documentation

## 2018-02-22 DIAGNOSIS — I493 Ventricular premature depolarization: Secondary | ICD-10-CM

## 2018-02-22 NOTE — Telephone Encounter (Signed)
Call placed to the patient. Per Dr. Kirke CorinArida, a 24 Holter monitor has been ordered. Scheduling will be made aware.  The patient stated that he has been having some shortness of breath and chest discomfort since starting the Brilinta. The discomfort is only in the morning when he gets out of bed. He did state that the discomfort feels more like it is muscle related and not cardiac related. He did state that it is getting better. He has been advised to call if it does not get better. He verbalized his understanding.

## 2018-02-25 ENCOUNTER — Encounter: Payer: BLUE CROSS/BLUE SHIELD | Admitting: *Deleted

## 2018-02-25 DIAGNOSIS — I213 ST elevation (STEMI) myocardial infarction of unspecified site: Secondary | ICD-10-CM | POA: Diagnosis not present

## 2018-02-25 NOTE — Progress Notes (Signed)
Daily Session Note  Patient Details  Name: Steve Burnett MRN: [id number] Date of Birth: [birth year] Referring Provider:     Cardiac Rehab from 02/18/2018 in [facility] Cardiac and Pulmonary Rehab  Referring Provider  Isaias Cowman MD [Attending Cardiologist: Kathlyn Sacramento, MD]      Encounter Date: 02/25/2018  Check In: Session Check In - 02/25/18 0752      Check-In   Supervising physician immediately available to respond to emergencies  See telemetry face sheet for immediately available ER MD    Location  [facility]-Cardiac & Pulmonary Rehab    Staff Present  Alberteen Sam, MA, RCEP, CCRP, Exercise Physiologist;Maven Rosander Amedeo Plenty, BS, ACSM CEP, Exercise Physiologist;Susanne Bice, RN, BSN, CCRP    Medication changes reported      No    Fall or balance concerns reported     No    Tobacco Cessation  No Change    Warm-up and Cool-down  Performed on first and last piece of equipment    Resistance Training Performed  Yes    VAD Patient?  No    PAD/SET Patient?  No      Pain Assessment   Currently in Pain?  No/denies    Multiple Pain Sites  No          Social History   Tobacco Use  Smoking Status Never Smoker  Smokeless Tobacco Never Used  Tobacco Comment   Never used tobacco products    Goals Met:  Exercise tolerated well Personal goals reviewed No report of cardiac concerns or symptoms Strength training completed today  Goals Unmet:  Not Applicable  Comments: First full day of exercise!  Patient was oriented to gym and equipment including functions, settings, policies, and procedures.  Patient's individual exercise prescription and treatment plan were reviewed.  All starting workloads were established based on the results of the 6 minute walk test done at initial orientation visit.  The plan for exercise progression was also introduced and progression will be customized based on patient's performance and goals.     Dr. Emily Filbert is Medical Director for [place] and [facility].

## 2018-02-26 ENCOUNTER — Encounter
Admission: RE | Admit: 2018-02-26 | Discharge: 2018-02-26 | Disposition: A | Discharge status: Home / Self Care | Payer: BLUE CROSS/BLUE SHIELD | Source: Ambulatory Visit | Attending: Physician Assistant | Admitting: Physician Assistant

## 2018-02-26 DIAGNOSIS — I255 Ischemic cardiomyopathy: Secondary | ICD-10-CM | POA: Diagnosis not present

## 2018-02-26 DIAGNOSIS — I2119 ST elevation (STEMI) myocardial infarction involving other coronary artery of inferior wall: Secondary | ICD-10-CM | POA: Diagnosis not present

## 2018-02-26 DIAGNOSIS — I10 Essential (primary) hypertension: Secondary | ICD-10-CM

## 2018-02-26 MED ORDER — REGADENOSON 0.4 MG/5ML IV SOLN
0.4000 mg | Freq: Once | INTRAVENOUS | Status: AC
Start: 2018-02-26 — End: 2018-02-26
  Administered 2018-02-26: 0.4 mg via INTRAVENOUS
  Filled 2018-02-26: qty 5

## 2018-02-26 MED ORDER — TECHNETIUM TC 99M TETROFOSMIN IV KIT
30.0000 | PACK | Freq: Once | INTRAVENOUS | Status: AC | PRN
  Administered 2018-02-26: 28.9 via INTRAVENOUS

## 2018-02-27 ENCOUNTER — Ambulatory Visit (INDEPENDENT_AMBULATORY_CARE_PROVIDER_SITE_OTHER): Payer: BLUE CROSS/BLUE SHIELD

## 2018-02-27 ENCOUNTER — Encounter
Admission: RE | Admit: 2018-02-27 | Discharge: 2018-02-27 | Disposition: A | Discharge status: Home / Self Care | Payer: BLUE CROSS/BLUE SHIELD | Source: Ambulatory Visit | Attending: Physician Assistant | Admitting: Physician Assistant

## 2018-02-27 DIAGNOSIS — I493 Ventricular premature depolarization: Secondary | ICD-10-CM

## 2018-02-27 MED ORDER — TECHNETIUM TC 99M TETROFOSMIN IV KIT
30.0000 | PACK | Freq: Once | INTRAVENOUS | Status: AC | PRN
  Administered 2018-02-27: 32.15 via INTRAVENOUS

## 2018-02-27 MED ORDER — REGADENOSON 0.4 MG/5ML IV SOLN
0.4000 mg | Freq: Once | INTRAVENOUS | Status: DC
  Filled 2018-02-27: qty 5

## 2018-03-01 ENCOUNTER — Telehealth: Payer: Self-pay | Admitting: *Deleted

## 2018-03-01 ENCOUNTER — Ambulatory Visit
Admission: RE | Admit: 2018-03-01 | Discharge: 2018-03-01 | Disposition: A | Discharge status: Home / Self Care | Payer: BLUE CROSS/BLUE SHIELD | Source: Ambulatory Visit | Attending: Cardiovascular Disease | Admitting: Cardiovascular Disease

## 2018-03-01 DIAGNOSIS — I493 Ventricular premature depolarization: Secondary | ICD-10-CM | POA: Diagnosis not present

## 2018-03-01 DIAGNOSIS — I213 ST elevation (STEMI) myocardial infarction of unspecified site: Secondary | ICD-10-CM | POA: Diagnosis not present

## 2018-03-01 LAB — NM MYOCAR MULTI W/SPECT W/WALL MOTION / EF
CHL CUP NUCLEAR SDS: 0
CHL CUP NUCLEAR SRS: 8
CHL CUP RESTING HR STRESS: 71 {beats}/min
LVDIAVOL: 229 mL (ref 62–150)
LVSYSVOL: 123 mL
Peak HR: 90 {beats}/min
Percent HR: 54 %
SSS: 0
TID: 0.92

## 2018-03-01 MED ORDER — LISINOPRIL 10 MG PO TABS: 10.0000 mg | ORAL_TABLET | Freq: Every day | ORAL | Status: DC

## 2018-03-01 MED ORDER — CARVEDILOL 25 MG PO TABS: 25.0000 mg | ORAL_TABLET | Freq: Two times a day (BID) | ORAL | 3 refills | Status: DC

## 2018-03-01 NOTE — Telephone Encounter (Signed)
Patient made aware of results and verbalized understanding. Medication has been changed in the chart.   Patient has been experiencing shortness of breath occasionally. He feels like it may be the PVC's or Brilinta. He stated that he wants to try and stay on the Brilinta for now and see if the medication changes help with the PVC's. He will call back on Monday with an update.

## 2018-03-01 NOTE — Progress Notes (Signed)
Daily Session Note  Patient Details  Name: Alex Leahy MRN: [id number] Date of Birth: [birth year] Referring Provider:     Cardiac Rehab from 02/18/2018 in [facility] Cardiac and Pulmonary Rehab  Referring Provider  Isaias Cowman MD [Attending Cardiologist: Kathlyn Sacramento, MD]      Encounter Date: 03/01/2018  Check In:      Social History   Tobacco Use  Smoking Status Never Smoker  Smokeless Tobacco Never Used  Tobacco Comment   Never used tobacco products    Goals Met:  [place] with exercise equipment Exercise tolerated well No report of cardiac concerns or symptoms Strength training completed today  Goals Unmet:  Not Applicable  Comments: Pt able to follow exercise prescription today without complaint.  Will continue to monitor for progression.    Dr. Emily Filbert is Medical Director for [place] and [facility].

## 2018-03-01 NOTE — Telephone Encounter (Signed)
-----   Message from [place] OuchMuhammad A Arida, MD sent at 03/01/2018  5:30 PM EDT ----- Inform patient that monitor showed frequent PVCs.  I recommend increasing carvedilol to 25 mg twice daily and decreasing lisinopril to 10 mg once daily.

## 2018-03-06 ENCOUNTER — Encounter: Payer: Self-pay | Admitting: *Deleted

## 2018-03-06 DIAGNOSIS — I213 ST elevation (STEMI) myocardial infarction of unspecified site: Secondary | ICD-10-CM

## 2018-03-06 NOTE — Progress Notes (Signed)
Cardiac Individual Treatment Plan  Patient Details  Name: Steve Burnett MRN: [id number] Date of Birth: January 15, 1961 Referring Provider:     Cardiac Rehab from 02/18/2018 in [facility] Cardiac and Pulmonary Rehab  Referring Provider  Isaias Cowman MD [Attending Cardiologist: Kathlyn Sacramento, MD]      Initial Encounter Date:    Cardiac Rehab from 02/18/2018 in [facility] Cardiac and Pulmonary Rehab  Date  02/18/18      Visit Diagnosis: ST elevation myocardial infarction (STEMI), unspecified artery ([place])  Patient's Home Medications on Admission:  Current Outpatient Medications:  .  acetaminophen (TYLENOL) 650 MG CR tablet, Take 650 mg by mouth as needed., Disp: , Rfl:  .  ALPRAZolam (XANAX) 0.5 MG tablet, TAKE ONE TABLET BY MOUTH AT BEDTIME AS NEEDED FOR ANXIETY, Disp: 30 tablet, Rfl: 0 .  aspirin 81 MG chewable tablet, Chew 1 tablet (81 mg total) by mouth daily., Disp: 30 tablet, Rfl: 11 .  carvedilol (COREG) 25 MG tablet, Take 1 tablet (25 mg total) by mouth 2 (two) times daily with a meal., Disp: 180 tablet, Rfl: 3 .  lisinopril (PRINIVIL,ZESTRIL) 10 MG tablet, Take 1 tablet (10 mg total) by mouth daily., Disp: , Rfl:  .  rosuvastatin (CRESTOR) 10 MG tablet, Take 1 tablet (10 mg total) by mouth daily at 6 PM., Disp: 30 tablet, Rfl: 5 .  spironolactone (ALDACTONE) 25 MG tablet, Take 0.5 tablets (12.5 mg total) by mouth daily., Disp: 30 tablet, Rfl: 5 .  ticagrelor (BRILINTA) 90 MG TABS tablet, Take 1 tablet (90 mg total) by mouth 2 (two) times daily., Disp: 180 tablet, Rfl: 3  Past Medical History: Past Medical History:  Diagnosis Date  . Anxiety   . Chicken pox   . Coronary artery disease    a. 02/2012 NSTEMI/Cath/PCI: pLAD 60%, mLAD 80%, dLAD 70, nonobs LCX/RCA --> DES to prox/mid LAD; b. inf STEMI 01/23/18: ost/proxLAD 75% at the prox edge of the previously placed stent, mLAD 75%, OM1 50%, OM2 50%, pRCA 100% s/p PCI/DES, rPLA 70%  . Hyperlipidemia   . Hypertension   . Ischemic  cardiomyopathy    a. echo 7/19: EF 35-40%, HK of the inf wall, Gr1DD, nl sized LA, RVSF nl, PASP nl, challenging image quality  . Obesity   . OSA (obstructive sleep apnea) 03/27/2014  . PAF (paroxysmal atrial fibrillation) ([place])    a. noted during [facility] 01/23/18 during inferior STEMI; b. resolved with IV amiodarone; c. not placed on anticoagulation given brief episode, acute illness, and need for DAPT  . VF (ventricular fibrillation) ([place])    a. noted during [facility] 01/23/2018 s/p defibrillation x 2    Tobacco Use: Social History   Tobacco Use  Smoking Status Never Smoker  Smokeless Tobacco Never Used  Tobacco Comment   Never used tobacco products    Labs: Recent Review Flowsheet Data    Labs for ITP Cardiac and Pulmonary Rehab Latest Ref Rng & Units 03/11/2012 01/15/2014 08/08/2016 01/23/2018   Cholestrol 0 - 200 mg/dL 167 149 142 204(H)   LDLCALC 0 - 99 mg/dL 106(H) 83 - 132(H)   LDLDIRECT mg/dL - - 79.0 -   HDL >40 mg/dL 41 41 37.50(L) 43   Trlycerides <150 mg/dL 98 123 230.0(H) 144   Hemoglobin A1c 4.8 - 5.6 % 6.1 - 6.4 6.0(H)       Exercise Target Goals: Exercise Program Goal: Individual exercise prescription set using results from initial 6 min walk test and THRR while considering  patient's activity barriers and safety.  Exercise Prescription Goal: Initial exercise prescription builds to 30-45 minutes a day of aerobic activity, 2-3 days per week.  Home exercise guidelines will be given to patient during program as part of exercise prescription that the participant will acknowledge.  Activity Barriers & Risk Stratification: Activity Barriers & Cardiac Risk Stratification - 02/18/18 1255      Activity Barriers & Cardiac Risk Stratification   Activity Barriers  Arthritis;Muscular Weakness;Shortness of Breath;Deconditioning   History of lower disc problems, SOB new since discharge from hospital. New RX Epifania Gore, advised [name] to speak to his MD about this med and possible side effects,  versus other reasons that could cause the SOB   Cardiac Risk Stratification  High       6 Minute Walk: 6 Minute Walk    Row Name 02/18/18 1513         6 Minute Walk   Phase  Initial     Distance  1300 feet     Walk Time  6 minutes     # of Rest Breaks  0     MPH  2.46     METS  2.92     RPE  11     VO2 Peak  10.22     Symptoms  Yes (comment)     Comments  "Wishy washy feeling" during bigeminal PVCs     Resting HR  72 bpm     Resting BP  122/64     Resting Oxygen Saturation   98 %     Exercise Oxygen Saturation  during 6 min walk  98 %     Max Ex. HR  112 bpm     Max Ex. BP  146/74     2 Minute Post BP  132/74        Oxygen Initial Assessment:   Oxygen Re-Evaluation:   Oxygen Discharge (Final Oxygen Re-Evaluation):   Initial Exercise Prescription: Initial Exercise Prescription - 02/18/18 1500      Date of Initial Exercise RX and Referring Provider   Date  02/18/18    Referring Provider  Paraschos, Alexander MD   Attending Cardiologist: Kathlyn Sacramento, MD     Treadmill   MPH  2.5    Grade  0    Minutes  15    METs  2.91      REL-XR   Level  2    Speed  50    Minutes  15    METs  2.9      T5 Nustep   Level  3    SPM  80    Minutes  15    METs  2.9      Prescription Details   Frequency (times per week)  3    Duration  Progress to 45 minutes of aerobic exercise without signs/symptoms of physical distress      Intensity   THRR 40-80% of Max Heartrate  110-147    Ratings of Perceived Exertion  11-13    Perceived Dyspnea  0-4      Progression   Progression  Continue to progress workloads to maintain intensity without signs/symptoms of physical distress.      Resistance Training   Training Prescription  Yes    Weight  4 lbs    Reps  10-15       Perform Capillary Blood Glucose checks as needed.  Exercise Prescription Changes: Exercise Prescription Changes    Row Name 02/18/18 1200 02/26/18 1500  Response to Exercise    Blood Pressure (Admit)  122/64  146/84      Blood Pressure (Exercise)  146/74  142/82      Blood Pressure (Exit)  132/74  128/66      Heart Rate (Admit)  72 bpm  81 bpm      Heart Rate (Exercise)  102 bpm  101 bpm      Heart Rate (Exit)  72 bpm  75 bpm      Oxygen Saturation (Admit)  98 %  -      Oxygen Saturation (Exercise)  98 %  -      Rating of Perceived Exertion (Exercise)  11  12      Symptoms  bigeminal PVCs, felt "wishy washy"  none      Comments  walk test results  first full day of exercise      Duration  -  Progress to 45 minutes of aerobic exercise without signs/symptoms of physical distress      Intensity  -  THRR unchanged        Progression   Progression  -  Continue to progress workloads to maintain intensity without signs/symptoms of physical distress.      Average METs  -  2.61        Resistance Training   Training Prescription  -  Yes      Weight  -  4 lbs      Reps  -  10-15        Interval Training   Interval Training  -  No        Treadmill   MPH  -  2.5      Grade  -  0      Minutes  -  15      METs  -  2.91        T5 Nustep   Level  -  3      Minutes  -  15      METs  -  2.3         Exercise Comments: Exercise Comments    Row Name 02/25/18 [phone]           Exercise Comments   First full day of exercise!  Patient was oriented to gym and equipment including functions, settings, policies, and procedures.  Patient's individual exercise prescription and treatment plan were reviewed.  All starting workloads were established based on the results of the 6 minute walk test done at initial orientation visit.  The plan for exercise progression was also introduced and progression will be customized based on patient's performance and goals.          Exercise Goals and Review: Exercise Goals    Row Name 02/18/18 1521             Exercise Goals   Increase Physical Activity  Yes       Intervention  Provide advice, education, support and counseling  about physical activity/exercise needs.;Develop an individualized exercise prescription for aerobic and resistive training based on initial evaluation findings, risk stratification, comorbidities and participant's personal goals.       Expected Outcomes  Short Term: Attend rehab on a regular basis to increase amount of physical activity.;Long Term: Add in home exercise to make exercise part of routine and to increase amount of physical activity.;Long Term: Exercising regularly at least 3-5 days a week.       Increase Strength and Stamina  Yes       Intervention  Provide advice, education, support and counseling about physical activity/exercise needs.;Develop an individualized exercise prescription for aerobic and resistive training based on initial evaluation findings, risk stratification, comorbidities and participant's personal goals.       Expected Outcomes  Short Term: Increase workloads from initial exercise prescription for resistance, speed, and METs.;Short Term: Perform resistance training exercises routinely during rehab and add in resistance training at home;Long Term: Improve cardiorespiratory fitness, muscular endurance and strength as measured by increased METs and functional capacity (6MWT)       Able to understand and use rate of perceived exertion (RPE) scale  Yes       Intervention  Provide education and explanation on how to use RPE scale       Expected Outcomes  Short Term: Able to use RPE daily in rehab to express subjective intensity level;Long Term:  Able to use RPE to guide intensity level when exercising independently       Able to understand and use Dyspnea scale  Yes       Intervention  Provide education and explanation on how to use Dyspnea scale       Expected Outcomes  Short Term: Able to use Dyspnea scale daily in rehab to express subjective sense of shortness of breath during exertion;Long Term: Able to use Dyspnea scale to guide intensity level when exercising independently        Knowledge and understanding of Target Heart Rate Range (THRR)  Yes       Intervention  Provide education and explanation of THRR including how the numbers were predicted and where they are located for reference       Expected Outcomes  Short Term: Able to state/look up THRR;Short Term: Able to use daily as guideline for intensity in rehab;Long Term: Able to use THRR to govern intensity when exercising independently       Able to check pulse independently  Yes       Intervention  Review the importance of being able to check your own pulse for safety during independent exercise;Provide education and demonstration on how to check pulse in carotid and radial arteries.       Expected Outcomes  Short Term: Able to explain why pulse checking is important during independent exercise;Long Term: Able to check pulse independently and accurately       Understanding of Exercise Prescription  Yes       Intervention  Provide education, explanation, and written materials on patient's individual exercise prescription       Expected Outcomes  Short Term: Able to explain program exercise prescription;Long Term: Able to explain home exercise prescription to exercise independently          Exercise Goals Re-Evaluation : Exercise Goals Re-Evaluation    Row Name 02/25/18 0812             Exercise Goal Re-Evaluation   Exercise Goals Review  Increase Physical Activity;Increase Strength and Stamina;Able to understand and use rate of perceived exertion (RPE) scale;Able to check pulse independently;Knowledge and understanding of Target Heart Rate Range (THRR);Understanding of Exercise Prescription       Comments  Reviewed RPE scale, THR and program prescription with pt today.  Pt voiced understanding and was given a copy of goals to take home.        Expected Outcomes  Short: Use RPE daily to regulate intensity. Long: Follow program prescription in THR.  Discharge Exercise Prescription (Final Exercise  Prescription Changes): Exercise Prescription Changes - 02/26/18 1500      Response to Exercise   Blood Pressure (Admit)  146/84    Blood Pressure (Exercise)  142/82    Blood Pressure (Exit)  128/66    Heart Rate (Admit)  81 bpm    Heart Rate (Exercise)  101 bpm    Heart Rate (Exit)  75 bpm    Rating of Perceived Exertion (Exercise)  12    Symptoms  none    Comments  first full day of exercise    Duration  Progress to 45 minutes of aerobic exercise without signs/symptoms of physical distress    Intensity  THRR unchanged      Progression   Progression  Continue to progress workloads to maintain intensity without signs/symptoms of physical distress.    Average METs  2.61      Resistance Training   Training Prescription  Yes    Weight  4 lbs    Reps  10-15      Interval Training   Interval Training  No      Treadmill   MPH  2.5    Grade  0    Minutes  15    METs  2.91      T5 Nustep   Level  3    Minutes  15    METs  2.3       Nutrition:  Target Goals: Understanding of nutrition guidelines, daily intake of sodium <1570m, cholesterol <2075m calories 30% from fat and 7% or less from saturated fats, daily to have 5 or more servings of fruits and vegetables.  Biometrics: Pre Biometrics - 02/18/18 1521      Pre Biometrics   Height  5' 10.2" (1.783 m)    Weight  (!) 318 lb 14.4 oz (144.7 kg)    Waist Circumference  47 inches    Hip Circumference  51 inches    Waist to Hip Ratio  0.92 %    BMI (Calculated)  45.5    Single Leg Stand  24.52 seconds        Nutrition Therapy Plan and Nutrition Goals: Nutrition Therapy & Goals - 02/18/18 1258      Intervention Plan   Intervention  Prescribe, educate and counsel regarding individualized specific dietary modifications aiming towards targeted core components such as weight, hypertension, lipid management, diabetes, heart failure and other comorbidities.    Expected Outcomes  Short Term Goal: Understand basic principles  of dietary content, such as calories, fat, sodium, cholesterol and nutrients.;Short Term Goal: A plan has been developed with personal nutrition goals set during dietitian appointment.;Long Term Goal: Adherence to prescribed nutrition plan.       Nutrition Assessments:   Nutrition Goals Re-Evaluation:   Nutrition Goals Discharge (Final Nutrition Goals Re-Evaluation):   Psychosocial: Target Goals: Acknowledge presence or absence of significant depression and/or stress, maximize coping skills, provide positive support system. Participant is able to verbalize types and ability to use techniques and skills needed for reducing stress and depression.   Initial Review & Psychosocial Screening: Initial Psych Review & Screening - 02/18/18 1308      Initial Review   Current issues with  Current Anxiety/Panic;Current Sleep Concerns;Current Stress Concerns    Source of Stress Concerns  Chronic Illness;Financial;Family      Family Dynamics   Good Support System?  Yes   Wife (newly weds) and self   Comments  Stress of his son  not getting along with his new step mother. Stress with children not caring about his needs.   Stress with new SOB symptoms,which are hindering his ability to move on. Sleep concerns: unable to fall asleep because thoughts about death and dying keep him from sleeping.  MD aware and he is prescribed Xnanx for this and it does help him get to sleep.      Barriers   Psychosocial barriers to participate in program  There are no identifiable barriers or psychosocial needs.;The patient should benefit from training in stress management and relaxation.      Screening Interventions   Interventions  Encouraged to exercise;Program counselor consult;To provide support and resources with identified psychosocial needs;Provide feedback about the scores to participant    Expected Outcomes  Short Term goal: Utilizing psychosocial counselor, staff and physician to assist with identification of  specific Stressors or current issues interfering with healing process. Setting desired goal for each stressor or current issue identified.;Long Term Goal: Stressors or current issues are controlled or eliminated.;Short Term goal: Identification and review with participant of any Quality of Life or Depression concerns found by scoring the questionnaire.;Long Term goal: The participant improves quality of Life and PHQ9 Scores as seen by post scores and/or verbalization of changes       Quality of Life Scores:  Quality of Life - 02/18/18 1315      Quality of Life   Select  Quality of Life      Quality of Life Scores   Health/Function Pre  14.27 %    Socioeconomic Pre  15.5 %    Psych/Spiritual Pre  12.86 %    Family Pre  16.7 %    GLOBAL Pre  14.59 %      Scores of 19 and below usually indicate a poorer quality of life in these areas.  A difference of  2-3 points is a clinically meaningful difference.  A difference of 2-3 points in the total score of the Quality of Life Index has been associated with significant improvement in overall quality of life, self-image, physical symptoms, and general health in studies assessing change in quality of life.  PHQ-9: Recent Review Flowsheet Data    Depression screen [facility] 2/9 02/18/2018 08/10/2016 03/03/2014   Decreased Interest 0 0 0   Down, Depressed, Hopeless 1 0 0   PHQ - 2 Score 1 0 0   Altered sleeping 0 - -   Tired, decreased energy 2 - -   Change in appetite 0 - -   Feeling bad or failure about yourself  2 - -   Trouble concentrating 1 - -   Moving slowly or fidgety/restless 0 - -   Suicidal thoughts 0 - -   PHQ-9 Score 6 - -   Difficult doing work/chores Somewhat difficult - -     Interpretation of Total Score  Total Score Depression Severity:  1-4 = Minimal depression, 5-9 = Mild depression, 10-14 = Moderate depression, 15-19 = Moderately severe depression, 20-27 = Severe depression   Psychosocial Evaluation and  Intervention:   Psychosocial Re-Evaluation:   Psychosocial Discharge (Final Psychosocial Re-Evaluation):   Vocational Rehabilitation: Provide vocational rehab assistance to qualifying candidates.   Vocational Rehab Evaluation & Intervention: Vocational Rehab - 02/18/18 1321      Initial Vocational Rehab Evaluation & Intervention   Assessment shows need for Vocational Rehabilitation  No       Education: Education Goals: Education classes will be provided on a variety of topics  geared toward better understanding of heart health and risk factor modification. Participant will state understanding/return demonstration of topics presented as noted by education test scores.  Learning Barriers/Preferences: Learning Barriers/Preferences - 02/18/18 1320      Learning Barriers/Preferences   Learning Barriers  None    Learning Preferences  None       Education Topics:  AED/CPR: - Group verbal and written instruction with the use of models to demonstrate the basic use of the AED with the basic ABC's of resuscitation.   General Nutrition Guidelines/Fats and Fiber: -Group instruction provided by verbal, written material, models and posters to present the general guidelines for heart healthy nutrition. Gives an explanation and review of dietary fats and fiber.   Controlling Sodium/Reading Food Labels: -Group verbal and written material supporting the discussion of sodium use in heart healthy nutrition. Review and explanation with models, verbal and written materials for utilization of the food label.   Exercise Physiology & General Exercise Guidelines: - Group verbal and written instruction with models to review the exercise physiology of the cardiovascular system and associated critical values. Provides general exercise guidelines with specific guidelines to those with heart or lung disease.    Aerobic Exercise & Resistance Training: - Gives group verbal and written instruction on  the various components of exercise. Focuses on aerobic and resistive training programs and the benefits of this training and how to safely progress through these programs..   Cardiac Rehab from 02/25/2018 in [facility] Cardiac and Pulmonary Rehab  Date  02/25/18  Educator  [facility]  Instruction Review Code  1- Verbalizes Understanding      Flexibility, Balance, Mind/Body Relaxation: Provides group verbal/written instruction on the benefits of flexibility and balance training, including mind/body exercise modes such as yoga, pilates and tai chi.  Demonstration and skill practice provided.   Stress and Anxiety: - Provides group verbal and written instruction about the health risks of elevated stress and causes of high stress.  Discuss the correlation between heart/lung disease and anxiety and treatment options. Review healthy ways to manage with stress and anxiety.   Depression: - Provides group verbal and written instruction on the correlation between heart/lung disease and depressed mood, treatment options, and the stigmas associated with seeking treatment.   Anatomy & Physiology of the Heart: - Group verbal and written instruction and models provide basic cardiac anatomy and physiology, with the coronary electrical and arterial systems. Review of Valvular disease and Heart Failure   Cardiac Procedures: - Group verbal and written instruction to review commonly prescribed medications for heart disease. Reviews the medication, class of the drug, and side effects. Includes the steps to properly store meds and maintain the prescription regimen. (beta blockers and nitrates)   Cardiac Medications I: - Group verbal and written instruction to review commonly prescribed medications for heart disease. Reviews the medication, class of the drug, and side effects. Includes the steps to properly store meds and maintain the prescription regimen.   Cardiac Medications II: -Group verbal and written instruction to  review commonly prescribed medications for heart disease. Reviews the medication, class of the drug, and side effects. (all other drug classes)    Go Sex-Intimacy & Heart Disease, Get SMART - Goal Setting: - Group verbal and written instruction through game format to discuss heart disease and the return to sexual intimacy. Provides group verbal and written material to discuss and apply goal setting through the application of the S.M.A.R.T. Method.   Other Matters of the Heart: - Provides group  verbal, written materials and models to describe Stable Angina and Peripheral Artery. Includes description of the disease process and treatment options available to the cardiac patient.   Exercise & Equipment Safety: - Individual verbal instruction and demonstration of equipment use and safety with use of the equipment.   Cardiac Rehab from 02/25/2018 in [facility] Cardiac and Pulmonary Rehab  Date  02/18/18  Educator  SB  Instruction Review Code  1- Verbalizes Understanding      Infection Prevention: - Provides verbal and written material to individual with discussion of infection control including proper hand washing and proper equipment cleaning during exercise session.   Cardiac Rehab from 02/25/2018 in [facility] Cardiac and Pulmonary Rehab  Date  02/18/18  Educator  SB  Instruction Review Code  1- Verbalizes Understanding      Falls Prevention: - Provides verbal and written material to individual with discussion of falls prevention and safety.   Cardiac Rehab from 02/25/2018 in [facility] Cardiac and Pulmonary Rehab  Date  02/18/18  Educator  SB  Instruction Review Code  1- Verbalizes Understanding      Diabetes: - Individual verbal and written instruction to review signs/symptoms of diabetes, desired ranges of glucose level fasting, after meals and with exercise. Acknowledge that pre and post exercise glucose checks will be done for 3 sessions at entry of program.   Know Your Numbers and Risk  Factors: -Group verbal and written instruction about important numbers in your health.  Discussion of what are risk factors and how they play a role in the disease process.  Review of Cholesterol, Blood Pressure, Diabetes, and BMI and the role they play in your overall health.   Sleep Hygiene: -Provides group verbal and written instruction about how sleep can affect your health.  Define sleep hygiene, discuss sleep cycles and impact of sleep habits. Review good sleep hygiene tips.    Other: -Provides group and verbal instruction on various topics (see comments)   Knowledge Questionnaire Score: Knowledge Questionnaire Score - 02/18/18 1320      Knowledge Questionnaire Score   Pre Score  21/26   Reviewed correct responses with [name] today.He verbalized understanding of the responses and had no further questions.      Core Components/Risk Factors/Patient Goals at Admission: Personal Goals and Risk Factors at Admission - 02/18/18 1259      Core Components/Risk Factors/Patient Goals on Admission    Weight Management  Yes;Obesity;Weight Loss    Intervention  Weight Management: Develop a combined nutrition and exercise program designed to reach desired caloric intake, while maintaining appropriate intake of nutrient and fiber, sodium and fats, and appropriate energy expenditure required for the weight goal.;Weight Management: Provide education and appropriate resources to help participant work on and attain dietary goals.;Weight Management/Obesity: Establish reasonable short term and long term weight goals.;Obesity: Provide education and appropriate resources to help participant work on and attain dietary goals.    Admit Weight  318 lb 12.8 oz (144.6 kg)    Goal Weight: Short Term  315 lb (142.9 kg)    Goal Weight: Long Term  220 lb (99.8 kg)    Expected Outcomes  Short Term: Continue to assess and modify interventions until short term weight is achieved;Long Term: Adherence to nutrition and  physical activity/exercise program aimed toward attainment of established weight goal;Weight Loss: Understanding of general recommendations for a balanced deficit meal plan, which promotes 1-2 lb weight loss per week and includes a negative energy balance of [phone] kcal/d    Hypertension  Yes    Intervention  Provide education on lifestyle modifcations including regular physical activity/exercise, weight management, moderate sodium restriction and increased consumption of fresh fruit, vegetables, and low fat dairy, alcohol moderation, and smoking cessation.;Monitor prescription use compliance.    Expected Outcomes  Short Term: Continued assessment and intervention until BP is < 140/26m HG in hypertensive participants. < 130/872mHG in hypertensive participants with diabetes, heart failure or chronic kidney disease.;Long Term: Maintenance of blood pressure at goal levels.    Lipids  Yes    Intervention  Provide education and support for participant on nutrition & aerobic/resistive exercise along with prescribed medications to achieve LDL <701mHDL >59m52m  Expected Outcomes  Short Term: Participant states understanding of desired cholesterol values and is compliant with medications prescribed. Participant is following exercise prescription and nutrition guidelines.;Long Term: Cholesterol controlled with medications as prescribed, with individualized exercise RX and with personalized nutrition plan. Value goals: LDL < 70mg79mL > 40 mg.    Stress  Yes   Family stress and chronic illness    Intervention  Offer individual and/or small group education and counseling on adjustment to heart disease, stress management and health-related lifestyle change. Teach and support self-help strategies.;Refer participants experiencing significant psychosocial distress to appropriate mental health specialists for further evaluation and treatment. When possible, include family members and significant others in  education/counseling sessions.    Expected Outcomes  Short Term: Participant demonstrates changes in health-related behavior, relaxation and other stress management skills, ability to obtain effective social support, and compliance with psychotropic medications if prescribed.;Long Term: Emotional wellbeing is indicated by absence of clinically significant psychosocial distress or social isolation.       Core Components/Risk Factors/Patient Goals Review:    Core Components/Risk Factors/Patient Goals at Discharge (Final Review):    ITP Comments: ITP Comments    Row Name 02/18/18 1253 02/18/18 1523 03/06/18 0748       ITP Comments  Medical review completed today. ITP sent to Dr M MilLoleta Chancereview,changes as needed and signature.   [facility]  Pt has been complaining of having "wishy washy" feeling more and more frequently.  When he mentioned this during his walk test, he was in bigeminy.  Once it cleared, he did report feeling better.  He was in and out of bigeminy during the test.  Note sent to cardiologist to be made aware as he has not brought it up before during an appointment.   30 day review completed. ITP sent to Dr. Bert Ramonita Labering for Dr. Mark Emily Filbertical Director of Cardiac Rehab. Continue with ITP unless changes are made by physician.  New to program.        Comments: 30 day review

## 2018-03-08 ENCOUNTER — Telehealth: Payer: Self-pay | Admitting: *Deleted

## 2018-03-08 ENCOUNTER — Encounter: Payer: Self-pay | Admitting: *Deleted

## 2018-03-08 DIAGNOSIS — I213 ST elevation (STEMI) myocardial infarction of unspecified site: Secondary | ICD-10-CM

## 2018-03-08 NOTE — Telephone Encounter (Signed)
Steve Burnett left a message that they making cut backs at work and he does not want to be laid off.  Called him back to verify.  He would like to discharge at this time.

## 2018-03-08 NOTE — Progress Notes (Signed)
Discharge Progress Report  Patient Details Name: Steve Burnett MRN: [id number] Date of Birth: Jun 09, 1961 Referring Provider:     Cardiac Rehab from 02/18/2018 in [facility]  Referring Provider  Paraschos, Alexander MD [Attending Cardiologist: Lorine Bears, MD]       Number of Visits: 3  Reason for Discharge:  Early Exit:  Back to work  Smoking History:  Social History   Tobacco Use  Smoking Status Never Smoker  Smokeless Tobacco Never Used  Tobacco Comment   Never used tobacco products    Diagnosis:  ST elevation myocardial infarction (STEMI), unspecified artery (HCC)  ADL [facility]:   Initial Exercise Prescription: Initial Exercise Prescription - 02/18/18 1500      Date of Initial Exercise RX and Referring Provider   Date  02/18/18    Referring Provider  Marcina Millard MD   Attending Cardiologist: Lorine Bears, MD     Treadmill   MPH  2.5    Grade  0    Minutes  15    METs  2.91      REL-XR   Level  2    Speed  50    Minutes  15    METs  2.9      T5 Nustep   Level  3    SPM  80    Minutes  15    METs  2.9      Prescription Details   Frequency (times per week)  3    Duration  Progress to 45 minutes of aerobic exercise without signs/symptoms of physical distress      Intensity   THRR 40-80% of Max Heartrate  110-147    Ratings of Perceived Exertion  11-13    Perceived Dyspnea  0-4      Progression   Progression  Continue to progress workloads to maintain intensity without signs/symptoms of physical distress.      Resistance Training   Training Prescription  Yes    Weight  4 lbs    Reps  10-15       Discharge Exercise Prescription (Final Exercise Prescription Changes): Exercise Prescription Changes - 02/26/18 1500      Response to Exercise   Blood Pressure (Admit)  146/84    Blood Pressure (Exercise)  142/82    Blood Pressure (Exit)  128/66    Heart Rate (Admit)  81 bpm    Heart Rate (Exercise)  101 bpm    Heart Rate (Exit)  75 bpm    Rating of Perceived Exertion (Exercise)  12    Symptoms  none    Comments  first full day of exercise    Duration  Progress to 45 minutes of aerobic exercise without signs/symptoms of physical distress    Intensity  THRR unchanged      Progression   Progression  Continue to progress workloads to maintain intensity without signs/symptoms of physical distress.    Average METs  2.61      Resistance Training   Training Prescription  Yes    Weight  4 lbs    Reps  10-15      Interval Training   Interval Training  No      Treadmill   MPH  2.5    Grade  0    Minutes  15    METs  2.91      T5 Nustep   Level  3    Minutes  15    METs  2.3  Functional Capacity: 6 Minute Walk    Row Name 02/18/18 1513         6 Minute Walk   Phase  Initial     Distance  1300 feet     Walk Time  6 minutes     # of Rest Breaks  0     MPH  2.46     METS  2.92     RPE  11     VO2 Peak  10.22     Symptoms  Yes (comment)     Comments  "Wishy washy feeling" during bigeminal PVCs     Resting HR  72 bpm     Resting BP  122/64     Resting Oxygen Saturation   98 %     Exercise Oxygen Saturation  during 6 min walk  98 %     Max Ex. HR  112 bpm     Max Ex. BP  146/74     2 Minute Post BP  132/74        Psychological, QOL, Others - Outcomes: PHQ 2/9: Depression screen [facility] 2/9 02/18/2018 08/10/2016 03/03/2014  Decreased Interest 0 0 0  Down, Depressed, Hopeless 1 0 0  PHQ - 2 Score 1 0 0  Altered sleeping 0 - -  Tired, decreased energy 2 - -  Change in appetite 0 - -  Feeling bad or failure about yourself  2 - -  Trouble concentrating 1 - -  Moving slowly or fidgety/restless 0 - -  Suicidal thoughts 0 - -  PHQ-9 Score 6 - -  Difficult doing work/chores Somewhat difficult - -    Quality of Life: Quality of Life - 02/18/18 1315      Quality of Life   Select  Quality of Life      Quality of Life Scores   Health/Function Pre  14.27 %    Socioeconomic  Pre  15.5 %    Psych/Spiritual Pre  12.86 %    Family Pre  16.7 %    GLOBAL Pre  14.59 %       Personal Goals: Goals established at orientation with interventions provided to work toward goal. Personal Goals and Risk Factors at Admission - 02/18/18 1259      Core Components/Risk Factors/Patient Goals on Admission    Weight Management  Yes;Obesity;Weight Loss    Intervention  Weight Management: Develop a combined nutrition and exercise program designed to reach desired caloric intake, while maintaining appropriate intake of nutrient and fiber, sodium and fats, and appropriate energy expenditure required for the weight goal.;Weight Management: Provide education and appropriate resources to help participant work on and attain dietary goals.;Weight Management/Obesity: Establish reasonable short term and long term weight goals.;Obesity: Provide education and appropriate resources to help participant work on and attain dietary goals.    Admit Weight  318 lb 12.8 oz (144.6 kg)    Goal Weight: Short Term  315 lb (142.9 kg)    Goal Weight: Long Term  220 lb (99.8 kg)    Expected Outcomes  Short Term: Continue to assess and modify interventions until short term weight is achieved;Long Term: Adherence to nutrition and physical activity/exercise program aimed toward attainment of established weight goal;Weight Loss: Understanding of general recommendations for a balanced deficit meal plan, which promotes 1-2 lb weight loss per week and includes a negative energy balance of [phone] kcal/d    Hypertension  Yes    Intervention  Provide education on lifestyle  modifcations including regular physical activity/exercise, weight management, moderate sodium restriction and increased consumption of fresh fruit, vegetables, and low fat dairy, alcohol moderation, and smoking cessation.;Monitor prescription use compliance.    Expected Outcomes  Short Term: Continued assessment and intervention until BP is < 140/6390mm HG  in hypertensive participants. < 130/7080mm HG in hypertensive participants with diabetes, heart failure or chronic kidney disease.;Long Term: Maintenance of blood pressure at goal levels.    Lipids  Yes    Intervention  Provide education and support for participant on nutrition & aerobic/resistive exercise along with prescribed medications to achieve LDL 70mg , HDL >40mg .    Expected Outcomes  Short Term: Participant states understanding of desired cholesterol values and is compliant with medications prescribed. Participant is following exercise prescription and nutrition guidelines.;Long Term: Cholesterol controlled with medications as prescribed, with individualized exercise RX and with personalized nutrition plan. Value goals: LDL < 70mg , HDL > 40 mg.    Stress  Yes   Family stress and chronic illness    Intervention  Offer individual and/or small group education and counseling on adjustment to heart disease, stress management and health-related lifestyle change. Teach and support self-help strategies.;Refer participants experiencing significant psychosocial distress to appropriate mental health specialists for further evaluation and treatment. When possible, include family members and significant others in education/counseling sessions.    Expected Outcomes  Short Term: Participant demonstrates changes in health-related behavior, relaxation and other stress management skills, ability to obtain effective social support, and compliance with psychotropic medications if prescribed.;Long Term: Emotional wellbeing is indicated by absence of clinically significant psychosocial distress or social isolation.        Personal Goals Discharge:   Exercise Goals and Review: Exercise Goals    Row Name 02/18/18 1521             Exercise Goals   Increase Physical Activity  Yes       Intervention  Provide advice, education, support and counseling about physical activity/exercise needs.;Develop an  individualized exercise prescription for aerobic and resistive training based on initial evaluation findings, risk stratification, comorbidities and participant's personal goals.       Expected Outcomes  Short Term: Attend rehab on a regular basis to increase amount of physical activity.;Long Term: Add in home exercise to make exercise part of routine and to increase amount of physical activity.;Long Term: Exercising regularly at least 3-5 days a week.       Increase Strength and Stamina  Yes       Intervention  Provide advice, education, support and counseling about physical activity/exercise needs.;Develop an individualized exercise prescription for aerobic and resistive training based on initial evaluation findings, risk stratification, comorbidities and participant's personal goals.       Expected Outcomes  Short Term: Increase workloads from initial exercise prescription for resistance, speed, and METs.;Short Term: Perform resistance training exercises routinely during rehab and add in resistance training at home;Long Term: Improve cardiorespiratory fitness, muscular endurance and strength as measured by increased METs and functional capacity (6MWT)       Able to understand and use rate of perceived exertion (RPE) scale  Yes       Intervention  Provide education and explanation on how to use RPE scale       Expected Outcomes  Short Term: Able to use RPE daily in rehab to express subjective intensity level;Long Term:  Able to use RPE to guide intensity level when exercising independently       Able to understand and use  Dyspnea scale  Yes       Intervention  Provide education and explanation on how to use Dyspnea scale       Expected Outcomes  Short Term: Able to use Dyspnea scale daily in rehab to express subjective sense of shortness of breath during exertion;Long Term: Able to use Dyspnea scale to guide intensity level when exercising independently       Knowledge and understanding of Target Heart  Rate Range (THRR)  Yes       Intervention  Provide education and explanation of THRR including how the numbers were predicted and where they are located for reference       Expected Outcomes  Short Term: Able to state/look up THRR;Short Term: Able to use daily as guideline for intensity in rehab;Long Term: Able to use THRR to govern intensity when exercising independently       Able to check pulse independently  Yes       Intervention  Review the importance of being able to check your own pulse for safety during independent exercise;Provide education and demonstration on how to check pulse in carotid and radial arteries.       Expected Outcomes  Short Term: Able to explain why pulse checking is important during independent exercise;Long Term: Able to check pulse independently and accurately       Understanding of Exercise Prescription  Yes       Intervention  Provide education, explanation, and written materials on patient's individual exercise prescription       Expected Outcomes  Short Term: Able to explain program exercise prescription;Long Term: Able to explain home exercise prescription to exercise independently          Nutrition & Weight - Outcomes: Pre Biometrics - 02/18/18 1521      Pre Biometrics   Height  5' 10.2" (1.783 m)    Weight  (!) 318 lb 14.4 oz (144.7 kg)    Waist Circumference  47 inches    Hip Circumference  51 inches    Waist to Hip Ratio  0.92 %    BMI (Calculated)  45.5    Single Leg Stand  24.52 seconds        Nutrition: Nutrition Therapy & Goals - 02/18/18 1258      Intervention Plan   Intervention  Prescribe, educate and counsel regarding individualized specific dietary modifications aiming towards targeted core components such as weight, hypertension, lipid management, diabetes, heart failure and other comorbidities.    Expected Outcomes  Short Term Goal: Understand basic principles of dietary content, such as calories, fat, sodium, cholesterol and  nutrients.;Short Term Goal: A plan has been developed with personal nutrition goals set during dietitian appointment.;Long Term Goal: Adherence to prescribed nutrition plan.       Nutrition Discharge:   Education Questionnaire Score: Knowledge Questionnaire Score - 02/18/18 1320      Knowledge Questionnaire Score   Pre Score  21/26   Reviewed correct responses with [name] today.He verbalized understanding of the responses and had no further questions.      Goals reviewed with patient; copy given to patient.

## 2018-03-08 NOTE — Progress Notes (Signed)
Cardiac Individual Treatment Plan  Patient Details  Name: Steve Burnett MRN: [id number] Date of Birth: 15-Jun-1961 Referring Provider:     Cardiac Rehab from 02/18/2018 in [facility] Cardiac and Pulmonary Rehab  Referring Provider  Isaias Cowman MD [Attending Cardiologist: Kathlyn Sacramento, MD]      Initial Encounter Date:    Cardiac Rehab from 02/18/2018 in [facility] Cardiac and Pulmonary Rehab  Date  02/18/18      Visit Diagnosis: ST elevation myocardial infarction (STEMI), unspecified artery ([place])  Patient's Home Medications on Admission:  Current Outpatient Medications:  .  acetaminophen (TYLENOL) 650 MG CR tablet, Take 650 mg by mouth as needed., Disp: , Rfl:  .  ALPRAZolam (XANAX) 0.5 MG tablet, TAKE ONE TABLET BY MOUTH AT BEDTIME AS NEEDED FOR ANXIETY, Disp: 30 tablet, Rfl: 0 .  aspirin 81 MG chewable tablet, Chew 1 tablet (81 mg total) by mouth daily., Disp: 30 tablet, Rfl: 11 .  carvedilol (COREG) 25 MG tablet, Take 1 tablet (25 mg total) by mouth 2 (two) times daily with a meal., Disp: 180 tablet, Rfl: 3 .  lisinopril (PRINIVIL,ZESTRIL) 10 MG tablet, Take 1 tablet (10 mg total) by mouth daily., Disp: , Rfl:  .  rosuvastatin (CRESTOR) 10 MG tablet, Take 1 tablet (10 mg total) by mouth daily at 6 PM., Disp: 30 tablet, Rfl: 5 .  spironolactone (ALDACTONE) 25 MG tablet, Take 0.5 tablets (12.5 mg total) by mouth daily., Disp: 30 tablet, Rfl: 5 .  ticagrelor (BRILINTA) 90 MG TABS tablet, Take 1 tablet (90 mg total) by mouth 2 (two) times daily., Disp: 180 tablet, Rfl: 3  Past Medical History: Past Medical History:  Diagnosis Date  . Anxiety   . Chicken pox   . Coronary artery disease    a. 02/2012 NSTEMI/Cath/PCI: pLAD 60%, mLAD 80%, dLAD 70, nonobs LCX/RCA --> DES to prox/mid LAD; b. inf STEMI 01/23/18: ost/proxLAD 75% at the prox edge of the previously placed stent, mLAD 75%, OM1 50%, OM2 50%, pRCA 100% s/p PCI/DES, rPLA 70%  . Hyperlipidemia   . Hypertension   . Ischemic  cardiomyopathy    a. echo 7/19: EF 35-40%, HK of the inf wall, Gr1DD, nl sized LA, RVSF nl, PASP nl, challenging image quality  . Obesity   . OSA (obstructive sleep apnea) 03/27/2014  . PAF (paroxysmal atrial fibrillation) ([place])    a. noted during [facility] 01/23/18 during inferior STEMI; b. resolved with IV amiodarone; c. not placed on anticoagulation given brief episode, acute illness, and need for DAPT  . VF (ventricular fibrillation) ([place])    a. noted during [facility] 01/23/2018 s/p defibrillation x 2    Tobacco Use: Social History   Tobacco Use  Smoking Status Never Smoker  Smokeless Tobacco Never Used  Tobacco Comment   Never used tobacco products    Labs: Recent Review Flowsheet Data    Labs for ITP Cardiac and Pulmonary Rehab Latest Ref Rng & Units 03/11/2012 01/15/2014 08/08/2016 01/23/2018   Cholestrol 0 - 200 mg/dL 167 149 142 204(H)   LDLCALC 0 - 99 mg/dL 106(H) 83 - 132(H)   LDLDIRECT mg/dL - - 79.0 -   HDL >40 mg/dL 41 41 37.50(L) 43   Trlycerides <150 mg/dL 98 123 230.0(H) 144   Hemoglobin A1c 4.8 - 5.6 % 6.1 - 6.4 6.0(H)       Exercise Target Goals: Exercise Program Goal: Individual exercise prescription set using results from initial 6 min walk test and THRR while considering  patient's activity barriers and safety.  Exercise Prescription Goal: Initial exercise prescription builds to 30-45 minutes a day of aerobic activity, 2-3 days per week.  Home exercise guidelines will be given to patient during program as part of exercise prescription that the participant will acknowledge.  Activity Barriers & Risk Stratification: Activity Barriers & Cardiac Risk Stratification - 02/18/18 1255      Activity Barriers & Cardiac Risk Stratification   Activity Barriers  Arthritis;Muscular Weakness;Shortness of Breath;Deconditioning   History of lower disc problems, SOB new since discharge from hospital. New RX Epifania Gore, advised [name] to speak to his MD about this med and possible side effects,  versus other reasons that could cause the SOB   Cardiac Risk Stratification  High       6 Minute Walk: 6 Minute Walk    Row Name 02/18/18 1513         6 Minute Walk   Phase  Initial     Distance  1300 feet     Walk Time  6 minutes     # of Rest Breaks  0     MPH  2.46     METS  2.92     RPE  11     VO2 Peak  10.22     Symptoms  Yes (comment)     Comments  "Wishy washy feeling" during bigeminal PVCs     Resting HR  72 bpm     Resting BP  122/64     Resting Oxygen Saturation   98 %     Exercise Oxygen Saturation  during 6 min walk  98 %     Max Ex. HR  112 bpm     Max Ex. BP  146/74     2 Minute Post BP  132/74        Oxygen Initial Assessment:   Oxygen Re-Evaluation:   Oxygen Discharge (Final Oxygen Re-Evaluation):   Initial Exercise Prescription: Initial Exercise Prescription - 02/18/18 1500      Date of Initial Exercise RX and Referring Provider   Date  02/18/18    Referring Provider  Paraschos, Alexander MD   Attending Cardiologist: Kathlyn Sacramento, MD     Treadmill   MPH  2.5    Grade  0    Minutes  15    METs  2.91      REL-XR   Level  2    Speed  50    Minutes  15    METs  2.9      T5 Nustep   Level  3    SPM  80    Minutes  15    METs  2.9      Prescription Details   Frequency (times per week)  3    Duration  Progress to 45 minutes of aerobic exercise without signs/symptoms of physical distress      Intensity   THRR 40-80% of Max Heartrate  110-147    Ratings of Perceived Exertion  11-13    Perceived Dyspnea  0-4      Progression   Progression  Continue to progress workloads to maintain intensity without signs/symptoms of physical distress.      Resistance Training   Training Prescription  Yes    Weight  4 lbs    Reps  10-15       Perform Capillary Blood Glucose checks as needed.  Exercise Prescription Changes: Exercise Prescription Changes    Row Name 02/18/18 1200 02/26/18 1500  Response to Exercise    Blood Pressure (Admit)  122/64  146/84      Blood Pressure (Exercise)  146/74  142/82      Blood Pressure (Exit)  132/74  128/66      Heart Rate (Admit)  72 bpm  81 bpm      Heart Rate (Exercise)  102 bpm  101 bpm      Heart Rate (Exit)  72 bpm  75 bpm      Oxygen Saturation (Admit)  98 %  -      Oxygen Saturation (Exercise)  98 %  -      Rating of Perceived Exertion (Exercise)  11  12      Symptoms  bigeminal PVCs, felt "wishy washy"  none      Comments  walk test results  first full day of exercise      Duration  -  Progress to 45 minutes of aerobic exercise without signs/symptoms of physical distress      Intensity  -  THRR unchanged        Progression   Progression  -  Continue to progress workloads to maintain intensity without signs/symptoms of physical distress.      Average METs  -  2.61        Resistance Training   Training Prescription  -  Yes      Weight  -  4 lbs      Reps  -  10-15        Interval Training   Interval Training  -  No        Treadmill   MPH  -  2.5      Grade  -  0      Minutes  -  15      METs  -  2.91        T5 Nustep   Level  -  3      Minutes  -  15      METs  -  2.3         Exercise Comments: Exercise Comments    Row Name 02/25/18 [phone]           Exercise Comments   First full day of exercise!  Patient was oriented to gym and equipment including functions, settings, policies, and procedures.  Patient's individual exercise prescription and treatment plan were reviewed.  All starting workloads were established based on the results of the 6 minute walk test done at initial orientation visit.  The plan for exercise progression was also introduced and progression will be customized based on patient's performance and goals.          Exercise Goals and Review: Exercise Goals    Row Name 02/18/18 1521             Exercise Goals   Increase Physical Activity  Yes       Intervention  Provide advice, education, support and counseling  about physical activity/exercise needs.;Develop an individualized exercise prescription for aerobic and resistive training based on initial evaluation findings, risk stratification, comorbidities and participant's personal goals.       Expected Outcomes  Short Term: Attend rehab on a regular basis to increase amount of physical activity.;Long Term: Add in home exercise to make exercise part of routine and to increase amount of physical activity.;Long Term: Exercising regularly at least 3-5 days a week.       Increase Strength and Stamina  Yes       Intervention  Provide advice, education, support and counseling about physical activity/exercise needs.;Develop an individualized exercise prescription for aerobic and resistive training based on initial evaluation findings, risk stratification, comorbidities and participant's personal goals.       Expected Outcomes  Short Term: Increase workloads from initial exercise prescription for resistance, speed, and METs.;Short Term: Perform resistance training exercises routinely during rehab and add in resistance training at home;Long Term: Improve cardiorespiratory fitness, muscular endurance and strength as measured by increased METs and functional capacity (6MWT)       Able to understand and use rate of perceived exertion (RPE) scale  Yes       Intervention  Provide education and explanation on how to use RPE scale       Expected Outcomes  Short Term: Able to use RPE daily in rehab to express subjective intensity level;Long Term:  Able to use RPE to guide intensity level when exercising independently       Able to understand and use Dyspnea scale  Yes       Intervention  Provide education and explanation on how to use Dyspnea scale       Expected Outcomes  Short Term: Able to use Dyspnea scale daily in rehab to express subjective sense of shortness of breath during exertion;Long Term: Able to use Dyspnea scale to guide intensity level when exercising independently        Knowledge and understanding of Target Heart Rate Range (THRR)  Yes       Intervention  Provide education and explanation of THRR including how the numbers were predicted and where they are located for reference       Expected Outcomes  Short Term: Able to state/look up THRR;Short Term: Able to use daily as guideline for intensity in rehab;Long Term: Able to use THRR to govern intensity when exercising independently       Able to check pulse independently  Yes       Intervention  Review the importance of being able to check your own pulse for safety during independent exercise;Provide education and demonstration on how to check pulse in carotid and radial arteries.       Expected Outcomes  Short Term: Able to explain why pulse checking is important during independent exercise;Long Term: Able to check pulse independently and accurately       Understanding of Exercise Prescription  Yes       Intervention  Provide education, explanation, and written materials on patient's individual exercise prescription       Expected Outcomes  Short Term: Able to explain program exercise prescription;Long Term: Able to explain home exercise prescription to exercise independently          Exercise Goals Re-Evaluation : Exercise Goals Re-Evaluation    Row Name 02/25/18 0812             Exercise Goal Re-Evaluation   Exercise Goals Review  Increase Physical Activity;Increase Strength and Stamina;Able to understand and use rate of perceived exertion (RPE) scale;Able to check pulse independently;Knowledge and understanding of Target Heart Rate Range (THRR);Understanding of Exercise Prescription       Comments  Reviewed RPE scale, THR and program prescription with pt today.  Pt voiced understanding and was given a copy of goals to take home.        Expected Outcomes  Short: Use RPE daily to regulate intensity. Long: Follow program prescription in THR.  Discharge Exercise Prescription (Final Exercise  Prescription Changes): Exercise Prescription Changes - 02/26/18 1500      Response to Exercise   Blood Pressure (Admit)  146/84    Blood Pressure (Exercise)  142/82    Blood Pressure (Exit)  128/66    Heart Rate (Admit)  81 bpm    Heart Rate (Exercise)  101 bpm    Heart Rate (Exit)  75 bpm    Rating of Perceived Exertion (Exercise)  12    Symptoms  none    Comments  first full day of exercise    Duration  Progress to 45 minutes of aerobic exercise without signs/symptoms of physical distress    Intensity  THRR unchanged      Progression   Progression  Continue to progress workloads to maintain intensity without signs/symptoms of physical distress.    Average METs  2.61      Resistance Training   Training Prescription  Yes    Weight  4 lbs    Reps  10-15      Interval Training   Interval Training  No      Treadmill   MPH  2.5    Grade  0    Minutes  15    METs  2.91      T5 Nustep   Level  3    Minutes  15    METs  2.3       Nutrition:  Target Goals: Understanding of nutrition guidelines, daily intake of sodium <1581m, cholesterol <2098m calories 30% from fat and 7% or less from saturated fats, daily to have 5 or more servings of fruits and vegetables.  Biometrics: Pre Biometrics - 02/18/18 1521      Pre Biometrics   Height  5' 10.2" (1.783 m)    Weight  (!) 318 lb 14.4 oz (144.7 kg)    Waist Circumference  47 inches    Hip Circumference  51 inches    Waist to Hip Ratio  0.92 %    BMI (Calculated)  45.5    Single Leg Stand  24.52 seconds        Nutrition Therapy Plan and Nutrition Goals: Nutrition Therapy & Goals - 02/18/18 1258      Intervention Plan   Intervention  Prescribe, educate and counsel regarding individualized specific dietary modifications aiming towards targeted core components such as weight, hypertension, lipid management, diabetes, heart failure and other comorbidities.    Expected Outcomes  Short Term Goal: Understand basic principles  of dietary content, such as calories, fat, sodium, cholesterol and nutrients.;Short Term Goal: A plan has been developed with personal nutrition goals set during dietitian appointment.;Long Term Goal: Adherence to prescribed nutrition plan.       Nutrition Assessments:   Nutrition Goals Re-Evaluation:   Nutrition Goals Discharge (Final Nutrition Goals Re-Evaluation):   Psychosocial: Target Goals: Acknowledge presence or absence of significant depression and/or stress, maximize coping skills, provide positive support system. Participant is able to verbalize types and ability to use techniques and skills needed for reducing stress and depression.   Initial Review & Psychosocial Screening: Initial Psych Review & Screening - 02/18/18 1308      Initial Review   Current issues with  Current Anxiety/Panic;Current Sleep Concerns;Current Stress Concerns    Source of Stress Concerns  Chronic Illness;Financial;Family      Family Dynamics   Good Support System?  Yes   Wife (newly weds) and self   Comments  Stress of his son  not getting along with his new step mother. Stress with children not caring about his needs.   Stress with new SOB symptoms,which are hindering his ability to move on. Sleep concerns: unable to fall asleep because thoughts about death and dying keep him from sleeping.  MD aware and he is prescribed Xnanx for this and it does help him get to sleep.      Barriers   Psychosocial barriers to participate in program  There are no identifiable barriers or psychosocial needs.;The patient should benefit from training in stress management and relaxation.      Screening Interventions   Interventions  Encouraged to exercise;Program counselor consult;To provide support and resources with identified psychosocial needs;Provide feedback about the scores to participant    Expected Outcomes  Short Term goal: Utilizing psychosocial counselor, staff and physician to assist with identification of  specific Stressors or current issues interfering with healing process. Setting desired goal for each stressor or current issue identified.;Long Term Goal: Stressors or current issues are controlled or eliminated.;Short Term goal: Identification and review with participant of any Quality of Life or Depression concerns found by scoring the questionnaire.;Long Term goal: The participant improves quality of Life and PHQ9 Scores as seen by post scores and/or verbalization of changes       Quality of Life Scores:  Quality of Life - 02/18/18 1315      Quality of Life   Select  Quality of Life      Quality of Life Scores   Health/Function Pre  14.27 %    Socioeconomic Pre  15.5 %    Psych/Spiritual Pre  12.86 %    Family Pre  16.7 %    GLOBAL Pre  14.59 %      Scores of 19 and below usually indicate a poorer quality of life in these areas.  A difference of  2-3 points is a clinically meaningful difference.  A difference of 2-3 points in the total score of the Quality of Life Index has been associated with significant improvement in overall quality of life, self-image, physical symptoms, and general health in studies assessing change in quality of life.  PHQ-9: Recent Review Flowsheet Data    Depression screen [facility] 2/9 02/18/2018 08/10/2016 03/03/2014   Decreased Interest 0 0 0   Down, Depressed, Hopeless 1 0 0   PHQ - 2 Score 1 0 0   Altered sleeping 0 - -   Tired, decreased energy 2 - -   Change in appetite 0 - -   Feeling bad or failure about yourself  2 - -   Trouble concentrating 1 - -   Moving slowly or fidgety/restless 0 - -   Suicidal thoughts 0 - -   PHQ-9 Score 6 - -   Difficult doing work/chores Somewhat difficult - -     Interpretation of Total Score  Total Score Depression Severity:  1-4 = Minimal depression, 5-9 = Mild depression, 10-14 = Moderate depression, 15-19 = Moderately severe depression, 20-27 = Severe depression   Psychosocial Evaluation and  Intervention:   Psychosocial Re-Evaluation:   Psychosocial Discharge (Final Psychosocial Re-Evaluation):   Vocational Rehabilitation: Provide vocational rehab assistance to qualifying candidates.   Vocational Rehab Evaluation & Intervention: Vocational Rehab - 02/18/18 1321      Initial Vocational Rehab Evaluation & Intervention   Assessment shows need for Vocational Rehabilitation  No       Education: Education Goals: Education classes will be provided on a variety of topics  geared toward better understanding of heart health and risk factor modification. Participant will state understanding/return demonstration of topics presented as noted by education test scores.  Learning Barriers/Preferences: Learning Barriers/Preferences - 02/18/18 1320      Learning Barriers/Preferences   Learning Barriers  None    Learning Preferences  None       Education Topics:  AED/CPR: - Group verbal and written instruction with the use of models to demonstrate the basic use of the AED with the basic ABC's of resuscitation.   General Nutrition Guidelines/Fats and Fiber: -Group instruction provided by verbal, written material, models and posters to present the general guidelines for heart healthy nutrition. Gives an explanation and review of dietary fats and fiber.   Controlling Sodium/Reading Food Labels: -Group verbal and written material supporting the discussion of sodium use in heart healthy nutrition. Review and explanation with models, verbal and written materials for utilization of the food label.   Exercise Physiology & General Exercise Guidelines: - Group verbal and written instruction with models to review the exercise physiology of the cardiovascular system and associated critical values. Provides general exercise guidelines with specific guidelines to those with heart or lung disease.    Aerobic Exercise & Resistance Training: - Gives group verbal and written instruction on  the various components of exercise. Focuses on aerobic and resistive training programs and the benefits of this training and how to safely progress through these programs..   Cardiac Rehab from 02/25/2018 in [facility] Cardiac and Pulmonary Rehab  Date  02/25/18  Educator  [facility]  Instruction Review Code  1- Verbalizes Understanding      Flexibility, Balance, Mind/Body Relaxation: Provides group verbal/written instruction on the benefits of flexibility and balance training, including mind/body exercise modes such as yoga, pilates and tai chi.  Demonstration and skill practice provided.   Stress and Anxiety: - Provides group verbal and written instruction about the health risks of elevated stress and causes of high stress.  Discuss the correlation between heart/lung disease and anxiety and treatment options. Review healthy ways to manage with stress and anxiety.   Depression: - Provides group verbal and written instruction on the correlation between heart/lung disease and depressed mood, treatment options, and the stigmas associated with seeking treatment.   Anatomy & Physiology of the Heart: - Group verbal and written instruction and models provide basic cardiac anatomy and physiology, with the coronary electrical and arterial systems. Review of Valvular disease and Heart Failure   Cardiac Procedures: - Group verbal and written instruction to review commonly prescribed medications for heart disease. Reviews the medication, class of the drug, and side effects. Includes the steps to properly store meds and maintain the prescription regimen. (beta blockers and nitrates)   Cardiac Medications I: - Group verbal and written instruction to review commonly prescribed medications for heart disease. Reviews the medication, class of the drug, and side effects. Includes the steps to properly store meds and maintain the prescription regimen.   Cardiac Medications II: -Group verbal and written instruction to  review commonly prescribed medications for heart disease. Reviews the medication, class of the drug, and side effects. (all other drug classes)    Go Sex-Intimacy & Heart Disease, Get SMART - Goal Setting: - Group verbal and written instruction through game format to discuss heart disease and the return to sexual intimacy. Provides group verbal and written material to discuss and apply goal setting through the application of the S.M.A.R.T. Method.   Other Matters of the Heart: - Provides group  verbal, written materials and models to describe Stable Angina and Peripheral Artery. Includes description of the disease process and treatment options available to the cardiac patient.   Exercise & Equipment Safety: - Individual verbal instruction and demonstration of equipment use and safety with use of the equipment.   Cardiac Rehab from 02/25/2018 in [facility] Cardiac and Pulmonary Rehab  Date  02/18/18  Educator  SB  Instruction Review Code  1- Verbalizes Understanding      Infection Prevention: - Provides verbal and written material to individual with discussion of infection control including proper hand washing and proper equipment cleaning during exercise session.   Cardiac Rehab from 02/25/2018 in [facility] Cardiac and Pulmonary Rehab  Date  02/18/18  Educator  SB  Instruction Review Code  1- Verbalizes Understanding      Falls Prevention: - Provides verbal and written material to individual with discussion of falls prevention and safety.   Cardiac Rehab from 02/25/2018 in [facility] Cardiac and Pulmonary Rehab  Date  02/18/18  Educator  SB  Instruction Review Code  1- Verbalizes Understanding      Diabetes: - Individual verbal and written instruction to review signs/symptoms of diabetes, desired ranges of glucose level fasting, after meals and with exercise. Acknowledge that pre and post exercise glucose checks will be done for 3 sessions at entry of program.   Know Your Numbers and Risk  Factors: -Group verbal and written instruction about important numbers in your health.  Discussion of what are risk factors and how they play a role in the disease process.  Review of Cholesterol, Blood Pressure, Diabetes, and BMI and the role they play in your overall health.   Sleep Hygiene: -Provides group verbal and written instruction about how sleep can affect your health.  Define sleep hygiene, discuss sleep cycles and impact of sleep habits. Review good sleep hygiene tips.    Other: -Provides group and verbal instruction on various topics (see comments)   Knowledge Questionnaire Score: Knowledge Questionnaire Score - 02/18/18 1320      Knowledge Questionnaire Score   Pre Score  21/26   Reviewed correct responses with [name] today.He verbalized understanding of the responses and had no further questions.      Core Components/Risk Factors/Patient Goals at Admission: Personal Goals and Risk Factors at Admission - 02/18/18 1259      Core Components/Risk Factors/Patient Goals on Admission    Weight Management  Yes;Obesity;Weight Loss    Intervention  Weight Management: Develop a combined nutrition and exercise program designed to reach desired caloric intake, while maintaining appropriate intake of nutrient and fiber, sodium and fats, and appropriate energy expenditure required for the weight goal.;Weight Management: Provide education and appropriate resources to help participant work on and attain dietary goals.;Weight Management/Obesity: Establish reasonable short term and long term weight goals.;Obesity: Provide education and appropriate resources to help participant work on and attain dietary goals.    Admit Weight  318 lb 12.8 oz (144.6 kg)    Goal Weight: Short Term  315 lb (142.9 kg)    Goal Weight: Long Term  220 lb (99.8 kg)    Expected Outcomes  Short Term: Continue to assess and modify interventions until short term weight is achieved;Long Term: Adherence to nutrition and  physical activity/exercise program aimed toward attainment of established weight goal;Weight Loss: Understanding of general recommendations for a balanced deficit meal plan, which promotes 1-2 lb weight loss per week and includes a negative energy balance of [phone] kcal/d    Hypertension  Yes    Intervention  Provide education on lifestyle modifcations including regular physical activity/exercise, weight management, moderate sodium restriction and increased consumption of fresh fruit, vegetables, and low fat dairy, alcohol moderation, and smoking cessation.;Monitor prescription use compliance.    Expected Outcomes  Short Term: Continued assessment and intervention until BP is < 140/37m HG in hypertensive participants. < 130/82mHG in hypertensive participants with diabetes, heart failure or chronic kidney disease.;Long Term: Maintenance of blood pressure at goal levels.    Lipids  Yes    Intervention  Provide education and support for participant on nutrition & aerobic/resistive exercise along with prescribed medications to achieve LDL <7030mHDL >3m50m  Expected Outcomes  Short Term: Participant states understanding of desired cholesterol values and is compliant with medications prescribed. Participant is following exercise prescription and nutrition guidelines.;Long Term: Cholesterol controlled with medications as prescribed, with individualized exercise RX and with personalized nutrition plan. Value goals: LDL < 70mg47mL > 40 mg.    Stress  Yes   Family stress and chronic illness    Intervention  Offer individual and/or small group education and counseling on adjustment to heart disease, stress management and health-related lifestyle change. Teach and support self-help strategies.;Refer participants experiencing significant psychosocial distress to appropriate mental health specialists for further evaluation and treatment. When possible, include family members and significant others in  education/counseling sessions.    Expected Outcomes  Short Term: Participant demonstrates changes in health-related behavior, relaxation and other stress management skills, ability to obtain effective social support, and compliance with psychotropic medications if prescribed.;Long Term: Emotional wellbeing is indicated by absence of clinically significant psychosocial distress or social isolation.       Core Components/Risk Factors/Patient Goals Review:    Core Components/Risk Factors/Patient Goals at Discharge (Final Review):    ITP Comments: ITP Comments    Row Name 02/18/18 1253 02/18/18 1523 03/06/18 0748 03/08/18 1032     ITP Comments  Medical review completed today. ITP sent to Dr M MilLoleta Chancereview,changes as needed and signature.   [facility]  Pt has been complaining of having "wishy washy" feeling more and more frequently.  When he mentioned this during his walk test, he was in bigeminy.  Once it cleared, he did report feeling better.  He was in and out of bigeminy during the test.  Note sent to cardiologist to be made aware as he has not brought it up before during an appointment.   30 day review completed. ITP sent to Dr. Bert Ramonita Labering for Dr. Mark Emily Filbertical Director of Cardiac Rehab. Continue with ITP unless changes are made by physician.  New to program.  [name] lGershon Mussel a message that they making cut backs at work and he does not want to be laid off.  Called him back to verify.  He would like to discharge at this time.   Discharge ITP created and sent to Dr. KleinCaryl Comesreivew.  Discharge Summary sent to PCP adn cardiologist.        Comments: Discharge ITP

## 2018-03-19 ENCOUNTER — Ambulatory Visit (INDEPENDENT_AMBULATORY_CARE_PROVIDER_SITE_OTHER): Payer: BLUE CROSS/BLUE SHIELD | Admitting: Nurse Practitioner

## 2018-03-19 ENCOUNTER — Encounter: Payer: Self-pay | Admitting: Nurse Practitioner

## 2018-03-19 VITALS — BP 148/80 | HR 71 | Ht 70.0 in | Wt 329.8 lb

## 2018-03-19 DIAGNOSIS — E785 Hyperlipidemia, unspecified: Secondary | ICD-10-CM

## 2018-03-19 DIAGNOSIS — I2510 Atherosclerotic heart disease of native coronary artery without angina pectoris: Secondary | ICD-10-CM

## 2018-03-19 DIAGNOSIS — I10 Essential (primary) hypertension: Secondary | ICD-10-CM

## 2018-03-19 DIAGNOSIS — I255 Ischemic cardiomyopathy: Secondary | ICD-10-CM | POA: Diagnosis not present

## 2018-03-19 DIAGNOSIS — I5022 Chronic systolic (congestive) heart failure: Secondary | ICD-10-CM

## 2018-03-19 MED ORDER — LISINOPRIL 20 MG PO TABS: 20.0000 mg | ORAL_TABLET | Freq: Every day | ORAL | 3 refills | Status: DC

## 2018-03-19 NOTE — Patient Instructions (Signed)
Medication Instructions:  Your physician has recommended you make the following change in your medication:  1- INCREASE Lisinopril 20 mg by mouth once a day.   Labwork: Your physician recommends that you return for lab work NEXT WEEK. You will need to be FASTING. [facility] You will check in at the front desk to the right as you walk into the atrium. Valet Parking is offered if needed.    Testing/Procedures: Your physician has requested that you have an echocardiogram. Echocardiography is a painless test that uses sound waves to create images of your heart. It provides your doctor with information about the size and shape of your heart and how well your heart's chambers and valves are working. This procedure takes approximately one hour. There are no restrictions for this procedure. You may get an IV, if needed, to receive an ultrasound enhancing agent through to better visualize your heart.    Follow-Up: Your physician recommends that you schedule a follow-up appointment in: 2 MONTHS WITH DR ARIDA.   If you need a refill on your cardiac medications before your next appointment, please call your pharmacy.   Echocardiogram An echocardiogram, or echocardiography, uses sound waves (ultrasound) to produce an image of your heart. The echocardiogram is simple, painless, obtained within a short period of time, and offers valuable information to your health care provider. The images from an echocardiogram can provide information such as:  Evidence of coronary artery disease (CAD).  Heart size.  Heart muscle function.  Heart valve function.  Aneurysm detection.  Evidence of a past heart attack.  Fluid buildup around the heart.  Heart muscle thickening.  Assess heart valve function.  Tell a health care provider about:  Any allergies you have.  All medicines you are taking, including vitamins, herbs, eye drops, creams, and over-the-counter  medicines.  Any problems you or family members have had with anesthetic medicines.  Any blood disorders you have.  Any surgeries you have had.  Any medical conditions you have.  Whether you are pregnant or may be pregnant. What happens before the procedure? No special preparation is needed. Eat and drink normally. What happens during the procedure?  In order to produce an image of your heart, gel will be applied to your chest and a wand-like tool (transducer) will be moved over your chest. The gel will help transmit the sound waves from the transducer. The sound waves will harmlessly bounce off your heart to allow the heart images to be captured in real-time motion. These images will then be recorded.  You may need an IV to receive a medicine that improves the quality of the pictures. What happens after the procedure? You may return to your normal schedule including diet, activities, and medicines, unless your health care provider tells you otherwise. This information is not intended to replace advice given to you by your health care provider. Make sure you discuss any questions you have with your health care provider. Document Released: 07/07/2000 Document Revised: 02/26/2016 Document Reviewed: 03/17/2013 Elsevier Interactive Patient Education  2017 [employer]

## 2018-03-19 NOTE — Progress Notes (Signed)
Office Visit    Patient Name: Steve Burnett Date of Encounter: 03/19/2018  Primary Care Provider:  Lorre Munroe, NP Primary Cardiologist:  Lorine Bears, MD  Chief Complaint    57 y/o ? with a h/o CAD s/p prior NSTEMI (2013) and more recent Inferior STEMI, HTN, HL, ICM, HFrEF, obesity, and OSA, who presents for f/u.  Past Medical History    Past Medical History:  Diagnosis Date  . Anxiety   . Chicken pox   . Coronary artery disease    a. 02/2012 NSTEMI/Cath/PCI: pLAD 60%, mLAD 80%, dLAD 70, nonobs LCX/RCA --> DES to prox/mid LAD; b. inf STEMI 01/23/18: ost/proxLAD 75% at the prox edge of the previously placed stent, mLAD 75%, OM1 50%, OM2 50%, pRCA 100% s/p PCI/DES, rPLA 70%; c. 02/2018 MV: Ef 44%, apical inferior infarct. No ischemia.  . Hyperlipidemia   . Hypertension   . Ischemic cardiomyopathy    a. echo 7/19: EF 35-40%, HK of the inf wall, Gr1DD, nl sized LA, RVSF nl, PASP nl, challenging image quality  . Obesity   . OSA (obstructive sleep apnea) 03/27/2014  . PAF (paroxysmal atrial fibrillation) (HCC)    a. noted during [facility] 01/23/18 during inferior STEMI; b. resolved with IV amiodarone; c. not placed on anticoagulation given brief episode, acute illness, and need for DAPT  . PVC's (premature ventricular contractions)    a. 02/2018 Holter: Frq PVC's. No afib. Beta blocker titrated.  . VF (ventricular fibrillation) (HCC)    a. noted during [facility] 01/23/2018 s/p defibrillation x 2   Past Surgical History:  Procedure Laterality Date  . CARDIAC CATHETERIZATION  03/12/2012  . CORONARY ANGIOPLASTY  03/12/2012   s/p drug eluting stent to the mid LAD : 3.5 x 38 mm Xience drug-eluting stent. Post dilated to 4.0 in mid segment and 4.5 proximally.  Rosalie Doctor ACUTE MI REVASCULARIZATION N/A 01/23/2018   Procedure: Coronary/Graft Acute MI Revascularization;  Surgeon: Marcina Millard, MD;  Location: [facility] INVASIVE CV LAB;  Service: Cardiovascular;  Laterality: N/A;  . LEFT HEART CATH  AND CORONARY ANGIOGRAPHY N/A 01/23/2018   Procedure: LEFT HEART CATH AND CORONARY ANGIOGRAPHY;  Surgeon: Marcina Millard, MD;  Location: [facility] INVASIVE CV LAB;  Service: Cardiovascular;  Laterality: N/A;    Allergies  Allergies  Allergen Reactions  . Atorvastatin Other (See Comments)    Muscle cramps Muscle cramps    History of Present Illness    57 year old male with the above complex past medical history including CAD, hypertension, hyperlipidemia, ischemic cardia myopathy, HFrEF, obesity, and sleep apnea.  In August 2013, he suffered a non-STEMI in the setting of severe mid LAD disease requiring drug-eluting stent placement.  More recently, in early July, he presented to [facility] with inferior STEMI.  He was found to have an occluded right coronary artery and underwent drug-eluting stent placement.  Case was complicated by VF arrest x2 and also a brief episode of paroxysmal atrial fibrillation requiring amiodarone.  He did have moderate in-stent restenosis in the LAD which was medically managed.  Echocardiogram showed an EF of 35 to 40% with inferior hypokinesis and grade 1 diastolic dysfunction.  RV function was normal.  He was last seen in clinic in mid July, at which time he was doing well.  In the setting of residual LAD disease, he was set up for a Lexiscan Myoview which was performed on August 7 and showed an EF of 44% with a small defect of mild severity in the apical inferior location  suggestive of prior infarct.  No ischemia was noted.  He also wore a Holter monitor following his last appointment and this showed frequent PVCs.  In that setting, his beta-blocker was increased to 25 mg twice daily.  With this increase, he has had less frequent palpitations/PVCs.  He has not had any chest pain.  He denies dyspnea on exertion but notes occasional vague dyspnea, which he has previously associated with Brilinta.  He says it is not so bothersome that he would like to come off of  Brilinta at this point.  He has been working full-time without any significant symptoms or limitations.  His weight is up 9 pounds since hospital discharge but he says he has been eating quite a bit more and has not had any issues with PND, orthopnea, dizziness, syncope, edema, or early satiety.  Home Medications    Prior to Admission medications   Medication Sig Start Date End Date Taking? Authorizing Provider  acetaminophen (TYLENOL) 650 MG CR tablet Take 650 mg by mouth as needed.   Yes [provider]  ALPRAZolam Prudy Feeler) 0.5 MG tablet TAKE ONE TABLET BY MOUTH AT BEDTIME AS NEEDED FOR ANXIETY 02/18/18  Yes Lorre Munroe, NP  aspirin 81 MG chewable tablet Chew 1 tablet (81 mg total) by mouth daily. 01/26/18  Yes Dunn, Raymon Mutton, PA-C  carvedilol (COREG) 25 MG tablet Take 1 tablet (25 mg total) by mouth 2 (two) times daily with a meal. 03/01/18  Yes Iran Ouch, MD  rosuvastatin (CRESTOR) 10 MG tablet Take 1 tablet (10 mg total) by mouth daily at 6 PM. 01/25/18  Yes Dunn, Raymon Mutton, PA-C  spironolactone (ALDACTONE) 25 MG tablet Take 0.5 tablets (12.5 mg total) by mouth daily. 01/26/18  Yes Dunn, Raymon Mutton, PA-C  ticagrelor (BRILINTA) 90 MG TABS tablet Take 1 tablet (90 mg total) by mouth 2 (two) times daily. 02/05/18  Yes Dunn, Raymon Mutton, PA-C  lisinopril (PRINIVIL,ZESTRIL) 20 MG tablet Take 1 tablet (20 mg total) by mouth daily. 03/19/18 06/17/18  Creig Hines, NP    Review of Systems    Some dyspnea, typically at rest, which she associates with Brilinta.  As above, he has had some palpitations which have improved since titrating beta-blocker.  He denies chest pain, dyspnea on exertion, PND, orthopnea, dizziness, syncope, edema, or early satiety.  All other systems reviewed and are otherwise negative except as noted above.  Physical Exam    VS:  BP (!) 148/80 (BP Location: Left Arm, Patient Position: Sitting, Cuff Size: Large)   Pulse 71   Ht 5\' 10"  (1.778 m)   Wt (!) 329 lb 12.8 oz  (149.6 kg)   BMI 47.32 kg/m  , BMI Body mass index is 47.32 kg/m. GEN: Morbidly obese, in no acute distress.  HEENT: normal.  Neck: Supple, obese, difficult to gauge JVP.  No carotid bruits, or masses. Cardiac: RRR, no murmurs, rubs, or gallops. No clubbing, cyanosis, edema.  Radials/DP/PT 1+ and equal bilaterally.  Respiratory:  Respirations regular and unlabored, clear to auscultation bilaterally. GI: Obese, soft, nontender, nondistended, BS + x 4. MS: no deformity or atrophy. Skin: warm and dry, no rash. Neuro:  Strength and sensation are intact. Psych: Normal affect.  Accessory Clinical Findings    ECG -regular sinus rhythm, 71, PVC, no acute ST or T changes.  Assessment & Plan    1.  Coronary artery disease: Status post recent inferior STEMI with stenting of the RCA in early July.  He  has residual, moderate LAD disease and has been doing well without chest pain.  Stress testing was undertaken earlier this month which was nonischemic.  He remains on medical therapy including aspirin, beta-blocker, ACE inhibitor, statin, and Brilinta.  He notes some dyspnea, typically at rest, which he thinks is related to Brilinta.  It has been improving.  We discussed options for management including continuing Brilinta to see if symptoms eventually resolve versus loading with Plavix and discontinuing Brilinta.  He says for the time being he thinks it is tolerable and would like to continue Brilinta but will let us know if it becomes intolerable.  Of note, he was taking cardiac rehabilitation but because of some changes at work, he can no longer do this.  I did encourage him to walk 30 minutes daily.  2.  HFrEF/ischemic cardiomyopathy: EF 35 to 40% by echo in July.  Euvolemic on exam.  Though he has dyspnea at rest, he has not had dyspnea on exertion.  Weight is up 9 pounds but he says this is been gradual and thinks it is related to eating.  We discussed the importance of daily weights, sodium  restriction, medication compliance, and symptom reporting and he verbalizes understanding.  With elevated blood pressure, I am going to increase his lisinopril to 20 mg daily.  Plan to follow-up a complete metabolic panel next week.  He otherwise remains on beta-blocker and Spironolactone.  3.  Essential hypertension: Blood pressure elevated at 148/80 today.  He says blood pressure at home is typically greater than 130.  In the setting, I am increasing lisinopril back to 20 mg daily.  4.  Hyperlipidemia: LDL was 132 on July 3.  At that point he was not on a statin.  He has prior history of myalgias with atorvastatin, which is why he is on only low-dose rosuvastatin at this point.  I will have him follow-up a complete metabolic panel and fasting lipids next week.  5.  Morbid obesity: Patient would greatly benefit from cardiac rehabilitation but some changes to his work schedule prevent this now.  I have encouraged him to increase activity to at least 30 minutes of walking daily and we also discussed reasonable caloric restriction and weight loss goals.  I did recommend he consider using either weight watchers or a calorie counting phone application to assist in his weight loss efforts.  6.  PVCs: Improved following titration of beta-blocker.  7.  Disposition: Follow-up complete metabolic panel and lipids in 1 week.  Follow-up echo in early October.  Follow-up in clinic in approximately 2 months.   Nicolasa Duckinghristopher Berge, NP 03/19/2018, 4:39 PM

## 2018-03-20 ENCOUNTER — Telehealth: Payer: Self-pay | Admitting: Internal Medicine

## 2018-03-20 NOTE — Telephone Encounter (Signed)
Copied from CRM [phone]. Topic: General - Other >> Mar 20, 2018 12:22 PM Marylen PontoMcneil, Ja-Kwan wrote: Reason for CRM: Corrine - Case Manager asked that pt chart be updated with her contact information [phone] Option [phone] Ext [phone]

## 2018-03-22 ENCOUNTER — Other Ambulatory Visit: Payer: Self-pay | Admitting: Internal Medicine

## 2018-03-22 DIAGNOSIS — F411 Generalized anxiety disorder: Secondary | ICD-10-CM

## 2018-03-22 MED ORDER — ALPRAZOLAM 0.5 MG PO TABS: ORAL_TABLET | ORAL | 0 refills | Status: DC

## 2018-03-22 NOTE — Telephone Encounter (Signed)
Last filled 02/18/18... Please advise

## 2018-03-29 ENCOUNTER — Telehealth: Payer: Self-pay | Admitting: Cardiovascular Disease

## 2018-03-29 MED ORDER — CLOPIDOGREL BISULFATE 75 MG PO TABS: ORAL_TABLET | ORAL | 3 refills | Status: DC

## 2018-03-29 NOTE — Telephone Encounter (Signed)
S/w patient. It will cost him too much to stay on Brilinta. He was given paperwork for patient assistance already filled out by provider at his last office visit on 03/19/18.  Patient does not want to provide his financial information to the company and would like to switch to Plavix. Patient took last dose of Brilinta yesterday evening.  Discussed with Ward Givens, NP. Advised it was ok to stop the Brilinta and start Plavix. Take 4 tablets (300 mg) by mouth once today, then 1 tablet (75 mg) daily thereafter.  Patient verbalized understanding and the importance to take the first dose today as soon as he gets the medication. Rx sent to pharmacy.

## 2018-03-29 NOTE — Telephone Encounter (Signed)
Pt c/o medication issue:  1. Name of Medication: Brilinta 90 mg po BID   2. How are you currently taking this medication (dosage and times per day)? Not taking as of today   3. Are you having a reaction (difficulty breathing--STAT)? No   4. What is your medication issue? Too expensive please call with alternative out of medication

## 2018-04-23 ENCOUNTER — Ambulatory Visit (INDEPENDENT_AMBULATORY_CARE_PROVIDER_SITE_OTHER): Payer: BLUE CROSS/BLUE SHIELD

## 2018-04-23 ENCOUNTER — Other Ambulatory Visit: Payer: Self-pay

## 2018-04-23 DIAGNOSIS — I255 Ischemic cardiomyopathy: Secondary | ICD-10-CM

## 2018-04-24 ENCOUNTER — Other Ambulatory Visit: Payer: Self-pay | Admitting: Internal Medicine

## 2018-04-24 DIAGNOSIS — F411 Generalized anxiety disorder: Secondary | ICD-10-CM

## 2018-04-24 MED ORDER — ALPRAZOLAM 0.5 MG PO TABS: ORAL_TABLET | ORAL | 0 refills | Status: DC

## 2018-04-24 NOTE — Telephone Encounter (Signed)
Name of Medication:alprazolam 0.5 mg  Name of Pharmacy: [facility] [place] Last Fill or Written Date and Quantity: # 30 on 03/22/18 Last Office Visit and Type: 01/29/18 Next Office Visit and Type: none scheduled Last Controlled Substance Agreement Date: none Last ZOX:[id number]

## 2018-05-27 ENCOUNTER — Other Ambulatory Visit: Payer: Self-pay | Admitting: Physician Assistant

## 2018-05-27 ENCOUNTER — Other Ambulatory Visit: Payer: Self-pay | Admitting: Internal Medicine

## 2018-05-27 DIAGNOSIS — F411 Generalized anxiety disorder: Secondary | ICD-10-CM

## 2018-05-27 NOTE — Telephone Encounter (Signed)
Last filled 04/24/2018... Please advise 

## 2018-05-27 NOTE — Telephone Encounter (Signed)
He seems to be taking this daily. Not comfortable with this due to its addictive properties. Is he interested in going back on the Celexa or similar med?

## 2018-05-28 ENCOUNTER — Ambulatory Visit (INDEPENDENT_AMBULATORY_CARE_PROVIDER_SITE_OTHER): Payer: BLUE CROSS/BLUE SHIELD | Admitting: Cardiovascular Disease

## 2018-05-28 ENCOUNTER — Encounter: Payer: Self-pay | Admitting: Cardiovascular Disease

## 2018-05-28 VITALS — BP 130/84 | HR 61 | Ht 70.0 in | Wt 337.8 lb

## 2018-05-28 DIAGNOSIS — I10 Essential (primary) hypertension: Secondary | ICD-10-CM

## 2018-05-28 DIAGNOSIS — E785 Hyperlipidemia, unspecified: Secondary | ICD-10-CM | POA: Diagnosis not present

## 2018-05-28 DIAGNOSIS — I5022 Chronic systolic (congestive) heart failure: Secondary | ICD-10-CM | POA: Diagnosis not present

## 2018-05-28 DIAGNOSIS — I2510 Atherosclerotic heart disease of native coronary artery without angina pectoris: Secondary | ICD-10-CM | POA: Diagnosis not present

## 2018-05-28 NOTE — Telephone Encounter (Signed)
Will continue # 30, but will only be able to get filled every 3 months.

## 2018-05-28 NOTE — Patient Instructions (Signed)

## 2018-05-28 NOTE — Telephone Encounter (Signed)
Pt states he does not want to restart Celexa or any other SSRI due to side effects... Pt is aware and okay if you reduce quantity and/or the amount of days in between refills... Please advise

## 2018-05-28 NOTE — Progress Notes (Signed)
Cardiology Office Note   Date:  05/28/2018   ID:  Steve Burnett, DOB 14-Mar-1961, MRN [id number]  PCP:  Lorre Munroe, NP  Cardiologist:   Lorine Bears, MD   Chief Complaint  Patient presents with  . OTHER    2 month f/u echo c/o headaches last couple days. Meds reviewed verbally with pt.      History of Present Illness: Steve Burnett is a 57 y.o. male who presents for a follow-up visit regarding coronary artery disease. He had non-ST elevation myocardial infarction in August 2013. Cardiac catheterization showed severe single-vessel coronary artery disease in the left anterior descending artery. There was a 60% proximal stenosis followed by diffuse 80% disease in the midsegment and a 70% discrete stenosis distally. He underwent an angioplasty and long drug-eluting stent placement to the proximal and mid LAD without complications. The distal LAD lesion was left to be treated medically.  He has chronic medical conditions that include morbid obesity, essential hypertension and hyperlipidemia. He was hospitalized in July of this year with acute inferior ST elevation myocardial infarction complicated by ventricular fibrillation.  Cardiac catheterization showed occluded proximal RCA which was treated successfully with PCI and drug-eluting stent placement.  There was residual 70 to 75% in-stent restenosis in the proximal LAD. He subsequently underwent a Lexiscan Myoview which showed no evidence of anterior wall ischemia. Most recent echocardiogram last month showed an EF of 45 to 50% with grade 2 diastolic dysfunction. The patient felt better after switching from Brilinta to Plavix.  He reports no recent chest pain, shortness of breath or palpitations. Unfortunately, he only attended cardiac rehab for 2 weeks due to his work schedule.  His weight continues to increase.  He admits to poor choices with diet.  Past Medical History:  Diagnosis Date  . Anxiety   . Chicken pox   . Coronary  artery disease    a. 02/2012 NSTEMI/Cath/PCI: pLAD 60%, mLAD 80%, dLAD 70, nonobs LCX/RCA --> DES to prox/mid LAD; b. inf STEMI 01/23/18: ost/proxLAD 75% at the prox edge of the previously placed stent, mLAD 75%, OM1 50%, OM2 50%, pRCA 100% s/p PCI/DES, rPLA 70%; c. 02/2018 MV: Ef 44%, apical inferior infarct. No ischemia.  . Hyperlipidemia   . Hypertension   . Ischemic cardiomyopathy    a. echo 7/19: EF 35-40%, HK of the inf wall, Gr1DD, nl sized LA, RVSF nl, PASP nl, challenging image quality  . Obesity   . OSA (obstructive sleep apnea) 03/27/2014  . PAF (paroxysmal atrial fibrillation) (HCC)    a. noted during [facility] 01/23/18 during inferior STEMI; b. resolved with IV amiodarone; c. not placed on anticoagulation given brief episode, acute illness, and need for DAPT  . PVC's (premature ventricular contractions)    a. 02/2018 Holter: Frq PVC's. No afib. Beta blocker titrated.  . VF (ventricular fibrillation) (HCC)    a. noted during [facility] 01/23/2018 s/p defibrillation x 2    Past Surgical History:  Procedure Laterality Date  . CARDIAC CATHETERIZATION  03/12/2012  . CORONARY ANGIOPLASTY  03/12/2012   s/p drug eluting stent to the mid LAD : 3.5 x 38 mm Xience drug-eluting stent. Post dilated to 4.0 in mid segment and 4.5 proximally.  Rosalie Doctor ACUTE MI REVASCULARIZATION N/A 01/23/2018   Procedure: Coronary/Graft Acute MI Revascularization;  Surgeon: Marcina Millard, MD;  Location: [facility] INVASIVE CV LAB;  Service: Cardiovascular;  Laterality: N/A;  . LEFT HEART CATH AND CORONARY ANGIOGRAPHY N/A 01/23/2018   Procedure: LEFT HEART CATH AND  CORONARY ANGIOGRAPHY;  Surgeon: Marcina Millard, MD;  Location: [facility] INVASIVE CV LAB;  Service: Cardiovascular;  Laterality: N/A;     Current Outpatient Medications  Medication Sig Dispense Refill  . acetaminophen (TYLENOL) 650 MG CR tablet Take 650 mg by mouth as needed.    . ALPRAZolam (XANAX) 0.5 MG tablet TAKE ONE TABLET BY MOUTH AT BEDTIME AS NEEDED FOR  ANXIETY 30 tablet 0  . aspirin 81 MG chewable tablet Chew 1 tablet (81 mg total) by mouth daily. 30 tablet 11  . carvedilol (COREG) 25 MG tablet Take 1 tablet (25 mg total) by mouth 2 (two) times daily with a meal. 180 tablet 3  . clopidogrel (PLAVIX) 75 MG tablet Take 4 tablets (300 mg) by mouth today x1, then take 1 tablet (75 mg) by mouth once a day thereafter. 90 tablet 3  . lisinopril (PRINIVIL,ZESTRIL) 20 MG tablet Take 1 tablet (20 mg total) by mouth daily. 90 tablet 3  . rosuvastatin (CRESTOR) 10 MG tablet TAKE 1 TABLET BY MOUTH EVERY DAY AT 6PM 30 tablet 2  . spironolactone (ALDACTONE) 25 MG tablet Take 0.5 tablets (12.5 mg total) by mouth daily. 30 tablet 5   No current facility-administered medications for this visit.     Allergies:   Atorvastatin    Social History:  The patient  reports that he has never smoked. He has never used smokeless tobacco. He reports that he drinks about 2.0 standard drinks of alcohol per week. He reports that he does not use drugs.   Family History:  The patient's family history includes Heart attack (age of onset: 67) in his mother; Heart disease in his mother; Heart disease (age of onset: 48) in his sister; Hyperlipidemia in his mother; Hypertension in his mother; Stroke in his father.    ROS:  Please see the history of present illness.   Otherwise, review of systems are positive for none.   All other systems are reviewed and negative.    PHYSICAL EXAM: VS:  BP 130/84 (BP Location: Left Arm, Patient Position: Sitting, Cuff Size: Large)   Pulse 61   Ht 5\' 10"  (1.778 m)   Wt (!) 337 lb 12 oz (153.2 kg)   BMI 48.46 kg/m  , BMI Body mass index is 48.46 kg/m. GEN: Well nourished, well developed, in no acute distress  HEENT: normal  Neck: no JVD, carotid bruits, or masses Cardiac: RRR; no murmurs, rubs, or gallops,no edema  Respiratory:  clear to auscultation bilaterally, normal work of breathing GI: soft, nontender, nondistended, + BS MS: no  deformity or atrophy  Skin: warm and dry, no rash Neuro:  Strength and sensation are intact Psych: euthymic mood, full affect   EKG:  EKG is ordered today. The ekg ordered today demonstrates normal sinus rhythm with low voltage.   Recent Labs: 01/23/2018: Magnesium 2.2; TSH 0.964 02/18/2018: ALT 15; BUN 13; Creatinine, Ser 0.75; Hemoglobin 13.4; Platelets 254.0; Potassium 4.2; Sodium 139    Lipid Panel    Component Value Date/Time   CHOL 204 (H) 01/23/2018 0847   CHOL 149 01/15/2014 0826   CHOL 167 03/11/2012 0645   TRIG 144 01/23/2018 0847   TRIG 98 03/11/2012 0645   HDL 43 01/23/2018 0847   HDL 41 01/15/2014 0826   HDL 41 03/11/2012 0645   CHOLHDL 4.7 01/23/2018 0847   VLDL 29 01/23/2018 0847   VLDL 20 03/11/2012 0645   LDLCALC 132 (H) 01/23/2018 0847   LDLCALC 83 01/15/2014 0826   LDLCALC 106 (  H) 03/11/2012 0645   LDLDIRECT 79.0 08/08/2016 1540      Wt Readings from Last 3 Encounters:  05/28/18 (!) 337 lb 12 oz (153.2 kg)  03/19/18 (!) 329 lb 12.8 oz (149.6 kg)  02/18/18 (!) 318 lb 14.4 oz (144.7 kg)      No flowsheet data found.    ASSESSMENT AND PLAN:  1.  Coronary artery disease involving native coronary arteries without angina: Doing reasonably well overall.  Continue medical therapy.  Continue dual antiplatelet therapy for at least another 9 months but preferably long-term.  He does have in-stent restenosis in the LAD but currently with no anginal symptoms.  2. Hyperlipidemia: He has history of intolerance to statins but have been tolerating rosuvastatin 10 mg daily.  He has not been able to get his follow-up lipid and liver profile.  3. Essential hypertension: Blood pressure is not well controlled on current medications.  4.  Chronic systolic heart failure due to ischemic cardiomyopathy: Most recent ejection fraction improved to 45 to 50%.  Continue treatment with lisinopril and carvedilol.  5.  Morbid obesity: I discussed with him the importance of  healthy diet and regular exercise.    6. Obstructive sleep apnea: He is compliant with CPAP treatment.  Disposition:   FU with me in 6 months  Signed,  Lorine Bears, MD  05/28/2018 4:30 PM    [facility]

## 2018-05-29 MED ORDER — ALPRAZOLAM 0.5 MG PO TABS: 0.5000 mg | ORAL_TABLET | Freq: Every evening | ORAL | 0 refills | Status: DC | PRN

## 2018-05-30 ENCOUNTER — Telehealth: Payer: Self-pay | Admitting: *Deleted

## 2018-05-30 ENCOUNTER — Encounter: Payer: Self-pay | Admitting: *Deleted

## 2018-05-30 ENCOUNTER — Other Ambulatory Visit: Payer: Self-pay

## 2018-05-30 ENCOUNTER — Emergency Department (INDEPENDENT_AMBULATORY_CARE_PROVIDER_SITE_OTHER)
Admission: EM | Admit: 2018-05-30 | Discharge: 2018-05-30 | Disposition: A | Discharge status: Home / Self Care | Payer: BLUE CROSS/BLUE SHIELD | Source: Home / Self Care | Attending: Family Medicine | Admitting: Family Medicine

## 2018-05-30 ENCOUNTER — Emergency Department (INDEPENDENT_AMBULATORY_CARE_PROVIDER_SITE_OTHER): Payer: BLUE CROSS/BLUE SHIELD

## 2018-05-30 DIAGNOSIS — M25571 Pain in right ankle and joints of right foot: Secondary | ICD-10-CM

## 2018-05-30 DIAGNOSIS — M542 Cervicalgia: Secondary | ICD-10-CM | POA: Diagnosis not present

## 2018-05-30 DIAGNOSIS — S301XXA Contusion of abdominal wall, initial encounter: Secondary | ICD-10-CM | POA: Diagnosis not present

## 2018-05-30 DIAGNOSIS — M545 Low back pain, unspecified: Secondary | ICD-10-CM

## 2018-05-30 DIAGNOSIS — S161XXA Strain of muscle, fascia and tendon at neck level, initial encounter: Secondary | ICD-10-CM | POA: Diagnosis not present

## 2018-05-30 DIAGNOSIS — S199XXA Unspecified injury of neck, initial encounter: Secondary | ICD-10-CM | POA: Diagnosis not present

## 2018-05-30 DIAGNOSIS — S99911A Unspecified injury of right ankle, initial encounter: Secondary | ICD-10-CM | POA: Diagnosis not present

## 2018-05-30 MED ORDER — IBUPROFEN 600 MG PO TABS: 600.0000 mg | ORAL_TABLET | Freq: Four times a day (QID) | ORAL | 0 refills | Status: DC | PRN

## 2018-05-30 MED ORDER — IBUPROFEN 600 MG PO TABS
600.0000 mg | ORAL_TABLET | Freq: Once | ORAL | Status: AC
  Administered 2018-05-30: 600 mg via ORAL

## 2018-05-30 MED ORDER — CYCLOBENZAPRINE HCL 5 MG PO TABS: 5.0000 mg | ORAL_TABLET | Freq: Two times a day (BID) | ORAL | 0 refills | Status: DC | PRN

## 2018-05-30 NOTE — Discharge Instructions (Signed)
°  You may take 500mg acetaminophen every 4-6 hours or in combination with ibuprofen 400-600mg every 6-8 hours as needed for pain and inflammation. ° °Flexeril (cyclobenzaprine) is a muscle relaxer and may cause drowsiness. Do not drink alcohol, drive, or operate heavy machinery while taking. ° °Please follow up with family medicine in 1 week if not improving.  ° °
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
## 2018-05-30 NOTE — Telephone Encounter (Signed)
Noted. To ER if worse before tomorrow.

## 2018-05-30 NOTE — Telephone Encounter (Signed)
I will see him there
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
## 2018-05-31 ENCOUNTER — Emergency Department (INDEPENDENT_AMBULATORY_CARE_PROVIDER_SITE_OTHER)
Admission: EM | Admit: 2018-05-31 | Discharge: 2018-05-31 | Disposition: A | Discharge status: Home / Self Care | Payer: BLUE CROSS/BLUE SHIELD | Source: Home / Self Care | Attending: Family Medicine | Admitting: Family Medicine

## 2018-05-31 ENCOUNTER — Ambulatory Visit: Payer: BLUE CROSS/BLUE SHIELD | Admitting: Family Medicine

## 2018-05-31 ENCOUNTER — Emergency Department (INDEPENDENT_AMBULATORY_CARE_PROVIDER_SITE_OTHER): Payer: BLUE CROSS/BLUE SHIELD

## 2018-05-31 ENCOUNTER — Encounter: Payer: Self-pay | Admitting: *Deleted

## 2018-05-31 DIAGNOSIS — M546 Pain in thoracic spine: Secondary | ICD-10-CM

## 2018-05-31 DIAGNOSIS — S63502A Unspecified sprain of left wrist, initial encounter: Secondary | ICD-10-CM | POA: Diagnosis not present

## 2018-05-31 DIAGNOSIS — M25532 Pain in left wrist: Secondary | ICD-10-CM | POA: Diagnosis not present

## 2018-05-31 DIAGNOSIS — S6992XA Unspecified injury of left wrist, hand and finger(s), initial encounter: Secondary | ICD-10-CM | POA: Diagnosis not present

## 2018-05-31 DIAGNOSIS — S29012A Strain of muscle and tendon of back wall of thorax, initial encounter: Secondary | ICD-10-CM

## 2018-05-31 DIAGNOSIS — S299XXA Unspecified injury of thorax, initial encounter: Secondary | ICD-10-CM | POA: Diagnosis not present

## 2018-05-31 HISTORY — DX: Heart failure, unspecified: I50.9
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
## 2018-05-31 NOTE — Discharge Instructions (Addendum)
Wear wrist splint.  Apply ice pack to left wrist and rhomboid muscles of back for 20 to 30 minutes, 3 to 4 times daily  Continue until pain and swelling decrease.  Begin range of motion and stretching exercises as tolerated.
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
## 2018-06-06 ENCOUNTER — Encounter: Payer: Self-pay | Admitting: Family Medicine

## 2018-06-06 ENCOUNTER — Ambulatory Visit (INDEPENDENT_AMBULATORY_CARE_PROVIDER_SITE_OTHER): Payer: BLUE CROSS/BLUE SHIELD | Admitting: Family Medicine

## 2018-06-06 VITALS — BP 188/112 | HR 76 | Ht 70.0 in | Wt 336.0 lb

## 2018-06-06 DIAGNOSIS — I10 Essential (primary) hypertension: Secondary | ICD-10-CM

## 2018-06-06 DIAGNOSIS — M542 Cervicalgia: Secondary | ICD-10-CM | POA: Diagnosis not present

## 2018-06-06 DIAGNOSIS — M7918 Myalgia, other site: Secondary | ICD-10-CM | POA: Diagnosis not present

## 2018-06-06 DIAGNOSIS — S39012A Strain of muscle, fascia and tendon of lower back, initial encounter: Secondary | ICD-10-CM | POA: Diagnosis not present
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
## 2018-06-07 ENCOUNTER — Encounter: Payer: Self-pay | Admitting: Family Medicine

## 2018-06-11 ENCOUNTER — Encounter: Payer: Self-pay | Admitting: Rehabilitative and Restorative Service Providers"

## 2018-06-11 ENCOUNTER — Ambulatory Visit (INDEPENDENT_AMBULATORY_CARE_PROVIDER_SITE_OTHER): Payer: BLUE CROSS/BLUE SHIELD | Admitting: Rehabilitative and Restorative Service Providers"

## 2018-06-11 DIAGNOSIS — M545 Low back pain, unspecified: Secondary | ICD-10-CM

## 2018-06-11 DIAGNOSIS — R293 Abnormal posture: Secondary | ICD-10-CM

## 2018-06-11 DIAGNOSIS — M546 Pain in thoracic spine: Secondary | ICD-10-CM | POA: Diagnosis not present

## 2018-06-11 DIAGNOSIS — M542 Cervicalgia: Secondary | ICD-10-CM

## 2018-06-11 DIAGNOSIS — R29898 Other symptoms and signs involving the musculoskeletal system: Secondary | ICD-10-CM
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
## 2018-06-11 NOTE — Patient Instructions (Signed)
Trunk: Prone Extension (Press-Ups)    Lie on stomach on firm, flat surface. Relax bottom and legs. Raise chest in air with elbows straight. Keep hips flat on surface, sag stomach. Hold __2-3__ seconds. Repeat __10__ times. Do __2__ sessions per day. CAUTION: Movement should be gentle and slow.  Backward Bend (Standing)    Arch backward to make hollow of back deeper. Hold _1-2 ___ seconds. Repeat _1-3___ times per set.  Do _several___ sessions per day.   Scapula Adduction With Pectoralis Stretch: Low - Standing   Shoulders at 45 hands even with shoulders, keeping weight through legs, shift weight forward until you feel pull or stretch through the front of your chest. Hold _30__ seconds. Do _3__ times, _2-4__ times per day.   Scapula Adduction With Pectoralis Stretch: Mid-Range - Standing   Shoulders at 90 elbows even with shoulders, keeping weight through legs, shift weight forward until you feel pull or strength through the front of your chest. Hold __30_ seconds. Do _3__ times, __2-4_ times per day.   Scapula Adduction With Pectoralis Stretch: High - Standing   Shoulders at 120 hands up high on the doorway, keeping weight on feet, shift weight forward until you feel pull or stretch through the front of your chest. Hold _30__ seconds. Do _3__ times, _2-3__ times per day.  Axial Extension (Chin Tuck)    Pull chin in and lengthen back of neck. Hold __5__ seconds while counting out loud. Repeat __10__ times. Do __several__ sessions per day.  Shoulder Blade Squeeze Can use swim noodle for tactile cues    Rotate shoulders back, then squeeze shoulder blades down and back . Hold 10 sec Repeat __10__ times. Do _several ___ sessions per day.  Upper Back Strength: Lower Trapezius / Rotator Cuff " L's "     Arms in waitress pose, palms up. Press hands back and slide shoulder blades down. Hold for __5__ seconds. Repeat _10___ times. 1-2 times per day.    Scapular  Retraction: Elbow Flexion (Standing)  "W's"     With elbows bent to 90, pinch shoulder blades together and rotate arms out, keeping elbows bent. Repeat __10__ times per set. Do __1-2__ sets per session. Do _several ___ sessions per day.  TENS UNIT: This is helpful for muscle pain and spasm.   Search and Purchase a TENS 7000 2nd edition at www.tenspros.com. It should be less than $30.     TENS unit instructions: Do not shower or bathe with the unit on Turn the unit off before removing electrodes or batteries If the electrodes lose stickiness add a drop of water to the electrodes after they are disconnected from the unit and place on plastic sheet. If you continued to have difficulty, call the TENS unit company to purchase more electrodes. Do not apply lotion on the skin area prior to use. Make sure the skin is clean and dry as this will help prolong the life of the electrodes. After use, always check skin for unusual red areas, rash or other skin difficulties. If there are any skin problems, does not apply electrodes to the same area. Never remove the electrodes from the unit by pulling the wires. Do not use the TENS unit or electrodes other than as directed. Do not change electrode placement without consultating your therapist or physician. Keep 2 fingers with between each electrode.      [facility] [address] [place], [place] [address]  [phone] (office) [phone] (fax)

## 2018-06-13 ENCOUNTER — Other Ambulatory Visit: Payer: Self-pay | Admitting: Family Medicine

## 2018-06-13 ENCOUNTER — Ambulatory Visit (INDEPENDENT_AMBULATORY_CARE_PROVIDER_SITE_OTHER): Payer: BLUE CROSS/BLUE SHIELD | Admitting: Family Medicine

## 2018-06-13 ENCOUNTER — Encounter: Payer: Self-pay | Admitting: Family Medicine

## 2018-06-13 VITALS — BP 161/93 | HR 68 | Temp 98.2°F | Wt 339.8 lb

## 2018-06-13 DIAGNOSIS — M7918 Myalgia, other site: Secondary | ICD-10-CM

## 2018-06-13 DIAGNOSIS — I25119 Atherosclerotic heart disease of native coronary artery with unspecified angina pectoris: Secondary | ICD-10-CM

## 2018-06-13 DIAGNOSIS — S39012A Strain of muscle, fascia and tendon of lower back, initial encounter: Secondary | ICD-10-CM | POA: Diagnosis not present

## 2018-06-13 DIAGNOSIS — I10 Essential (primary) hypertension: Secondary | ICD-10-CM

## 2018-06-13 DIAGNOSIS — E7800 Pure hypercholesterolemia, unspecified: Secondary | ICD-10-CM | POA: Diagnosis not present

## 2018-06-13 DIAGNOSIS — S060X0D Concussion without loss of consciousness, subsequent encounter: Secondary | ICD-10-CM

## 2018-06-13 MED ORDER — PITAVASTATIN CALCIUM 4 MG PO TABS: 1.0000 | ORAL_TABLET | Freq: Every day | ORAL | 3 refills | Status: DC

## 2018-06-13 MED ORDER — CYCLOBENZAPRINE HCL 5 MG PO TABS: 5.0000 mg | ORAL_TABLET | Freq: Two times a day (BID) | ORAL | 0 refills | Status: DC | PRN

## 2018-06-13 MED ORDER — LISINOPRIL 40 MG PO TABS: 40.0000 mg | ORAL_TABLET | Freq: Every day | ORAL | 0 refills | Status: DC

## 2018-06-13 NOTE — Patient Instructions (Addendum)
Thank you for coming in today. I think the headache will likely improve.  Tylenol is ok.  Let me know if not better.   I think the concussion is improving as it typically dose for most people.   Continue PT for muscle pain.   Increase lisinopril to 40mg  daily  STOP crestor  Start livalo.   We will need to get labs in a few weeks to check on kidney and potassium  Recheck with me in 3-4 weeks to follow up concussion, blood pressure and cholesterol.   Pitavastatin oral tablets What is this medicine? PITAVASTATIN (pit A va STAT in) is known as a HMG-CoA reductase inhibitor or 'statin'. It lowers the level of cholesterol and triglycerides in the blood. Diet and lifestyle changes are often used with this drug. This medicine may be used for other purposes; ask your health care provider or pharmacist if you have questions. COMMON BRAND NAME(S): Livalo What should I tell my health care provider before I take this medicine? They need to know if you have any of these conditions: - frequently drink alcoholic beverages - kidney disease - liver disease - muscle aches or weakness - other medical condition - an unusual or allergic reaction to pitavastatin, other medicines, foods, dyes, or preservatives - pregnant or trying to get pregnant - breast-feeding How should I use this medicine? Take this medicine by mouth with a glass of water. Follow the directions on the prescription label. You can take this medicine with or without food. Take your doses at regular intervals. Do not take your medicine more often than directed. Talk to your pediatrician regarding the use of this medicine in children. Special care may be needed. Overdosage: If you think you have taken too much of this medicine contact a poison control center or emergency room at once. NOTE: This medicine is only for you. Do not share this medicine with others. What if I miss a dose? If you miss a dose, take it as soon as you can. If  it is almost time for your next dose, take only that dose. Do not take double or extra doses. What may interact with this medicine? Do not take this medicine with any of the following medications: - cyclosporine - gemfibrozil - herbal medicines like red yeast rice This medicine may also interact with the following medications: - alcohol - antiviral medicines for HIV or AIDS - erythromycin - other medicines for cholesterol - rifampin - warfarin This list may not describe all possible interactions. Give your health care provider a list of all the medicines, herbs, non-prescription drugs, or dietary supplements you use. Also tell them if you smoke, drink alcohol, or use illegal drugs. Some items may interact with your medicine. What should I watch for while using this medicine? Visit your doctor or health care professional for regular check-ups. You may need regular tests to make sure your liver is working properly. Tell your doctor or health care professional right away if you get any unexplained muscle pain, tenderness, or weakness, especially if you also have a fever and tiredness. Your doctor or health care professional may tell you to stop taking this medicine if you develop muscle problems. If your muscle problems do not go away after stopping this medicine, contact your health care professional. This medicine may affect blood sugar levels. If you have diabetes, check with your doctor or health care professional before you change your diet or the dose of your diabetic medicine. This drug is only  part of a total heart-health program. Your doctor or a dietician can suggest a low-cholesterol and low-fat diet to help. Avoid alcohol and smoking, and keep a proper exercise schedule. Do not become pregnant while taking this medicine. Women should inform their doctor if they wish to become pregnant or think they might be pregnant. There is a potential for serious side effects to an unborn child. Do not  breast-feed an infant while taking this medicine. Serious side effects to an unborn child or to an infant are possible. Talk to your doctor or pharmacist for more information. What side effects may I notice from receiving this medicine? Side effects that you should report to your doctor or health care professional as soon as possible: - allergic reactions like skin rash, itching or hives, swelling of the face, lips, or tongue - dark urine - fever - joint pain - muscle cramps or pain - redness, blistering, peeling or loosening of the skin, including inside the mouth - trouble passing urine or change in the amount of urine - unusually weak or tired - yellowing of the eyes or skin Side effects that usually do not require medical attention (report to your doctor or health care professional if they continue or are bothersome): - constipation - heartburn - nausea This list may not describe all possible side effects. Call your doctor for medical advice about side effects. You may report side effects to FDA at 1-800-FDA-1088. Where should I keep my medicine? Keep out of the reach of children. Store at room temperature between 15 and 30 degrees C (59 and 86 degrees F). Protect from light. Throw away any unused medicine after the expiration date. NOTE: This sheet is a summary. It may not cover all possible information. If you have questions about this medicine, talk to your doctor, pharmacist, or health care provider.  2018 Elsevier/Gold Standard (2015-08-12 16:10:9612:24:24)
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
## 2018-06-17 NOTE — Telephone Encounter (Signed)
Please see change in medication. Fleurette Woolbright,CMA

## 2018-06-18 ENCOUNTER — Other Ambulatory Visit: Payer: Self-pay | Admitting: Family Medicine

## 2018-06-19 NOTE — Telephone Encounter (Signed)
Francesco RunnerHolden do you initiate a PA or the pharmacy? Dewitt Judice,CMA

## 2018-06-19 NOTE — Telephone Encounter (Signed)
Pharmacy is requesting an alternative. Please see below. Keiland Pickering,CMA

## 2018-06-19 NOTE — Telephone Encounter (Signed)
Pharmacy will initiate PA.

## 2018-06-19 NOTE — Telephone Encounter (Signed)
Will have to do prior auth. Please inform pt and pharmacist. Pt failed 2+ statins including crestor.

## 2018-07-04 ENCOUNTER — Ambulatory Visit: Payer: BLUE CROSS/BLUE SHIELD | Admitting: Family Medicine

## 2018-07-11 ENCOUNTER — Telehealth: Payer: Self-pay | Admitting: Cardiovascular Disease

## 2018-07-11 DIAGNOSIS — I493 Ventricular premature depolarization: Secondary | ICD-10-CM

## 2018-07-11 NOTE — Telephone Encounter (Signed)
Left a message for the patient to call back.  

## 2018-07-11 NOTE — Telephone Encounter (Signed)
Pt c/o of Chest Pain: STAT if CP now or developed within 24 hours  1. Are you having CP right now? Yes discomfort tenderness   2. Are you experiencing any other symptoms (ex. SOB, nausea, vomiting, sweating)? Lightheaded doesn't feel right like when having pvcs pulse doesn't fel normal   3. How long have you been experiencing CP? Noticed discomfort x 2 days  4. Is your CP continuous or coming and going? On and off   5. Have you taken Nitroglycerin? No  ?

## 2018-07-15 NOTE — Telephone Encounter (Signed)
He needs a repeat 48-hour Holter monitor or a 3-day ZIO patch monitor.

## 2018-07-15 NOTE — Telephone Encounter (Signed)
Left a message for the patient to call back.  

## 2018-07-15 NOTE — Telephone Encounter (Signed)
Returned the call to the patient. He stated that his PVC's are getting worse and are causing him to be light headed.  He denies shortness of breath and chest pain. They are occurring on a daily basis and normally after he eats.The only caffeine he drinks is his coffee in the morning.   He currently takes carvedilol 25 mg bid.   He was offered an appointment for follow up but is currently out of town for the holidays.

## 2018-07-18 NOTE — Telephone Encounter (Signed)
Left a message for the patient to call back.  

## 2018-07-26 NOTE — Telephone Encounter (Signed)
The patient has agreed to wear a 3 day Zio monitor. Orders have been placed and message sent to scheduling.

## 2018-08-13 ENCOUNTER — Telehealth: Payer: Self-pay | Admitting: Cardiovascular Disease

## 2018-08-13 NOTE — Telephone Encounter (Signed)
Contacted patient to scheduled ZIO Patient states he does not have time that he can take off of work Does not wish to schedule Please advise

## 2018-08-14 NOTE — Telephone Encounter (Signed)
Left a message for the patient to call back if he changed his mind about the monitor or if he had any further needs. He has also been informed to go to the ED if he has any chest pain.

## 2018-08-14 NOTE — Telephone Encounter (Signed)
Left a message for the patient to call back.  

## 2018-08-21 ENCOUNTER — Other Ambulatory Visit: Payer: Self-pay | Admitting: Cardiovascular Disease

## 2018-08-26 ENCOUNTER — Other Ambulatory Visit: Payer: Self-pay | Admitting: Internal Medicine

## 2018-08-26 DIAGNOSIS — F411 Generalized anxiety disorder: Secondary | ICD-10-CM

## 2018-08-27 MED ORDER — ALPRAZOLAM 0.5 MG PO TABS: 0.5000 mg | ORAL_TABLET | Freq: Every evening | ORAL | 0 refills | Status: DC | PRN

## 2018-08-27 NOTE — Telephone Encounter (Signed)
Last filled 05/29/18... please advise

## 2018-12-11 ENCOUNTER — Other Ambulatory Visit: Payer: Self-pay | Admitting: Internal Medicine

## 2018-12-11 DIAGNOSIS — F411 Generalized anxiety disorder: Secondary | ICD-10-CM

## 2018-12-12 MED ORDER — ALPRAZOLAM 0.5 MG PO TABS: 0.5000 mg | ORAL_TABLET | Freq: Every evening | ORAL | 0 refills | Status: DC | PRN

## 2018-12-12 NOTE — Telephone Encounter (Signed)
Last filled 08/27/2018.Marland KitchenMarland KitchenMarland Kitchen please advise

## 2018-12-25 ENCOUNTER — Telehealth: Payer: Self-pay

## 2018-12-25 NOTE — Telephone Encounter (Signed)
Virtual Visit Pre-Appointment Phone Call  "Steve Burnett, I am calling you today to discuss your upcoming appointment. We are currently trying to limit exposure to the virus that causes COVID-19 by seeing patients at home rather than in the office."  1. "What is the BEST phone number to call the day of the visit?" - include this in appointment notes  2. "Do you have or have access to (through a family member/friend) a smartphone with video capability that we can use for your visit?" a. If yes - list this number in appt notes as "cell" (if different from BEST phone #) and list the appointment type as a VIDEO visit in appointment notes b. If no - list the appointment type as a PHONE visit in appointment notes  3. Confirm consent - "In the setting of the current Covid19 crisis, you are scheduled for a video visit with your provider on 02/04/2019 at 3:20PM.  Just as we do with many in-office visits, in order for you to participate in this visit, we must obtain consent.  If you'd like, I can send this to your mychart (if signed up) or email for you to review.  Otherwise, I can obtain your verbal consent now.  All virtual visits are billed to your insurance company just like a normal visit would be.  By agreeing to a virtual visit, we'd like you to understand that the technology does not allow for your provider to perform an examination, and thus may limit your provider's ability to fully assess your condition. If your provider identifies any concerns that need to be evaluated in person, we will make arrangements to do so.  Finally, though the technology is pretty good, we cannot assure that it will always work on either your or our end, and in the setting of a video visit, we may have to convert it to a phone-only visit.  In either situation, we cannot ensure that we have a secure connection.  Are you willing to proceed?" STAFF: Did the patient verbally acknowledge consent to telehealth visit? Document YES/NO here:  YES  4. Advise patient to be prepared - "Two hours prior to your appointment, go ahead and check your blood pressure, pulse, oxygen saturation, and your weight (if you have the equipment to check those) and write them all down. When your visit starts, your provider will ask you for this information. If you have an Apple Watch or Kardia device, please plan to have heart rate information ready on the day of your appointment. Please have a pen and paper handy nearby the day of the visit as well."  5. Give patient instructions for MyChart download to smartphone OR [email or web address] as below if video visit (depending on what platform provider is using)  6. Inform patient they will receive a phone call 15 minutes prior to their appointment time (may be from unknown caller ID) so they should be prepared to answer    TELEPHONE CALL NOTE  Steve Burnett has been deemed a candidate for a follow-up tele-health visit to limit community exposure during the Covid-19 pandemic. I spoke with the patient via phone to ensure availability of phone/video source, confirm preferred email & phone number, and discuss instructions and expectations.  I reminded Steve Burnett to be prepared with any vital sign and/or heart rhythm information that could potentially be obtained via home monitoring, at the time of his visit. I reminded Steve Burnett to expect a phone call prior to his visit.  Steve Burnett  Steve Burnett 12/25/2018 10:30 AM   INSTRUCTIONS FOR DOWNLOADING THE MYCHART APP TO SMARTPHONE  - The patient must first make sure to have activated MyChart and know their login information - If Apple, go to [employer] Store and type in MyChart in the search bar and download the app. If Android, ask patient to go to [employer] Play Store and type in [place] in the search bar and download the app. The app is free but as with any other app downloads, their phone may require them to verify saved payment information or Apple/Android  password.  - The patient will need to then log into the app with their MyChart username and password, and select [facility] as their healthcare provider to link the account. When it is time for your visit, go to the MyChart app, find appointments, and click Begin Video Visit. Be sure to Select Allow for your device to access the Microphone and Camera for your visit. You will then be connected, and your provider will be with you shortly.  **If they have any issues connecting, or need assistance please contact MyChart service desk [phone])**  **If using a computer, in order to ensure the best quality for their visit they will need to use either of the following Internet Browsers: [employer] Edge, or Google Chrome**  IF USING DOXIMITY or DOXY.ME - The patient will receive a link just prior to their visit by text.     FULL LENGTH CONSENT FOR TELE-HEALTH VISIT   I hereby voluntarily request, consent and authorize [facility] and its employed or contracted physicians, physician assistants, nurse practitioners or other licensed health care professionals (the Practitioner), to provide me with telemedicine health care services (the "Services") as deemed necessary by the treating Practitioner. I acknowledge and consent to receive the Services by the Practitioner via telemedicine. I understand that the telemedicine visit will involve communicating with the Practitioner through live audiovisual communication technology and the disclosure of certain medical information by electronic transmission. I acknowledge that I have been given the opportunity to request an in-person assessment or other available alternative prior to the telemedicine visit and am voluntarily participating in the telemedicine visit.  I understand that I have the right to withhold or withdraw my consent to the use of telemedicine in the course of my care at any time, without affecting my right to future care or treatment,  and that the Practitioner or I may terminate the telemedicine visit at any time. I understand that I have the right to inspect all information obtained and/or recorded in the course of the telemedicine visit and may receive copies of available information for a reasonable fee.  I understand that some of the potential risks of receiving the Services via telemedicine include:  Marland Kitchen. Delay or interruption in medical evaluation due to technological equipment failure or disruption; . Information transmitted may not be sufficient (e.g. poor resolution of images) to allow for appropriate medical decision making by the Practitioner; and/or  . In rare instances, security protocols could fail, causing a breach of personal health information.  Furthermore, I acknowledge that it is my responsibility to provide information about my medical history, conditions and care that is complete and accurate to the best of my ability. I acknowledge that Practitioner's advice, recommendations, and/or decision may be based on factors not within their control, such as incomplete or inaccurate data provided by me or distortions of diagnostic images or specimens that may result from electronic transmissions. I understand that the practice  of medicine is not an Chief Strategy Officer and that Practitioner makes no warranties or guarantees regarding treatment outcomes. I acknowledge that I will receive a copy of this consent concurrently upon execution via email to the email address I last provided but may also request a printed copy by calling the office of [place].    I understand that my insurance will be billed for this visit.   I have read or had this consent read to me. . I understand the contents of this consent, which adequately explains the benefits and risks of the Services being provided via telemedicine.  . I have been provided ample opportunity to ask questions regarding this consent and the Services and have had my questions  answered to my satisfaction. . I give my informed consent for the services to be provided through the use of telemedicine in my medical care  By participating in this telemedicine visit I agree to the above.

## 2019-01-14 ENCOUNTER — Other Ambulatory Visit: Payer: Self-pay | Admitting: Physician Assistant

## 2019-01-27 ENCOUNTER — Other Ambulatory Visit: Payer: Self-pay | Admitting: Cardiovascular Disease

## 2019-02-04 ENCOUNTER — Other Ambulatory Visit: Payer: Self-pay

## 2019-02-04 ENCOUNTER — Telehealth (INDEPENDENT_AMBULATORY_CARE_PROVIDER_SITE_OTHER): Payer: BC Managed Care – PPO | Admitting: Cardiovascular Disease

## 2019-02-04 ENCOUNTER — Encounter: Payer: Self-pay | Admitting: Cardiovascular Disease

## 2019-02-04 VITALS — BP 140/90 | HR 63 | Ht 70.5 in | Wt 350.0 lb

## 2019-02-04 DIAGNOSIS — I5022 Chronic systolic (congestive) heart failure: Secondary | ICD-10-CM

## 2019-02-04 DIAGNOSIS — I2510 Atherosclerotic heart disease of native coronary artery without angina pectoris: Secondary | ICD-10-CM | POA: Diagnosis not present

## 2019-02-04 DIAGNOSIS — E7800 Pure hypercholesterolemia, unspecified: Secondary | ICD-10-CM

## 2019-02-04 DIAGNOSIS — I10 Essential (primary) hypertension: Secondary | ICD-10-CM

## 2019-02-04 NOTE — Patient Instructions (Signed)
Medication Instructions:  Continue same medications If you need a refill on your cardiac medications before your next appointment, please call your pharmacy.   Lab work: CBC, basic metabolic profile, lipid and liver profile  If you have labs (blood work) drawn today and your tests are completely normal, you will receive your results only by: Marland Kitchen MyChart Message (if you have MyChart) OR . A paper copy in the mail If you have any lab test that is abnormal or we need to change your treatment, we will call you to review the results.  Testing/Procedures:   Follow-Up: At [facility], you and your health needs are our priority.  As part of our continuing mission to provide you with exceptional heart care, we have created designated Provider Care Teams.  These Care Teams include your primary Cardiologist (physician) and Advanced Practice Providers (APPs -  Physician Assistants and Nurse Practitioners) who all work together to provide you with the care you need, when you need it. You will need a follow up appointment in 6 months.  Please call our office 2 months in advance to schedule this appointment.  You may see Kathlyn Sacramento, MD or one of the following Advanced Practice Providers on your designated Care Team:   Murray Hodgkins, NP Christell Faith, PA-C . Marrianne Mood, PA-C

## 2019-02-04 NOTE — Addendum Note (Signed)
Addended by: Lamar Laundry on: 02/04/2019 03:58 PM   Modules accepted: Orders
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
## 2019-02-11 ENCOUNTER — Telehealth: Payer: Self-pay

## 2019-02-11 NOTE — Telephone Encounter (Signed)
Called the pt to f/u on his telehealth iappt with Dr.Arida. Pt is aware that [facility] would like for him to have fasting lab work. Pt will have the labs drawn at [facility] mall. Pt adv that he does not need an appt. Pt verbalized understanding and voiced appreciation for the call.

## 2019-04-07 ENCOUNTER — Other Ambulatory Visit: Payer: Self-pay

## 2019-04-07 ENCOUNTER — Other Ambulatory Visit: Payer: Self-pay | Admitting: *Deleted

## 2019-04-07 MED ORDER — LISINOPRIL 20 MG PO TABS: 20.0000 mg | ORAL_TABLET | Freq: Every day | ORAL | 2 refills | Status: DC

## 2019-04-07 MED ORDER — CLOPIDOGREL BISULFATE 75 MG PO TABS: ORAL_TABLET | ORAL | 3 refills | Status: DC

## 2019-04-14 ENCOUNTER — Other Ambulatory Visit: Payer: Self-pay | Admitting: Cardiovascular Disease

## 2019-04-28 ENCOUNTER — Other Ambulatory Visit: Payer: Self-pay | Admitting: Internal Medicine

## 2019-04-28 DIAGNOSIS — F411 Generalized anxiety disorder: Secondary | ICD-10-CM

## 2019-04-28 MED ORDER — ALPRAZOLAM 0.5 MG PO TABS: 0.5000 mg | ORAL_TABLET | Freq: Every evening | ORAL | 0 refills | Status: DC | PRN

## 2019-04-28 NOTE — Telephone Encounter (Signed)
Last filled 12/12/2018... mychart msg sent to pt letting him know he must schedule physical exam

## 2019-05-26 ENCOUNTER — Telehealth: Payer: Self-pay

## 2019-05-26 MED ORDER — CARVEDILOL 25 MG PO TABS: 25.0000 mg | ORAL_TABLET | Freq: Two times a day (BID) | ORAL | 0 refills | Status: DC

## 2019-05-26 NOTE — Telephone Encounter (Signed)
Requested Prescriptions   Signed Prescriptions Disp Refills   carvedilol (COREG) 25 MG tablet 180 tablet 0    Sig: Take 1 tablet (25 mg total) by mouth 2 (two) times daily with a meal.    Authorizing Provider: ARIDA, MUHAMMAD A    Ordering User: NEWCOMER MCCLAIN, Zemira Zehring L    

## 2019-06-03 ENCOUNTER — Ambulatory Visit (INDEPENDENT_AMBULATORY_CARE_PROVIDER_SITE_OTHER): Payer: BC Managed Care – PPO

## 2019-06-03 ENCOUNTER — Encounter: Payer: BC Managed Care – PPO | Admitting: Internal Medicine

## 2019-06-03 ENCOUNTER — Other Ambulatory Visit: Payer: Self-pay

## 2019-06-03 DIAGNOSIS — Z23 Encounter for immunization: Secondary | ICD-10-CM

## 2019-06-03 NOTE — Progress Notes (Deleted)
Subjective:    Patient ID: Steve Burnett, male    DOB: [birth year], 58 y.o.   MRN: [id number]  HPI  Pt presents to the clinic today for his annual exam. He is also due to follow up chronic conditions.  Anxiety: Triggered by family stress. He is taking Xanax. He is not seeing a therapist. He denies SI/HI.  CHF: Managed on Spironolactone, Carvedilol and Lisinopril. Echo from 04/2018 reviewed.  HLD with CAD s/p MI:  His last LDL was 132 on 01/2018 . He denies myalgias on Rosuvastatin. He is taking Carvedilol, Lisinopril, ASA and Plavax as well. He follows with cardiology.   HTN with NICM: His BP today is. He is taking Carvedilol, Lisinopril and Spironolactone as prescribed. ECG from reviewed.  OSA: he averages hours of sleep per night with the use of his CPAP.  Sleep study from 04/2014 reviewed.  Afib: Paroxysmal. Managed on Carvedilol and ASA. ECG from reviewed.  Flu: 04/2016 Tetanus: 08/2014 PSA Screening: 07/2016 Colon Screening: Vision Screening: Dentist:  Diet: Exercise:  Review of Systems      Past Medical History:  Diagnosis Date  . Anxiety   . CHF (congestive heart failure) ([facility])   . Chicken pox   . Coronary artery disease    a. 02/2012 NSTEMI/Cath/PCI: pLAD 60%, mLAD 80%, dLAD 70, nonobs LCX/RCA --> DES to prox/mid LAD; b. inf STEMI 01/23/18: ost/proxLAD 75% at the prox edge of the previously placed stent, mLAD 75%, OM1 50%, OM2 50%, pRCA 100% s/p PCI/DES, rPLA 70%; c. 02/2018 MV: Ef 44%, apical inferior infarct. No ischemia.  . Hyperlipidemia   . Hypertension   . Ischemic cardiomyopathy    a. echo 7/19: EF 35-40%, HK of the inf wall, Gr1DD, nl sized LA, RVSF nl, PASP nl, challenging image quality  . Obesity   . OSA (obstructive sleep apnea) 03/27/2014  . PAF (paroxysmal atrial fibrillation) ([facility])    a. noted during [facility] 01/23/18 during inferior STEMI; b. resolved with IV amiodarone; c. not placed on anticoagulation given brief episode, acute illness, and need for DAPT   . PVC's (premature ventricular contractions)    a. 02/2018 Holter: Frq PVC's. No afib. Beta blocker titrated.  . VF (ventricular fibrillation) ([facility])    a. noted during [facility] 01/23/2018 s/p defibrillation x 2    Current Outpatient Medications  Medication Sig Dispense Refill  . acetaminophen (TYLENOL) 650 MG CR tablet Take 650 mg by mouth as needed.    . ALPRAZolam (XANAX) 0.5 MG tablet Take 1 tablet (0.5 mg total) by mouth at bedtime as needed for anxiety. LAST REFILL, MUST SCHEDULE PHYSICAL 30 tablet 0  . aspirin 81 MG chewable tablet Chew 1 tablet (81 mg total) by mouth daily. 30 tablet 11  . carvedilol (COREG) 25 MG tablet Take 1 tablet (25 mg total) by mouth 2 (two) times daily with a meal. 180 tablet 0  . clopidogrel (PLAVIX) 75 MG tablet Take 4 tablets (300 mg) by mouth today x1, then take 1 tablet (75 mg) by mouth once a day thereafter. 90 tablet 3  . cyclobenzaprine (FLEXERIL) 5 MG tablet Take 1-2 tablets (5-10 mg total) by mouth 2 (two) times daily as needed for muscle spasms. (Patient not taking: Reported on 02/04/2019) 30 tablet 0  . ibuprofen (ADVIL,MOTRIN) 600 MG tablet Take 1 tablet (600 mg total) by mouth every 6 (six) hours as needed. (Patient not taking: Reported on 02/04/2019) 30 tablet 0  . lisinopril (ZESTRIL) 20 MG tablet Take 1 tablet (20 mg total)  by mouth daily. 90 tablet 2  . rosuvastatin (CRESTOR) 10 MG tablet TAKE 1 TABLET BY MOUTH EVERY DAY AT 6PM 90 tablet 2  . spironolactone (ALDACTONE) 25 MG tablet TAKE 1/2 TABLET BY MOUTH EVERY DAY 30 tablet 3   No current facility-administered medications for this visit.     Allergies  Allergen Reactions  . Atorvastatin Other (See Comments)    Muscle cramps   . Crestor [Rosuvastatin Calcium] Other (See Comments)    Cramps    Family History  Problem Relation Age of Onset  . Heart disease Mother   . Hyperlipidemia Mother   . Hypertension Mother   . Heart attack Mother [id number]       CABG X 4; past at age 56  . Heart disease  Sister [id number]       s/p stents   . Stroke Father   . Cancer Neg Hx   . Diabetes Neg Hx     Social History   Socioeconomic History  . Marital status: Married    Spouse name: Not on file  . Number of children: Not on file  . Years of education: Not on file  . Highest education level: Not on file  Occupational History  . Occupation: Tourist information centre managerlectronic Technician    Employer: [employer]  Social Needs  . Financial resource strain: Not on file  . Food insecurity    Worry: Not on file    Inability: Not on file  . Transportation needs    Medical: Not on file    Non-medical: Not on file  Tobacco Use  . Smoking status: Never Smoker  . Smokeless tobacco: Never Used  . Tobacco comment: Never used tobacco products  Substance and Sexual Activity  . Alcohol use: Yes    Alcohol/week: 2.0 standard drinks    Types: 2 Cans of beer per week    Comment: biweekly 1-2 beers  . Drug use: No  . Sexual activity: Not Currently  Lifestyle  . Physical activity    Days per week: Not on file    Minutes per session: Not on file  . Stress: Not on file  Relationships  . Social Musicianconnections    Talks on phone: Not on file    Gets together: Not on file    Attends religious service: Not on file    Active member of club or organization: Not on file    Attends meetings of clubs or organizations: Not on file    Relationship status: Not on file  . Intimate partner violence    Fear of current or ex partner: Not on file    Emotionally abused: Not on file    Physically abused: Not on file    Forced sexual activity: Not on file  Other Topics Concern  . Not on file  Social History Narrative   Lives locally with his son, who has a h/o liver failure s/p prior liver transplant.  He is pending repeat transplant.     Constitutional: Denies fever, malaise, fatigue, headache or abrupt weight changes.  HEENT: Denies eye pain, eye redness, ear pain, ringing in the ears, wax buildup, runny nose, nasal congestion, bloody  nose, or sore throat. Respiratory: Denies difficulty breathing, shortness of breath, cough or sputum production.   Cardiovascular: Denies chest pain, chest tightness, palpitations or swelling in the hands or feet.  Gastrointestinal: Denies abdominal pain, bloating, constipation, diarrhea or blood in the stool.  GU: Denies urgency, frequency, pain with urination, burning sensation, blood in urine,  odor or discharge. Musculoskeletal: Denies decrease in range of motion, difficulty with gait, muscle pain or joint pain and swelling.  Skin: Denies redness, rashes, lesions or ulcercations.  Neurological: Denies dizziness, difficulty with memory, difficulty with speech or problems with balance and coordination.  Psych: Denies anxiety, depression, SI/HI.  No other specific complaints in a complete review of systems (except as listed in HPI above).  Objective:   Physical Exam    There were no vitals taken for this visit. Wt Readings from Last 3 Encounters:  02/04/19 (!) 350 lb (158.8 kg)  06/13/18 (!) 339 lb 12.8 oz (154.1 kg)  06/06/18 (!) 336 lb (152.4 kg)    General: Appears their stated age, well developed, well nourished in NAD. Skin: Warm, dry and intact. No rashes, lesions or ulcerations noted. HEENT: Head: normal shape and size; Eyes: sclera white, no icterus, conjunctiva pink, PERRLA and EOMs intact; Ears: Tm's gray and intact, normal light reflex; Nose: mucosa pink and moist, septum midline; Throat/Mouth: Teeth present, mucosa pink and moist, no exudate, lesions or ulcerations noted.  Neck:  Neck supple, trachea midline. No masses, lumps or thyromegaly present.  Cardiovascular: Normal rate and rhythm. S1,S2 noted.  No murmur, rubs or gallops noted. No JVD or BLE edema. No carotid bruits noted. Pulmonary/Chest: Normal effort and positive vesicular breath sounds. No respiratory distress. No wheezes, rales or ronchi noted.  Abdomen: Soft and nontender. Normal bowel sounds. No distention or  masses noted. Liver, spleen and kidneys non palpable. Musculoskeletal: Normal range of motion. No signs of joint swelling. No difficulty with gait.  Neurological: Alert and oriented. Cranial nerves II-XII grossly intact. Coordination normal.  Psychiatric: Mood and affect normal. Behavior is normal. Judgment and thought content normal.   EKG:  BMET    Component Value Date/Time   NA 139 02/18/2018 0925   NA 138 02/02/2014 0718   K 4.2 02/18/2018 0925   K 3.9 02/02/2014 0718   CL 107 02/18/2018 0925   CL 105 02/02/2014 0718   CO2 23 02/18/2018 0925   CO2 25 02/02/2014 0718   GLUCOSE 100 (H) 02/18/2018 0925   GLUCOSE 114 (H) 02/02/2014 0718   BUN 13 02/18/2018 0925   BUN 16 02/02/2014 0718   CREATININE 0.75 02/18/2018 0925   CREATININE 0.83 02/02/2014 0718   CALCIUM 9.4 02/18/2018 0925   CALCIUM 8.5 02/02/2014 0718   GFRNONAA >60 01/24/2018 0814   GFRNONAA >60 02/02/2014 0718   GFRAA >60 01/24/2018 0814   GFRAA >60 02/02/2014 0718    Lipid Panel     Component Value Date/Time   CHOL 204 (H) 01/23/2018 0847   CHOL 149 01/15/2014 0826   CHOL 167 03/11/2012 0645   TRIG 144 01/23/2018 0847   TRIG 98 03/11/2012 0645   HDL 43 01/23/2018 0847   HDL 41 01/15/2014 0826   HDL 41 03/11/2012 0645   CHOLHDL 4.7 01/23/2018 0847   VLDL 29 01/23/2018 0847   VLDL 20 03/11/2012 0645   LDLCALC 132 (H) 01/23/2018 0847   LDLCALC 83 01/15/2014 0826   LDLCALC 106 (H) 03/11/2012 0645    CBC    Component Value Date/Time   WBC 6.5 02/18/2018 0925   RBC 5.07 02/18/2018 0925   HGB 13.4 02/18/2018 0925   HGB 13.4 02/02/2014 0718   HCT 40.2 02/18/2018 0925   HCT 41.1 02/02/2014 0718   PLT 254.0 02/18/2018 0925   PLT 233 02/02/2014 0718   MCV 79.3 02/18/2018 0925   MCV 80 02/02/2014 0718  MCH 26.2 01/24/2018 0814   MCHC 33.4 02/18/2018 0925   RDW 14.8 02/18/2018 0925   RDW 15.7 (H) 02/02/2014 0718   LYMPHSABS 2.0 01/23/2018 0847   LYMPHSABS 0.9 (L) 03/10/2012 1240   MONOABS 0.5  01/23/2018 0847   MONOABS 0.4 03/10/2012 1240   EOSABS 0.2 01/23/2018 0847   EOSABS 0.0 03/10/2012 1240   BASOSABS 0.1 01/23/2018 0847   BASOSABS 0.0 03/10/2012 1240    Hgb A1C Lab Results  Component Value Date   HGBA1C 6.0 (H) 01/23/2018          Assessment & Plan:  Encounter for Preventative Health Maintenance:

## 2019-10-09 ENCOUNTER — Other Ambulatory Visit
Admission: RE | Admit: 2019-10-09 | Discharge: 2019-10-09 | Disposition: A | Discharge status: Home / Self Care | Payer: BC Managed Care – PPO | Source: Ambulatory Visit | Attending: Cardiovascular Disease | Admitting: Cardiovascular Disease

## 2019-10-09 ENCOUNTER — Encounter: Payer: Self-pay | Admitting: Cardiovascular Disease

## 2019-10-09 ENCOUNTER — Other Ambulatory Visit: Payer: Self-pay

## 2019-10-09 ENCOUNTER — Ambulatory Visit (INDEPENDENT_AMBULATORY_CARE_PROVIDER_SITE_OTHER): Payer: BC Managed Care – PPO | Admitting: Cardiovascular Disease

## 2019-10-09 VITALS — BP 180/108 | HR 79 | Ht 70.5 in | Wt 354.0 lb

## 2019-10-09 DIAGNOSIS — I10 Essential (primary) hypertension: Secondary | ICD-10-CM

## 2019-10-09 DIAGNOSIS — I2510 Atherosclerotic heart disease of native coronary artery without angina pectoris: Secondary | ICD-10-CM | POA: Insufficient documentation

## 2019-10-09 DIAGNOSIS — E7800 Pure hypercholesterolemia, unspecified: Secondary | ICD-10-CM | POA: Diagnosis not present

## 2019-10-09 LAB — CBC WITH DIFFERENTIAL/PLATELET
Abs Immature Granulocytes: 0.01 10*3/uL (ref 0.00–0.07)
Basophils Absolute: 0.1 10*3/uL (ref 0.0–0.1)
Basophils Relative: 1 %
Eosinophils Absolute: 0.2 10*3/uL (ref 0.0–0.5)
Eosinophils Relative: 2 %
HCT: 41.9 % (ref 39.0–52.0)
Hemoglobin: 13.5 g/dL (ref 13.0–17.0)
Immature Granulocytes: 0 %
Lymphocytes Relative: 27 %
Lymphs Abs: 1.9 10*3/uL (ref 0.7–4.0)
MCH: 25.9 pg — ABNORMAL LOW (ref 26.0–34.0)
MCHC: 32.2 g/dL (ref 30.0–36.0)
MCV: 80.3 fL (ref 80.0–100.0)
Monocytes Absolute: 0.5 10*3/uL (ref 0.1–1.0)
Monocytes Relative: 8 %
Neutro Abs: 4.2 10*3/uL (ref 1.7–7.7)
Neutrophils Relative %: 62 %
Platelets: 269 10*3/uL (ref 150–400)
RBC: 5.22 MIL/uL (ref 4.22–5.81)
RDW: 15.1 % (ref 11.5–15.5)
WBC: 6.8 10*3/uL (ref 4.0–10.5)
nRBC: 0 % (ref 0.0–0.2)

## 2019-10-09 LAB — LIPID PANEL
Cholesterol: 195 mg/dL (ref 0–200)
HDL: 34 mg/dL — ABNORMAL LOW (ref >40)
LDL Cholesterol: 125 mg/dL — ABNORMAL HIGH (ref 0–99)
Total CHOL/HDL Ratio: 5.7 RATIO
Triglycerides: 180 mg/dL — ABNORMAL HIGH (ref <150)
VLDL: 36 mg/dL (ref 0–40)

## 2019-10-09 LAB — COMPREHENSIVE METABOLIC PANEL
ALT: 23 U/L (ref 0–44)
AST: 23 U/L (ref 15–41)
Albumin: 3.8 g/dL (ref 3.5–5.0)
Alkaline Phosphatase: 56 U/L (ref 38–126)
Anion gap: 8 (ref 5–15)
BUN: 14 mg/dL (ref 6–20)
CO2: 24 mmol/L (ref 22–32)
Calcium: 8.8 mg/dL — ABNORMAL LOW (ref 8.9–10.3)
Chloride: 107 mmol/L (ref 98–111)
Creatinine, Ser: 0.81 mg/dL (ref 0.61–1.24)
GFR calc Af Amer: >60 mL/min (ref >60)
GFR calc non Af Amer: >60 mL/min (ref >60)
Glucose, Bld: 128 mg/dL — ABNORMAL HIGH (ref 70–99)
Potassium: 3.7 mmol/L (ref 3.5–5.1)
Sodium: 139 mmol/L (ref 135–145)
Total Bilirubin: 0.6 mg/dL (ref 0.3–1.2)
Total Protein: 7.2 g/dL (ref 6.5–8.1)

## 2019-10-09 MED ORDER — LISINOPRIL 40 MG PO TABS: 40.0000 mg | ORAL_TABLET | Freq: Every day | ORAL | 2 refills | Status: DC

## 2019-10-09 MED ORDER — SPIRONOLACTONE 25 MG PO TABS: 25.0000 mg | ORAL_TABLET | Freq: Every day | ORAL | 2 refills | Status: DC

## 2019-10-09 MED ORDER — CARVEDILOL 25 MG PO TABS: 25.0000 mg | ORAL_TABLET | Freq: Two times a day (BID) | ORAL | 2 refills | Status: DC

## 2019-10-09 NOTE — Patient Instructions (Signed)
Medication Instructions:  Your physician has recommended you make the following change in your medication:  1) INCREASE Lisinopril to 40 mg daily. An Rx has been sent to your pharmacy  2) INCREASE Spironolactone to 25 mg daily. An Rx has been sent to your pharmacy  *If you need a refill on your cardiac medications before your next appointment, please call your pharmacy*   Lab Work: Cmet, Cbc, Lipid today  Please have your lab drawn at the [facility] medical mall  If you have labs (blood work) drawn today and your tests are completely normal, you will receive your results only by: Marland Kitchen MyChart Message (if you have MyChart) OR . A paper copy in the mail If you have any lab test that is abnormal or we need to change your treatment, we will call you to review the results.   Testing/Procedures: None ordered   Follow-Up: At [facility], you and your health needs are our priority.  As part of our continuing mission to provide you with exceptional heart care, we have created designated Provider Care Teams.  These Care Teams include your primary Cardiologist (physician) and Advanced Practice Providers (APPs -  Physician Assistants and Nurse Practitioners) who all work together to provide you with the care you need, when you need it.  We recommend signing up for the patient portal called "MyChart".  Sign up information is provided on this After Visit Summary.  MyChart is used to connect with patients for Virtual Visits (Telemedicine).  Patients are able to view lab/test results, encounter notes, upcoming appointments, etc.  Non-urgent messages can be sent to your provider as well.   To learn more about what you can do with MyChart, go to [email or web address].    Your next appointment:   4 month(s)  The format for your next appointment:   In Person  Provider:    You may see Lorine Bears, MD or one of the following Advanced Practice Providers on your designated Care Team:    Nicolasa Ducking, NP  Eula Listen, PA-C  Marisue Ivan, PA-C    Other Instructions N/A

## 2019-10-09 NOTE — Progress Notes (Signed)
Cardiology Office Note   Date:  10/09/2019   ID:  Steve Burnett, DOB 1961-05-25, MRN [id number]  PCP:  Lorre Munroe, NP  Cardiologist:   Lorine Bears, MD   Chief Complaint  Patient presents with  . office visit    Pt 6 month f/u. Pt states having some recent discomfort in chest never felt before, also swelling in feet. Meds verbally reviewed w/ pt.      History of Present Illness: Steve Burnett is a 59 y.o. male who presents for a follow-up visit regarding coronary artery disease and mild ischemic cardiomyopathy. He has chronic medical conditions including morbid obesity, essential hypertension and hyperlipidemia. He had non-ST elevation myocardial infarction in August 2013.  He was found to have obstructive disease affecting the mid LAD which was treated with drug-eluting stent placement. More recently, he presented with an acute inferior ST elevation myocardial infarction in July 2019 complicated by ventricular fibrillation.  Cardiac catheterization showed occluded proximal RCA which was treated successfully with PCI and drug-eluting stent placement.  There was residual 70 to 75% in-stent restenosis in the proximal LAD. He subsequently underwent a Lexiscan Myoview which showed no evidence of anterior wall ischemia. Most recent echocardiogram in October 2019 showed an EF of 45 to 50% with grade 2 diastolic dysfunction. He did have increased PVCs post MI which improved after increasing carvedilol to 25 mg twice daily.  He has been doing reasonably well overall with no exertional chest pain.  He did have one episode of chest pain last month which woke him up from sleep and lasted for few minutes.  It was different from his prior myocardial infarction.  Unfortunately, he continues to gain weight.  His blood pressure is very elevated today.  He has been using ibuprofen for dental pain.    Past Medical History:  Diagnosis Date  . Anxiety   . CHF (congestive heart failure)  (HCC)   . Chicken pox   . Coronary artery disease    a. 02/2012 NSTEMI/Cath/PCI: pLAD 60%, mLAD 80%, dLAD 70, nonobs LCX/RCA --> DES to prox/mid LAD; b. inf STEMI 01/23/18: ost/proxLAD 75% at the prox edge of the previously placed stent, mLAD 75%, OM1 50%, OM2 50%, pRCA 100% s/p PCI/DES, rPLA 70%; c. 02/2018 MV: Ef 44%, apical inferior infarct. No ischemia.  . Hyperlipidemia   . Hypertension   . Ischemic cardiomyopathy    a. echo 7/19: EF 35-40%, HK of the inf wall, Gr1DD, nl sized LA, RVSF nl, PASP nl, challenging image quality  . Obesity   . OSA (obstructive sleep apnea) 03/27/2014  . PAF (paroxysmal atrial fibrillation) (HCC)    a. noted during [facility] 01/23/18 during inferior STEMI; b. resolved with IV amiodarone; c. not placed on anticoagulation given brief episode, acute illness, and need for DAPT  . PVC's (premature ventricular contractions)    a. 02/2018 Holter: Frq PVC's. No afib. Beta blocker titrated.  . VF (ventricular fibrillation) (HCC)    a. noted during [facility] 01/23/2018 s/p defibrillation x 2    Past Surgical History:  Procedure Laterality Date  . CARDIAC CATHETERIZATION  03/12/2012  . CORONARY ANGIOPLASTY  03/12/2012   s/p drug eluting stent to the mid LAD : 3.5 x 38 mm Xience drug-eluting stent. Post dilated to 4.0 in mid segment and 4.5 proximally.  Rosalie Doctor ACUTE MI REVASCULARIZATION N/A 01/23/2018   Procedure: Coronary/Graft Acute MI Revascularization;  Surgeon: Marcina Millard, MD;  Location: [facility] INVASIVE CV LAB;  Service: Cardiovascular;  Laterality: N/A;  .  LEFT HEART CATH AND CORONARY ANGIOGRAPHY N/A 01/23/2018   Procedure: LEFT HEART CATH AND CORONARY ANGIOGRAPHY;  Surgeon: Marcina Millard, MD;  Location: [facility] INVASIVE CV LAB;  Service: Cardiovascular;  Laterality: N/A;     Current Outpatient Medications  Medication Sig Dispense Refill  . acetaminophen (TYLENOL) 650 MG CR tablet Take 650 mg by mouth as needed.    Marland Kitchen aspirin 81 MG chewable tablet Chew 1 tablet (81  mg total) by mouth daily. 30 tablet 11  . carvedilol (COREG) 25 MG tablet Take 1 tablet (25 mg total) by mouth 2 (two) times daily with a meal. 180 tablet 0  . clopidogrel (PLAVIX) 75 MG tablet Take 4 tablets (300 mg) by mouth today x1, then take 1 tablet (75 mg) by mouth once a day thereafter. 90 tablet 3  . cyclobenzaprine (FLEXERIL) 5 MG tablet Take 1-2 tablets (5-10 mg total) by mouth 2 (two) times daily as needed for muscle spasms. 30 tablet 0  . ibuprofen (ADVIL,MOTRIN) 600 MG tablet Take 1 tablet (600 mg total) by mouth every 6 (six) hours as needed. 30 tablet 0  . lisinopril (ZESTRIL) 20 MG tablet Take 1 tablet (20 mg total) by mouth daily. 90 tablet 2  . rosuvastatin (CRESTOR) 10 MG tablet TAKE 1 TABLET BY MOUTH EVERY DAY AT 6PM 90 tablet 2  . spironolactone (ALDACTONE) 25 MG tablet TAKE 1/2 TABLET BY MOUTH EVERY DAY 30 tablet 3   No current facility-administered medications for this visit.    Allergies:   Atorvastatin and Crestor [rosuvastatin calcium]    Social History:  The patient  reports that he has never smoked. He has never used smokeless tobacco. He reports current alcohol use of about 2.0 standard drinks of alcohol per week. He reports that he does not use drugs.   Family History:  The patient's family history includes Heart attack (age of onset: 36) in his mother; Heart disease in his mother; Heart disease (age of onset: 17) in his sister; Hyperlipidemia in his mother; Hypertension in his mother; Stroke in his father.    ROS:  Please see the history of present illness.   Otherwise, review of systems are positive for none.   All other systems are reviewed and negative.    PHYSICAL EXAM: VS:  BP (!) 180/108 (BP Location: Left Arm, Patient Position: Sitting, Cuff Size: Large)   Pulse 79   Ht 5' 10.5" (1.791 m)   Wt (!) 354 lb (160.6 kg)   SpO2 97%   BMI 50.08 kg/m  , BMI Body mass index is 50.08 kg/m. GEN: Well nourished, well developed, in no acute distress  HEENT:  normal  Neck: no JVD, carotid bruits, or masses Cardiac: RRR; no murmurs, rubs, or gallops,no edema  Respiratory:  clear to auscultation bilaterally, normal work of breathing GI: soft, nontender, nondistended, + BS MS: no deformity or atrophy  Skin: warm and dry, no rash Neuro:  Strength and sensation are intact Psych: euthymic mood, full affect   EKG:  EKG is ordered today. The ekg ordered today demonstrates normal sinus rhythm with no significant ST or T wave changes.   Recent Labs: No results found for requested labs within last 8760 hours.    Lipid Panel    Component Value Date/Time   CHOL 204 (H) 01/23/2018 0847   CHOL 149 01/15/2014 0826   CHOL 167 03/11/2012 0645   TRIG 144 01/23/2018 0847   TRIG 98 03/11/2012 0645   HDL 43 01/23/2018 0847   HDL  41 01/15/2014 0826   HDL 41 03/11/2012 0645   CHOLHDL 4.7 01/23/2018 0847   VLDL 29 01/23/2018 0847   VLDL 20 03/11/2012 0645   LDLCALC 132 (H) 01/23/2018 0847   LDLCALC 83 01/15/2014 0826   LDLCALC 106 (H) 03/11/2012 0645   LDLDIRECT 79.0 08/08/2016 1540      Wt Readings from Last 3 Encounters:  10/09/19 (!) 354 lb (160.6 kg)  02/04/19 (!) 350 lb (158.8 kg)  06/13/18 (!) 339 lb 12.8 oz (154.1 kg)      No flowsheet data found.    ASSESSMENT AND PLAN:  1.  Coronary artery disease involving native coronary arteries without angina: Doing reasonably well overall.  Continue medical therapy.  Continue lifelong dual antiplatelet therapy as tolerated.  He does have significant in-stent restenosis in the mid LAD but currently with no anginal symptoms.  Continue to monitor clinically.  2. Hyperlipidemia: He has history of intolerance to statins but have been tolerating rosuvastatin 10 mg daily.  I requested lipid and liver profile  3. Essential hypertension: Blood pressure is very high with her reports being under stress and also has been taking ibuprofen.  I advised him to cut down on that.  I elected to increase  lisinopril to 20 mg daily and spironolactone 25 mg once daily.  Check basic metabolic profile today.  4.  Chronic systolic heart failure due to ischemic cardiomyopathy: Most recent ejection fraction improved to 45 to 50%.  Continue treatment with lisinopril, carvedilol and spironolactone.  5.  Morbid obesity: I discussed with him the importance of healthy diet and regular exercise.      Disposition:   FU with me in 4 months  Signed,  Kathlyn Sacramento, MD  10/09/2019 4:20 PM    [facility]

## 2019-10-10 ENCOUNTER — Telehealth: Payer: Self-pay

## 2019-10-10 MED ORDER — EZETIMIBE 10 MG PO TABS: 10.0000 mg | ORAL_TABLET | Freq: Every day | ORAL | 3 refills | Status: DC

## 2019-10-10 NOTE — Telephone Encounter (Signed)
-----   Message from Iran Ouch, MD sent at 10/10/2019 11:32 AM EDT ----- Inform patient that labs were normal. Cholesterol was mildly elevated.  He is not able to tolerate higher doses of rosuvastatin and thus I recommend adding Zetia 10 mg daily.

## 2019-10-10 NOTE — Telephone Encounter (Signed)
Attempted to call patient. [facility] 10/10/2019

## 2019-10-10 NOTE — Telephone Encounter (Signed)
Incoming call returned by patient. Reviewed POC from Dr. Kirke Corin.   rx updated in pt chart and sent to pharmacy on file.   No further orders at this time.   Advised pt to call for any further questions or concerns.

## 2019-12-29 ENCOUNTER — Encounter: Payer: Self-pay | Admitting: Internal Medicine

## 2019-12-29 ENCOUNTER — Telehealth (INDEPENDENT_AMBULATORY_CARE_PROVIDER_SITE_OTHER): Payer: BC Managed Care – PPO | Admitting: Internal Medicine

## 2019-12-29 DIAGNOSIS — J0100 Acute maxillary sinusitis, unspecified: Secondary | ICD-10-CM

## 2019-12-29 MED ORDER — AZITHROMYCIN 250 MG PO TABS: ORAL_TABLET | ORAL | 0 refills | Status: DC

## 2019-12-29 MED ORDER — PREDNISONE 10 MG PO TABS: ORAL_TABLET | ORAL | 0 refills | Status: DC

## 2019-12-29 NOTE — Patient Instructions (Signed)

## 2019-12-29 NOTE — Progress Notes (Signed)
Virtual Visit via Video Note  I connected with Steve Burnett on 12/29/19 at  8:15 AM EDT by a video enabled telemedicine application and verified that I am speaking with the correct person using two identifiers.  Location: Patient: In his car Provider: Office   I discussed the limitations of evaluation and management by telemedicine and the availability of in person appointments. The patient expressed understanding and agreed to proceed.  HPI  Pt reports headache, left sided facial pain and pressure and nasal congestion. He reports this started 3 weeks ago. The headache is behind his left eye. He describes the pain as pressure. He is blowing grey/green mucous out of his nose. He reports a foul taste he can taste and smell, draining down his throat. He denies dizziness, visual changes, eye pain, eye redness, eye discharge, ear pain, loss of taste/smell, cough or SOB. He denies fever, chills or body aches. He has tried Ibuprofen and sinus relief with minimal relief.  Review of Systems     Past Medical History:  Diagnosis Date  . Anxiety   . CHF (congestive heart failure) (HCC)   . Chicken pox   . Coronary artery disease    a. 02/2012 NSTEMI/Cath/PCI: pLAD 60%, mLAD 80%, dLAD 70, nonobs LCX/RCA --> DES to prox/mid LAD; b. inf STEMI 01/23/18: ost/proxLAD 75% at the prox edge of the previously placed stent, mLAD 75%, OM1 50%, OM2 50%, pRCA 100% s/p PCI/DES, rPLA 70%; c. 02/2018 MV: Ef 44%, apical inferior infarct. No ischemia.  . Hyperlipidemia   . Hypertension   . Ischemic cardiomyopathy    a. echo 7/19: EF 35-40%, HK of the inf wall, Gr1DD, nl sized LA, RVSF nl, PASP nl, challenging image quality  . Obesity   . OSA (obstructive sleep apnea) 03/27/2014  . PAF (paroxysmal atrial fibrillation) (HCC)    a. noted during [facility] 01/23/18 during inferior STEMI; b. resolved with IV amiodarone; c. not placed on anticoagulation given brief episode, acute illness, and need for DAPT  . PVC's (premature  ventricular contractions)    a. 02/2018 Holter: Frq PVC's. No afib. Beta blocker titrated.  . VF (ventricular fibrillation) (HCC)    a. noted during [facility] 01/23/2018 s/p defibrillation x 2    Family History  Problem Relation Age of Onset  . Heart disease Mother   . Hyperlipidemia Mother   . Hypertension Mother   . Heart attack Mother 63       CABG X 4; past at age 20  . Heart disease Sister 51       s/p stents   . Stroke Father   . Cancer Neg Hx   . Diabetes Neg Hx     Social History   Socioeconomic History  . Marital status: Married    Spouse name: Not on file  . Number of children: Not on file  . Years of education: Not on file  . Highest education level: Not on file  Occupational History  . Occupation: Tourist information centre manager: [employer]  Tobacco Use  . Smoking status: Never Smoker  . Smokeless tobacco: Never Used  . Tobacco comment: Never used tobacco products  Substance and Sexual Activity  . Alcohol use: Yes    Alcohol/week: 2.0 standard drinks    Types: 2 Cans of beer per week    Comment: biweekly 1-2 beers  . Drug use: No  . Sexual activity: Not Currently  Other Topics Concern  . Not on file  Social History Narrative  Lives locally with his son, who has a h/o liver failure s/p prior liver transplant.  He is pending repeat transplant.   Social Determinants of Health   Financial Resource Strain:   . Difficulty of Paying Living Expenses:   Food Insecurity:   . Worried About Charity fundraiser in the Last Year:   . Arboriculturist in the Last Year:   Transportation Needs:   . Film/video editor (Medical):   Marland Kitchen Lack of Transportation (Non-Medical):   Physical Activity:   . Days of Exercise per Week:   . Minutes of Exercise per Session:   Stress:   . Feeling of Stress :   Social Connections:   . Frequency of Communication with Friends and Family:   . Frequency of Social Gatherings with Friends and Family:   . Attends Religious Services:   .  Active Member of Clubs or Organizations:   . Attends Archivist Meetings:   Marland Kitchen Marital Status:   Intimate Partner Violence:   . Fear of Current or Ex-Partner:   . Emotionally Abused:   Marland Kitchen Physically Abused:   . Sexually Abused:     Allergies  Allergen Reactions  . Atorvastatin Other (See Comments)    Muscle cramps   . Crestor [Rosuvastatin Calcium] Other (See Comments)    Cramps     Constitutional: Positive headache. Denies fatigue, fever or abrupt weight changes.  HEENT:  Positive facial pain, nasal congestion. Denies eye redness, ear pain, ringing in the ears, wax buildup, runny nose or sore throat. Respiratory:  Denies cough, difficulty breathing or shortness of breath.  Cardiovascular: Denies chest pain, chest tightness, palpitations or swelling in the hands or feet.   No other specific complaints in a complete review of systems (except as listed in HPI above).  Objective:   General: Appears his stated age, obese in NAD. HEENT: Head: normal shape and size, left maxillary sinus tenderness noted;  Nose: congestion noted; Throat/Mouth: no hoarseness noted.  Pulmonary/Chest: Normal effort. No respiratory distress.       Assessment & Plan:   Acute Maxillary Sinusitis  Can use a Neti Pot which can be purchased from your local drug store. Flonase 2 sprays each nostril for 3 days and then as needed. eRx for Azithromax x 5 days RX for Pred Taper x 6 days  RTC as needed or if symptoms persist. Webb Silversmith, NP  Follow Up Instructions:    I discussed the assessment and treatment plan with the patient. The patient was provided an opportunity to ask questions and all were answered. The patient agreed with the plan and demonstrated an understanding of the instructions.   The patient was advised to call back or seek an in-person evaluation if the symptoms worsen or if the condition fails to improve as anticipated.    Webb Silversmith, NP

## 2020-01-06 ENCOUNTER — Other Ambulatory Visit: Payer: Self-pay

## 2020-01-06 MED ORDER — ROSUVASTATIN CALCIUM 10 MG PO TABS: ORAL_TABLET | ORAL | 2 refills | Status: DC

## 2020-01-06 MED ORDER — LISINOPRIL 40 MG PO TABS: 40.0000 mg | ORAL_TABLET | Freq: Every day | ORAL | 2 refills | Status: DC

## 2020-01-06 MED ORDER — CARVEDILOL 25 MG PO TABS: 25.0000 mg | ORAL_TABLET | Freq: Two times a day (BID) | ORAL | 2 refills | Status: DC

## 2020-01-06 MED ORDER — SPIRONOLACTONE 25 MG PO TABS: 25.0000 mg | ORAL_TABLET | Freq: Every day | ORAL | 2 refills | Status: DC

## 2020-01-06 MED ORDER — EZETIMIBE 10 MG PO TABS: 10.0000 mg | ORAL_TABLET | Freq: Every day | ORAL | 3 refills | Status: DC

## 2020-01-06 MED ORDER — CLOPIDOGREL BISULFATE 75 MG PO TABS: 75.0000 mg | ORAL_TABLET | Freq: Once | ORAL | 3 refills | Status: AC
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
## 2020-01-16 ENCOUNTER — Telehealth: Payer: Self-pay | Admitting: Cardiovascular Disease
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
## 2020-01-16 NOTE — Telephone Encounter (Signed)
Pt c/o of Chest Pain: STAT if CP now or developed within 24 hours  1. Are you having CP right now? Not a pain but a watery feeling in chest   2. Are you experiencing any other symptoms (ex. SOB, nausea, vomiting, sweating)? Lightheaded   3. How long have you been experiencing CP? Couple weeks - worse today  4. Is your CP continuous or coming and going? Continuous   5. Have you taken Nitroglycerin? Doesn't have any  ?

## 2020-01-16 NOTE — Telephone Encounter (Signed)
Spoke with patient. He expressed concern over the 'odd' feeling in his chest he has been experiencing over the last week, or week and a half. He stated that his 'heart feels like it is floating in water'. He felt like he might be having some PVC's and some intermittent pain on the left side, that feels more like a 'gurgly' sensation. He denies any SOB, radiating pain, or nausea. He stated he does feel tired, more so than usual.  He has been taking all of his medications as prescribed, with the recent increase to the following at his OV with Dr. Kirke Corin on 10/09/19: 1) INCREASE Lisinopril to 40 mg daily. An Rx has been sent to your pharmacy 2) INCREASE Spironolactone to 25 mg daily. An Rx has been sent to your pharmacy.  Patient was currently at work and was hoping he could come in for an EKG today. I informed him that no providers were available and if his Chest pain were to continue or he were to develop SOB or an increase to his symptoms that he would need to go to the ER for evaluation. I also informed him I would forward his concerns to Dr. Kirke Corin for further recommendation, he has a scheduled OV with Nicolasa Ducking on 02/10/20.

## 2020-01-19 ENCOUNTER — Other Ambulatory Visit: Payer: Self-pay | Admitting: Cardiovascular Disease

## 2020-01-20 NOTE — Telephone Encounter (Signed)
This requires an office visit for proper evaluation.

## 2020-01-21 NOTE — Telephone Encounter (Signed)
Spoke with the patient. Patient made aware of Dr. Jari Sportsman response and recommendation.  Patient sts that he feels his symptoms were related to the recent increase in his medications (lisinopril 40 mg daily, spironolactone 25 mg daily). Patient sts that he thinks his BP was to low on that regimen. He did not check his BP and does not check it regularly. Patient sts that while he was waiting to hear back from our office he decided to reduce his medications back down to his original dosage (lisinopril 20 mg daily, spironolactone 12.5 mg daily). Patient sts that he feels much better. He did check his BP once since reducing his medication 147/77.  Patient is scheduled on 02/10/20 with Ward Givens, NP. Recommended that his appt be moved up to address his BP and medication issues. Appt rescheduled for 01/29/20 @ 10:55am (patient rqst this date since he is off that day).  Adv the patient to ck his BP daily up to his appt date and to bring his BP readings with him to his appt.  Patient agreeable with the plan and voiced appreciation for the call.

## 2020-01-29 ENCOUNTER — Other Ambulatory Visit: Payer: Self-pay

## 2020-01-29 ENCOUNTER — Ambulatory Visit (INDEPENDENT_AMBULATORY_CARE_PROVIDER_SITE_OTHER): Payer: BC Managed Care – PPO | Admitting: Nurse Practitioner

## 2020-01-29 ENCOUNTER — Encounter: Payer: Self-pay | Admitting: Nurse Practitioner

## 2020-01-29 ENCOUNTER — Ambulatory Visit (INDEPENDENT_AMBULATORY_CARE_PROVIDER_SITE_OTHER): Payer: BC Managed Care – PPO

## 2020-01-29 VITALS — BP 158/90 | HR 96 | Ht 70.0 in | Wt 352.0 lb

## 2020-01-29 DIAGNOSIS — G4733 Obstructive sleep apnea (adult) (pediatric): Secondary | ICD-10-CM

## 2020-01-29 DIAGNOSIS — R002 Palpitations: Secondary | ICD-10-CM

## 2020-01-29 DIAGNOSIS — I480 Paroxysmal atrial fibrillation: Secondary | ICD-10-CM

## 2020-01-29 DIAGNOSIS — I493 Ventricular premature depolarization: Secondary | ICD-10-CM | POA: Diagnosis not present

## 2020-01-29 DIAGNOSIS — E7800 Pure hypercholesterolemia, unspecified: Secondary | ICD-10-CM | POA: Diagnosis not present

## 2020-01-29 DIAGNOSIS — I2510 Atherosclerotic heart disease of native coronary artery without angina pectoris: Secondary | ICD-10-CM

## 2020-01-29 DIAGNOSIS — I10 Essential (primary) hypertension: Secondary | ICD-10-CM

## 2020-01-29 DIAGNOSIS — E785 Hyperlipidemia, unspecified: Secondary | ICD-10-CM

## 2020-01-29 NOTE — Progress Notes (Signed)
Office Visit    Patient Name: Steve Burnett Date of Encounter: 01/29/2020  Primary Care Provider:  Lorre Munroe, NP Primary Cardiologist:  Lorine Bears, MD  Chief Complaint    59 y/o ? w/a h/o CAD status post non-STEMI in 2013 and inferior STEMI in 2019, hypertension, hyperlipidemia, ischemic cardiomyopathy, HFrEF, obesity, and sleep apnea who presents for follow-up related to palpitations.  Past Medical History    Past Medical History:  Diagnosis Date  . Anxiety   . Chicken pox   . Chronic combined systolic (congestive) and diastolic (congestive) heart failure (HCC)    a. 01/2018 Echo: EF 35-40%; b. 04/2018 Echo: EF 45-50%, diff HK, Gr2 DD.  Marland Kitchen Coronary artery disease    a. 02/2012 NSTEMI/Cath/PCI: pLAD 60%, mLAD 80%, dLAD 70, nonobs LCX/RCA --> DES to prox/mid LAD; b. inf STEMI 01/23/18: ost/proxLAD 75% at the prox edge of the previously placed stent, mLAD 75%, OM1 50%, OM2 50%, pRCA 100% s/p PCI/DES, rPLA 70%; c. 02/2018 MV: Ef 44%, apical inferior infarct. No ischemia.  . Hyperlipidemia   . Hypertension   . Ischemic cardiomyopathy    a. echo 7/19: EF 35-40%, HK of the inf wall, Gr1DD; b. 04/2018 Echo: EF 45-50%, diff HK, Gr2 DD, mildly dil Ao root/Asc Al. Mild MR. Mild BAE. Nl RV fxn.  . Obesity   . OSA (obstructive sleep apnea) 03/27/2014  . PAF (paroxysmal atrial fibrillation) (HCC)    a. noted during [facility] 01/23/18 during inferior STEMI; b. resolved with IV amiodarone; c. not placed on anticoagulation given brief episode, acute illness, and need for DAPT  . PVC's (premature ventricular contractions)    a. 02/2018 Holter: Frq PVC's. No afib. Beta blocker titrated.  . VF (ventricular fibrillation) (HCC)    a. noted during [facility] 01/23/2018 s/p defibrillation x 2   Past Surgical History:  Procedure Laterality Date  . CARDIAC CATHETERIZATION  03/12/2012  . CORONARY ANGIOPLASTY  03/12/2012   s/p drug eluting stent to the mid LAD : 3.5 x 38 mm Xience drug-eluting stent. Post dilated to  4.0 in mid segment and 4.5 proximally.  Rosalie Doctor ACUTE MI REVASCULARIZATION N/A 01/23/2018   Procedure: Coronary/Graft Acute MI Revascularization;  Surgeon: Marcina Millard, MD;  Location: [facility] INVASIVE CV LAB;  Service: Cardiovascular;  Laterality: N/A;  . LEFT HEART CATH AND CORONARY ANGIOGRAPHY N/A 01/23/2018   Procedure: LEFT HEART CATH AND CORONARY ANGIOGRAPHY;  Surgeon: Marcina Millard, MD;  Location: [facility] INVASIVE CV LAB;  Service: Cardiovascular;  Laterality: N/A;    Allergies  Allergies  Allergen Reactions  . Atorvastatin Other (See Comments)    Muscle cramps   . Crestor [Rosuvastatin Calcium] Other (See Comments)    Cramps    History of Present Illness    59 year old male with above complex past medical history including CAD, hypertension, hyperlipidemia, ischemic cardiomyopathy, HFrEF, obesity, and sleep apnea.  In August 2013, he suffered a non-STEMI in the setting of severe mid LAD disease requiring drug-eluting stent placement.  In July 2019, he suffered an inferior STEMI and was found to have an occluded right coronary artery and underwent drug-eluting stent placement.  This was complicated by VF arrest x2.  He also had a brief episode of paroxysmal atrial fibrillation requiring amiodarone.  Catheterization also showed moderate in-stent restenosis in the LAD, which was medically managed.  Echocardiogram during that admission showed an EF of 35 to 40% with inferior hypokinesis and grade 1 diastolic dysfunction.  A Lexiscan Myoview was performed as an outpatient  to evaluate the ischemic burden of his LAD in August 2019, and this showed prior inferior infarct without ischemia.  Event monitoring that time was performed in the setting of palpitations did show frequent PVCs resulting in titration of beta-blocker therapy.  Follow-up echocardiogram in October 2019 showed improvement in LV function with an EF of 45-50% with diffuse hypokinesis.  He was last seen in  cardiology clinic in March of this year, at which time he was feeling well and ongoing medical therapy was recommended.  His lisinopril was increased to 40mg  daily and spironolactone increased to 25mg  daily. Since then, he has noted more frequent palpitations.  These are most notable while lying on his left side, and feel like a brief "whooshing" sensation.  In general, he feels as though these are similar to what he experienced with PVCs in the past.  On other occasions recently however, he has noted elevations in heart rates with irregular rhythm, lasting between 20 minutes to an hour or more.  Besides noticing palpitations, he is not particularly symptomatic.  As a result of increased palpitations, he has cut back on his lisinopril and spiro to 1/2 tab daily.  Since then, he has noted less frequent palpitations.  He has otherwise remained active.  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  He denies PND, orthopnea, dizziness, syncope, edema, or early satiety.  He does have sleep apnea and uses CPAP regularly.  Home Medications    Prior to Admission medications   Medication Sig Start Date End Date Taking? Authorizing Provider  aspirin 81 MG chewable tablet Chew 1 tablet (81 mg total) by mouth daily. 01/26/18  Yes Dunn, , PA-C  carvedilol (COREG) 25 MG tablet Take 1 tablet (25 mg total) by mouth 2 (two) times daily with a meal. 01/06/20  Yes Raymon Mutton, MD  ezetimibe (ZETIA) 10 MG tablet Take 1 tablet (10 mg total) by mouth daily. 01/06/20 04/05/20 Yes 01/08/20, MD  ibuprofen (ADVIL,MOTRIN) 600 MG tablet Take 1 tablet (600 mg total) by mouth every 6 (six) hours as needed. 05/30/18  Yes Phelps, Erin O, PA-C  lisinopril (ZESTRIL) 20 MG tablet Take 20 mg by mouth daily.   Yes [provider]  rosuvastatin (CRESTOR) 10 MG tablet TAKE 1 TABLET BY MOUTH EVERY DAY AT 6PM 01/06/20  Yes 13/7/19, MD  spironolactone (ALDACTONE) 25 MG tablet Take 12.5 mg by mouth daily.   Yes [provider]    Review of Systems    As above, he has noticed an increase in irregular palpitations, lasting up to an hour or more.  He denies chest pain, dyspnea, PND, orthopnea, dizziness, syncope, edema, or early satiety.  All other systems reviewed and are otherwise negative except as noted above.  Physical Exam    VS:  BP (!) 158/90   Pulse 96   Ht 5\' 10"  (1.778 m)   Wt (!) 352 lb (159.7 kg)   SpO2 98%   BMI 50.51 kg/m  , BMI Body mass index is 50.51 kg/m. GEN: Obese, in no acute distress. HEENT: normal. Neck: Supple, obese, difficult to gauge JVP.  No carotid bruits, or masses. Cardiac: RRR, no murmurs, rubs, or gallops. No clubbing, cyanosis.  Trace bilateral ankle edema.  Radials 2+ and equal bilaterally.  Respiratory:  Respirations regular and unlabored, clear to auscultation bilaterally. GI: Soft, nontender, nondistended, BS +  x 4. MS: no deformity or atrophy. Skin: warm and dry, no rash. Neuro:  Strength and sensation are intact. Psych: Normal affect.  Accessory Clinical Findings    ECG personally reviewed by me today - RSR, 77, no acute ST/T changes - no acute changes.  Lab Results  Component Value Date   WBC 6.8 10/09/2019   HGB 13.5 10/09/2019   HCT 41.9 10/09/2019   MCV 80.3 10/09/2019   PLT 269 10/09/2019   Lab Results  Component Value Date   CREATININE 0.81 10/09/2019   BUN 14 10/09/2019   NA 139 10/09/2019   K 3.7 10/09/2019   CL 107 10/09/2019   CO2 24 10/09/2019   Lab Results  Component Value Date   ALT 23 10/09/2019   AST 23 10/09/2019   ALKPHOS 56 10/09/2019   BILITOT 0.6 10/09/2019   Lab Results  Component Value Date   CHOL 195 10/09/2019   HDL 34 (L) 10/09/2019   LDLCALC 125 (H) 10/09/2019   LDLDIRECT 79.0 08/08/2016   TRIG 180 (H) 10/09/2019   CHOLHDL 5.7 10/09/2019    Lab Results  Component Value Date   HGBA1C 6.0 (H)  01/23/2018    Assessment & Plan    1.  Palpitations./PVCs/PAF:  Pt has a history of PVCs and recently has been noticing more frequent episodes of irregular heart rhythm that can last anywhere between 20 minutes and over an hour.  He thinks it feels a little bit different than his PVCs.  He does have a history of paroxysmal atrial fibrillation that occurred in the setting of his MI in 2019 and has ongoing risk factors for recurrent A. fib.  I will check a bmet and TSH today.  We discussed this today and I will place a 14-day ZIO monitor to assess for new arrhythmia.  Cont  blocker.  2.  CAD:  S/p prior LAD/RCA PCI.  No chest pain or dyspnea.  Cont asa,  blocker, statin, zetia, and acei.  3.  Essential HTN:  BP elevated today.  He reduced his lisinopril and spironolactone in the setting of palpitations.  We discussed how it's unlikely that these medications played a significant role.  He is willing to go back up on lisinopril to 40 mg daily.  Cont current spiro dose.  F/u bmet today.  4.  HL:  He hasn't had lipids checked in a few yrs.  He is fasting this AM.  I will check a complete metabolic panel and lipids.  He remains on low-dose rosuvastatin and Zetia therapy.  5.  Ischemic cardiomyopathy/HFrEF: EF 45 to 50% by echo in October 2019.  He remains active without dyspnea and is without significant volume overload on exam today.  Continue carvedilol and titrate lisinopril back to 40 mg daily.  He remains on low-dose spironolactone.  6.  OSA: Compliant with CPAP.    7.  Disposition: Follow-up ZIO monitoring as outlined above.  Follow-up complete metabolic panel, TSH, and fasting lipids.  Follow-up in clinic in approximately 6 to 8 weeks.   Nicolasa Ducking, NP 01/29/2020, 5:58 PM

## 2020-01-29 NOTE — Patient Instructions (Addendum)
Medication Instructions:  Your physician recommends that you continue on your current medications as directed. Please refer to the Current Medication list given to you today.  *If you need a refill on your cardiac medications before your next appointment, please call your pharmacy*   Lab Work: TODAY: CMET, TSH. LIPIDS If you have labs (blood work) drawn today and your tests are completely normal, you will receive your results only by: Marland Kitchen MyChart Message (if you have MyChart) OR . A paper copy in the mail If you have any lab test that is abnormal or we need to change your treatment, we will call you to review the results.   Testing/Procedures: Your physician has recommended that you wear an 2 WEEK ZIO event monitor. Event monitors are medical devices that record the heart's electrical activity. Doctors most often [place] these monitors to diagnose arrhythmias. Arrhythmias are problems with the speed or rhythm of the heartbeat. The monitor is a small, portable device. You can wear one while you do your normal daily activities. This is usually used to diagnose what is causing palpitations/syncope (passing out).  ZIO XT- Long Term Monitor Instructions   Your physician has requested you wear your ZIO patch monitor___14____days.   This is a single patch monitor.  [employer] supplies one patch monitor per enrollment.  Additional stickers are not available.   Please do not apply patch if you will be having a Nuclear Stress Test, Echocardiogram, Cardiac CT, MRI, or Chest Xray during the time frame you would be wearing the monitor. The patch cannot be worn during these tests.  You cannot remove and re-apply the ZIO XT patch monitor.   Your ZIO patch monitor will be sent USPS Priority mail from [facility] directly to your home address. The monitor may also be mailed to a PO BOX if home delivery is not available.   It may take 3-5 days to receive your monitor after you have been enrolled.   Once you  have received you monitor, please review enclosed instructions.  Your monitor has already been registered assigning a specific monitor serial # to you.   Applying the monitor   Shave hair from upper left chest.   Hold abrader disc by orange tab.  Rub abrader in 40 strokes over left upper chest as indicated in your monitor instructions.   Clean area with 4 enclosed alcohol pads .  Use all pads to assure are is cleaned thoroughly.  Let dry.   Apply patch as indicated in monitor instructions.  Patch will be place under collarbone on left side of chest with arrow pointing upward.   Rub patch adhesive wings for 2 minutes.Remove white label marked "1".  Remove white label marked "2".  Rub patch adhesive wings for 2 additional minutes.   While looking in a mirror, press and release button in center of patch.  A small green light will flash 3-4 times .  This will be your only indicator the monitor has been turned on.     Do not shower for the first 24 hours.  You may shower after the first 24 hours.   Press button if you feel a symptom. You will hear a small click.  Record Date, Time and Symptom in the Patient Log Book.   When you are ready to remove patch, follow instructions on last 2 pages of Patient Log Book.  Stick patch monitor onto last page of Patient Log Book.   Place Patient Log Book in [place] box.  Use locking tab on box and tape box closed securely.  The [employer] and [employer] has [employer] on it.  Please place in mailbox as soon as possible.  Your physician should have your test results approximately 7 days after the monitor has been mailed back to [facility].   Call [facility] Customer Care at [phone] if you have questions regarding your ZIO XT patch monitor.  Call them immediately if you see an orange light blinking on your monitor.   If your monitor falls off in less than 4 days contact our Monitor department at [phone].  If your monitor becomes loose or falls  off after 4 days call [employer] at [phone] for suggestions on securing your monitor.    Follow-Up: At [facility], you and your health needs are our priority.  As part of our continuing mission to provide you with exceptional heart care, we have created designated Provider Care Teams.  These Care Teams include your primary Cardiologist (physician) and Advanced Practice Providers (APPs -  Physician Assistants and Nurse Practitioners) who all work together to provide you with the care you need, when you need it.  We recommend signing up for the patient portal called "MyChart".  Sign up information is provided on this After Visit Summary.  MyChart is used to connect with patients for Virtual Visits (Telemedicine).  Patients are able to view lab/test results, encounter notes, upcoming appointments, etc.  Non-urgent messages can be sent to your provider as well.   To learn more about what you can do with MyChart, go to [email or web address].    Your next appointment:   6 week(s)  The format for your next appointment:   In Person  Provider:    You may see Lorine Bears, MD or one of the following Advanced Practice Providers on your designated Care Team:    Nicolasa Ducking, NP  Eula Listen, PA-C  Marisue Ivan, PA-C      \\

## 2020-01-30 LAB — COMPREHENSIVE METABOLIC PANEL
ALT: 19 IU/L (ref 0–44)
AST: 22 IU/L (ref 0–40)
Albumin/Globulin Ratio: 1.6 (ref 1.2–2.2)
Albumin: 4.2 g/dL (ref 3.8–4.9)
Alkaline Phosphatase: 89 IU/L (ref 48–121)
BUN/Creatinine Ratio: 17 (ref 9–20)
BUN: 15 mg/dL (ref 6–24)
Bilirubin Total: 0.3 mg/dL (ref 0.0–1.2)
CO2: 22 mmol/L (ref 20–29)
Calcium: 9.1 mg/dL (ref 8.7–10.2)
Chloride: 103 mmol/L (ref 96–106)
Creatinine, Ser: 0.88 mg/dL (ref 0.76–1.27)
GFR calc Af Amer: 111 mL/min/{1.73_m2} (ref >59)
GFR calc non Af Amer: 96 mL/min/{1.73_m2} (ref >59)
Globulin, Total: 2.6 g/dL (ref 1.5–4.5)
Glucose: 91 mg/dL (ref 65–99)
Potassium: 4.4 mmol/L (ref 3.5–5.2)
Sodium: 139 mmol/L (ref 134–144)
Total Protein: 6.8 g/dL (ref 6.0–8.5)

## 2020-01-30 LAB — TSH: TSH: 1.24 u[IU]/mL (ref 0.450–4.500)

## 2020-01-30 LAB — LIPID PANEL
Chol/HDL Ratio: 4.2 ratio (ref 0.0–5.0)
Cholesterol, Total: 146 mg/dL (ref 100–199)
HDL: 35 mg/dL — ABNORMAL LOW
LDL Chol Calc (NIH): 85 mg/dL (ref 0–99)
Triglycerides: 150 mg/dL — ABNORMAL HIGH (ref 0–149)
VLDL Cholesterol Cal: 26 mg/dL (ref 5–40)

## 2020-02-10 ENCOUNTER — Ambulatory Visit: Payer: BC Managed Care – PPO | Admitting: Nurse Practitioner

## 2020-02-19 ENCOUNTER — Other Ambulatory Visit: Payer: Self-pay | Admitting: Cardiovascular Disease

## 2020-02-19 ENCOUNTER — Telehealth (INDEPENDENT_AMBULATORY_CARE_PROVIDER_SITE_OTHER): Payer: BC Managed Care – PPO | Admitting: Internal Medicine

## 2020-02-19 ENCOUNTER — Other Ambulatory Visit: Payer: Self-pay

## 2020-02-19 ENCOUNTER — Encounter: Payer: Self-pay | Admitting: Internal Medicine

## 2020-02-19 DIAGNOSIS — B9789 Other viral agents as the cause of diseases classified elsewhere: Secondary | ICD-10-CM | POA: Diagnosis not present

## 2020-02-19 DIAGNOSIS — J329 Chronic sinusitis, unspecified: Secondary | ICD-10-CM

## 2020-02-19 NOTE — Progress Notes (Signed)
Virtual Visit via Video Note  I connected with Steve Burnett on 02/19/20 at  3:00 PM EDT by a video enabled telemedicine application and verified that I am speaking with the correct person using two identifiers.  Location: Patient: Home Provider: Office   I discussed the limitations of evaluation and management by telemedicine and the availability of in person appointments. The patient expressed understanding and agreed to proceed.  HPI  Pt reports headache, facial pain and pressure, nasal congestion, fever and body aches. This started 5 days ago. The headache is located over his left eye. He describes the pain as throbbing. He is blowing clear mucous out of his nose. He denies dizziness, visual changes, eye pain, eye redness, runny nose, ear pain, sore throat, loss of taste/smell, cough or SOB. He reports fever up to 100.4, and had body aches but denies chills. He was treated for a sinus infection 6/7 with Azithromycin and Prednisone. He has had a negative Covid test within the last week. He reports he is actually feeling a little better. He has not had sick contacts or exposure to Covid that he is aware of.   Review of Systems     Past Medical History:  Diagnosis Date  . Anxiety   . Chicken pox   . Chronic combined systolic (congestive) and diastolic (congestive) heart failure (HCC)    a. 01/2018 Echo: EF 35-40%; b. 04/2018 Echo: EF 45-50%, diff HK, Gr2 DD.  Marland Kitchen Coronary artery disease    a. 02/2012 NSTEMI/Cath/PCI: pLAD 60%, mLAD 80%, dLAD 70, nonobs LCX/RCA --> DES to prox/mid LAD; b. inf STEMI 01/23/18: ost/proxLAD 75% at the prox edge of the previously placed stent, mLAD 75%, OM1 50%, OM2 50%, pRCA 100% s/p PCI/DES, rPLA 70%; c. 02/2018 MV: Ef 44%, apical inferior infarct. No ischemia.  . Hyperlipidemia   . Hypertension   . Ischemic cardiomyopathy    a. echo 7/19: EF 35-40%, HK of the inf wall, Gr1DD; b. 04/2018 Echo: EF 45-50%, diff HK, Gr2 DD, mildly dil Ao root/Asc Al. Mild MR. Mild  BAE. Nl RV fxn.  . Obesity   . OSA (obstructive sleep apnea) 03/27/2014  . PAF (paroxysmal atrial fibrillation) (HCC)    a. noted during [facility] 01/23/18 during inferior STEMI; b. resolved with IV amiodarone; c. not placed on anticoagulation given brief episode, acute illness, and need for DAPT  . PVC's (premature ventricular contractions)    a. 02/2018 Holter: Frq PVC's. No afib. Beta blocker titrated.  . VF (ventricular fibrillation) (HCC)    a. noted during [facility] 01/23/2018 s/p defibrillation x 2    Family History  Problem Relation Age of Onset  . Heart disease Mother   . Hyperlipidemia Mother   . Hypertension Mother   . Heart attack Mother 51       CABG X 4; past at age 16  . Heart disease Sister 72       s/p stents   . Stroke Father   . Cancer Neg Hx   . Diabetes Neg Hx     Social History   Socioeconomic History  . Marital status: Married    Spouse name: Not on file  . Number of children: Not on file  . Years of education: Not on file  . Highest education level: Not on file  Occupational History  . Occupation: Tourist information centre manager: [employer]  Tobacco Use  . Smoking status: Never Smoker  . Smokeless tobacco: Never Used  . Tobacco comment: Never used  tobacco products  Vaping Use  . Vaping Use: Never used  Substance and Sexual Activity  . Alcohol use: Yes    Alcohol/week: 2.0 standard drinks    Types: 2 Cans of beer per week    Comment: biweekly 1-2 beers  . Drug use: No  . Sexual activity: Not Currently  Other Topics Concern  . Not on file  Social History Narrative   Lives locally with his son, who has a h/o liver failure s/p prior liver transplant.  He is pending repeat transplant.   Social Determinants of Health   Financial Resource Strain:   . Difficulty of Paying Living Expenses:   Food Insecurity:   . Worried About Programme researcher, broadcasting/film/video in the Last Year:   . Barista in the Last Year:   Transportation Needs:   . Freight forwarder  (Medical):   Marland Kitchen Lack of Transportation (Non-Medical):   Physical Activity:   . Days of Exercise per Week:   . Minutes of Exercise per Session:   Stress:   . Feeling of Stress :   Social Connections:   . Frequency of Communication with Friends and Family:   . Frequency of Social Gatherings with Friends and Family:   . Attends Religious Services:   . Active Member of Clubs or Organizations:   . Attends Banker Meetings:   Marland Kitchen Marital Status:   Intimate Partner Violence:   . Fear of Current or Ex-Partner:   . Emotionally Abused:   Marland Kitchen Physically Abused:   . Sexually Abused:     Allergies  Allergen Reactions  . Atorvastatin Other (See Comments)    Muscle cramps   . Crestor [Rosuvastatin Calcium] Other (See Comments)    Cramps     Constitutional: Positive headache, fatigue and fever. Denies abrupt weight changes.  HEENT:  Positive facial pain, nasal congestion. Denies eye redness, ear pain, ringing in the ears, wax buildup, runny nose or sore throat. Respiratory: Denies cough, difficulty breathing or shortness of breath.  Cardiovascular: Denies chest pain, chest tightness, palpitations or swelling in the hands or feet.   No other specific complaints in a complete review of systems (except as listed in HPI above).  Objective:   General: Appears his stated age, obese in NAD. HEENT: Head: normal shape and size, left frontal sinus tenderness noted; Nose: no hoarseness; Throat/Mouth: no hoarseness noted. Pulmonary/Chest: Normal effort. No respiratory distress.       Assessment & Plan:   Viral Sinusitis:  Can use a Neti Pot which can be purchased from your local drug store. Flonase 2 sprays each nostril for 3 days and then as needed. Referral to ENT for further evaluation No indication for abx or Prednisone at this time  RTC as needed or if symptoms persist. [name], NP     I discussed the assessment and treatment plan with the patient. The patient was  provided an opportunity to ask questions and all were answered. The patient agreed with the plan and demonstrated an understanding of the instructions.   The patient was advised to call back or seek an in-person evaluation if the symptoms worsen or if the condition fails to improve as anticipated.    [name], NP

## 2020-02-19 NOTE — Patient Instructions (Signed)

## 2020-03-10 ENCOUNTER — Ambulatory Visit: Payer: BC Managed Care – PPO | Admitting: Nurse Practitioner

## 2020-03-17 ENCOUNTER — Encounter: Payer: Self-pay | Admitting: Nurse Practitioner

## 2020-03-17 ENCOUNTER — Other Ambulatory Visit: Payer: Self-pay

## 2020-03-17 ENCOUNTER — Ambulatory Visit (INDEPENDENT_AMBULATORY_CARE_PROVIDER_SITE_OTHER): Payer: BC Managed Care – PPO | Admitting: Nurse Practitioner

## 2020-03-17 VITALS — BP 140/90 | HR 71 | Ht 70.0 in | Wt 356.2 lb

## 2020-03-17 DIAGNOSIS — Z79899 Other long term (current) drug therapy: Secondary | ICD-10-CM

## 2020-03-17 DIAGNOSIS — I2510 Atherosclerotic heart disease of native coronary artery without angina pectoris: Secondary | ICD-10-CM

## 2020-03-17 DIAGNOSIS — I472 Ventricular tachycardia: Secondary | ICD-10-CM | POA: Diagnosis not present

## 2020-03-17 DIAGNOSIS — I4729 Other ventricular tachycardia: Secondary | ICD-10-CM

## 2020-03-17 DIAGNOSIS — E785 Hyperlipidemia, unspecified: Secondary | ICD-10-CM

## 2020-03-17 DIAGNOSIS — I493 Ventricular premature depolarization: Secondary | ICD-10-CM

## 2020-03-17 DIAGNOSIS — I5022 Chronic systolic (congestive) heart failure: Secondary | ICD-10-CM

## 2020-03-17 DIAGNOSIS — I255 Ischemic cardiomyopathy: Secondary | ICD-10-CM

## 2020-03-17 DIAGNOSIS — I10 Essential (primary) hypertension: Secondary | ICD-10-CM

## 2020-03-17 MED ORDER — SPIRONOLACTONE 25 MG PO TABS: 25.0000 mg | ORAL_TABLET | Freq: Every day | ORAL | 1 refills | Status: DC

## 2020-03-17 NOTE — Progress Notes (Signed)
Office Visit    Patient Name: Steve Burnett Date of Encounter: 03/17/2020  Primary Care Provider:  Lorre Munroe, NP Primary Cardiologist:  Lorine Bears, MD  Chief Complaint    59 year old male with a history of CAD status post non-STEMI in 2013, and inferior STEMI in 2019, hypertension, hyperlipidemia, ischemic cardiomyopathy, HFrEF, obesity, and sleep apnea, presents for follow-up related to palpitations and hypertension.  Past Medical History    Past Medical History:  Diagnosis Date  . Anxiety   . Chicken pox   . Chronic combined systolic (congestive) and diastolic (congestive) heart failure (HCC)    a. 01/2018 Echo: EF 35-40%; b. 04/2018 Echo: EF 45-50%, diff HK, Gr2 DD.  Marland Kitchen Coronary artery disease    a. 02/2012 NSTEMI/Cath/PCI: pLAD 60%, mLAD 80%, dLAD 70, nonobs LCX/RCA --> DES to prox/mid LAD; b. inf STEMI 01/23/18: ost/proxLAD 75% at the prox edge of the previously placed stent, mLAD 75%, OM1 50%, OM2 50%, pRCA 100% s/p PCI/DES, rPLA 70%; c. 02/2018 MV: Ef 44%, apical inferior infarct. No ischemia.  . Hyperlipidemia   . Hypertension   . Ischemic cardiomyopathy    a. echo 7/19: EF 35-40%, HK of the inf wall, Gr1DD; b. 04/2018 Echo: EF 45-50%, diff HK, Gr2 DD, mildly dil Ao root/Asc Al. Mild MR. Mild BAE. Nl RV fxn.  . Obesity   . OSA (obstructive sleep apnea) 03/27/2014  . PAF (paroxysmal atrial fibrillation) (HCC)    a. noted during [facility] 01/23/18 during inferior STEMI; b. resolved with IV amiodarone; c. not placed on anticoagulation given brief episode, acute illness, and need for DAPT  . PVC's (premature ventricular contractions)    a. 02/2018 Holter: Frq PVC's. No afib. Beta blocker titrated.  . VF (ventricular fibrillation) (HCC)    a. noted during [facility] 01/23/2018 s/p defibrillation x 2   Past Surgical History:  Procedure Laterality Date  . CARDIAC CATHETERIZATION  03/12/2012  . CORONARY ANGIOPLASTY  03/12/2012   s/p drug eluting stent to the mid LAD : 3.5 x 38 mm Xience  drug-eluting stent. Post dilated to 4.0 in mid segment and 4.5 proximally.  Rosalie Doctor ACUTE MI REVASCULARIZATION N/A 01/23/2018   Procedure: Coronary/Graft Acute MI Revascularization;  Surgeon: Marcina Millard, MD;  Location: [facility] INVASIVE CV LAB;  Service: Cardiovascular;  Laterality: N/A;  . LEFT HEART CATH AND CORONARY ANGIOGRAPHY N/A 01/23/2018   Procedure: LEFT HEART CATH AND CORONARY ANGIOGRAPHY;  Surgeon: Marcina Millard, MD;  Location: [facility] INVASIVE CV LAB;  Service: Cardiovascular;  Laterality: N/A;    Allergies  Allergies  Allergen Reactions  . Atorvastatin Other (See Comments)    Muscle cramps   . Crestor [Rosuvastatin Calcium] Other (See Comments)    Cramps    History of Present Illness    59 year old male with the above past medical history including coronary artery disease, hypertension, hyperlipidemia, ischemic cardiomyopathy, HFrEF, obesity, and sleep apnea. In August 2013, he suffered a non-STEMI in the setting of severe mid LAD disease requiring drug-eluting stent placement. In July 2019, he suffered an inferior STEMI and was found to have an occluded right coronary artery and underwent drug-eluting stent placement. This was complicated by VF arrest x2. He also had a brief episode of paroxysmal atrial fibrillation requiring amiodarone. Catheterization also showed moderate in-stent restenosis in the LAD, which have been medically managed. Echocardiogram during that admission showed an EF of 35 5 to 40% with inferior hypokinesis grade 1 diastolic dysfunction. Lexiscan Myoview was performed as an outpatient to evaluate the  ischemic burden of his LAD in August 2019, and this showed prior inferior infarct without ischemia. In the setting of palpitations, he underwent event monitoring at that time which did show frequent PVCs resulting in titration of beta-blocker therapy. Follow-up echocardiogram in October 2019 showed improvement in LV function with an EF of 45-50%, and  diffuse hypokinesis.  He was last seen in cardiology clinic on July 8, at which time he reported palpitations, sometimes lasting up to 20 minutes in duration. He felt that lisinopril and spironolactone may have been contributing as we cut these tablets in half. He was hypertensive and we opted to go back up on lisinopril 40 mg daily. A Zio monitor was placed given reports of prolonged arrhythmia/palpitations. This showed sinus rhythm with an average heart rate of 75 bpm. He did have 4 runs of ventricular tachycardia, the longest lasting 16 seconds with an average rate of 142 bpm. He also had 2 runs of SVT, the longest/fastest lasting 11 beats at a rate of 152 bpm. He otherwise had isolated PACs and PVCs. He did trigger the device with nonsustained VT. He says that since his last visit, he really has not had any prolonged palpitations and none were noted on monitoring. He has remained active at home, building an AV area for his [place]. He typically works from about 7 AM to noon outside without any chest pain or work limiting dyspnea. He denies PND, orthopnea, dizziness, syncope, edema, or early satiety.  Home Medications    Prior to Admission medications   Medication Sig Start Date End Date Taking? Authorizing Provider  aspirin 81 MG chewable tablet Chew 1 tablet (81 mg total) by mouth daily. 01/26/18  Yes Dunn, Raymon Mutton, PA-C  carvedilol (COREG) 25 MG tablet Take 1 tablet (25 mg total) by mouth 2 (two) times daily with a meal. 01/06/20  Yes Iran Ouch, MD  clopidogrel (PLAVIX) 75 MG tablet Take 75 mg by mouth daily. 03/16/20  Yes [provider]  ezetimibe (ZETIA) 10 MG tablet Take 1 tablet (10 mg total) by mouth daily. 01/06/20 04/05/20 Yes Iran Ouch, MD  lisinopril (ZESTRIL) 40 MG tablet Take 40 mg by mouth daily.    Yes [provider]  rosuvastatin (CRESTOR) 10 MG tablet TAKE 1 TABLET BY MOUTH EVERY DAY AT 6PM 01/06/20  Yes Iran Ouch, MD  spironolactone (ALDACTONE)  25 MG tablet TAKE 1/2 TABLET BY MOUTH EVERY DAY 02/19/20  Yes Creig Hines, NP    Review of Systems    Ongoing intermittent palpitations which have improved since his last visit. He has some degree of dyspnea on exertion though this has been stable. He denies chest pain, PND, orthopnea, dizziness, syncope, edema, or early satiety.  All other systems reviewed and are otherwise negative except as noted above.  Physical Exam    VS:  BP 140/90 (BP Location: Left Arm, Patient Position: Sitting, Cuff Size: Large)   Pulse 71   Ht 5\' 10"  (1.778 m)   Wt (!) 356 lb 3.2 oz (161.6 kg)   SpO2 97%   BMI 51.11 kg/m  , BMI Body mass index is 51.11 kg/m. GEN: Obese, in no acute distress. HEENT: normal. Neck: Supple, obese, difficult to gauge JVP. No carotid bruits, or masses. Cardiac: RRR, no murmurs, rubs, or gallops. No clubbing, cyanosis, edema.  Radials/PT 2+ and equal bilaterally.  Respiratory:  Respirations regular and unlabored, clear to auscultation bilaterally. GI: Obese, protuberant, soft, nontender, nondistended, BS + x 4. MS:  no deformity or atrophy. Skin: warm and dry, no rash. Neuro:  Strength and sensation are intact. Psych: Normal affect.  Accessory Clinical Findings    ECG personally reviewed by me today -regular sinus rhythm, 71, prior septal infarct- no acute changes.  Lab Results  Component Value Date   WBC 6.8 10/09/2019   HGB 13.5 10/09/2019   HCT 41.9 10/09/2019   MCV 80.3 10/09/2019   PLT 269 10/09/2019   Lab Results  Component Value Date   CREATININE 0.88 01/29/2020   BUN 15 01/29/2020   NA 139 01/29/2020   K 4.4 01/29/2020   CL 103 01/29/2020   CO2 22 01/29/2020   Lab Results  Component Value Date   ALT 19 01/29/2020   AST 22 01/29/2020   ALKPHOS 89 01/29/2020   BILITOT 0.3 01/29/2020   Lab Results  Component Value Date   CHOL 146 01/29/2020   HDL 35 (L) 01/29/2020   LDLCALC 85 01/29/2020   LDLDIRECT 79.0 08/08/2016   TRIG 150 (H)  01/29/2020   CHOLHDL 4.2 01/29/2020    Lab Results  Component Value Date   HGBA1C 6.0 (H) 01/23/2018    Assessment & Plan    1. Nonsustained VT/PVCs: Patient with previously documented symptomatic PVCs. At his last office visit, he reported more prolonged episodes of palpitations lasting up to 20 minutes and so I placed another monitor. He did not have any prolonged arrhythmias but was noted to have symptomatic runs of nonsustained VT, the longest lasting 16 seconds at a rate of 142 bpm. He denies presyncope or syncope. He is already on max dose metoprolol therapy. He has chronic, stable dyspnea on exertion with an EF of 45 to 50% by echo in October 2019. He has not been having any chest pain. I will arrange for follow-up echocardiogram to reevaluate LV function in the setting of symptomatic nonsustained VT. We will also need to consider repeat ischemic evaluation pending echo results.  2. Coronary artery disease: Status post prior LAD and RCA PCI. He has known residual in-stent restenosis in the LAD but did not have any anterior ischemia by stress testing in 2019. He has not been have any chest pain and has chronic, stable dyspnea exertion. Continue aspirin, beta-blocker, Plavix, Zetia, ACE inhibitor, and statin therapy.  3. Essential hypertension: Blood pressure elevated today at 140/90. I will increase his Aldactone back to 25 mg daily. He otherwise remains on lisinopril 40 and carvedilol 25. We discussed the importance of regular activity and weight loss in blood pressure management. He prefers to avoid any new medications as he feels as though medicines in general make him feel worse.  4. Hyperlipidemia: LDL of 85 in July. Statin dose has been limited by myalgias and is currently on 10 mg of rosuvastatin daily. He is also on Zetia 10 mg daily. We did discuss possibly adding Repatha. He would like to think about it and try and lose weight first.  5. Ischemic cardiomyopathy/heart failure with  midrange ejection fraction: As noted above, EF 45-50% by echo in October 2019. His chronic, stable dyspnea exertion. No significant evidence of volume overload today. Continue beta-blocker and ACE inhibitor therapy. Following up echo as outlined in #1. Could consider switching lisinopril to [facility] after a brief hold. Continue spironolactone therapy, which is being titrated today.  6. Obstructive sleep apnea: Reports compliance with CPAP.  7. H/o Afib: Occurred after MI. With report of prolonged palpitations at his last visit, I placed a monitor however, did not  see any atrial fibrillation. He remains on beta-blocker therapy.   8. Disposition: Follow-up bmet in approximately 1 week given titration of spironolactone to 25 mg daily. Follow-up echo. Follow-up in clinic in approximately 2 to 3 months or sooner if necessary.   Nicolasa Duckinghristopher Raigen Jagielski, NP 03/17/2020, 3:13 PM

## 2020-03-17 NOTE — Patient Instructions (Signed)
Medication Instructions:  - Your physician has recommended you make the following change in your medication:   1) INCREASE spironolactone 25 mg- take 1 whole tablet once daily   *If you need a refill on your cardiac medications before your next appointment, please call your pharmacy*   Lab Work: - Your physician recommends that you return for lab work in: 1 week- BMP - come to the [facility] entrance at [facility], 1st day on the right (Registration) to check in, just past the screening table - No appointment needed - Lab hours: Monday- Friday (7:30 am- 5:30 pm)  If you have labs (blood work) drawn today and your tests are completely normal, you will receive your results only by: Marland Kitchen MyChart Message (if you have MyChart) OR . A paper copy in the mail If you have any lab test that is abnormal or we need to change your treatment, we will call you to review the results.   Testing/Procedures: - Your physician has requested that you have an echocardiogram. Echocardiography is a painless test that uses sound waves to create images of your heart. It provides your doctor with information about the size and shape of your heart and how well your heart's chambers and valves are working. This procedure takes approximately one hour. There are no restrictions for this procedure. There is a possibility that an IV may need to be started during your test to inject an image enhancing agent. This is done to obtain more optimal pictures of your heart. Therefore we ask that you do at least drink some water prior to coming in to hydrate your veins.    Follow-Up: At [facility], you and your health needs are our priority.  As part of our continuing mission to provide you with exceptional heart care, we have created designated Provider Care Teams.  These Care Teams include your primary Cardiologist (physician) and Advanced Practice Providers (APPs -  Physician Assistants and Nurse Practitioners) who all work together  to provide you with the care you need, when you need it.  We recommend signing up for the patient portal called "MyChart".  Sign up information is provided on this After Visit Summary.  MyChart is used to connect with patients for Virtual Visits (Telemedicine).  Patients are able to view lab/test results, encounter notes, upcoming appointments, etc.  Non-urgent messages can be sent to your provider as well.   To learn more about what you can do with MyChart, go to [email or web address].    Your next appointment:   3 month(s)  The format for your next appointment:   In Person  Provider:   Lorine Bears, MD/ Ward Givens, NP   Other Instructions    Echocardiogram An echocardiogram is a procedure that uses painless sound waves (ultrasound) to produce an image of the heart. Images from an echocardiogram can provide important information about:  Signs of coronary artery disease (CAD).  Aneurysm detection. An aneurysm is a weak or damaged part of an artery wall that bulges out from the normal force of blood pumping through the body.  Heart size and shape. Changes in the size or shape of the heart can be associated with certain conditions, including heart failure, aneurysm, and CAD.  Heart muscle function.  Heart valve function.  Signs of a past heart attack.  Fluid buildup around the heart.  Thickening of the heart muscle.  A tumor or infectious growth around the heart valves. Tell a health care provider about:  Any allergies you  have.  All medicines you are taking, including vitamins, herbs, eye drops, creams, and over-the-counter medicines.  Any blood disorders you have.  Any surgeries you have had.  Any medical conditions you have.  Whether you are pregnant or may be pregnant. What are the risks? Generally, this is a safe procedure. However, problems may occur, including:  Allergic reaction to dye (contrast) that may be used during the procedure. What happens  before the procedure? No specific preparation is needed. You may eat and drink normally. What happens during the procedure?   An IV tube may be inserted into one of your veins.  You may receive contrast through this tube. A contrast is an injection that improves the quality of the pictures from your heart.  A gel will be applied to your chest.  A wand-like tool (transducer) will be moved over your chest. The gel will help to transmit the sound waves from the transducer.  The sound waves will harmlessly bounce off of your heart to allow the heart images to be captured in real-time motion. The images will be recorded on a computer. The procedure may vary among health care providers and hospitals. What happens after the procedure?  You may return to your normal, everyday life, including diet, activities, and medicines, unless your health care provider tells you not to do that. Summary  An echocardiogram is a procedure that uses painless sound waves (ultrasound) to produce an image of the heart.  Images from an echocardiogram can provide important information about the size and shape of your heart, heart muscle function, heart valve function, and fluid buildup around your heart.  You do not need to do anything to prepare before this procedure. You may eat and drink normally.  After the echocardiogram is completed, you may return to your normal, everyday life, unless your health care provider tells you not to do that. This information is not intended to replace advice given to you by your health care provider. Make sure you discuss any questions you have with your health care provider. Document Revised: 10/31/2018 Document Reviewed: 08/12/2016 Elsevier Patient Education  2020 [employer].

## 2020-03-23 ENCOUNTER — Other Ambulatory Visit: Payer: Self-pay

## 2020-03-23 ENCOUNTER — Ambulatory Visit (INDEPENDENT_AMBULATORY_CARE_PROVIDER_SITE_OTHER): Payer: BC Managed Care – PPO

## 2020-03-23 DIAGNOSIS — I472 Ventricular tachycardia: Secondary | ICD-10-CM | POA: Diagnosis not present

## 2020-03-23 DIAGNOSIS — I4729 Other ventricular tachycardia: Secondary | ICD-10-CM

## 2020-03-23 LAB — ECHOCARDIOGRAM COMPLETE
Area-P 1/2: 2.42 cm2
Calc EF: 38.4 %
S' Lateral: 4.1 cm
Single Plane A2C EF: 21.2 %
Single Plane A4C EF: 52.4 %

## 2020-03-23 MED ORDER — PERFLUTREN LIPID MICROSPHERE
1.0000 mL | INTRAVENOUS | Status: AC | PRN
  Administered 2020-03-23: 2 mL via INTRAVENOUS

## 2020-03-24 ENCOUNTER — Telehealth: Payer: Self-pay

## 2020-03-24 NOTE — Telephone Encounter (Signed)
Attempted to call patient. LMTCB 03/24/2020

## 2020-03-24 NOTE — Telephone Encounter (Signed)
-----   Message from Creig Hines, NP sent at 03/24/2020  7:57 AM EDT ----- Heart squeezing function remains mildly to moderately reduced at 45-50%.  The heart remains stiff, again making BP control very important.  The mitral valve is mildly to moderately leaky.  Right sided pressures are moderately elevated. Cont higher dose of spironolactone, which we increased at last visit.  F/u bmet as prev planned (was to have this week).

## 2020-04-06 NOTE — Telephone Encounter (Signed)
Call to patient to review echo results.    Pt verbalized understanding and has no further questions at this time.    Advised pt to call for any further questions or concerns.  Pt will attempt to get labs. Last time he went there was a long line that he did not want to wait for.  Follow up as planned.

## 2020-04-08 ENCOUNTER — Encounter: Payer: Self-pay | Admitting: Internal Medicine

## 2020-04-08 ENCOUNTER — Other Ambulatory Visit: Payer: Self-pay

## 2020-04-08 ENCOUNTER — Ambulatory Visit (INDEPENDENT_AMBULATORY_CARE_PROVIDER_SITE_OTHER): Payer: BC Managed Care – PPO | Admitting: Internal Medicine

## 2020-04-08 VITALS — BP 138/86 | HR 68 | Temp 97.8°F | Ht 70.0 in | Wt 356.0 lb

## 2020-04-08 DIAGNOSIS — N401 Enlarged prostate with lower urinary tract symptoms: Secondary | ICD-10-CM

## 2020-04-08 DIAGNOSIS — F419 Anxiety disorder, unspecified: Secondary | ICD-10-CM | POA: Diagnosis not present

## 2020-04-08 DIAGNOSIS — R351 Nocturia: Secondary | ICD-10-CM

## 2020-04-08 DIAGNOSIS — Z0001 Encounter for general adult medical examination with abnormal findings: Secondary | ICD-10-CM

## 2020-04-08 DIAGNOSIS — L301 Dyshidrosis [pompholyx]: Secondary | ICD-10-CM

## 2020-04-08 DIAGNOSIS — Z125 Encounter for screening for malignant neoplasm of prostate: Secondary | ICD-10-CM | POA: Diagnosis not present

## 2020-04-08 DIAGNOSIS — I10 Essential (primary) hypertension: Secondary | ICD-10-CM

## 2020-04-08 DIAGNOSIS — I2510 Atherosclerotic heart disease of native coronary artery without angina pectoris: Secondary | ICD-10-CM

## 2020-04-08 DIAGNOSIS — G4733 Obstructive sleep apnea (adult) (pediatric): Secondary | ICD-10-CM

## 2020-04-08 DIAGNOSIS — E782 Mixed hyperlipidemia: Secondary | ICD-10-CM | POA: Diagnosis not present

## 2020-04-08 DIAGNOSIS — I2119 ST elevation (STEMI) myocardial infarction involving other coronary artery of inferior wall: Secondary | ICD-10-CM

## 2020-04-08 MED ORDER — MOMETASONE FUROATE 0.1 % EX OINT: TOPICAL_OINTMENT | Freq: Every day | CUTANEOUS | 0 refills | Status: DC

## 2020-04-08 MED ORDER — ALPRAZOLAM 0.5 MG PO TABS: 0.5000 mg | ORAL_TABLET | Freq: Every day | ORAL | 0 refills | Status: DC | PRN

## 2020-04-08 MED ORDER — CITALOPRAM HYDROBROMIDE 10 MG PO TABS: 10.0000 mg | ORAL_TABLET | Freq: Every day | ORAL | 2 refills | Status: DC

## 2020-04-08 MED ORDER — TAMSULOSIN HCL 0.4 MG PO CAPS: 0.4000 mg | ORAL_CAPSULE | Freq: Every day | ORAL | 3 refills | Status: DC

## 2020-04-08 NOTE — Progress Notes (Signed)
Subjective:    Patient ID: Steve Burnett, male    DOB: 09-06-1960, 59 y.o.   MRN: [id number]  HPI  Pt presents to the clinic today for his annual exam. He is also due to follow up chronic conditions.  Anxiety: Deteriorated. He feels like this is related to financial stress, general life stress. He is not currently taking any medications for this, but would like to start back on Citalopram. He would like a RX for Xanax for the first month while the Citalopram is getting in his system.  He is not currently seeing a therapist. He denies SI/HI.  HLD with CAD s/p STEMI: His last LDL was 125, 09/2019. He is taking Rosuvastatin, Zetia, ASA, Plavix as prescribed. He does consumes fried foods. He follows with endocrinology.  HTN: His BP today is 138/86. He is taking Carvedilol, Lisinopril and Aldactone as prescribed.  OSA: He averages 7 hours of sleep per night with the use of his CPAP. Sleep study from 04/2014 reviewed.  He also reports dry skin of bilateral hands. This has been going on for years. He has tried multiple OTC creams without any relief of symptoms.   He also reports nocturia. He reports he gets up 7-8 times per night to urinate. He does not feel like he has urinary frequency during the day. He does have some decreased libido but does not really report ED. He has not taken anything OTC for this.   Flu: 05/2019 Tetanus: 08/2014 Covid: Moderna PSA Screening: 2018 Colon Screening: never Vision Screening: as needed Dentist: annually  Diet: He does eat meat. He consumes fruits and veggies daily. He does consume some fried foods. He drinks mostly water, some soda. Exercise: Working on the farm  Review of Systems      Past Medical History:  Diagnosis Date  . Anxiety   . Chicken pox   . Chronic combined systolic (congestive) and diastolic (congestive) heart failure (HCC)    a. 01/2018 Echo: EF 35-40%; b. 04/2018 Echo: EF 45-50%, diff HK, Gr2 DD.  Marland Kitchen Coronary artery disease     a. 02/2012 NSTEMI/Cath/PCI: pLAD 60%, mLAD 80%, dLAD 70, nonobs LCX/RCA --> DES to prox/mid LAD; b. inf STEMI 01/23/18: ost/proxLAD 75% at the prox edge of the previously placed stent, mLAD 75%, OM1 50%, OM2 50%, pRCA 100% s/p PCI/DES, rPLA 70%; c. 02/2018 MV: Ef 44%, apical inferior infarct. No ischemia.  . Hyperlipidemia   . Hypertension   . Ischemic cardiomyopathy    a. echo 7/19: EF 35-40%, HK of the inf wall, Gr1DD; b. 04/2018 Echo: EF 45-50%, diff HK, Gr2 DD, mildly dil Ao root/Asc Al. Mild MR. Mild BAE. Nl RV fxn.  . Obesity   . OSA (obstructive sleep apnea) 03/27/2014  . PAF (paroxysmal atrial fibrillation) (HCC)    a. noted during [facility] 01/23/18 during inferior STEMI; b. resolved with IV amiodarone; c. not placed on anticoagulation given brief episode, acute illness, and need for DAPT  . PVC's (premature ventricular contractions)    a. 02/2018 Holter: Frq PVC's. No afib. Beta blocker titrated.  . VF (ventricular fibrillation) (HCC)    a. noted during [facility] 01/23/2018 s/p defibrillation x 2    Current Outpatient Medications  Medication Sig Dispense Refill  . aspirin 81 MG chewable tablet Chew 1 tablet (81 mg total) by mouth daily. 30 tablet 11  . carvedilol (COREG) 25 MG tablet Take 1 tablet (25 mg total) by mouth 2 (two) times daily with a meal. 180 tablet 2  .  clopidogrel (PLAVIX) 75 MG tablet Take 75 mg by mouth daily.    Marland Kitchen. ezetimibe (ZETIA) 10 MG tablet Take 1 tablet (10 mg total) by mouth daily. 90 tablet 3  . lisinopril (ZESTRIL) 40 MG tablet Take 40 mg by mouth daily.     . rosuvastatin (CRESTOR) 10 MG tablet TAKE 1 TABLET BY MOUTH EVERY DAY AT 6PM 90 tablet 2  . spironolactone (ALDACTONE) 25 MG tablet Take 1 tablet (25 mg total) by mouth daily. 90 tablet 1   No current facility-administered medications for this visit.    Allergies  Allergen Reactions  . Atorvastatin Other (See Comments)    Muscle cramps   . Crestor [Rosuvastatin Calcium] Other (See Comments)    Cramps    Family  History  Problem Relation Age of Onset  . Heart disease Mother   . Hyperlipidemia Mother   . Hypertension Mother   . Heart attack Mother [id number]       CABG X 4; past at age 57  . Heart disease Sister 155       s/p stents   . Stroke Father   . Cancer Neg Hx   . Diabetes Neg Hx     Social History   Socioeconomic History  . Marital status: Married    Spouse name: Not on file  . Number of children: Not on file  . Years of education: Not on file  . Highest education level: Not on file  Occupational History  . Occupation: Tourist information centre managerlectronic Technician    Employer: [employer]  Tobacco Use  . Smoking status: Never Smoker  . Smokeless tobacco: Never Used  . Tobacco comment: Never used tobacco products  Vaping Use  . Vaping Use: Never used  Substance and Sexual Activity  . Alcohol use: Yes    Alcohol/week: 2.0 standard drinks    Types: 2 Cans of beer per week    Comment: biweekly 1-2 beers  . Drug use: No  . Sexual activity: Not Currently  Other Topics Concern  . Not on file  Social History Narrative   Lives locally with his son, who has a h/o liver failure s/p prior liver transplant.  He is pending repeat transplant.   Social Determinants of Health   Financial Resource Strain:   . Difficulty of Paying Living Expenses: Not on file  Food Insecurity:   . Worried About Programme researcher, broadcasting/film/videounning Out of Food in the Last Year: Not on file  . Ran Out of Food in the Last Year: Not on file  Transportation Needs:   . Lack of Transportation (Medical): Not on file  . Lack of Transportation (Non-Medical): Not on file  Physical Activity:   . Days of Exercise per Week: Not on file  . Minutes of Exercise per Session: Not on file  Stress:   . Feeling of Stress : Not on file  Social Connections:   . Frequency of Communication with Friends and Family: Not on file  . Frequency of Social Gatherings with Friends and Family: Not on file  . Attends Religious Services: Not on file  . Active Member of Clubs or  Organizations: Not on file  . Attends BankerClub or Organization Meetings: Not on file  . Marital Status: Not on file  Intimate Partner Violence:   . Fear of Current or Ex-Partner: Not on file  . Emotionally Abused: Not on file  . Physically Abused: Not on file  . Sexually Abused: Not on file     Constitutional: Denies fever, malaise, fatigue,  headache or abrupt weight changes.  HEENT: Denies eye pain, eye redness, ear pain, ringing in the ears, wax buildup, runny nose, nasal congestion, bloody nose, or sore throat. Respiratory: Denies difficulty breathing, shortness of breath, cough or sputum production.   Cardiovascular: Denies chest pain, chest tightness, palpitations or swelling in the hands or feet.  Gastrointestinal: Denies abdominal pain, bloating, constipation, diarrhea or blood in the stool.  GU: Pt reports nocturia. Denies urgency, frequency, pain with urination, burning sensation, blood in urine, odor or discharge. Musculoskeletal: Denies decrease in range of motion, difficulty with gait, muscle pain or joint pain and swelling.  Skin: Pt reports extremely dry skin of hands. Denies redness, lesions or ulcercations.  Neurological: Denies dizziness, difficulty with memory, difficulty with speech or problems with balance and coordination.  Psych: Pt reports a history of anxiety. Denies \\depression , SI/HI.  No other specific complaints in a complete review of systems (except as listed in HPI above).  Objective:   Physical Exam   BP 138/86   Pulse 68   Temp 97.8 F (36.6 C) (Temporal)   Ht 5\' 10"  (1.778 m)   Wt (!) 356 lb (161.5 kg)   SpO2 97%   BMI 51.08 kg/m   Wt Readings from Last 3 Encounters:  03/17/20 (!) 356 lb 3.2 oz (161.6 kg)  01/29/20 (!) 352 lb (159.7 kg)  10/09/19 (!) 354 lb (160.6 kg)    General: Appears his stated age, obese in NAD. Skin: Warm, dry and intact. Dyshidrotic eczema noted of BLE. HEENT: Head: normal shape and size; Eyes: sclera white, no  icterus, conjunctiva pink, PERRLA and EOMs intact; Neck:  Neck supple, trachea midline. No masses, lumps or thyromegaly present.  Cardiovascular: Normal rate and rhythm. S1,S2 noted.  No murmur, rubs or gallops noted. No JVD or BLE edema. No carotid bruits noted. Pulmonary/Chest: Normal effort and positive vesicular breath sounds. No respiratory distress. No wheezes, rales or ronchi noted.  Abdomen:  Normal bowel sounds.  Musculoskeletal: Strength 5/5 BUE/BLE. No difficulty with gait.  Neurological: Alert and oriented. Cranial nerves II-XII grossly intact. Coordination normal.  Psychiatric: Mood and affect normal. Behavior is normal. Judgment and thought content normal.    BMET    Component Value Date/Time   NA 139 01/29/2020 1221   NA 138 02/02/2014 0718   K 4.4 01/29/2020 1221   K 3.9 02/02/2014 0718   CL 103 01/29/2020 1221   CL 105 02/02/2014 0718   CO2 22 01/29/2020 1221   CO2 25 02/02/2014 0718   GLUCOSE 91 01/29/2020 1221   GLUCOSE 128 (H) 10/09/2019 1701   GLUCOSE 114 (H) 02/02/2014 0718   BUN 15 01/29/2020 1221   BUN 16 02/02/2014 0718   CREATININE 0.88 01/29/2020 1221   CREATININE 0.83 02/02/2014 0718   CALCIUM 9.1 01/29/2020 1221   CALCIUM 8.5 02/02/2014 0718   GFRNONAA 96 01/29/2020 1221   GFRNONAA >60 02/02/2014 0718   GFRAA 111 01/29/2020 1221   GFRAA >60 02/02/2014 0718    Lipid Panel     Component Value Date/Time   CHOL 146 01/29/2020 1221   CHOL 167 03/11/2012 0645   TRIG 150 (H) 01/29/2020 1221   TRIG 98 03/11/2012 0645   HDL 35 (L) 01/29/2020 1221   HDL 41 03/11/2012 0645   CHOLHDL 4.2 01/29/2020 1221   CHOLHDL 5.7 10/09/2019 1701   VLDL 36 10/09/2019 1701   VLDL 20 03/11/2012 0645   LDLCALC 85 01/29/2020 1221   LDLCALC 106 (H) 03/11/2012 0645  CBC    Component Value Date/Time   WBC 6.8 10/09/2019 1701   RBC 5.22 10/09/2019 1701   HGB 13.5 10/09/2019 1701   HGB 13.4 02/02/2014 0718   HCT 41.9 10/09/2019 1701   HCT 41.1 02/02/2014 0718     PLT 269 10/09/2019 1701   PLT 233 02/02/2014 0718   MCV 80.3 10/09/2019 1701   MCV 80 02/02/2014 0718   MCH 25.9 (L) 10/09/2019 1701   MCHC 32.2 10/09/2019 1701   RDW 15.1 10/09/2019 1701   RDW 15.7 (H) 02/02/2014 0718   LYMPHSABS 1.9 10/09/2019 1701   LYMPHSABS 0.9 (L) 03/10/2012 1240   MONOABS 0.5 10/09/2019 1701   MONOABS 0.4 03/10/2012 1240   EOSABS 0.2 10/09/2019 1701   EOSABS 0.0 03/10/2012 1240   BASOSABS 0.1 10/09/2019 1701   BASOSABS 0.0 03/10/2012 1240    Hgb A1C Lab Results  Component Value Date   HGBA1C 6.0 (H) 01/23/2018           Assessment & Plan:   Preventative Health Maintenance:  He will get his flu shot with his employer Tetanus UTD Covid UTD He declines referral for colonoscopy at this time Encouraged him to consume a balanced diet and exercise regimen Advised him to see an eye doctor and dentist annually Will check CBC, CMET, Lipid, A1C and PSA today  Update me in 1 month and let me know how you are doing on the Citaloprm   [name], NP This visit occurred during the SARS-CoV-2 public health emergency.  Safety protocols were in place, including screening questions prior to the visit, additional usage of staff PPE, and extensive cleaning of exam room while observing appropriate contact time as indicated for disinfecting solutions.

## 2020-04-09 DIAGNOSIS — L301 Dyshidrosis [pompholyx]: Secondary | ICD-10-CM | POA: Insufficient documentation

## 2020-04-09 DIAGNOSIS — N400 Enlarged prostate without lower urinary tract symptoms: Secondary | ICD-10-CM | POA: Insufficient documentation

## 2020-04-09 LAB — CBC
HCT: 42.3 % (ref 39.0–52.0)
Hemoglobin: 13.7 g/dL (ref 13.0–17.0)
MCHC: 32.3 g/dL (ref 30.0–36.0)
MCV: 79.5 fl (ref 78.0–100.0)
Platelets: 262 10*3/uL (ref 150.0–400.0)
RBC: 5.32 Mil/uL (ref 4.22–5.81)
RDW: 15.6 % — ABNORMAL HIGH (ref 11.5–15.5)
WBC: 8.7 10*3/uL (ref 4.0–10.5)

## 2020-04-09 LAB — COMPREHENSIVE METABOLIC PANEL
ALT: 22 U/L (ref 0–53)
AST: 20 U/L (ref 0–37)
Albumin: 4.1 g/dL (ref 3.5–5.2)
Alkaline Phosphatase: 69 U/L (ref 39–117)
BUN: 13 mg/dL (ref 6–23)
CO2: 26 mEq/L (ref 19–32)
Calcium: 8.8 mg/dL (ref 8.4–10.5)
Chloride: 104 mEq/L (ref 96–112)
Creatinine, Ser: 0.8 mg/dL (ref 0.40–1.50)
GFR: 99.68 mL/min (ref >60.00)
Glucose, Bld: 87 mg/dL (ref 70–99)
Potassium: 4.2 mEq/L (ref 3.5–5.1)
Sodium: 136 mEq/L (ref 135–145)
Total Bilirubin: 0.6 mg/dL (ref 0.2–1.2)
Total Protein: 6.9 g/dL (ref 6.0–8.3)

## 2020-04-09 LAB — LIPID PANEL
Cholesterol: 141 mg/dL (ref 0–200)
HDL: 34.6 mg/dL — ABNORMAL LOW (ref >39.00)
LDL Cholesterol: 76 mg/dL (ref 0–99)
NonHDL: 106.09
Total CHOL/HDL Ratio: 4
Triglycerides: 152 mg/dL — ABNORMAL HIGH (ref 0.0–149.0)
VLDL: 30.4 mg/dL (ref 0.0–40.0)

## 2020-04-09 LAB — HEMOGLOBIN A1C: Hgb A1c MFr Bld: 6.8 % — ABNORMAL HIGH (ref 4.6–6.5)

## 2020-04-09 LAB — PSA: PSA: 0.2 ng/mL (ref 0.10–4.00)

## 2020-04-09 NOTE — Patient Instructions (Signed)
Health Maintenance After Age 57 After age 57, you are at a higher risk for certain long-term diseases and infections as well as injuries from falls. Falls are a major cause of broken bones and head injuries in people who are older than age 57. Getting regular preventive care can help to keep you healthy and well. Preventive care includes getting regular testing and making lifestyle changes as recommended by your health care provider. Talk with your health care provider about:  Which screenings and tests you should have. A screening is a test that checks for a disease when you have no symptoms.  A diet and exercise plan that is right for you. What should I know about screenings and tests to prevent falls? Screening and testing are the best ways to find a health problem early. Early diagnosis and treatment give you the best chance of managing medical conditions that are common after age 57. Certain conditions and lifestyle choices may make you more likely to have a fall. Your health care provider may recommend:  Regular vision checks. Poor vision and conditions such as cataracts can make you more likely to have a fall. If you wear glasses, make sure to get your prescription updated if your vision changes.  Medicine review. Work with your health care provider to regularly review all of the medicines you are taking, including over-the-counter medicines. Ask your health care provider about any side effects that may make you more likely to have a fall. Tell your health care provider if any medicines that you take make you feel dizzy or sleepy.  Osteoporosis screening. Osteoporosis is a condition that causes the bones to get weaker. This can make the bones weak and cause them to break more easily.  Blood pressure screening. Blood pressure changes and medicines to control blood pressure can make you feel dizzy.  Strength and balance checks. Your health care provider may recommend certain tests to check your  strength and balance while standing, walking, or changing positions.  Foot health exam. Foot pain and numbness, as well as not wearing proper footwear, can make you more likely to have a fall.  Depression screening. You may be more likely to have a fall if you have a fear of falling, feel emotionally low, or feel unable to do activities that you used to do.  Alcohol use screening. Using too much alcohol can affect your balance and may make you more likely to have a fall. What actions can I take to lower my risk of falls? General instructions  Talk with your health care provider about your risks for falling. Tell your health care provider if: ? You fall. Be sure to tell your health care provider about all falls, even ones that seem minor. ? You feel dizzy, sleepy, or off-balance.  Take over-the-counter and prescription medicines only as told by your health care provider. These include any supplements.  Eat a healthy diet and maintain a healthy weight. A healthy diet includes low-fat dairy products, low-fat (lean) meats, and fiber from whole grains, beans, and lots of fruits and vegetables. Home safety  Remove any tripping hazards, such as rugs, cords, and clutter.  Install safety equipment such as grab bars in bathrooms and safety rails on stairs.  Keep rooms and walkways well-lit. Activity   Follow a regular exercise program to stay fit. This will help you maintain your balance. Ask your health care provider what types of exercise are appropriate for you.  If you need a cane or   walker, use it as recommended by your health care provider.  Wear supportive shoes that have nonskid soles. Lifestyle  Do not drink alcohol if your health care provider tells you not to drink.  If you drink alcohol, limit how much you have: ? 0-1 drink a day for women. ? 0-2 drinks a day for men.  Be aware of how much alcohol is in your drink. In the [place] one drink equals one typical bottle of beer (12  oz), one-half glass of wine (5 oz), or one shot of hard liquor (1 oz).  Do not use any products that contain nicotine or tobacco, such as cigarettes and e-cigarettes. If you need help quitting, ask your health care provider. Summary  Having a healthy lifestyle and getting preventive care can help to protect your health and wellness after age 57.  Screening and testing are the best way to find a health problem early and help you avoid having a fall. Early diagnosis and treatment give you the best chance for managing medical conditions that are more common for people who are older than age 57.  Falls are a major cause of broken bones and head injuries in people who are older than age 57. Take precautions to prevent a fall at home.  Work with your health care provider to learn what changes you can make to improve your health and wellness and to prevent falls. This information is not intended to replace advice given to you by your health care provider. Make sure you discuss any questions you have with your health care provider. Document Revised: 10/31/2018 Document Reviewed: 05/23/2017 Elsevier Patient Education  2020 Elsevier Inc.  

## 2020-04-09 NOTE — Assessment & Plan Note (Signed)
Deteriorated Will restart Citalopram 10 mg daily, RX sent to pharmacy Will give Xanax x 1 month per request, #20, 0 refills Support offered

## 2020-04-09 NOTE — Assessment & Plan Note (Signed)
No angina Continue Carvedilol, Lisinopril, Rosuvastatin, Ezetimibe, ASA and Plavix Encouraged low fat diet, exercise for weight loss He will continue to follow with cardiology 

## 2020-04-09 NOTE — Assessment & Plan Note (Signed)
CMET and Lipid profile today Encouraged him to consume a low fat diet Continue Rosuvastatin and Ezetimibe

## 2020-04-09 NOTE — Assessment & Plan Note (Signed)
Will trial Mometasone Avoid hand sanitize, excessive hand washing with hot water

## 2020-04-09 NOTE — Assessment & Plan Note (Signed)
Controlled on Carvedilol, Lisinopril and Aldactone Reinforced DASH diet and exercise for weight loss CMET today Will monitor

## 2020-04-09 NOTE — Assessment & Plan Note (Signed)
No angina Continue Carvedilol, Lisinopril, Rosuvastatin, Ezetimibe, ASA and Plavix Encouraged low fat diet, exercise for weight loss He will continue to follow with cardiology

## 2020-04-09 NOTE — Assessment & Plan Note (Signed)
Will trial Flomax, RX sent to pharmacy

## 2020-04-09 NOTE — Assessment & Plan Note (Signed)
He will continue use of CPAP Encouraged weight loss

## 2020-04-21 ENCOUNTER — Encounter: Payer: Self-pay | Admitting: Internal Medicine

## 2020-04-21 ENCOUNTER — Ambulatory Visit (INDEPENDENT_AMBULATORY_CARE_PROVIDER_SITE_OTHER): Payer: BC Managed Care – PPO | Admitting: Internal Medicine

## 2020-04-21 ENCOUNTER — Other Ambulatory Visit: Payer: Self-pay

## 2020-04-21 VITALS — BP 142/86 | HR 61 | Temp 98.1°F | Wt 356.0 lb

## 2020-04-21 DIAGNOSIS — E1165 Type 2 diabetes mellitus with hyperglycemia: Secondary | ICD-10-CM | POA: Insufficient documentation

## 2020-04-21 NOTE — Patient Instructions (Signed)
Diabetes Mellitus and Standards of Medical Care Managing diabetes (diabetes mellitus) can be complicated. Your diabetes treatment may be managed by a team of health care providers, including:  A physician who specializes in diabetes (endocrinologist).  A nurse practitioner or physician assistant.  Nurses.  A diet and nutrition specialist (registered dietitian).  A certified diabetes educator (CDE).  An exercise specialist.  A pharmacist.  An eye doctor.  A foot specialist (podiatrist).  A dentist.  A primary care provider.  A mental health provider. Your health care providers follow guidelines to help you get the best quality of care. The following schedule is a general guideline for your diabetes management plan. Your health care providers may give you more specific instructions. Physical exams Upon being diagnosed with diabetes mellitus, and each year after that, your health care provider will ask about your medical and family history. He or she will also do a physical exam. Your exam may include:  Measuring your height, weight, and body mass index (BMI).  Checking your blood pressure. This will be done at every routine medical visit. Your target blood pressure may vary depending on your medical conditions, your age, and other factors.  Thyroid gland exam.  Skin exam.  Screening for damage to your nerves (peripheral neuropathy). This may include checking the pulse in your legs and feet and checking the level of sensation in your hands and feet.  A complete foot exam to inspect the structure and skin of your feet, including checking for cuts, bruises, redness, blisters, sores, or other problems.  Screening for blood vessel (vascular) problems, which may include checking the pulse in your legs and feet and checking your temperature. Blood tests Depending on your treatment plan and your personal needs, you may have the following tests done:  HbA1c (hemoglobin A1c). This  test provides information about blood sugar (glucose) control over the previous 2-3 months. It is used to adjust your treatment plan, if needed. This test will be done: ? At least 2 times a year, if you are meeting your treatment goals. ? 4 times a year, if you are not meeting your treatment goals or if treatment goals have changed.  Lipid testing, including total, LDL, and HDL cholesterol and triglyceride levels. ? The goal for LDL is less than 100 mg/dL (5.5 mmol/L). If you are at high risk for complications, the goal is less than 70 mg/dL (3.9 mmol/L). ? The goal for HDL is 40 mg/dL (2.2 mmol/L) or higher for men and 50 mg/dL (2.8 mmol/L) or higher for women. An HDL cholesterol of 60 mg/dL (3.3 mmol/L) or higher gives some protection against heart disease. ? The goal for triglycerides is less than 150 mg/dL (8.3 mmol/L).  Liver function tests.  Kidney function tests.  Thyroid function tests. Dental and eye exams  Visit your dentist two times a year.  If you have type 1 diabetes, your health care provider may recommend an eye exam 3-5 years after you are diagnosed, and then once a year after your first exam. ? For children with type 1 diabetes, a health care provider may recommend an eye exam when your child is age 10 or older and has had diabetes for 3-5 years. After the first exam, your child should get an eye exam once a year.  If you have type 2 diabetes, your health care provider may recommend an eye exam as soon as you are diagnosed, and then once a year after your first exam. Immunizations   The   yearly flu (influenza) vaccine is recommended for everyone 6 months or older who has diabetes.  The pneumonia (pneumococcal) vaccine is recommended for everyone 2 years or older who has diabetes. If you are 65 or older, you may get the pneumonia vaccine as a series of two separate shots.  The hepatitis B vaccine is recommended for adults shortly after being diagnosed with  diabetes.  Adults and children with diabetes should receive all other vaccines according to age-specific recommendations from the Centers for Disease Control and Prevention (CDC). Mental and emotional health Screening for symptoms of eating disorders, anxiety, and depression is recommended at the time of diagnosis and afterward as needed. If your screening shows that you have symptoms (positive screening result), you may need more evaluation and you may work with a mental health care provider. Treatment plan Your treatment plan will be reviewed at every medical visit. You and your health care provider will discuss:  How you are taking your medicines, including insulin.  Any side effects you are experiencing.  Your blood glucose target goals.  The frequency of your blood glucose monitoring.  Lifestyle habits, such as activity level as well as tobacco, alcohol, and substance use. Diabetes self-management education Your health care provider will assess how well you are monitoring your blood glucose levels and whether you are taking your insulin correctly. He or she may refer you to:  A certified diabetes educator to manage your diabetes throughout your life, starting at diagnosis.  A registered dietitian who can create or review your personal nutrition plan.  An exercise specialist who can discuss your activity level and exercise plan. Summary  Managing diabetes (diabetes mellitus) can be complicated. Your diabetes treatment may be managed by a team of health care providers.  Your health care providers follow guidelines in order to help you get the best quality of care.  Standards of care including having regular physical exams, blood tests, blood pressure monitoring, immunizations, screening tests, and education about how to manage your diabetes.  Your health care providers may also give you more specific instructions based on your individual health. This information is not intended  to replace advice given to you by your health care provider. Make sure you discuss any questions you have with your health care provider. Document Revised: 03/29/2018 Document Reviewed: 04/07/2016 Elsevier Patient Education  2020 Elsevier Inc.  

## 2020-04-21 NOTE — Assessment & Plan Note (Signed)
Discussed diabetes and standards of medical care Encouraged low carb diet, exercise for weight loss Will not start diabetes medication at this time, but try 3 months of lifestyle changes Referral to diabetes education and nutrition Discussed importance of eye exams Discussed routine foot exams Discussed role of immunizations  RTC in 3 months for follow up of DM 2

## 2020-04-21 NOTE — Progress Notes (Signed)
Subjective:    Patient ID: Steve Burnett, male    DOB: 1961-03-06, 59 y.o.   MRN: [id number]  HPI  Pt presents to the clinic today for follow up of A1C. His recent A1C was 6.8%. He has never been diagnosed with diabetes in the past.  Review of Systems      Past Medical History:  Diagnosis Date  . Anxiety   . Chicken pox   . Chronic combined systolic (congestive) and diastolic (congestive) heart failure (HCC)    a. 01/2018 Echo: EF 35-40%; b. 04/2018 Echo: EF 45-50%, diff HK, Gr2 DD.  Marland Kitchen Coronary artery disease    a. 02/2012 NSTEMI/Cath/PCI: pLAD 60%, mLAD 80%, dLAD 70, nonobs LCX/RCA --> DES to prox/mid LAD; b. inf STEMI 01/23/18: ost/proxLAD 75% at the prox edge of the previously placed stent, mLAD 75%, OM1 50%, OM2 50%, pRCA 100% s/p PCI/DES, rPLA 70%; c. 02/2018 MV: Ef 44%, apical inferior infarct. No ischemia.  . Hyperlipidemia   . Hypertension   . Ischemic cardiomyopathy    a. echo 7/19: EF 35-40%, HK of the inf wall, Gr1DD; b. 04/2018 Echo: EF 45-50%, diff HK, Gr2 DD, mildly dil Ao root/Asc Al. Mild MR. Mild BAE. Nl RV fxn.  . Obesity   . OSA (obstructive sleep apnea) 03/27/2014  . PAF (paroxysmal atrial fibrillation) (HCC)    a. noted during [facility] 01/23/18 during inferior STEMI; b. resolved with IV amiodarone; c. not placed on anticoagulation given brief episode, acute illness, and need for DAPT  . PVC's (premature ventricular contractions)    a. 02/2018 Holter: Frq PVC's. No afib. Beta blocker titrated.  . VF (ventricular fibrillation) (HCC)    a. noted during [facility] 01/23/2018 s/p defibrillation x 2    Current Outpatient Medications  Medication Sig Dispense Refill  . ALPRAZolam (XANAX) 0.5 MG tablet Take 1 tablet (0.5 mg total) by mouth daily as needed for anxiety. 20 tablet 0  . aspirin 81 MG chewable tablet Chew 1 tablet (81 mg total) by mouth daily. 30 tablet 11  . carvedilol (COREG) 25 MG tablet Take 1 tablet (25 mg total) by mouth 2 (two) times daily with a meal. 180 tablet 2    . citalopram (CELEXA) 10 MG tablet Take 1 tablet (10 mg total) by mouth daily. 30 tablet 2  . clopidogrel (PLAVIX) 75 MG tablet Take 75 mg by mouth daily.    Marland Kitchen ezetimibe (ZETIA) 10 MG tablet Take 1 tablet (10 mg total) by mouth daily. 90 tablet 3  . lisinopril (ZESTRIL) 40 MG tablet Take 40 mg by mouth daily.     . mometasone (ELOCON) 0.1 % ointment Apply topically daily. 45 g 0  . rosuvastatin (CRESTOR) 10 MG tablet TAKE 1 TABLET BY MOUTH EVERY DAY AT 6PM 90 tablet 2  . spironolactone (ALDACTONE) 25 MG tablet Take 1 tablet (25 mg total) by mouth daily. 90 tablet 1  . tamsulosin (FLOMAX) 0.4 MG CAPS capsule Take 1 capsule (0.4 mg total) by mouth daily. 30 capsule 3   No current facility-administered medications for this visit.    Allergies  Allergen Reactions  . Atorvastatin Other (See Comments)    Muscle cramps   . Crestor [Rosuvastatin Calcium] Other (See Comments)    Cramps    Family History  Problem Relation Age of Onset  . Heart disease Mother   . Hyperlipidemia Mother   . Hypertension Mother   . Heart attack Mother 42       CABG X 4; past at  age 12  . Heart disease Sister 57       s/p stents   . Stroke Father   . Cancer Neg Hx   . Diabetes Neg Hx     Social History   Socioeconomic History  . Marital status: Married    Spouse name: Not on file  . Number of children: Not on file  . Years of education: Not on file  . Highest education level: Not on file  Occupational History  . Occupation: Tourist information centre manager: [employer]  Tobacco Use  . Smoking status: Never Smoker  . Smokeless tobacco: Never Used  . Tobacco comment: Never used tobacco products  Vaping Use  . Vaping Use: Never used  Substance and Sexual Activity  . Alcohol use: Yes    Alcohol/week: 2.0 standard drinks    Types: 2 Cans of beer per week    Comment: biweekly 1-2 beers  . Drug use: No  . Sexual activity: Not Currently  Other Topics Concern  . Not on file  Social History  Narrative   Lives locally with his son, who has a h/o liver failure s/p prior liver transplant.  He is pending repeat transplant.   Social Determinants of Health   Financial Resource Strain:   . Difficulty of Paying Living Expenses: Not on file  Food Insecurity:   . Worried About Programme researcher, broadcasting/film/video in the Last Year: Not on file  . Ran Out of Food in the Last Year: Not on file  Transportation Needs:   . Lack of Transportation (Medical): Not on file  . Lack of Transportation (Non-Medical): Not on file  Physical Activity:   . Days of Exercise per Week: Not on file  . Minutes of Exercise per Session: Not on file  Stress:   . Feeling of Stress : Not on file  Social Connections:   . Frequency of Communication with Friends and Family: Not on file  . Frequency of Social Gatherings with Friends and Family: Not on file  . Attends Religious Services: Not on file  . Active Member of Clubs or Organizations: Not on file  . Attends Banker Meetings: Not on file  . Marital Status: Not on file  Intimate Partner Violence:   . Fear of Current or Ex-Partner: Not on file  . Emotionally Abused: Not on file  . Physically Abused: Not on file  . Sexually Abused: Not on file     Constitutional: Denies fever, malaise, fatigue, headache or abrupt weight changes.  HEENT: Denies eye pain, eye redness, ear pain, ringing in the ears, wax buildup, runny nose, nasal congestion, bloody nose, or sore throat. Respiratory: Denies difficulty breathing, shortness of breath, cough or sputum production.   Cardiovascular: Denies chest pain, chest tightness, palpitations or swelling in the hands or feet.  GU: Pt reports urinary frequency. Denies urgency, pain with urination, burning sensation, blood in urine, odor or discharge. Skin: Denies redness, rashes, lesions or ulcercations.  Neurological: Denies numbness, tingling or problems with balance and coordination.    No other specific complaints in a  complete review of systems (except as listed in HPI above).  Objective:   Physical Exam  BP (!) 142/86   Pulse 61   Temp 98.1 F (36.7 C) (Temporal)   Wt (!) 356 lb (161.5 kg)   SpO2 98%   BMI 51.08 kg/m   Wt Readings from Last 3 Encounters:  04/08/20 (!) 356 lb (161.5 kg)  03/17/20 (!) 356  lb 3.2 oz (161.6 kg)  01/29/20 (!) 352 lb (159.7 kg)    General: Appears hius stated age, obese, in NAD. Skin: Warm, dry and intact. No  ulcerations noted. HEENT: Head: normal shape and size; Eyes: sclera white, no icterus, conjunctiva pink, PERRLA and EOMs intact; Cardiovascular: Normal rate and rhythm. S1,S2 noted.  No murmur, rubs or gallops noted.  Pulmonary/Chest: Normal effort and positive vesicular breath sounds. No respiratory distress. No wheezes, rales or ronchi noted.  Neurological: Alert and oriented.   BMET    Component Value Date/Time   NA 136 04/08/2020 1451   NA 139 01/29/2020 1221   NA 138 02/02/2014 0718   K 4.2 04/08/2020 1451   K 3.9 02/02/2014 0718   CL 104 04/08/2020 1451   CL 105 02/02/2014 0718   CO2 26 04/08/2020 1451   CO2 25 02/02/2014 0718   GLUCOSE 87 04/08/2020 1451   GLUCOSE 114 (H) 02/02/2014 0718   BUN 13 04/08/2020 1451   BUN 15 01/29/2020 1221   BUN 16 02/02/2014 0718   CREATININE 0.80 04/08/2020 1451   CREATININE 0.83 02/02/2014 0718   CALCIUM 8.8 04/08/2020 1451   CALCIUM 8.5 02/02/2014 0718   GFRNONAA 96 01/29/2020 1221   GFRNONAA >60 02/02/2014 0718   GFRAA 111 01/29/2020 1221   GFRAA >60 02/02/2014 0718    Lipid Panel     Component Value Date/Time   CHOL 141 04/08/2020 1451   CHOL 146 01/29/2020 1221   CHOL 167 03/11/2012 0645   TRIG 152.0 (H) 04/08/2020 1451   TRIG 98 03/11/2012 0645   HDL 34.60 (L) 04/08/2020 1451   HDL 35 (L) 01/29/2020 1221   HDL 41 03/11/2012 0645   CHOLHDL 4 04/08/2020 1451   VLDL 30.4 04/08/2020 1451   VLDL 20 03/11/2012 0645   LDLCALC 76 04/08/2020 1451   LDLCALC 85 01/29/2020 1221   LDLCALC 106  (H) 03/11/2012 0645    CBC    Component Value Date/Time   WBC 8.7 04/08/2020 1451   RBC 5.32 04/08/2020 1451   HGB 13.7 04/08/2020 1451   HGB 13.4 02/02/2014 0718   HCT 42.3 04/08/2020 1451   HCT 41.1 02/02/2014 0718   PLT 262.0 04/08/2020 1451   PLT 233 02/02/2014 0718   MCV 79.5 04/08/2020 1451   MCV 80 02/02/2014 0718   MCH 25.9 (L) 10/09/2019 1701   MCHC 32.3 04/08/2020 1451   RDW 15.6 (H) 04/08/2020 1451   RDW 15.7 (H) 02/02/2014 0718   LYMPHSABS 1.9 10/09/2019 1701   LYMPHSABS 0.9 (L) 03/10/2012 1240   MONOABS 0.5 10/09/2019 1701   MONOABS 0.4 03/10/2012 1240   EOSABS 0.2 10/09/2019 1701   EOSABS 0.0 03/10/2012 1240   BASOSABS 0.1 10/09/2019 1701   BASOSABS 0.0 03/10/2012 1240    Hgb A1C Lab Results  Component Value Date   HGBA1C 6.8 (H) 04/08/2020           Assessment & Plan:   [name], NP This visit occurred during the SARS-CoV-2 public health emergency.  Safety protocols were in place, including screening questions prior to the visit, additional usage of staff PPE, and extensive cleaning of exam room while observing appropriate contact time as indicated for disinfecting solutions.

## 2020-04-30 ENCOUNTER — Other Ambulatory Visit: Payer: Self-pay | Admitting: Internal Medicine

## 2020-04-30 DIAGNOSIS — F419 Anxiety disorder, unspecified: Secondary | ICD-10-CM

## 2020-05-06 ENCOUNTER — Ambulatory Visit: Payer: BC Managed Care – PPO

## 2020-05-13 ENCOUNTER — Ambulatory Visit: Payer: BC Managed Care – PPO

## 2020-05-20 ENCOUNTER — Ambulatory Visit: Payer: BC Managed Care – PPO

## 2020-06-04 ENCOUNTER — Other Ambulatory Visit: Payer: Self-pay | Admitting: Internal Medicine

## 2020-06-04 DIAGNOSIS — F419 Anxiety disorder, unspecified: Secondary | ICD-10-CM

## 2020-06-04 MED ORDER — ALPRAZOLAM 0.5 MG PO TABS: 0.5000 mg | ORAL_TABLET | Freq: Every day | ORAL | 0 refills | Status: DC | PRN

## 2020-06-04 NOTE — Telephone Encounter (Signed)
Pharmacy requests refill on: Alprazolam 0.5 mg  LAST REFILL: 04/08/2020 LAST OV: 04/21/2020 NEXT OV: 07/21/2020 PHARMACY: [id number] [place], [place]

## 2020-06-23 ENCOUNTER — Encounter: Payer: Self-pay | Admitting: Nurse Practitioner

## 2020-06-23 ENCOUNTER — Ambulatory Visit: Payer: BC Managed Care – PPO | Admitting: Nurse Practitioner

## 2020-06-23 NOTE — Progress Notes (Deleted)
Office Visit    Patient Name: Steve Burnett Date of Encounter: 06/23/2020  Primary Care Provider:  Lorre Munroe, NP Primary Cardiologist:  Lorine Bears, MD  Chief Complaint    59 year old male with a history of CAD status post non-STEMI in 2013, and inferior STEMI in 2019, hypertension, hyperlipidemia, ischemic cardiomyopathy, HFrEF, NSVT/PVCs, obesity, and sleep apnea, who presents for follow-up related to NSVT and hypertension.   Past Medical History    Past Medical History:  Diagnosis Date  . Anxiety   . Chicken pox   . Chronic combined systolic (congestive) and diastolic (congestive) heart failure (HCC)    a. 01/2018 Echo: EF 35-40%; b. 04/2018 Echo: EF 45-50%, diff HK, Gr2 DD; c. 02/2020 Echo: EF 45-50%, glob HK, Gr1 DD.  Marland Kitchen Coronary artery disease    a. 02/2012 NSTEMI/Cath/PCI: pLAD 60%, mLAD 80%, dLAD 70, nonobs LCX/RCA --> DES to prox/mid LAD; b. inf STEMI 01/23/18: ost/proxLAD 75% at the prox edge of the previously placed stent, mLAD 75%, OM1 50%, OM2 50%, pRCA 100% s/p PCI/DES, rPLA 70%; c. 02/2018 MV: Ef 44%, apical inferior infarct. No ischemia.  . Dilated aortic root (HCC)    a. 02/2020 Echo: Ao root 3.9cm.  . Hyperlipidemia   . Hypertension   . Ischemic cardiomyopathy    a. echo 7/19: EF 35-40%, HK of the inf wall, Gr1DD; b. 04/2018 Echo: EF 45-50%, diff HK, Gr2 DD, mildly dil Ao root/Asc Al. Mild MR. Mild BAE. Nl RV fxn; c. 02/2020 Echo: EF 45-50%, glob HK, Gr1 DD. RVSP ~ 35.53mmHg. Ao root 3.9cm.  . Obesity   . OSA (obstructive sleep apnea) 03/27/2014  . PAF (paroxysmal atrial fibrillation) (HCC)    a. noted during [facility] 01/23/18 during inferior STEMI; b. resolved with IV amiodarone; c. not placed on anticoagulation given brief episode, acute illness, and need for DAPT  . PVC's (premature ventricular contractions)    a. 02/2018 Holter: Frq PVC's. No afib. Beta blocker titrated.  . VF (ventricular fibrillation) (HCC)    a. noted during [facility] 01/23/2018 s/p defibrillation x 2    Past Surgical History:  Procedure Laterality Date  . CARDIAC CATHETERIZATION  03/12/2012  . CORONARY ANGIOPLASTY  03/12/2012   s/p drug eluting stent to the mid LAD : 3.5 x 38 mm Xience drug-eluting stent. Post dilated to 4.0 in mid segment and 4.5 proximally.  Rosalie Doctor ACUTE MI REVASCULARIZATION N/A 01/23/2018   Procedure: Coronary/Graft Acute MI Revascularization;  Surgeon: Marcina Millard, MD;  Location: [facility] INVASIVE CV LAB;  Service: Cardiovascular;  Laterality: N/A;  . LEFT HEART CATH AND CORONARY ANGIOGRAPHY N/A 01/23/2018   Procedure: LEFT HEART CATH AND CORONARY ANGIOGRAPHY;  Surgeon: Marcina Millard, MD;  Location: [facility] INVASIVE CV LAB;  Service: Cardiovascular;  Laterality: N/A;    Allergies  Allergies  Allergen Reactions  . Atorvastatin Other (See Comments)    Muscle cramps   . Crestor [Rosuvastatin Calcium] Other (See Comments)    Cramps    History of Present Illness   59 year old male with the above past medical history including CAD, hypertension, hyperlipidemia, ischemic cardiomyopathy, HFrEF, NSVT/PVCs, obesity, and sleep apnea. In August 2013, he suffered a non-STEMI in the setting of severe mid LAD disease requiring drug-eluting stent placement. In July 2019, he suffered an inferior STEMI and was found to have an occluded right coronary artery and underwent drug-eluting stent placement. This was complicated by VF arrest x2 and a brief episode of paroxysmal atrial fibrillation requiring amiodarone. Cath also showed moderate in-stent  restenosis in the LAD, managed medically. Echocardiogram at the time showed and EF of 35-40% with inferior hypokinesis, grade I diastolic dysfunction. Steffanie Dunn was performed as an outpatient to evaluate the ischemic burden of his LAD in August 2019, and showed prior inferior infarct without ischemia. In the setting of palpitations, he underwent event monitoring at the time which did show frequent PVCs resulting in  titration of beta-blocker therapy. Follow-up echocardiogram in October 2019 showed improvement in LV function with an EF of 45-50%, and diffuse hypokinesis.   He was seen in clinic in July 2021 for prolonged episodes palpitations. Adjustments were made to his medications in the setting of hypertension and a Zio monitor was placed which showed sinus rhythm with an average heart rate of 75 bpm, with symptomatic runs of nonsustained VT, the longest lasting 16 seconds at a rate of 142 bpm, SVT, PACs and PVCs. He remained on max dose beta-blocker therapy. He was last seen in the office on 03/17/2020 for follow-up at which time he reported ongoing intermittent palpations and stable chronic dyspnea on exertion, with an overall improvement in his symptoms. He was hypertensive at the time, and his aldactone was increased to 25 mg daily. Follow-up echo on 03/23/2020 showed an EF of 45-50%, LV global hypokineses, grade 1 diastolic dysfunction, RSVP of 09.3, mild to moderate mitral valve regurgitation, and mild dilation of the aortic root.   Since his last visit he has   ? entresto/jardiance Home Medications    Prior to Admission medications   Medication Sig Start Date End Date Taking? Authorizing Provider  ALPRAZolam Prudy Feeler) 0.5 MG tablet Take 1 tablet (0.5 mg total) by mouth daily as needed for anxiety. 06/04/20   Lorre Munroe, NP  aspirin 81 MG chewable tablet Chew 1 tablet (81 mg total) by mouth daily. 01/26/18   Dunn, Raymon Mutton, PA-C  carvedilol (COREG) 25 MG tablet Take 1 tablet (25 mg total) by mouth 2 (two) times daily with a meal. 01/06/20   Iran Ouch, MD  citalopram (CELEXA) 10 MG tablet TAKE 1 TABLET BY MOUTH EVERY DAY 04/30/20   Lorre Munroe, NP  clopidogrel (PLAVIX) 75 MG tablet Take 75 mg by mouth daily. 03/16/20   [provider]  ezetimibe (ZETIA) 10 MG tablet Take 1 tablet (10 mg total) by mouth daily. 01/06/20 04/05/20  Iran Ouch, MD  lisinopril (ZESTRIL) 40 MG tablet Take  40 mg by mouth daily.     [provider]  mometasone (ELOCON) 0.1 % ointment Apply topically daily. 04/08/20   Lorre Munroe, NP  rosuvastatin (CRESTOR) 10 MG tablet TAKE 1 TABLET BY MOUTH EVERY DAY AT [facility] 01/06/20   Iran Ouch, MD  spironolactone (ALDACTONE) 25 MG tablet Take 1 tablet (25 mg total) by mouth daily. 03/17/20   Creig Hines, NP  tamsulosin (FLOMAX) 0.4 MG CAPS capsule Take 1 capsule (0.4 mg total) by mouth daily. 04/08/20   Lorre Munroe, NP    Review of Systems    ***.  All other systems reviewed and are otherwise negative except as noted above.  Physical Exam    VS:  There were no vitals taken for this visit. , BMI There is no height or weight on file to calculate BMI. GEN: Well nourished, well developed, in no acute distress. HEENT: normal. Neck: Supple, no JVD, carotid bruits, or masses. Cardiac: RRR, no murmurs, rubs, or gallops. No clubbing, cyanosis, edema.  Radials/DP/PT 2+ and equal bilaterally.  Respiratory:  Respirations regular and unlabored, clear to auscultation bilaterally. GI: Soft, nontender, nondistended, BS + x 4. MS: no deformity or atrophy. Skin: warm and dry, no rash. Neuro:  Strength and sensation are intact. Psych: Normal affect.  Accessory Clinical Findings    ECG personally reviewed by me today - *** - no acute changes.  Lab Results  Component Value Date   WBC 8.7 04/08/2020   HGB 13.7 04/08/2020   HCT 42.3 04/08/2020   MCV 79.5 04/08/2020   PLT 262.0 04/08/2020   Lab Results  Component Value Date   CREATININE 0.80 04/08/2020   BUN 13 04/08/2020   NA 136 04/08/2020   K 4.2 04/08/2020   CL 104 04/08/2020   CO2 26 04/08/2020   Lab Results  Component Value Date   ALT 22 04/08/2020   AST 20 04/08/2020   ALKPHOS 69 04/08/2020   BILITOT 0.6 04/08/2020   Lab Results  Component Value Date   CHOL 141 04/08/2020   HDL 34.60 (L) 04/08/2020   LDLCALC 76 04/08/2020   LDLDIRECT 79.0 08/08/2016   TRIG  152.0 (H) 04/08/2020   CHOLHDL 4 04/08/2020    Lab Results  Component Value Date   HGBA1C 6.8 (H) 04/08/2020    Assessment & Plan    1.  ***   Nicolasa Ducking, NP 06/23/2020, 7:51 AM

## 2020-06-24 ENCOUNTER — Encounter: Payer: Self-pay | Admitting: Nurse Practitioner

## 2020-07-14 ENCOUNTER — Other Ambulatory Visit: Payer: Self-pay | Admitting: Internal Medicine

## 2020-07-14 DIAGNOSIS — F419 Anxiety disorder, unspecified: Secondary | ICD-10-CM

## 2020-07-14 MED ORDER — ALPRAZOLAM 0.5 MG PO TABS: 0.5000 mg | ORAL_TABLET | Freq: Every day | ORAL | 0 refills | Status: DC | PRN

## 2020-07-14 NOTE — Telephone Encounter (Signed)
Refill request Alprazolam Last office visit 04/21/20 Last refill 06/04/20 #20

## 2020-07-21 ENCOUNTER — Other Ambulatory Visit: Payer: Self-pay

## 2020-07-21 ENCOUNTER — Ambulatory Visit (INDEPENDENT_AMBULATORY_CARE_PROVIDER_SITE_OTHER): Payer: BC Managed Care – PPO | Admitting: Internal Medicine

## 2020-07-21 ENCOUNTER — Encounter: Payer: Self-pay | Admitting: Internal Medicine

## 2020-07-21 VITALS — BP 142/92 | HR 75 | Temp 98.2°F | Wt 357.0 lb

## 2020-07-21 DIAGNOSIS — I2119 ST elevation (STEMI) myocardial infarction involving other coronary artery of inferior wall: Secondary | ICD-10-CM

## 2020-07-21 DIAGNOSIS — E1165 Type 2 diabetes mellitus with hyperglycemia: Secondary | ICD-10-CM | POA: Diagnosis not present

## 2020-07-21 DIAGNOSIS — I2510 Atherosclerotic heart disease of native coronary artery without angina pectoris: Secondary | ICD-10-CM | POA: Diagnosis not present

## 2020-07-21 DIAGNOSIS — I10 Essential (primary) hypertension: Secondary | ICD-10-CM

## 2020-07-21 DIAGNOSIS — E782 Mixed hyperlipidemia: Secondary | ICD-10-CM

## 2020-07-21 DIAGNOSIS — I255 Ischemic cardiomyopathy: Secondary | ICD-10-CM

## 2020-07-21 LAB — POCT GLYCOSYLATED HEMOGLOBIN (HGB A1C): Hemoglobin A1C: 6.2 % — AB (ref 4.0–5.6)

## 2020-07-21 NOTE — Assessment & Plan Note (Signed)
BP elevated on Lisinopril, Spironolactone and Carvedilol °Would like better control- he would like to monitor for now °Reinforced DASH diet, exercise for weight loss. °

## 2020-07-21 NOTE — Assessment & Plan Note (Signed)
No angina °Continue Lisinopril, Spironolactone, Carvedilol, ASA, Plavix, Rosuvastatin,Ezetimibe °

## 2020-07-21 NOTE — Progress Notes (Signed)
Subjective:    Patient ID: Quantel Mcinturff, male    DOB: 07-05-1961, 59 y.o.   MRN: [id number]  HPI  Patient presents the clinic today for 12-month follow-up.  DM2: Recent diagnosis 03/2020.  A1c of 6.8%.  He opted to try 3 months of lifestyle modifications with diet, exercise for weight loss.  He is not taking any oral diabetic medication at this time.  He is not currently checking his sugars.  He was referred to diabetes education and nutrition but cancelled this appt.  He does not check his feet routinely.  Flu 05/2019.  Pneumovax never.  Covid Moderna  HLD with CAD/Ischemic Cardiomyopathy status post MI: His last LDL was 76, triglycerides 196, 03/2020.  He is taking R,osuvastatin, Ezetimibe, Lisinopril, Carvedilol, Spironolactone, Aspirin and Plavix as prescribed.  He follows with cardiology.  HTN: His BP today is 142/92.  He is taking Lisinopril, Carvedilol and Spironolactone as prescribed.  ECG from 02/2020 reviewed.  Review of Systems      Past Medical History:  Diagnosis Date  . Anxiety   . Chicken pox   . Chronic combined systolic (congestive) and diastolic (congestive) heart failure (HCC)    a. 01/2018 Echo: EF 35-40%; b. 04/2018 Echo: EF 45-50%, diff HK, Gr2 DD; c. 02/2020 Echo: EF 45-50%, glob HK, Gr1 DD.  Marland Kitchen Coronary artery disease    a. 02/2012 NSTEMI/Cath/PCI: pLAD 60%, mLAD 80%, dLAD 70, nonobs LCX/RCA --> DES to prox/mid LAD; b. inf STEMI 01/23/18: ost/proxLAD 75% at the prox edge of the previously placed stent, mLAD 75%, OM1 50%, OM2 50%, pRCA 100% s/p PCI/DES, rPLA 70%; c. 02/2018 MV: Ef 44%, apical inferior infarct. No ischemia.  . Dilated aortic root (HCC)    a. 02/2020 Echo: Ao root 3.9cm.  . Hyperlipidemia   . Hypertension   . Ischemic cardiomyopathy    a. echo 7/19: EF 35-40%, HK of the inf wall, Gr1DD; b. 04/2018 Echo: EF 45-50%, diff HK, Gr2 DD, mildly dil Ao root/Asc Al. Mild MR. Mild BAE. Nl RV fxn; c. 02/2020 Echo: EF 45-50%, glob HK, Gr1 DD. RVSP ~ 35.89mmHg. Ao root  3.9cm.  . Obesity   . OSA (obstructive sleep apnea) 03/27/2014  . PAF (paroxysmal atrial fibrillation) (HCC)    a. noted during [facility] 01/23/18 during inferior STEMI; b. resolved with IV amiodarone; c. not placed on anticoagulation given brief episode, acute illness, and need for DAPT  . PVC's (premature ventricular contractions)    a. 02/2018 Holter: Frq PVC's. No afib. Beta blocker titrated.  . VF (ventricular fibrillation) (HCC)    a. noted during [facility] 01/23/2018 s/p defibrillation x 2    Current Outpatient Medications  Medication Sig Dispense Refill  . ALPRAZolam (XANAX) 0.5 MG tablet Take 1 tablet (0.5 mg total) by mouth daily as needed for anxiety. 20 tablet 0  . aspirin 81 MG chewable tablet Chew 1 tablet (81 mg total) by mouth daily. 30 tablet 11  . carvedilol (COREG) 25 MG tablet Take 1 tablet (25 mg total) by mouth 2 (two) times daily with a meal. 180 tablet 2  . citalopram (CELEXA) 10 MG tablet TAKE 1 TABLET BY MOUTH EVERY DAY 90 tablet 1  . clopidogrel (PLAVIX) 75 MG tablet Take 75 mg by mouth daily.    Marland Kitchen ezetimibe (ZETIA) 10 MG tablet Take 1 tablet (10 mg total) by mouth daily. 90 tablet 3  . lisinopril (ZESTRIL) 40 MG tablet Take 40 mg by mouth daily.     . mometasone (ELOCON) 0.1 %  ointment Apply topically daily. 45 g 0  . rosuvastatin (CRESTOR) 10 MG tablet TAKE 1 TABLET BY MOUTH EVERY DAY AT 6PM 90 tablet 2  . spironolactone (ALDACTONE) 25 MG tablet Take 1 tablet (25 mg total) by mouth daily. 90 tablet 1  . tamsulosin (FLOMAX) 0.4 MG CAPS capsule Take 1 capsule (0.4 mg total) by mouth daily. 30 capsule 3   No current facility-administered medications for this visit.    Allergies  Allergen Reactions  . Atorvastatin Other (See Comments)    Muscle cramps   . Crestor [Rosuvastatin Calcium] Other (See Comments)    Cramps    Family History  Problem Relation Age of Onset  . Heart disease Mother   . Hyperlipidemia Mother   . Hypertension Mother   . Heart attack Mother 53        CABG X 4; past at age 47  . Heart disease Sister 25       s/p stents   . Stroke Father   . Cancer Neg Hx   . Diabetes Neg Hx     Social History   Socioeconomic History  . Marital status: Married    Spouse name: Not on file  . Number of children: Not on file  . Years of education: Not on file  . Highest education level: Not on file  Occupational History  . Occupation: Tourist information centre manager: [employer]  Tobacco Use  . Smoking status: Never Smoker  . Smokeless tobacco: Never Used  . Tobacco comment: Never used tobacco products  Vaping Use  . Vaping Use: Never used  Substance and Sexual Activity  . Alcohol use: Yes    Alcohol/week: 2.0 standard drinks    Types: 2 Cans of beer per week    Comment: biweekly 1-2 beers  . Drug use: No  . Sexual activity: Not Currently  Other Topics Concern  . Not on file  Social History Narrative   Lives locally with his son, who has a h/o liver failure s/p prior liver transplant.  He is pending repeat transplant.   Social Determinants of Health   Financial Resource Strain: Not on file  Food Insecurity: Not on file  Transportation Needs: Not on file  Physical Activity: Not on file  Stress: Not on file  Social Connections: Not on file  Intimate Partner Violence: Not on file     Constitutional: Denies fever, malaise, fatigue, headache or abrupt weight changes.  Respiratory: Denies difficulty breathing, shortness of breath, cough or sputum production.   Cardiovascular: Denies chest pain, chest tightness, palpitations or swelling in the hands or feet.  Skin: Denies redness, rashes, lesions or ulcercations.  Neurological: Denies numbness, tingling or problems with balance and coordination.    No other specific complaints in a complete review of systems (except as listed in HPI above).  Objective:   Physical Exam  BP (!) 142/92   Pulse 75   Temp 98.2 F (36.8 C) (Temporal)   Wt (!) 357 lb (161.9 kg)   SpO2 98%   BMI  51.22 kg/m   Wt Readings from Last 3 Encounters:  04/21/20 (!) 356 lb (161.5 kg)  04/08/20 (!) 356 lb (161.5 kg)  03/17/20 (!) 356 lb 3.2 oz (161.6 kg)    General: Appears his stated age, obese, in NAD. Skin: Warm, dry and intact. No ulcerations noted. Cardiovascular: Normal rate and rhythm. S1,S2 noted.  No murmur, rubs or gallops noted. No JVD or BLE edema. No carotid bruits noted. Pulmonary/Chest:  Normal effort and positive vesicular breath sounds. No respiratory distress. No wheezes, rales or ronchi noted.  Neurological: Alert and oriented.   BMET    Component Value Date/Time   NA 136 04/08/2020 1451   NA 139 01/29/2020 1221   NA 138 02/02/2014 0718   K 4.2 04/08/2020 1451   K 3.9 02/02/2014 0718   CL 104 04/08/2020 1451   CL 105 02/02/2014 0718   CO2 26 04/08/2020 1451   CO2 25 02/02/2014 0718   GLUCOSE 87 04/08/2020 1451   GLUCOSE 114 (H) 02/02/2014 0718   BUN 13 04/08/2020 1451   BUN 15 01/29/2020 1221   BUN 16 02/02/2014 0718   CREATININE 0.80 04/08/2020 1451   CREATININE 0.83 02/02/2014 0718   CALCIUM 8.8 04/08/2020 1451   CALCIUM 8.5 02/02/2014 0718   GFRNONAA 96 01/29/2020 1221   GFRNONAA >60 02/02/2014 0718   GFRAA 111 01/29/2020 1221   GFRAA >60 02/02/2014 0718    Lipid Panel     Component Value Date/Time   CHOL 141 04/08/2020 1451   CHOL 146 01/29/2020 1221   CHOL 167 03/11/2012 0645   TRIG 152.0 (H) 04/08/2020 1451   TRIG 98 03/11/2012 0645   HDL 34.60 (L) 04/08/2020 1451   HDL 35 (L) 01/29/2020 1221   HDL 41 03/11/2012 0645   CHOLHDL 4 04/08/2020 1451   VLDL 30.4 04/08/2020 1451   VLDL 20 03/11/2012 0645   LDLCALC 76 04/08/2020 1451   LDLCALC 85 01/29/2020 1221   LDLCALC 106 (H) 03/11/2012 0645    CBC    Component Value Date/Time   WBC 8.7 04/08/2020 1451   RBC 5.32 04/08/2020 1451   HGB 13.7 04/08/2020 1451   HGB 13.4 02/02/2014 0718   HCT 42.3 04/08/2020 1451   HCT 41.1 02/02/2014 0718   PLT 262.0 04/08/2020 1451   PLT 233  02/02/2014 0718   MCV 79.5 04/08/2020 1451   MCV 80 02/02/2014 0718   MCH 25.9 (L) 10/09/2019 1701   MCHC 32.3 04/08/2020 1451   RDW 15.6 (H) 04/08/2020 1451   RDW 15.7 (H) 02/02/2014 0718   LYMPHSABS 1.9 10/09/2019 1701   LYMPHSABS 0.9 (L) 03/10/2012 1240   MONOABS 0.5 10/09/2019 1701   MONOABS 0.4 03/10/2012 1240   EOSABS 0.2 10/09/2019 1701   EOSABS 0.0 03/10/2012 1240   BASOSABS 0.1 10/09/2019 1701   BASOSABS 0.0 03/10/2012 1240    Hgb A1C Lab Results  Component Value Date   HGBA1C 6.8 (H) 04/08/2020            Assessment & Plan:   [name], NP This visit occurred during the SARS-CoV-2 public health emergency.  Safety protocols were in place, including screening questions prior to the visit, additional usage of staff PPE, and extensive cleaning of exam room while observing appropriate contact time as indicated for disinfecting solutions.

## 2020-07-21 NOTE — Assessment & Plan Note (Signed)
No angina Continue Lisinopril, Spironolactone, Carvedilol, ASA, Plavix, Rosuvastatin,Ezetimibe

## 2020-07-21 NOTE — Assessment & Plan Note (Signed)
Continue Rosuvastatin, ASA, and Zetia Encouraged him to consume a low fat diet

## 2020-07-21 NOTE — Patient Instructions (Signed)
Carbohydrate Counting for Diabetes Mellitus, Adult  Carbohydrate counting is a method of keeping track of how many carbohydrates you eat. Eating carbohydrates naturally increases the amount of sugar (glucose) in the blood. Counting how many carbohydrates you eat helps keep your blood glucose within normal limits, which helps you manage your diabetes (diabetes mellitus). It is important to know how many carbohydrates you can safely have in each meal. This is different for every person. A diet and nutrition specialist (registered dietitian) can help you make a meal plan and calculate how many carbohydrates you should have at each meal and snack. Carbohydrates are found in the following foods:  Grains, such as breads and cereals.  Dried beans and soy products.  Starchy vegetables, such as potatoes, peas, and corn.  Fruit and fruit juices.  Milk and yogurt.  Sweets and snack foods, such as cake, cookies, candy, chips, and soft drinks. How do I count carbohydrates? There are two ways to count carbohydrates in food. You can use either of the methods or a combination of both. Reading "Nutrition Facts" on packaged food The "Nutrition Facts" list is included on the labels of almost all packaged foods and beverages in the [place] It includes:  The serving size.  Information about nutrients in each serving, including the grams (g) of carbohydrate per serving. To use the "Nutrition Facts":  Decide how many servings you will have.  Multiply the number of servings by the number of carbohydrates per serving.  The resulting number is the total amount of carbohydrates that you will be having. Learning standard serving sizes of other foods When you eat carbohydrate foods that are not packaged or do not include "Nutrition Facts" on the label, you need to measure the servings in order to count the amount of carbohydrates:  Measure the foods that you will eat with a food scale or measuring cup, if  needed.  Decide how many standard-size servings you will eat.  Multiply the number of servings by 15. Most carbohydrate-rich foods have about 15 g of carbohydrates per serving. ? For example, if you eat 8 oz (170 g) of strawberries, you will have eaten 2 servings and 30 g of carbohydrates (2 servings x 15 g = 30 g).  For foods that have more than one food mixed, such as soups and casseroles, you must count the carbohydrates in each food that is included. The following list contains standard serving sizes of common carbohydrate-rich foods. Each of these servings has about 15 g of carbohydrates:   hamburger bun or  English muffin.   oz (15 mL) syrup.   oz (14 g) jelly.  1 slice of bread.  1 six-inch tortilla.  3 oz (85 g) cooked rice or pasta.  4 oz (113 g) cooked dried beans.  4 oz (113 g) starchy vegetable, such as peas, corn, or potatoes.  4 oz (113 g) hot cereal.  4 oz (113 g) mashed potatoes or  of a large baked potato.  4 oz (113 g) canned or frozen fruit.  4 oz (120 mL) fruit juice.  4-6 crackers.  6 chicken nuggets.  6 oz (170 g) unsweetened dry cereal.  6 oz (170 g) plain fat-free yogurt or yogurt sweetened with artificial sweeteners.  8 oz (240 mL) milk.  8 oz (170 g) fresh fruit or one small piece of fruit.  24 oz (680 g) popped popcorn. Example of carbohydrate counting Sample meal  3 oz (85 g) chicken breast.  6 oz (170 g)   brown rice.  4 oz (113 g) corn.  8 oz (240 mL) milk.  8 oz (170 g) strawberries with sugar-free whipped topping. Carbohydrate calculation 1. Identify the foods that contain carbohydrates: ? Rice. ? Corn. ? Milk. ? Strawberries. 2. Calculate how many servings you have of each food: ? 2 servings rice. ? 1 serving corn. ? 1 serving milk. ? 1 serving strawberries. 3. Multiply each number of servings by 15 g: ? 2 servings rice x 15 g = 30 g. ? 1 serving corn x 15 g = 15 g. ? 1 serving milk x 15 g = 15 g. ? 1  serving strawberries x 15 g = 15 g. 4. Add together all of the amounts to find the total grams of carbohydrates eaten: ? 30 g + 15 g + 15 g + 15 g = 75 g of carbohydrates total. Summary  Carbohydrate counting is a method of keeping track of how many carbohydrates you eat.  Eating carbohydrates naturally increases the amount of sugar (glucose) in the blood.  Counting how many carbohydrates you eat helps keep your blood glucose within normal limits, which helps you manage your diabetes.  A diet and nutrition specialist (registered dietitian) can help you make a meal plan and calculate how many carbohydrates you should have at each meal and snack. This information is not intended to replace advice given to you by your health care provider. Make sure you discuss any questions you have with your health care provider. Document Revised: 02/01/2017 Document Reviewed: 12/22/2015 Elsevier Patient Education  2020 Elsevier Inc.  

## 2020-07-21 NOTE — Assessment & Plan Note (Signed)
BP elevated on Lisinopril, Spironolactone and Carvedilol Would like better control- he would like to monitor for now Reinforced DASH diet, exercise for weight loss.

## 2020-07-21 NOTE — Assessment & Plan Note (Signed)
POCT A1C today 6.2% No need to start diabetic medication at this time Encouraged him to consume a low carb diet, exercise for weight loss No urine microalbumin secondary to ACEI therapy Encouraged routine eye and foot exams Flu and covid UTD Will consider pneumovax at annual exam  RTC in 6 months for repeat lab only A1C

## 2020-08-24 ENCOUNTER — Other Ambulatory Visit: Payer: Self-pay | Admitting: Internal Medicine

## 2020-08-24 DIAGNOSIS — F419 Anxiety disorder, unspecified: Secondary | ICD-10-CM

## 2020-08-24 MED ORDER — ALPRAZOLAM 0.5 MG PO TABS: 0.5000 mg | ORAL_TABLET | Freq: Every day | ORAL | 0 refills | Status: DC | PRN

## 2020-08-24 NOTE — Telephone Encounter (Signed)
Last filled 07/14/2020.... please advise 

## 2020-09-28 ENCOUNTER — Other Ambulatory Visit: Payer: Self-pay | Admitting: Cardiovascular Disease

## 2020-10-28 ENCOUNTER — Other Ambulatory Visit: Payer: Self-pay | Admitting: Internal Medicine

## 2020-10-28 DIAGNOSIS — F419 Anxiety disorder, unspecified: Secondary | ICD-10-CM

## 2021-01-11 ENCOUNTER — Other Ambulatory Visit: Payer: Self-pay | Admitting: Cardiovascular Disease

## 2021-01-11 NOTE — Telephone Encounter (Signed)
Scheduled

## 2021-01-11 NOTE — Telephone Encounter (Signed)
Please reschedule overdue office visit. Patient did not show for last scheduled appointment. Thank you!

## 2021-01-14 ENCOUNTER — Other Ambulatory Visit: Payer: Self-pay | Admitting: Cardiovascular Disease

## 2021-01-14 NOTE — Telephone Encounter (Signed)
Please advise if ok to refill Lisinopril 40 mg qd last filled by historical provider.  Pt hasn't been seen since 02/2020 overdue for 3 month f/u. Pt has upcoming appointment 8/31.

## 2021-02-09 ENCOUNTER — Other Ambulatory Visit: Payer: Self-pay | Admitting: Cardiovascular Disease

## 2021-02-23 ENCOUNTER — Other Ambulatory Visit: Payer: Self-pay | Admitting: Cardiovascular Disease

## 2021-02-24 ENCOUNTER — Other Ambulatory Visit: Payer: Self-pay | Admitting: Cardiovascular Disease

## 2021-03-23 ENCOUNTER — Encounter: Payer: Self-pay | Admitting: Nurse Practitioner

## 2021-03-23 ENCOUNTER — Ambulatory Visit (INDEPENDENT_AMBULATORY_CARE_PROVIDER_SITE_OTHER): Payer: 59 | Admitting: Nurse Practitioner

## 2021-03-23 ENCOUNTER — Other Ambulatory Visit: Payer: Self-pay

## 2021-03-23 VITALS — BP 126/78 | HR 70 | Ht 70.5 in | Wt 373.0 lb

## 2021-03-23 DIAGNOSIS — I2510 Atherosclerotic heart disease of native coronary artery without angina pectoris: Secondary | ICD-10-CM

## 2021-03-23 DIAGNOSIS — I5022 Chronic systolic (congestive) heart failure: Secondary | ICD-10-CM

## 2021-03-23 DIAGNOSIS — E1165 Type 2 diabetes mellitus with hyperglycemia: Secondary | ICD-10-CM

## 2021-03-23 DIAGNOSIS — I493 Ventricular premature depolarization: Secondary | ICD-10-CM

## 2021-03-23 DIAGNOSIS — I10 Essential (primary) hypertension: Secondary | ICD-10-CM

## 2021-03-23 DIAGNOSIS — I4729 Other ventricular tachycardia: Secondary | ICD-10-CM

## 2021-03-23 DIAGNOSIS — I472 Ventricular tachycardia: Secondary | ICD-10-CM

## 2021-03-23 DIAGNOSIS — G4733 Obstructive sleep apnea (adult) (pediatric): Secondary | ICD-10-CM

## 2021-03-23 DIAGNOSIS — I255 Ischemic cardiomyopathy: Secondary | ICD-10-CM

## 2021-03-23 MED ORDER — SPIRONOLACTONE 25 MG PO TABS: 25.0000 mg | ORAL_TABLET | Freq: Every day | ORAL | 3 refills | Status: DC

## 2021-03-23 MED ORDER — CLOPIDOGREL BISULFATE 75 MG PO TABS: 75.0000 mg | ORAL_TABLET | Freq: Every day | ORAL | 3 refills | Status: DC

## 2021-03-23 MED ORDER — CARVEDILOL 25 MG PO TABS: 25.0000 mg | ORAL_TABLET | Freq: Two times a day (BID) | ORAL | 9 refills | Status: DC

## 2021-03-23 MED ORDER — LISINOPRIL 40 MG PO TABS: 40.0000 mg | ORAL_TABLET | Freq: Every day | ORAL | 3 refills | Status: DC

## 2021-03-23 MED ORDER — ROSUVASTATIN CALCIUM 10 MG PO TABS: ORAL_TABLET | ORAL | 3 refills | Status: DC

## 2021-03-23 MED ORDER — EZETIMIBE 10 MG PO TABS: 10.0000 mg | ORAL_TABLET | Freq: Every day | ORAL | 3 refills | Status: DC

## 2021-03-23 NOTE — Progress Notes (Signed)
Office Visit    Patient Name: Steve Burnett Date of Encounter: 03/23/2021  Primary Care Provider:  Lorre Munroe, NP Primary Cardiologist:  Lorine Bears, MD  Chief Complaint    60 year old male with a history of CAD status post non-STEMI in 2013, and inferior STEMI in 2019, hypertension, hyperlipidemia, ischemic cardiomyopathy, palpitations, PVCs, HFrEF, obesity, and sleep apnea, who presents for follow-up of CAD and heart failure.  Past Medical History    Past Medical History:  Diagnosis Date   Anxiety    Chicken pox    Chronic combined systolic (congestive) and diastolic (congestive) heart failure (HCC)    a. 01/2018 Echo: EF 35-40%; b. 04/2018 Echo: EF 45-50%, diff HK, Gr2 DD; c. 02/2020 Echo: EF 45-50%, glob HK, Gr1 DD.   Coronary artery disease    a. 02/2012 NSTEMI/Cath/PCI: pLAD 60%, mLAD 80%, dLAD 70, nonobs LCX/RCA --> DES to prox/mid LAD; b. inf STEMI 01/23/18: ost/proxLAD 75% at the prox edge of the previously placed stent, mLAD 75%, OM1 50%, OM2 50%, pRCA 100% s/p PCI/DES, rPLA 70%; c. 02/2018 MV: Ef 44%, apical inferior infarct. No ischemia.   Dilated aortic root (HCC)    a. 02/2020 Echo: Ao root 3.9cm.   Hyperlipidemia    Hypertension    Ischemic cardiomyopathy    a. echo 7/19: EF 35-40%, HK of the inf wall, Gr1DD; b. 04/2018 Echo: EF 45-50%, diff HK, Gr2 DD, mildly dil Ao root/Asc Al. Mild MR. Mild BAE. Nl RV fxn; c. 02/2020 Echo: EF 45-50%, glob HK, Gr1 DD. RVSP ~ 35.48mmHg. Ao root 3.9cm.   Obesity    OSA (obstructive sleep apnea) 03/27/2014   PAF (paroxysmal atrial fibrillation) (HCC)    a. [facility] 01/23/18 during inferior STEMI; b. resolved with IV amiodarone; c. not placed on anticoagulation given brief episode, acute illness, and need for DAPT   PVC's (premature ventricular contractions)    a. 02/2018 Holter: Frq PVC's. No afib. Beta blocker titrated.   VF (ventricular fibrillation) (HCC)    a. noted during [facility] 01/23/2018 s/p defibrillation x 2   Past  Surgical History:  Procedure Laterality Date   CARDIAC CATHETERIZATION  03/12/2012   CORONARY ANGIOPLASTY  03/12/2012   s/p drug eluting stent to the mid LAD : 3.5 x 38 mm Xience drug-eluting stent. Post dilated to 4.0 in mid segment and 4.5 proximally.   CORONARY/GRAFT ACUTE MI REVASCULARIZATION N/A 01/23/2018   Procedure: Coronary/Graft Acute MI Revascularization;  Surgeon: Marcina Millard, MD;  Location: [facility] INVASIVE CV LAB;  Service: Cardiovascular;  Laterality: N/A;   LEFT HEART CATH AND CORONARY ANGIOGRAPHY N/A 01/23/2018   Procedure: LEFT HEART CATH AND CORONARY ANGIOGRAPHY;  Surgeon: Marcina Millard, MD;  Location: [facility] INVASIVE CV LAB;  Service: Cardiovascular;  Laterality: N/A;    Allergies  Allergies  Allergen Reactions   Flomax [Tamsulosin] Nausea Only    Upset, lightheaded   Atorvastatin Other (See Comments)    Muscle cramps    Crestor [Rosuvastatin Calcium] Other (See Comments)    Cramps    History of Present Illness    60 year old male with the above past medical history including CAD, hypertension, hyperlipidemia, ischemic cardiomyopathy, palpitations, PVCs, HFrEF, obesity, and sleep apnea.  In August 2013, he suffered a non-STEMI in the setting of severe mid LAD disease requiring drug-eluting stent placement.  In July 2019, he suffered an inferior STEMI and was found to have an occluded right coronary artery and underwent drug-eluting stent placement.  This was complicated by VF arrest  x2.  He also had a brief episode of paroxysmal atrial fibrillation during catheterization and was briefly treated with amiodarone.  Catheterization at the time of inferior STEMI also showed moderate in-stent restenosis in the LAD, which has been medically managed.  Echocardiogram during admission showed an EF of 35 to 40% with inferior hypokinesis and grade 1 diastolic dysfunction.  A Lexiscan Myoview was performed as an outpatient to evaluate ischemic burden of his LAD in August 2019,  and this showed prior inferior infarct without ischemia.  He was medically managed.  In August 2019, he complained of palpitations and underwent event monitoring which showed frequent PVCs resulting in titration of beta-blocker.  Follow-up echocardiogram in October 2019 showed improvement in LV function with an EF of 45 to 50%, and diffuse hypokinesis.  In July 2021, he reported episodic tachypalpitations, lasting up to 20 minutes.  A Zio monitor was placed and showed 4 runs of nonsustained VT, the longest of which lasted 16 seconds with an average heart rate of 142 bpm.  He also had 2 runs of SVT, isolated PACs, and PVCs.  Echocardiogram was performed in August 2021 showing an EF of 45 to 50% with global hypokinesis, grade 1 diastolic dysfunction, RVSP of approximately 35.6 mmHg, and an aortic root at 3.9 cm.  Continue medical therapy was recommended.  Over the past year, Mr. Westberg notes that he has done reasonably well from a cardiac standpoint.  He does not experience chest pain or tightness.  He does experience dyspnea on exertion, which is chronic.  He sometimes notes that while resting, he feels like he cannot get a good breath and will find himself trying to take deeper breaths in order to catch up.  He does wear CPAP at night but does not always feel well rested the next day.  Over the past year, his weight is up 20 pounds.  He feels as though he has just been eating more and has been less active.  He notes that he does not have much will to try and lose weight.  Despite his weight gain, he remains active at work and in his yard.  He denies palpitations, PND, orthopnea, dizziness, syncope, edema, or early satiety.  His wife is present with him today and is very concerned about his weight gain and questions whether or not he might benefit from a referral to weight management.  He would be interested in talking to our pharmacists about possibly getting started on semaglutide, but is not interested in a  more formal weight management referral.  Home Medications    Current Outpatient Medications  Medication Sig Dispense Refill   aspirin 81 MG chewable tablet Chew 1 tablet (81 mg total) by mouth daily. 30 tablet 11   carvedilol (COREG) 25 MG tablet Take 1 tablet (25 mg total) by mouth 2 (two) times daily with a meal. PLEASE CALL OFFICE TO SCHEDULE APPOINTMENT FOR FURTHER REFILLS 180 tablet 0   citalopram (CELEXA) 10 MG tablet TAKE 1 TABLET BY MOUTH EVERY DAY 90 tablet 1   clopidogrel (PLAVIX) 75 MG tablet Take 1 tablet (75 mg total) by mouth daily. 90 tablet 0   ezetimibe (ZETIA) 10 MG tablet Take 1 tablet (10 mg total) by mouth daily. 90 tablet 3   lisinopril (ZESTRIL) 40 MG tablet TAKE 1 TABLET (40 MG TOTAL) BY MOUTH DAILY. MUST KEEP APPOINTMENT FOR FURTHER REFILLS. THANK YOU! 90 tablet 0   rosuvastatin (CRESTOR) 10 MG tablet TAKE 1 TABLET BY MOUTH EVERY DAY  AT 6PM 90 tablet 2   spironolactone (ALDACTONE) 25 MG tablet Take 1 tablet (25 mg total) by mouth daily. 90 tablet 1   No current facility-administered medications for this visit.     Review of Systems    Chronic dyspnea on exertion with occasional episodes of air hunger while at rest.  He has sleep apnea and wears CPAP but does not always feel well rested.  He denies chest pain, palpitations, PND, orthopnea, dizziness, syncope, edema, or early satiety.  All other systems reviewed and are otherwise negative except as noted above.  Physical Exam    VS:  BP 126/78 (BP Location: Left Arm, Patient Position: Sitting, Cuff Size: Large)   Pulse 70   Ht 5' 10.5" (1.791 m)   Wt (!) 373 lb (169.2 kg)   SpO2 96%   BMI 52.76 kg/m  , BMI Body mass index is 52.76 kg/m.     GEN: Obese, in no acute distress. HEENT: normal. Neck: Supple, obese, difficult to gauge JVP.  No carotid bruits, or masses. Cardiac: RRR, distant, no murmurs, rubs, or gallops. No clubbing, cyanosis, edema.  Radials 2+/PT 1+ and equal bilaterally.  Respiratory:   Respirations regular and unlabored, clear to auscultation bilaterally. GI: Obese, soft, nontender, nondistended, BS + x 4. MS: no deformity or atrophy. Skin: warm and dry, no rash. Neuro:  Strength and sensation are intact. Psych: Normal affect.  Accessory Clinical Findings    ECG personally reviewed by me today -regular sinus rhythm, 70 - no acute changes.  Lab Results  Component Value Date   WBC 8.7 04/08/2020   HGB 13.7 04/08/2020   HCT 42.3 04/08/2020   MCV 79.5 04/08/2020   PLT 262.0 04/08/2020   Lab Results  Component Value Date   CREATININE 0.80 04/08/2020   BUN 13 04/08/2020   NA 136 04/08/2020   K 4.2 04/08/2020   CL 104 04/08/2020   CO2 26 04/08/2020   Lab Results  Component Value Date   ALT 22 04/08/2020   AST 20 04/08/2020   ALKPHOS 69 04/08/2020   BILITOT 0.6 04/08/2020   Lab Results  Component Value Date   CHOL 141 04/08/2020   HDL 34.60 (L) 04/08/2020   LDLCALC 76 04/08/2020   LDLDIRECT 79.0 08/08/2016   TRIG 152.0 (H) 04/08/2020   CHOLHDL 4 04/08/2020    Lab Results  Component Value Date   HGBA1C 6.2 (A) 07/21/2020    Assessment & Plan    1.  Coronary artery disease: Status post prior LAD and RCA PCI.  He has known residual in-stent restenosis of the LAD but did not have any anterior ischemia by stress testing in 2019.  He has not been experiencing any chest pain.  He notes that he is active in and around his home and at work but is not necessarily exercising.  Is chronic, stable dyspnea on exertion.  He remains on aspirin, beta-blocker, Plavix, statin, Zetia, and ACE inhibitor therapy.  Encourage 30 minutes of exercise daily.  2.  Essential hypertension: Blood pressure looks good today at 126/78.  He notes compliance with his beta-blocker, ACE inhibitor, and spironolactone.  He is due for lab work but prefers to defer until he reestablishes with primary care.  I advised that if he does not have primary care by next month, to let us know and we can  arrange for follow-up labs.  3.  Hyperlipidemia: LDL was 76 last September.  Statin dose limited by myalgias and Zetia was added previously.  Weight is up 20 pounds and we discussed the importance of weight loss rather than adding more medicine such as a PCSK9 inhibitor.  As noted above, he is due for follow-up labs but wishes to defer at this time.  4.  Ischemic cardiomyopathy/chronic heart failure with midrange ejection fraction: EF of 45 to 50% by echo in August 2021.  Despite his weight gain, he is euvolemic on examination heart rate and blood pressure are stable.  Continue beta-blocker, ACE inhibitor, and spironolactone.  He is deferring lab work today.  5.  Nonsustained VT/PVCs: No PVCs on ECG today.  He denies palpitations.  Continue beta-blocker therapy.  6.  Obstructive sleep apnea: Reports compliance with CPAP.  Does not always feel well rested but in general, feels as though this has been stable.  7.  History of atrial fibrillation: This occurred at the time of his myocardial infarction in 2019 and was short-lived.  He has not had any documented atrial fibrillation since then and is not on long-term anticoagulation.  He remains on beta-blocker therapy.  8.  Morbid obesity: Patient's weight is up 20 pounds since his last visit.  He is 373 pounds and notes that he is simply not motivated to lose weight.  We discussed importance of regular activity and calorie restriction and he notes that he has been on multiple diets in the past and knows what he takes to lose weight but just is not motivated.  He would be interested in talking with one of our pharmacist regarding semaglutide therapy.  I note that his A1c was 6.8 in September 2021 and subsequently down but still mildly elevated at 6.2 in December 2021.    9.  Disposition: Patient is due for follow-up complete metabolic panel, CBC, lipids, TSH, and A1c.  I offered to put orders in for this however, he prefers to have it done through primary  care.  His previous primary care provider has left and he is going to reach back out to his primary care office to establish care with a new provider there.  I advised that if this does not occur within the next 2 months to let us know we can order labs.  I will refer her to our pharmacist in [place] to assess feasibility of adding semaglutide therapy.  Otherwise, patient will follow-up here in 6 months or sooner if necessary.  Nicolasa Ducking, NP 03/23/2021, 4:11 PM

## 2021-03-23 NOTE — Patient Instructions (Signed)
Medication Instructions:  Refills have been sent in.   *If you need a refill on your cardiac medications before your next appointment, please call your pharmacy*   Lab Work: None  If you have labs (blood work) drawn today and your tests are completely normal, you will receive your results only by: MyChart Message (if you have MyChart) OR A paper copy in the mail If you have any lab test that is abnormal or we need to change your treatment, we will call you to review the results.   Testing/Procedures: None   Follow-Up: At [facility], you and your health needs are our priority.  As part of our continuing mission to provide you with exceptional heart care, we have created designated Provider Care Teams.  These Care Teams include your primary Cardiologist (physician) and Advanced Practice Providers (APPs -  Physician Assistants and Nurse Practitioners) who all work together to provide you with the care you need, when you need it.  Your next appointment:   6 month(s)  The format for your next appointment:   In Person  Provider:   Lorine Bears, MD or Nicolasa Ducking, NP   Other Instructions Will have them call with appointment for the weight loss clinic.

## 2021-03-25 NOTE — Addendum Note (Signed)
Addended by: Bryna Colander on: 03/25/2021 03:57 PM   Modules accepted: Orders

## 2021-04-01 ENCOUNTER — Ambulatory Visit: Payer: 59

## 2021-04-01 NOTE — Progress Notes (Unsigned)
Patient ID: Steve Burnett                 DOB: 07-17-1961                    MRN: [id number]     HPI: Steve Burnett is a 60 y.o. male patient referred to pharmacy clinic by Steve Hodgkins, NP, to initiate weight loss therapy with GLP1-RA (semaglutide). PMH is significant for obesity complicated by chronic medical conditions including CAD status post non-STEMI in 2013, and inferior STEMI in 2019, hypertension, hyperlipidemia, type II diabetes, ischemic cardiomyopathy, palpitations, PVCs, HFrEF, obesity, and sleep apnea. Most recent BMI ***, previous BMI 52.76 kg/m.  Patient was diagnosed with type II diabetes in 03/2020 and was being followed by his PCP, Steve Silversmith, NP. A1c of 6.8% at time of diagnosis. He opted to try 3 months of lifestyle modifications with diet, exercise for weight loss. He is not taking any diabetic medication at this time.  He is not currently checking his sugars. Latest A1c is 6.2% in 06/2020. He was encouraged to consume a low carb diet and exercise for weight loss. He prefers to have labs done through primary care. His previous primary care provider has left and he is going to reach back out to his primary care office to establish care with a new provider there. If this does not happen within the next two months, he was advised by Steve Hodgkins, NP, to let him know to order labs.  *** If diabetic and on insulin/sulfonylurea, can consider reducing dose to reduce risk of hypoglycemia - N/A, not on insulin or any other diabetic medications  *** Follow-up visit  Assess % weight loss Assess adverse effects Missed doses  Current weight management medications: none?  Previously tried meds: none?  Current meds that may affect weight: carvedilol 42m, citalopram 148mdaily (Other meds: spironolactone 25, rosuvastatin 10, lisinopril 40, ezetimibe 10, Plavix 75, bASA)  Baseline weight/BMI:  Insurance payor:   Diet:   -Breakfast: -Lunch: -Dinner: -Snacks: -Drinks:  Exercise:   Family History: The patient's family history is significant for the following: heart disease in Mother; hyperlipidemia in Mother, hypertension in Mother; heart attack s/p CABG X 4 in Mother (onset at age 262past at age 58 heart disease s/p stents in Sister (onset at age 58 stroke in Father. Patient has no family history of cancer or diabetes.  Social History: Patient is married. 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 He has 1-2 beers biweekly. Lives locally with his son, who has h/o liver failure s/p prior liver transplant - he is pending repeat transplant.  Labs: Lab Results  Component Value Date   HGBA1C 6.2 (A) 07/21/2020    Wt Readings from Last 1 Encounters:  03/23/21 (!) 373 lb (169.2 kg)    BP Readings from Last 1 Encounters:  03/23/21 126/78   Pulse Readings from Last 1 Encounters:  03/23/21 70       Component Value Date/Time   CHOL 141 04/08/2020 1451   CHOL 146 01/29/2020 1221   CHOL 167 03/11/2012 0645   TRIG 152.0 (H) 04/08/2020 1451   TRIG 98 03/11/2012 0645   HDL 34.60 (L) 04/08/2020 1451   HDL 35 (L) 01/29/2020 1221   HDL 41 03/11/2012 0645   CHOLHDL 4 04/08/2020 1451   VLDL 30.4 04/08/2020 1451   VLDL 20 03/11/2012 0645   LDLCALC 76 04/08/2020 1451   LDLCALC 85  01/29/2020 1221   LDLCALC 106 (H) 03/11/2012 0645   LDLDIRECT 79.0 08/08/2016 1540    Past Medical History:  Diagnosis Date   Anxiety    Chicken pox    Chronic combined systolic (congestive) and diastolic (congestive) heart failure ([facility])    a. 01/2018 Echo: EF 35-40%; b. 04/2018 Echo: EF 45-50%, diff HK, Gr2 DD; c. 02/2020 Echo: EF 45-50%, glob HK, Gr1 DD.   Coronary artery disease    a. 02/2012 NSTEMI/Cath/PCI: pLAD 60%, mLAD 80%, dLAD 70, nonobs LCX/RCA --> DES to prox/mid LAD; b. inf STEMI 01/23/18: ost/proxLAD 75% at the prox edge of the previously placed stent,  mLAD 75%, OM1 50%, OM2 50%, pRCA 100% s/p PCI/DES, rPLA 70%; c. 02/2018 MV: Ef 44%, apical inferior infarct. No ischemia.   Dilated aortic root ([place])    a. 02/2020 Echo: Ao root 3.9cm.   Hyperlipidemia    Hypertension    Ischemic cardiomyopathy    a. echo 7/19: EF 35-40%, HK of the inf wall, Gr1DD; b. 04/2018 Echo: EF 45-50%, diff HK, Gr2 DD, mildly dil Ao root/Asc Al. Mild MR. Mild BAE. Nl RV fxn; c. 02/2020 Echo: EF 45-50%, glob HK, Gr1 DD. RVSP ~ 35.64mHg. Ao root 3.9cm.   Obesity    OSA (obstructive sleep apnea) 03/27/2014   PAF (paroxysmal atrial fibrillation) ([place]    [facility]; b. resolved with IV amiodarone; c. not placed on anticoagulation given brief episode, acute illness, and need for DAPT   PVC's (premature ventricular contractions)    a. 02/2018 Holter: Frq PVC's. No afib. Beta blocker titrated.   VF ([place]ricular fibrillation) ([place]    [facility] s/p def[facility] x 2    Current Outpatient Medications on File Prior to Visit  Medication Sig Dispense Refill   aspirin 81 MG chewable tablet Chew 1 tablet (81 mg total) by mouth daily. 30 tablet 11   carvedilol (COREG) 25 MG tablet Take 1 tablet (25 mg total) by mouth 2 (two) times daily with a meal. 180 tablet 9   citalopram (CELEXA) 10 MG tablet TAKE 1 TABLET BY MOUTH EVERY DAY 90 tablet 1   clopidogrel (PLAVIX) 75 MG tablet Take 1 tablet (75 mg total) by mouth daily. 90 tablet 3   ezetimibe (ZETIA) 10 MG tablet Take 1 tablet (10 mg total) by mouth daily. 90 tablet 3   lisinopril (ZESTRIL) 40 MG tablet Take 1 tablet (40 mg total) by mouth daily. 90 tablet 3   rosuvastatin (CRESTOR) 10 MG tablet TAKE 1 TABLET BY MOUTH EVERY DAY AT 6PM 90 tablet 3   spironolactone (ALDACTONE) 25 MG tablet Take 1 tablet (25 mg total) by mouth daily. 90 tablet 3   No current facility-administered medications on file prior to visit.    Allergies  Allergen Reactions   Flomax [Tamsulosin] Nausea  Only    Upset, lightheaded   Atorvastatin Other (See Comments)    Muscle cramps    Crestor [Rosuvastatin Calcium] Other (See Comments)    Cramps     Assessment/Plan:  1. Weight loss - Patient has not met goal of at least 5% of body weight loss with comprehensive lifestyle modifications alone in the past 3-6 months. Pharmacotherapy is appropriate to pursue as augmentation. Will start ***(Wegovy/Ozempic?). Confirmed patient has no personal or family history of medullary thyroid carcinoma (MTC) or Multiple Endocrine Neoplasia syndrome type 2 (MEN 2).   Advised patient on common side effects including nausea, diarrhea, dyspepsia, decreased appetite,  and fatigue. Counseled patient on reducing meal size and how to titrate medication to minimize side effects. Counseled patient to call if intolerable side effects or if experiencing dehydration, abdominal pain, or dizziness. Patient will adhere to dietary modifications and will target at least 150 minutes of moderate intensity exercise weekly.   Follow up in ***.  Thank you

## 2021-04-05 ENCOUNTER — Telehealth: Payer: Self-pay | Admitting: Student-PharmD

## 2021-04-05 DIAGNOSIS — E1165 Type 2 diabetes mellitus with hyperglycemia: Secondary | ICD-10-CM

## 2021-04-05 NOTE — Telephone Encounter (Signed)
Patient referred by Ward Givens to start GLP1 agonist treatment for weight loss. Patient no showed appointment on 9/9 with pharmacy clinic. I started to submit a PA for Saxenda to see if his plan covers weight loss medications but no PA is required.   Would need to start with once daily Saxenda due to current supply shortage of Ozempic. Once titrated to 3 mg daily dose of Saxenda, will switch to [facility] 1.7 mg weekly, which is the lowest concentration available of Wegovy on the market currently, due to its own shortage.   Titration plan would be:  Saxenda 0.6 mg once daily x 1 week Saxenda 1.2 mg once daily x 1 week Saxenda 1.8 mg once daily x 1 week Saxenda 2.4 mg once daily x 1 week Saxenda 3 mg once daily x 1 week Switch to [facility] 1.7 mg weekly x 4 weeks Wegovy 2.4 mg once weekly   Called the patient to discuss interest in starting treatment. Unable to reach, left voicemail requesting call back. If so, will send in Rx for Saxenda (he can use a copay card if cost is >$25) and re-schedule him for visit with the pharmacist clinic.

## 2021-04-06 MED ORDER — SAXENDA 18 MG/3ML ~~LOC~~ SOPN: PEN_INJECTOR | SUBCUTANEOUS | 1 refills | Status: DC

## 2021-04-06 MED ORDER — VICTOZA 18 MG/3ML ~~LOC~~ SOPN
PEN_INJECTOR | SUBCUTANEOUS | 1 refills | Status: DC
Start: 2021-04-06 — End: 2021-11-10

## 2021-04-06 MED ORDER — PEN NEEDLES 32G X 4 MM MISC: 0 refills | Status: DC

## 2021-04-06 NOTE — Telephone Encounter (Signed)
Called patient regarding the below but he was not available at this time to talk. Requesting call back this afternoon after 4pm. Will try him again at that time.

## 2021-04-06 NOTE — Telephone Encounter (Signed)
Called patient back and discussed GLP1 therapy. He is interested in potentially starting treatment. Asks about the percentage weight loss seen and common adverse effects. He would like to do his own research on side effects to see if he wants to move forward with starting. In the meantime, he would like me to send it to his pharmacy and see what the cost is. Discussed that a copay card if available if needed. Asks me to call him back tomorrow once he's been able to look up the side effects and I've found the cost, and then he will decide if he wants to start treatment. If so, will schedule him for first injection training visit at that time.   Called his pharmacy, cost for Saxenda is $1000 for a 30 day supply. Sent in for Victoza instead (he does have a diagnosis of DM but not currently on any therapy, last A1c in December 2021 6.2). Cost for this is $30 (no copay card exists for Victoza).

## 2021-04-07 NOTE — Telephone Encounter (Signed)
Called patient to provide updates as below. He was busy at this time and requests call back tomorrow morning.

## 2021-04-08 NOTE — Telephone Encounter (Signed)
Called patient back this morning at the time he requested yesterday but unable to reach, left voicemail requesting call back.   Need to inform him that though his insurance "covers" Saxenda, cost is $1000/month. However, then sent in Victoza to his pharmacy and cost is $30/month (no copay card available).   During our last conversation he wanted to think about whether he wanted to start GLP1 therapy, so need to follow up on that as well. If he is interested, need to schedule him with the pharmacist clinic for first injection training.

## 2021-04-14 ENCOUNTER — Other Ambulatory Visit: Payer: Self-pay | Admitting: Internal Medicine

## 2021-04-14 DIAGNOSIS — F419 Anxiety disorder, unspecified: Secondary | ICD-10-CM

## 2021-04-14 NOTE — Telephone Encounter (Signed)
Last OV- 07/21/2020 Next OV- N/A Last Filled- 10/28/2020

## 2021-04-19 NOTE — Telephone Encounter (Signed)
Called patient for the 7th time over the past 2 weeks to try to discuss starting GLP1 treatment. Call rang twice and was sent to voicemail. Informed the patient of multiple attempts to reach him without hearing back and if he is interested in starting the medication we discussed last week to call back so we can set him up with an appointment with the pharmacy clinic. This will be our last attempt to reach him at this time.

## 2021-06-22 ENCOUNTER — Emergency Department: Payer: 59

## 2021-06-22 ENCOUNTER — Emergency Department
Admission: EM | Admit: 2021-06-22 | Discharge: 2021-06-22 | Disposition: A | Discharge status: Home / Self Care | Payer: 59 | Attending: Emergency Medicine | Admitting: Emergency Medicine

## 2021-06-22 ENCOUNTER — Other Ambulatory Visit: Payer: Self-pay

## 2021-06-22 DIAGNOSIS — Z5321 Procedure and treatment not carried out due to patient leaving prior to being seen by health care provider: Secondary | ICD-10-CM | POA: Insufficient documentation

## 2021-06-22 DIAGNOSIS — R42 Dizziness and giddiness: Secondary | ICD-10-CM | POA: Insufficient documentation

## 2021-06-22 LAB — CBC
HCT: 43.7 % (ref 39.0–52.0)
Hemoglobin: 14.5 g/dL (ref 13.0–17.0)
MCH: 26 pg (ref 26.0–34.0)
MCHC: 33.2 g/dL (ref 30.0–36.0)
MCV: 78.3 fL — ABNORMAL LOW (ref 80.0–100.0)
Platelets: 287 10*3/uL (ref 150–400)
RBC: 5.58 MIL/uL (ref 4.22–5.81)
RDW: 15.3 % (ref 11.5–15.5)
WBC: 7.5 10*3/uL (ref 4.0–10.5)
nRBC: 0 % (ref 0.0–0.2)

## 2021-06-22 LAB — URINALYSIS, ROUTINE W REFLEX MICROSCOPIC
Bilirubin Urine: NEGATIVE
Glucose, UA: NEGATIVE mg/dL
Ketones, ur: NEGATIVE mg/dL
Leukocytes,Ua: NEGATIVE
Nitrite: NEGATIVE
Protein, ur: NEGATIVE mg/dL
Specific Gravity, Urine: 1.02 (ref 1.005–1.030)
pH: 7 (ref 5.0–8.0)

## 2021-06-22 LAB — URINALYSIS, MICROSCOPIC (REFLEX)
Bacteria, UA: NONE SEEN
Squamous Epithelial / HPF: NONE SEEN (ref 0–5)

## 2021-06-22 LAB — TROPONIN I (HIGH SENSITIVITY)
Troponin I (High Sensitivity): 9 ng/L (ref <18)
Troponin I (High Sensitivity): 6 ng/L (ref <18)

## 2021-06-22 LAB — BASIC METABOLIC PANEL
Anion gap: 6 (ref 5–15)
BUN: 15 mg/dL (ref 6–20)
CO2: 25 mmol/L (ref 22–32)
Calcium: 8.7 mg/dL — ABNORMAL LOW (ref 8.9–10.3)
Chloride: 103 mmol/L (ref 98–111)
Creatinine, Ser: 0.73 mg/dL (ref 0.61–1.24)
GFR, Estimated: >60 mL/min (ref >60)
Glucose, Bld: 115 mg/dL — ABNORMAL HIGH (ref 70–99)
Potassium: 3.9 mmol/L (ref 3.5–5.1)
Sodium: 134 mmol/L — ABNORMAL LOW (ref 135–145)

## 2021-06-22 NOTE — ED Triage Notes (Addendum)
Pt here with dizziness that started at 0700 this morning. Pt states he was on the way to work and he began to forget some things and progressively got worse throughout the morning. Pt states at his worst he felt like he was going to pass out. Pt in NAD in triage.

## 2021-06-23 ENCOUNTER — Telehealth: Payer: 59 | Admitting: Physician Assistant

## 2021-06-23 DIAGNOSIS — H8110 Benign paroxysmal vertigo, unspecified ear: Secondary | ICD-10-CM

## 2021-06-23 MED ORDER — FLUTICASONE PROPIONATE 50 MCG/ACT NA SUSP: 2.0000 | Freq: Every day | NASAL | 0 refills | Status: DC

## 2021-06-23 MED ORDER — MECLIZINE HCL 25 MG PO TABS: 25.0000 mg | ORAL_TABLET | Freq: Three times a day (TID) | ORAL | 0 refills | Status: DC | PRN

## 2021-06-23 NOTE — Progress Notes (Signed)
Virtual Visit Consent   Steve Burnett, you are scheduled for a virtual visit with a [facility] provider today.     Just as with appointments in the office, your consent must be obtained to participate.  Your consent will be active for this visit and any virtual visit you may have with one of our providers in the next 365 days.     If you have a MyChart account, a copy of this consent can be sent to you electronically.  All virtual visits are billed to your insurance company just like a traditional visit in the office.    As this is a virtual visit, video technology does not allow for your provider to perform a traditional examination.  This may limit your provider's ability to fully assess your condition.  If your provider identifies any concerns that need to be evaluated in person or the need to arrange testing (such as labs, EKG, etc.), we will make arrangements to do so.     Although advances in technology are sophisticated, we cannot ensure that it will always work on either your end or our end.  If the connection with a video visit is poor, the visit may have to be switched to a telephone visit.  With either a video or telephone visit, we are not always able to ensure that we have a secure connection.     I need to obtain your verbal consent now.   Are you willing to proceed with your visit today?    Steve Burnett has provided verbal consent on 06/23/2021 for a virtual visit (video or telephone).   Margaretann Loveless, PA-C   Date: 06/23/2021 9:58 AM   Virtual Visit via Video Note   I, Margaretann Loveless, connected with  Steve Burnett  ([id number], 07-14-1961) on 06/23/21 at  9:30 AM EST by a video-enabled telemedicine application and verified that I am speaking with the correct person using two identifiers.  Location: Patient: Virtual Visit Location Patient: Home Provider: Virtual Visit Location Provider: Home Office   I discussed the limitations of evaluation and management by  telemedicine and the availability of in person appointments. The patient expressed understanding and agreed to proceed.    History of Present Illness: Steve Burnett is a 60 y.o. who identifies as a male who was assigned male at birth, and is being seen today for dizziness.  HPI: Dizziness This is a new problem. The current episode started yesterday. The problem occurs constantly. Associated symptoms include nausea and vertigo.  Started yesterday out of no where while driving. Was having some confusion with it and then got anxious and developed heart palpitations. Was seen in ER last night and had CVA and MI ruled out. Still having dizziness today. Dizziness is more off balance and feeling like he may fall while walking. Mild room spinning sensations. All movements make worse.    Problems:  Patient Active Problem List   Diagnosis Date Noted   Type 2 diabetes mellitus with hyperglycemia, without long-term current use of insulin (HCC) 04/21/2020   BPH (benign prostatic hyperplasia) 04/09/2020   Dyshidrotic hand dermatitis 04/09/2020   Ischemic cardiomyopathy 01/25/2018   STEMI involving oth coronary artery of inferior wall (HCC) 01/23/2018   OSA (obstructive sleep apnea) 03/27/2014   Anxiety 07/31/2012   Hyperlipidemia    Essential hypertension    Coronary artery disease 03/12/2012    Allergies:  Allergies  Allergen Reactions   Flomax [Tamsulosin] Nausea Only    Upset, lightheaded  Atorvastatin Other (See Comments)    Muscle cramps    Crestor [Rosuvastatin Calcium] Other (See Comments)    Cramps   Medications:  Current Outpatient Medications:    fluticasone (FLONASE) 50 MCG/ACT nasal spray, Place 2 sprays into both nostrils daily., Disp: 16 g, Rfl: 0   meclizine (ANTIVERT) 25 MG tablet, Take 1 tablet (25 mg total) by mouth 3 (three) times daily as needed for dizziness., Disp: 30 tablet, Rfl: 0   aspirin 81 MG chewable tablet, Chew 1 tablet (81 mg total) by mouth daily., Disp: 30  tablet, Rfl: 11   carvedilol (COREG) 25 MG tablet, Take 1 tablet (25 mg total) by mouth 2 (two) times daily with a meal., Disp: 180 tablet, Rfl: 9   citalopram (CELEXA) 10 MG tablet, TAKE 1 TABLET BY MOUTH EVERY DAY, Disp: 90 tablet, Rfl: 1   clopidogrel (PLAVIX) 75 MG tablet, Take 1 tablet (75 mg total) by mouth daily., Disp: 90 tablet, Rfl: 3   ezetimibe (ZETIA) 10 MG tablet, Take 1 tablet (10 mg total) by mouth daily., Disp: 90 tablet, Rfl: 3   Insulin Pen Needle (PEN NEEDLES) 32G X 4 MM MISC, Use to inject Saxenda daily., Disp: 100 each, Rfl: 0   liraglutide (VICTOZA) 18 MG/3ML SOPN, Inject 0.6 mg into the abdomen daily x 7 days, then increase to 1.2mg  x 7 days, then 1.8mg  x 7 days, Disp: 15 mL, Rfl: 1   lisinopril (ZESTRIL) 40 MG tablet, Take 1 tablet (40 mg total) by mouth daily., Disp: 90 tablet, Rfl: 3   rosuvastatin (CRESTOR) 10 MG tablet, TAKE 1 TABLET BY MOUTH EVERY DAY AT 6PM, Disp: 90 tablet, Rfl: 3   spironolactone (ALDACTONE) 25 MG tablet, Take 1 tablet (25 mg total) by mouth daily., Disp: 90 tablet, Rfl: 3  Observations/Objective: Patient is well-developed, well-nourished in no acute distress.  Resting comfortably at home.  Head is normocephalic, atraumatic.  No labored breathing.  Speech is clear and coherent with logical content.  Patient is alert and oriented at baseline.    Assessment and Plan: 1. Benign paroxysmal positional vertigo, unspecified laterality - meclizine (ANTIVERT) 25 MG tablet; Take 1 tablet (25 mg total) by mouth 3 (three) times daily as needed for dizziness.  Dispense: 30 tablet; Refill: 0 - fluticasone (FLONASE) 50 MCG/ACT nasal spray; Place 2 sprays into both nostrils daily.  Dispense: 16 g; Refill: 0  - Suspect BPPV - Labs, CT, and EKG from ER all reviewed by me today prior to visit - Meclizine and flonase for dizziness - Push fluids - Rest - Seek in person evaluation if not improving or if symptoms worsen  Follow Up Instructions: I discussed  the assessment and treatment plan with the patient. The patient was provided an opportunity to ask questions and all were answered. The patient agreed with the plan and demonstrated an understanding of the instructions.  A copy of instructions were sent to the patient via MyChart unless otherwise noted below.    The patient was advised to call back or seek an in-person evaluation if the symptoms worsen or if the condition fails to improve as anticipated.  Time:  I spent 12 minutes with the patient via telehealth technology discussing the above problems/concerns.    Margaretann Loveless, PA-C

## 2021-06-23 NOTE — Patient Instructions (Signed)
Steve Burnett, thank you for joining Margaretann Loveless, PA-C for today's virtual visit.  While this provider is not your primary care provider (PCP), if your PCP is located in our provider database this encounter information will be shared with them immediately following your visit.  Consent: (Patient) Steve Burnett provided verbal consent for this virtual visit at the beginning of the encounter.  Current Medications:  Current Outpatient Medications:    fluticasone (FLONASE) 50 MCG/ACT nasal spray, Place 2 sprays into both nostrils daily., Disp: 16 g, Rfl: 0   meclizine (ANTIVERT) 25 MG tablet, Take 1 tablet (25 mg total) by mouth 3 (three) times daily as needed for dizziness., Disp: 30 tablet, Rfl: 0   aspirin 81 MG chewable tablet, Chew 1 tablet (81 mg total) by mouth daily., Disp: 30 tablet, Rfl: 11   carvedilol (COREG) 25 MG tablet, Take 1 tablet (25 mg total) by mouth 2 (two) times daily with a meal., Disp: 180 tablet, Rfl: 9   citalopram (CELEXA) 10 MG tablet, TAKE 1 TABLET BY MOUTH EVERY DAY, Disp: 90 tablet, Rfl: 1   clopidogrel (PLAVIX) 75 MG tablet, Take 1 tablet (75 mg total) by mouth daily., Disp: 90 tablet, Rfl: 3   ezetimibe (ZETIA) 10 MG tablet, Take 1 tablet (10 mg total) by mouth daily., Disp: 90 tablet, Rfl: 3   Insulin Pen Needle (PEN NEEDLES) 32G X 4 MM MISC, Use to inject Saxenda daily., Disp: 100 each, Rfl: 0   liraglutide (VICTOZA) 18 MG/3ML SOPN, Inject 0.6 mg into the abdomen daily x 7 days, then increase to 1.2mg  x 7 days, then 1.8mg  x 7 days, Disp: 15 mL, Rfl: 1   lisinopril (ZESTRIL) 40 MG tablet, Take 1 tablet (40 mg total) by mouth daily., Disp: 90 tablet, Rfl: 3   rosuvastatin (CRESTOR) 10 MG tablet, TAKE 1 TABLET BY MOUTH EVERY DAY AT 6PM, Disp: 90 tablet, Rfl: 3   spironolactone (ALDACTONE) 25 MG tablet, Take 1 tablet (25 mg total) by mouth daily., Disp: 90 tablet, Rfl: 3   Medications ordered in this encounter:  Meds ordered this encounter  Medications    meclizine (ANTIVERT) 25 MG tablet    Sig: Take 1 tablet (25 mg total) by mouth 3 (three) times daily as needed for dizziness.    Dispense:  30 tablet    Refill:  0    Order Specific Question:   Supervising Provider    Answer:   MILLER, BRIAN [[id number]]   fluticasone (FLONASE) 50 MCG/ACT nasal spray    Sig: Place 2 sprays into both nostrils daily.    Dispense:  16 g    Refill:  0    Order Specific Question:   Supervising Provider    Answer:   Hyacinth Meeker, BRIAN [[id number]]     *If you need refills on other medications prior to your next appointment, please contact your pharmacy*  Follow-Up: Call back or seek an in-person evaluation if the symptoms worsen or if the condition fails to improve as anticipated.  If you have been instructed to have an in-person evaluation today at a local Urgent Care facility, please use the link below. It will take you to a list of all of our available [facility], including address, phone number and hours of operation. Please do not delay care.  [facility]  If you or a family member do not have a primary care provider, use the link below to schedule a visit and establish care. When you choose  a [facility] primary care physician or advanced practice provider, you gain a long-term partner in health. Find a Primary Care Provider  Learn more about [facility]'s in-office and virtual care options: [facility] Get Care Now  Benign Positional Vertigo Vertigo is the feeling that you or your surroundings are moving when they are not. Benign positional vertigo is the most common form of vertigo. This is usually a harmless condition (benign). This condition is positional. This means that symptoms are triggered by certain movements and positions. This condition can be dangerous if it occurs while you are doing something that could cause harm to yourself or others. This includes activities such as driving or operating machinery. What are the  causes? The inner ear has fluid-filled canals that help your brain sense movement and balance. When the fluid moves, the brain receives messages about your body's position. With benign positional vertigo, calcium crystals in the inner ear break free and disturb the inner ear area. This causes your brain to receive confusing messages about your body's position. What increases the risk? You are more likely to develop this condition if: You are a woman. You are 57 years of age or older. You have recently had a head injury. You have an inner ear disease. What are the signs or symptoms? Symptoms of this condition usually happen when you move your head or your eyes in different directions. Symptoms may start suddenly and usually last for less than a minute. They include: Loss of balance and falling. Feeling like you are spinning or moving. Feeling like your surroundings are spinning or moving. Nausea and vomiting. Blurred vision. Dizziness. Involuntary eye movement (nystagmus). Symptoms can be mild and cause only minor problems, or they can be severe and interfere with daily life. Episodes of benign positional vertigo may return (recur) over time. Symptoms may also improve over time. How is this diagnosed? This condition may be diagnosed based on: Your medical history. A physical exam of the head, neck, and ears. Positional tests to check for or stimulate vertigo. You may be asked to turn your head and change positions, such as going from sitting to lying down. A health care provider will watch for symptoms of vertigo. You may be referred to a health care provider who specializes in ear, nose, and throat problems (ENT or otolaryngologist) or a provider who specializes in disorders of the nervous system (neurologist). How is this treated? This condition may be treated in a session in which your health care provider moves your head in specific positions to help the displaced crystals in your inner  ear move. Treatment for this condition may take several sessions. Surgery may be needed in severe cases, but this is rare. In some cases, benign positional vertigo may resolve on its own in 2-4 weeks. Follow these instructions at home: Safety Move slowly. Avoid sudden body or head movements or certain positions, as told by your health care provider. Avoid driving or operating machinery until your health care provider says it is safe. Avoid doing any tasks that would be dangerous to you or others if vertigo occurs. If you have trouble walking or keeping your balance, try using a cane for stability. If you feel dizzy or unstable, sit down right away. Return to your normal activities as told by your health care provider. Ask your health care provider what activities are safe for you. General instructions Take over-the-counter and prescription medicines only as told by your health care provider. Drink enough fluid  to keep your urine pale yellow. Keep all follow-up visits. This is important. Contact a health care provider if: You have a fever. Your condition gets worse or you develop new symptoms. Your family or friends notice any behavioral changes. You have nausea or vomiting that gets worse. You have numbness or a prickling and tingling sensation. Get help right away if you: Have difficulty speaking or moving. Are always dizzy or faint. Develop severe headaches. Have weakness in your legs or arms. Have changes in your hearing or vision. Develop a stiff neck. Develop sensitivity to light. These symptoms may represent a serious problem that is an emergency. Do not wait to see if the symptoms will go away. Get medical help right away. Call your local emergency services (911 in the [place] Do not drive yourself to the hospital. Summary Vertigo is the feeling that you or your surroundings are moving when they are not. Benign positional vertigo is the most common form of vertigo. This condition  is caused by calcium crystals in the inner ear that become displaced. This causes a disturbance in an area of the inner ear that helps your brain sense movement and balance. Symptoms include loss of balance and falling, feeling that you or your surroundings are moving, nausea and vomiting, and blurred vision. This condition can be diagnosed based on symptoms, a physical exam, and positional tests. Follow safety instructions as told by your health care provider and keep all follow-up visits. This is important. This information is not intended to replace advice given to you by your health care provider. Make sure you discuss any questions you have with your health care provider. Document Revised: 06/09/2020 Document Reviewed: 06/09/2020 Elsevier Patient Education  2022 [employer]   How to Perform the Epley Maneuver The Epley maneuver is an exercise that relieves symptoms of vertigo. Vertigo is the feeling that you or your surroundings are moving when they are not. When you feel vertigo, you may feel like the room is spinning and may have trouble walking. The Epley maneuver is used for a type of vertigo caused by a calcium deposit in a part of the inner ear. The maneuver involves changing head positions to help the deposit move out of the area. You can do this maneuver at home whenever you have symptoms of vertigo. You can repeat it in 24 hours if your vertigo has not gone away. Even though the Epley maneuver may relieve your vertigo for a few weeks, it is possible that your symptoms will return. This maneuver relieves vertigo, but it does not relieve dizziness. What are the risks? If it is done correctly, the Epley maneuver is considered safe. Sometimes it can lead to dizziness or nausea that goes away after a short time. If you develop other symptoms--such as changes in vision, weakness, or numbness--stop doing the maneuver and call your health care provider. Supplies needed: A bed or table. A  pillow. How to do the Epley maneuver   Sit on the edge of a bed or table with your back straight and your legs extended or hanging over the edge of the bed or table. Turn your head halfway toward the affected ear or side as told by your health care provider. Lie backward quickly with your head turned until you are lying flat on your back. Your head should dangle (head-hanging position). You may want to position a pillow under your shoulders. Hold this position for at least 30 seconds. If you feel dizzy or have symptoms  of vertigo, continue to hold the position until the symptoms stop. Turn your head to the opposite direction until your unaffected ear is facing down. Your head should continue to dangle. Hold this position for at least 30 seconds. If you feel dizzy or have symptoms of vertigo, continue to hold the position until the symptoms stop. Turn your whole body to the same side as your head so that you are positioned on your side. Your head will now be nearly facedown and no longer needs to dangle. Hold for at least 30 seconds. If you feel dizzy or have symptoms of vertigo, continue to hold the position until the symptoms stop. Sit back up. You can repeat the maneuver in 24 hours if your vertigo does not go away. Follow these instructions at home: For 24 hours after doing the Epley maneuver: Keep your head in an upright position. When lying down to sleep or rest, keep your head raised (elevated) with two or more pillows. Avoid excessive neck movements. Activity Do not drive or use machinery if you feel dizzy. After doing the Epley maneuver, return to your normal activities as told by your health care provider. Ask your health care provider what activities are safe for you. General instructions Drink enough fluid to keep your urine pale yellow. Do not drink alcohol. Take over-the-counter and prescription medicines only as told by your health care provider. Keep all follow-up visits. This  is important. Preventing vertigo symptoms Ask your health care provider if there is anything you should do at home to prevent vertigo. He or she may recommend that you: Keep your head elevated with two or more pillows while you sleep. Do not sleep on the side of your affected ear. Get up slowly from bed. Avoid sudden movements during the day. Avoid extreme head positions or movement, such as looking up or bending over. Contact a health care provider if: Your vertigo gets worse. You have other symptoms, including: Nausea. Vomiting. Headache. Get help right away if you: Have vision changes. Have a headache or neck pain that is severe or getting worse. Cannot stop vomiting. Have new numbness or weakness in any part of your body. These symptoms may represent a serious problem that is an emergency. Do not wait to see if the symptoms will go away. Get medical help right away. Call your local emergency services (911 in the [place] Do not drive yourself to the hospital. Summary Vertigo is the feeling that you or your surroundings are moving when they are not. The Epley maneuver is an exercise that relieves symptoms of vertigo. If the Epley maneuver is done correctly, it is considered safe. This information is not intended to replace advice given to you by your health care provider. Make sure you discuss any questions you have with your health care provider. Document Revised: 06/09/2020 Document Reviewed: 06/09/2020 Elsevier Patient Education  2022 [employer].

## 2021-11-04 ENCOUNTER — Other Ambulatory Visit: Payer: Self-pay | Admitting: Family

## 2021-11-04 DIAGNOSIS — F419 Anxiety disorder, unspecified: Secondary | ICD-10-CM

## 2021-11-10 ENCOUNTER — Telehealth: Payer: 59 | Admitting: Physician Assistant

## 2021-11-10 DIAGNOSIS — B9689 Other specified bacterial agents as the cause of diseases classified elsewhere: Secondary | ICD-10-CM | POA: Diagnosis not present

## 2021-11-10 DIAGNOSIS — J019 Acute sinusitis, unspecified: Secondary | ICD-10-CM | POA: Diagnosis not present

## 2021-11-10 DIAGNOSIS — M545 Low back pain, unspecified: Secondary | ICD-10-CM | POA: Diagnosis not present

## 2021-11-10 MED ORDER — AMOXICILLIN-POT CLAVULANATE 875-125 MG PO TABS: 1.0000 | ORAL_TABLET | Freq: Two times a day (BID) | ORAL | 0 refills | Status: DC

## 2021-11-10 MED ORDER — METHYLPREDNISOLONE 4 MG PO TBPK: ORAL_TABLET | ORAL | 0 refills | Status: DC

## 2021-11-10 NOTE — Patient Instructions (Signed)
?Zadie Cleverly, thank you for joining Piedad Climes, PA-C for today's virtual visit.  While this provider is not your primary care provider (PCP), if your PCP is located in our provider database this encounter information will be shared with them immediately following your visit. ? ?Consent: ?(Patient) Dontrail Blackwell provided verbal consent for this virtual visit at the beginning of the encounter. ? ?Current Medications: ? ?Current Outpatient Medications:  ?  amoxicillin-clavulanate (AUGMENTIN) 875-125 MG tablet, Take 1 tablet by mouth 2 (two) times daily., Disp: 14 tablet, Rfl: 0 ?  methylPREDNISolone (MEDROL DOSEPAK) 4 MG TBPK tablet, Take following package directions., Disp: 21 tablet, Rfl: 0 ?  aspirin 81 MG chewable tablet, Chew 1 tablet (81 mg total) by mouth daily., Disp: 30 tablet, Rfl: 11 ?  carvedilol (COREG) 25 MG tablet, Take 1 tablet (25 mg total) by mouth 2 (two) times daily with a meal., Disp: 180 tablet, Rfl: 9 ?  citalopram (CELEXA) 10 MG tablet, TAKE 1 TABLET BY MOUTH EVERY DAY, Disp: 90 tablet, Rfl: 1 ?  clopidogrel (PLAVIX) 75 MG tablet, Take 1 tablet (75 mg total) by mouth daily., Disp: 90 tablet, Rfl: 3 ?  fluticasone (FLONASE) 50 MCG/ACT nasal spray, Place 2 sprays into both nostrils daily., Disp: 16 g, Rfl: 0 ?  lisinopril (ZESTRIL) 40 MG tablet, Take 1 tablet (40 mg total) by mouth daily., Disp: 90 tablet, Rfl: 3 ?  meclizine (ANTIVERT) 25 MG tablet, Take 1 tablet (25 mg total) by mouth 3 (three) times daily as needed for dizziness., Disp: 30 tablet, Rfl: 0 ?  rosuvastatin (CRESTOR) 10 MG tablet, TAKE 1 TABLET BY MOUTH EVERY DAY AT 6PM, Disp: 90 tablet, Rfl: 3 ?  spironolactone (ALDACTONE) 25 MG tablet, Take 1 tablet (25 mg total) by mouth daily., Disp: 90 tablet, Rfl: 3  ? ?Medications ordered in this encounter:  ?Meds ordered this encounter  ?Medications  ? amoxicillin-clavulanate (AUGMENTIN) 875-125 MG tablet  ?  Sig: Take 1 tablet by mouth 2 (two) times daily.  ?  Dispense:  14  tablet  ?  Refill:  0  ?  Order Specific Question:   Supervising Provider  ?  Answer:   Eber Hong [[id number]]  ? methylPREDNISolone (MEDROL DOSEPAK) 4 MG TBPK tablet  ?  Sig: Take following package directions.  ?  Dispense:  21 tablet  ?  Refill:  0  ?  Order Specific Question:   Supervising Provider  ?  Answer:   Eber Hong [[id number]]  ?  ? ?*If you need refills on other medications prior to your next appointment, please contact your pharmacy* ? ?Follow-Up: ?Call back or seek an in-person evaluation if the symptoms worsen or if the condition fails to improve as anticipated. ? ?Other Instructions ?Please take antibiotic as directed.  Increase fluid intake.  Use Saline nasal spray.  Take a daily multivitamin. Continue the Flonase and start the Medrol dose pack -- this should also help with your lower back.  Place a humidifier in the bedroom.  Please call or return clinic if symptoms are not improving. ? ?Sinusitis ?Sinusitis is redness, soreness, and swelling (inflammation) of the paranasal sinuses. Paranasal sinuses are air pockets within the bones of your face (beneath the eyes, the middle of the forehead, or above the eyes). In healthy paranasal sinuses, mucus is able to drain out, and air is able to circulate through them by way of your nose. However, when your paranasal sinuses are inflamed, mucus and air can become trapped. This can  allow bacteria and other germs to grow and cause infection. ?Sinusitis can develop quickly and last only a short time (acute) or continue over a long period (chronic). Sinusitis that lasts for more than 12 weeks is considered chronic.  ?CAUSES  ?Causes of sinusitis include: ?Allergies. ?Structural abnormalities, such as displacement of the cartilage that separates your nostrils (deviated septum), which can decrease the air flow through your nose and sinuses and affect sinus drainage. ?Functional abnormalities, such as when the small hairs (cilia) that line your sinuses and help remove  mucus do not work properly or are not present. ?SYMPTOMS  ?Symptoms of acute and chronic sinusitis are the same. The primary symptoms are pain and pressure around the affected sinuses. Other symptoms include: ?Upper toothache. ?Earache. ?Headache. ?Bad breath. ?Decreased sense of smell and taste. ?A cough, which worsens when you are lying flat. ?Fatigue. ?Fever. ?Thick drainage from your nose, which often is green and may contain pus (purulent). ?Swelling and warmth over the affected sinuses. ?DIAGNOSIS  ?Your caregiver will perform a physical exam. During the exam, your caregiver may: ?Look in your nose for signs of abnormal growths in your nostrils (nasal polyps). ?Tap over the affected sinus to check for signs of infection. ?View the inside of your sinuses (endoscopy) with a special imaging device with a light attached (endoscope), which is inserted into your sinuses. ?If your caregiver suspects that you have chronic sinusitis, one or more of the following tests may be recommended: ?Allergy tests. ?Nasal culture A sample of mucus is taken from your nose and sent to a lab and screened for bacteria. ?Nasal cytology A sample of mucus is taken from your nose and examined by your caregiver to determine if your sinusitis is related to an allergy. ?TREATMENT  ?Most cases of acute sinusitis are related to a viral infection and will resolve on their own within 10 days. Sometimes medicines are prescribed to help relieve symptoms (pain medicine, decongestants, nasal steroid sprays, or saline sprays).  ?However, for sinusitis related to a bacterial infection, your caregiver will prescribe antibiotic medicines. These are medicines that will help kill the bacteria causing the infection.  ?Rarely, sinusitis is caused by a fungal infection. In theses cases, your caregiver will prescribe antifungal medicine. ?For some cases of chronic sinusitis, surgery is needed. Generally, these are cases in which sinusitis recurs more than 3  times per year, despite other treatments. ?HOME CARE INSTRUCTIONS  ?Drink plenty of water. Water helps thin the mucus so your sinuses can drain more easily. ?Use a humidifier. ?Inhale steam 3 to 4 times a day (for example, sit in the bathroom with the shower running). ?Apply a warm, moist washcloth to your face 3 to 4 times a day, or as directed by your caregiver. ?Use saline nasal sprays to help moisten and clean your sinuses. ?Take over-the-counter or prescription medicines for pain, discomfort, or fever only as directed by your caregiver. ?SEEK IMMEDIATE MEDICAL CARE IF: ?You have increasing pain or severe headaches. ?You have nausea, vomiting, or drowsiness. ?You have swelling around your face. ?You have vision problems. ?You have a stiff neck. ?You have difficulty breathing. ?MAKE SURE YOU:  ?Understand these instructions. ?Will watch your condition. ?Will get help right away if you are not doing well or get worse. ?Document Released: 07/10/2005 Document Revised: 10/02/2011 Document Reviewed: 07/25/2011 ?ExitCare? Patient Information ?2014 [employer] [place]. ? ? ? ?If you have been instructed to have an in-person evaluation today at a local Urgent Care facility, please use the  link below. It will take you to a list of all of our available [facility], including address, phone number and hours of operation. Please do not delay care.  ?[facility] ? ?If you or a family member do not have a primary care provider, use the link below to schedule a visit and establish care. When you choose a [facility] primary care physician or advanced practice provider, you gain a long-term partner in health. ?Find a Primary Care Provider ? ?Learn more about [facility]'s in-office and virtual care options: ? - Get Care Now  ?

## 2021-11-10 NOTE — Progress Notes (Signed)
?Virtual Visit Consent  ? ?Steve Burnett, you are scheduled for a virtual visit with a [facility] provider today.   ?  ?Just as with appointments in the office, your consent must be obtained to participate.  Your consent will be active for this visit and any virtual visit you may have with one of our providers in the next 365 days.   ?  ?If you have a MyChart account, a copy of this consent can be sent to you electronically.  All virtual visits are billed to your insurance company just like a traditional visit in the office.   ? ?As this is a virtual visit, video technology does not allow for your provider to perform a traditional examination.  This may limit your provider's ability to fully assess your condition.  If your provider identifies any concerns that need to be evaluated in person or the need to arrange testing (such as labs, EKG, etc.), we will make arrangements to do so.   ?  ?Although advances in technology are sophisticated, we cannot ensure that it will always work on either your end or our end.  If the connection with a video visit is poor, the visit may have to be switched to a telephone visit.  With either a video or telephone visit, we are not always able to ensure that we have a secure connection.    ? ?Also, by engaging in this virtual visit, you consent to the provision of healthcare. Additionally, you authorize for your insurance to be billed (if applicable) for the services provided during this visit.  ? ?I need to obtain your verbal consent now.   Are you willing to proceed with your visit today?  ?  ?Steve Burnett has provided verbal consent on 11/10/2021 for a virtual visit (video or telephone). ?  ?Steve Climes, PA-C  ? ?Date: 11/10/2021 2:16 PM ? ? ?Virtual Visit via Video Note  ? ?Steve Burnett, connected with  Steve Burnett  ([id number], 1960/12/15) on 11/10/21 at  2:15 PM EDT by a video-enabled telemedicine application and verified that I am speaking with the correct  person using two identifiers. ? ?Location: ?Patient: Virtual Visit Location Patient: Home ?Provider: Virtual Visit Location Provider: Home Office ?  ?I discussed the limitations of evaluation and management by telemedicine and the availability of in person appointments. The patient expressed understanding and agreed to proceed.   ? ?History of Present Illness: ?Steve Burnett is a 61 y.o. who identifies as a male who was assigned male at birth, and is being seen today for multiple complaints. . ? 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 Noting about 3 days of flare of lower back pain after having to move feed bags around. Notes bilateral pain that is non-radiating and not associated with weakness, numbness or tingling of lower extremities.  ? ?Endorses several weeks of nasal and head congestion with sinus pressure and pain. Notes some ear pressure and popping bilaterally with L > R. Has history of chronic sinus issues, sometimes requiring treatment for acute on chronic sinusitis. Denies fever, chills, nasal discharge, chest congestion or cough. Some dizziness with ear pressure and popping. Notes he restarted his Flonase about 5 days ago which has helped slightly but not resolving the issue. Uses a CPAP but always uses distilled water with this.  ? ?HPI: HPI  ?Problems:  ?Patient Active Problem List  ? Diagnosis Date Noted  ? Type 2 diabetes mellitus with hyperglycemia, without long-term current use  of insulin (HCC) 04/21/2020  ? BPH (benign prostatic hyperplasia) 04/09/2020  ? Dyshidrotic hand dermatitis 04/09/2020  ? Ischemic cardiomyopathy 01/25/2018  ? STEMI involving oth coronary artery of inferior wall (HCC) 01/23/2018  ? OSA (obstructive sleep apnea) 03/27/2014  ? Anxiety 07/31/2012  ? Hyperlipidemia   ? Essential hypertension   ? Coronary artery disease 03/12/2012  ?  ?Allergies:  ?Allergies  ?Allergen Reactions  ? Flomax [Tamsulosin] Nausea Only  ?  Upset, lightheaded  ? Atorvastatin Other (See Comments)   ?  Muscle cramps ?  ? Crestor [Rosuvastatin Calcium] Other (See Comments)  ?  Cramps  ? ?Medications:  ?Current Outpatient Medications:  ?  amoxicillin-clavulanate (AUGMENTIN) 875-125 MG tablet, Take 1 tablet by mouth 2 (two) times daily., Disp: 14 tablet, Rfl: 0 ?  methylPREDNISolone (MEDROL DOSEPAK) 4 MG TBPK tablet, Take following package directions., Disp: 21 tablet, Rfl: 0 ?  aspirin 81 MG chewable tablet, Chew 1 tablet (81 mg total) by mouth daily., Disp: 30 tablet, Rfl: 11 ?  carvedilol (COREG) 25 MG tablet, Take 1 tablet (25 mg total) by mouth 2 (two) times daily with a meal., Disp: 180 tablet, Rfl: 9 ?  citalopram (CELEXA) 10 MG tablet, TAKE 1 TABLET BY MOUTH EVERY DAY, Disp: 90 tablet, Rfl: 1 ?  clopidogrel (PLAVIX) 75 MG tablet, Take 1 tablet (75 mg total) by mouth daily., Disp: 90 tablet, Rfl: 3 ?  fluticasone (FLONASE) 50 MCG/ACT nasal spray, Place 2 sprays into both nostrils daily., Disp: 16 g, Rfl: 0 ?  lisinopril (ZESTRIL) 40 MG tablet, Take 1 tablet (40 mg total) by mouth daily., Disp: 90 tablet, Rfl: 3 ?  meclizine (ANTIVERT) 25 MG tablet, Take 1 tablet (25 mg total) by mouth 3 (three) times daily as needed for dizziness., Disp: 30 tablet, Rfl: 0 ?  rosuvastatin (CRESTOR) 10 MG tablet, TAKE 1 TABLET BY MOUTH EVERY DAY AT 6PM, Disp: 90 tablet, Rfl: 3 ?  spironolactone (ALDACTONE) 25 MG tablet, Take 1 tablet (25 mg total) by mouth daily., Disp: 90 tablet, Rfl: 3 ? ?Observations/Objective: ?Patient is well-developed, well-nourished in no acute distress.  ?Resting comfortably at home.  ?Head is normocephalic, atraumatic.  ?No labored breathing. ?Speech is clear and coherent with logical content.  ?Patient is alert and oriented at baseline.  ? ?Assessment and Plan: ?1. Acute bacterial sinusitis ?- amoxicillin-clavulanate (AUGMENTIN) 875-125 MG tablet; Take 1 tablet by mouth 2 (two) times daily.  Dispense: 14 tablet; Refill: 0 ?- methylPREDNISolone (MEDROL DOSEPAK) 4 MG TBPK tablet; Take following  package directions.  Dispense: 21 tablet; Refill: 0 ? ?Rx Augmentin.  Increase fluids.  Rest.  Saline nasal spray.  Probiotic.  Mucinex as directed.  Humidifier in bedroom. Medrol per orders.  Call or return to clinic if symptoms are not improving. ? ? ?2. Acute bilateral low back pain without sciatica ?- methylPREDNISolone (MEDROL DOSEPAK) 4 MG TBPK tablet; Take following package directions.  Dispense: 21 tablet; Refill: 0 ? ?Acute flare up. No alarm signs or symptoms. Tylenol OTC. Supportive measures reviewed. Medrol dose pack.  ? ?Follow Up Instructions: ?I discussed the assessment and treatment plan with the patient. The patient was provided an opportunity to ask questions and all were answered. The patient agreed with the plan and demonstrated an understanding of the instructions.  A copy of instructions were sent to the patient via MyChart unless otherwise noted below.  ? ?The patient was advised to call back or seek an in-person evaluation if the symptoms worsen or if the condition  fails to improve as anticipated. ? ?Time:  ?I spent 10 minutes with the patient via telehealth technology discussing the above problems/concerns.   ? ?Steve Rio, PA-C ?

## 2022-05-23 ENCOUNTER — Other Ambulatory Visit: Payer: Self-pay | Admitting: Nurse Practitioner

## 2022-05-24 NOTE — Telephone Encounter (Signed)
This is a [place] pt 

## 2022-05-24 NOTE — Telephone Encounter (Signed)
Please schedule overdue 6 month F/U appt for refills. Thank you! 

## 2022-05-25 NOTE — Telephone Encounter (Signed)
Spoke with pt and he stated he is too busy right now to make an appointment and has no money to pay for it. He has a lot going on right now and is just unable to deal with this right now.

## 2022-05-29 ENCOUNTER — Encounter: Payer: Self-pay | Admitting: Cardiovascular Disease

## 2022-06-09 ENCOUNTER — Other Ambulatory Visit: Payer: Self-pay | Admitting: Nurse Practitioner

## 2022-06-09 NOTE — Telephone Encounter (Signed)
Needs appt for refills. Last seen 02/2021. Thank you!

## 2022-06-12 NOTE — Telephone Encounter (Signed)
LMOV  

## 2022-06-24 ENCOUNTER — Other Ambulatory Visit: Payer: Self-pay | Admitting: Cardiovascular Disease

## 2022-09-18 ENCOUNTER — Telehealth: Payer: Self-pay | Admitting: *Deleted

## 2022-09-18 NOTE — Telephone Encounter (Signed)
   [facility] Pre-operative Risk Assessment    Patient Name: Steve Burnett  DOB: [birth year] MRN: [id number]  HEARTCARE STAFF:  - IMPORTANT!!!!!! Under Visit Info/Reason for Call, type in Other and utilize the format Clearance MM/DD/YY or Clearance TBD. Do not use dashes or single digits. - Please review there is not already an duplicate clearance open for this procedure. - If request is for dental extraction, please clarify the # of teeth to be extracted. - If the patient is currently at the dentist's office, call Pre-Op Callback Staff (MA/nurse) to input urgent request.  - If the patient is not currently in the dentist office, please route to the Pre-Op pool.  Request for surgical clearance:  What type of surgery is being performed? Surgical dental extractions of 15 teeth, with immediate dentures  When is this surgery scheduled? TBD  What type of clearance is required (medical clearance vs. Pharmacy clearance to hold med vs. Both)? Both  Are there any medications that need to be held prior to surgery and how long? Plavix and Asa 81  Practice name and name of physician performing surgery? [facility], Dr Gerrit Friends  What is the office phone number? [phone]   7.   What is the office fax number? [phone]  8.   Anesthesia type (None, local, MAC, general) ? Local   Lia Hopping, Flor Houdeshell L 09/18/2022, 3:24 PM  _________________________________________________________________   (provider comments below)

## 2022-09-19 NOTE — Telephone Encounter (Signed)
    Patient Name: Steve Burnett  DOB: [birth year] MRN: [id number]  Primary Cardiologist: Kathlyn Sacramento, MD  Chart reviewed as part of pre-operative protocol coverage.   The patient already has an upcoming appointment scheduled 09/28/22 at which time this clearance should be addressed. Patient has not been seen since 2022 therefore requires in-office visit before clearing. He is being seen by Dr. Fletcher Anon who can review and address antiplatelets +/- need for SBE ppx at time of visit.  - I added "preop" comment to appointment notes so that provider is aware to address at time of OV. Per office protocol, the provider seeing this patient should forward their finalized clearance decision to requesting party below.  - Will fax update to requesting surgeon so they are aware. Will remove from preop box as separate preop APP input not necessary at this time.  Charlie Pitter, PA-C 09/19/2022, 8:17 AM

## 2022-09-23 ENCOUNTER — Other Ambulatory Visit: Payer: Self-pay

## 2022-09-23 ENCOUNTER — Emergency Department: Payer: 59

## 2022-09-23 ENCOUNTER — Emergency Department
Admission: EM | Admit: 2022-09-23 | Discharge: 2022-09-23 | Disposition: A | Discharge status: Home / Self Care | Payer: 59 | Attending: Emergency Medicine | Admitting: Emergency Medicine

## 2022-09-23 DIAGNOSIS — I10 Essential (primary) hypertension: Secondary | ICD-10-CM | POA: Insufficient documentation

## 2022-09-23 DIAGNOSIS — S39012A Strain of muscle, fascia and tendon of lower back, initial encounter: Secondary | ICD-10-CM | POA: Insufficient documentation

## 2022-09-23 DIAGNOSIS — X509XXA Other and unspecified overexertion or strenuous movements or postures, initial encounter: Secondary | ICD-10-CM | POA: Diagnosis not present

## 2022-09-23 DIAGNOSIS — S3992XA Unspecified injury of lower back, initial encounter: Secondary | ICD-10-CM | POA: Diagnosis present

## 2022-09-23 DIAGNOSIS — G8929 Other chronic pain: Secondary | ICD-10-CM

## 2022-09-23 DIAGNOSIS — I2510 Atherosclerotic heart disease of native coronary artery without angina pectoris: Secondary | ICD-10-CM | POA: Diagnosis not present

## 2022-09-23 LAB — TROPONIN I (HIGH SENSITIVITY): Troponin I (High Sensitivity): 12 ng/L (ref <18)

## 2022-09-23 LAB — CBC WITH DIFFERENTIAL/PLATELET
Abs Immature Granulocytes: 0.02 10*3/uL (ref 0.00–0.07)
Basophils Absolute: 0.1 10*3/uL (ref 0.0–0.1)
Basophils Relative: 1 %
Eosinophils Absolute: 0.2 10*3/uL (ref 0.0–0.5)
Eosinophils Relative: 2 %
HCT: 44.8 % (ref 39.0–52.0)
Hemoglobin: 14.1 g/dL (ref 13.0–17.0)
Immature Granulocytes: 0 %
Lymphocytes Relative: 21 %
Lymphs Abs: 1.6 10*3/uL (ref 0.7–4.0)
MCH: 25.2 pg — ABNORMAL LOW (ref 26.0–34.0)
MCHC: 31.5 g/dL (ref 30.0–36.0)
MCV: 80 fL (ref 80.0–100.0)
Monocytes Absolute: 0.6 10*3/uL (ref 0.1–1.0)
Monocytes Relative: 8 %
Neutro Abs: 5.2 10*3/uL (ref 1.7–7.7)
Neutrophils Relative %: 68 %
Platelets: 297 10*3/uL (ref 150–400)
RBC: 5.6 MIL/uL (ref 4.22–5.81)
RDW: 15.7 % — ABNORMAL HIGH (ref 11.5–15.5)
WBC: 7.5 10*3/uL (ref 4.0–10.5)
nRBC: 0 % (ref 0.0–0.2)

## 2022-09-23 LAB — COMPREHENSIVE METABOLIC PANEL
ALT: 24 U/L (ref 0–44)
AST: 22 U/L (ref 15–41)
Albumin: 3.7 g/dL (ref 3.5–5.0)
Alkaline Phosphatase: 65 U/L (ref 38–126)
Anion gap: 11 (ref 5–15)
BUN: 18 mg/dL (ref 6–20)
CO2: 22 mmol/L (ref 22–32)
Calcium: 9 mg/dL (ref 8.9–10.3)
Chloride: 103 mmol/L (ref 98–111)
Creatinine, Ser: 0.84 mg/dL (ref 0.61–1.24)
GFR, Estimated: >60 mL/min (ref >60)
Glucose, Bld: 153 mg/dL — ABNORMAL HIGH (ref 70–99)
Potassium: 3.8 mmol/L (ref 3.5–5.1)
Sodium: 136 mmol/L (ref 135–145)
Total Bilirubin: 0.6 mg/dL (ref 0.3–1.2)
Total Protein: 7.6 g/dL (ref 6.5–8.1)

## 2022-09-23 MED ORDER — LIDOCAINE 5 % EX PTCH: 1.0000 | MEDICATED_PATCH | Freq: Two times a day (BID) | CUTANEOUS | 1 refills | Status: AC

## 2022-09-23 MED ORDER — METHOCARBAMOL 500 MG PO TABS
500.0000 mg | ORAL_TABLET | Freq: Once | ORAL | Status: AC
  Administered 2022-09-23: 500 mg via ORAL
  Filled 2022-09-23 (×2): qty 1

## 2022-09-23 MED ORDER — DIAZEPAM 5 MG PO TABS
5.0000 mg | ORAL_TABLET | Freq: Once | ORAL | Status: AC
Start: 2022-09-23 — End: 2022-09-23
  Administered 2022-09-23: 5 mg via ORAL
  Filled 2022-09-23: qty 1

## 2022-09-23 MED ORDER — DEXAMETHASONE SODIUM PHOSPHATE 10 MG/ML IJ SOLN
10.0000 mg | Freq: Once | INTRAMUSCULAR | Status: AC
  Administered 2022-09-23: 10 mg via INTRAMUSCULAR
  Filled 2022-09-23: qty 1

## 2022-09-23 MED ORDER — LIDOCAINE 5 % EX PTCH
1.0000 | MEDICATED_PATCH | CUTANEOUS | Status: DC
  Administered 2022-09-23: 1 via TRANSDERMAL
  Filled 2022-09-23: qty 1

## 2022-09-23 MED ORDER — HYDROXYZINE HCL 25 MG PO TABS: 25.0000 mg | ORAL_TABLET | Freq: Three times a day (TID) | ORAL | 1 refills | Status: DC | PRN

## 2022-09-23 MED ORDER — KETOROLAC TROMETHAMINE 30 MG/ML IJ SOLN
30.0000 mg | Freq: Once | INTRAMUSCULAR | Status: AC
  Administered 2022-09-23: 30 mg via INTRAMUSCULAR
  Filled 2022-09-23: qty 1

## 2022-09-23 MED ORDER — HYDROXYZINE HCL 25 MG PO TABS
25.0000 mg | ORAL_TABLET | Freq: Once | ORAL | Status: AC
Start: 2022-09-23 — End: 2022-09-23
  Administered 2022-09-23: 25 mg via ORAL
  Filled 2022-09-23: qty 1

## 2022-09-23 MED ORDER — METHOCARBAMOL 500 MG PO TABS: 500.0000 mg | ORAL_TABLET | Freq: Three times a day (TID) | ORAL | 1 refills | Status: DC | PRN

## 2022-09-23 MED ORDER — ACETAMINOPHEN 500 MG PO TABS
1000.0000 mg | ORAL_TABLET | Freq: Once | ORAL | Status: AC
  Administered 2022-09-23: 1000 mg via ORAL
  Filled 2022-09-23: qty 2

## 2022-09-23 NOTE — ED Triage Notes (Signed)
BIB [facility] from home for back pain x 2 days. HX of chronic back pain. Pt lifted both feet up last night and  tweaked some but sharp. Pt was able to go to work today.   Pt feels he was SOB due to having a panic attack.   Pt denies any meds for chronic back pain.   But took 4 advil tonight.   Vitals for EMS: 185/93 96% RA  82H

## 2022-09-23 NOTE — ED Provider Notes (Signed)
[facility] Provider Note    Event Date/Time   First MD Initiated Contact with Patient 09/23/22 [phone]     (approximate)   History   Back Pain   HPI  Steve Burnett is a 62 y.o. male who presents to the ED for evaluation of Back Pain    I review a cardiology clinic visit from 2022.  Morbidly obese patient with a BMI 55.  He has a history of CAD, HTN, HLD, slightly reduced ejection fraction and has had multiple stents placed in the past.  Also reports a history of chronic low back pain and is quite " used to my back pain."  He reports a leaning/reaching injury that occurred about 4 days ago when he was reaching down to pull up his briefs.  He had no falls, injuries or trauma to his back.  He reports acute on chronic pain with bilateral lumbar pain and bilateral sciatica since that time.  Denies any fevers, saddle anesthesias or weakness to his legs.  He is typically independently ambulatory without assistance device.  Reports he took 1 day off work earlier this week, but did work the past couple days.  Tonight before bed he took some ibuprofen.  He awoke around midnight to go to the bathroom, but reports he was unable to get up out of the bed due to his back pain.  Due to this and "having a small wife who cannot really help" he called 911 for evaluation.  After he realized he was unable to get up, he reports having a "panic attack."  He reports history of panic attacks and often feeling dyspneic with his panic attacks in the past.  He reports a similar sensation now.  Denies any frontal chest discomfort, dyspnea on exertion or chest discomfort on exertion the past few days alongside all of this.  Here in the ED, he is reporting 3/10 intensity pain, but spasms or waves of some more severe pain.   Physical Exam   Triage Vital Signs: ED Triage Vitals  Enc Vitals Group     BP 09/23/22 0231 (!) 211/119     Pulse Rate 09/23/22 0227 84     Resp 09/23/22 0227 20      Temp 09/23/22 0227 98.8 F (37.1 C)     Temp Source 09/23/22 0227 Oral     SpO2 09/23/22 0227 98 %     Weight 09/23/22 0223 (!) 382 lb 11.5 oz (173.6 kg)     Height 09/23/22 0223 '5\' 10"'$  (1.778 m)     Head Circumference --      Peak Flow --      Pain Score 09/23/22 0223 10     Pain Loc --      Pain Edu? --      Excl. in [place]? --     Most recent vital signs: Vitals:   09/23/22 0330 09/23/22 0400  BP: (!) 176/92   Pulse: 82 87  Resp: 18 19  Temp:    SpO2: 95% 96%    General: Awake, no distress.  Morbidly obese.  Pleasant and conversational. CV:  Good peripheral perfusion.  Resp:  Normal effort.  Clear lungs Abd:  No distention.  MSK:  No deformity noted.  Left greater than right paraspinal lumbar tenderness.  No overlying skin changes, rash, swelling or redness.  No midline tenderness.  No step-offs. Neuro:  No focal deficits appreciated. Cranial nerves II through XII intact 5/5 strength and sensation in all  4 extremities Other:     ED Results / Procedures / Treatments   Labs (all labs ordered are listed, but only abnormal results are displayed) Labs Reviewed  COMPREHENSIVE METABOLIC PANEL - Abnormal; Notable for the following components:      Result Value   Glucose, Bld 153 (*)    All other components within normal limits  CBC WITH DIFFERENTIAL/PLATELET - Abnormal; Notable for the following components:   MCH 25.2 (*)    RDW 15.7 (*)    All other components within normal limits  URINALYSIS, ROUTINE W REFLEX MICROSCOPIC  TROPONIN I (HIGH SENSITIVITY)  TROPONIN I (HIGH SENSITIVITY)    EKG Sinus rhythm with a rate of 80 bpm.  Normal axis and intervals.  No clear signs of acute ischemia.  RADIOLOGY CXR interpreted by me without evidence of acute cardiopulmonary pathology.  Official radiology report(s): DG Chest Portable 1 View  Result Date: 09/23/2022 CLINICAL DATA:  Shortness of breath and back pain EXAM: PORTABLE CHEST 1 VIEW COMPARISON:  01/23/2018 FINDINGS:  The heart size and mediastinal contours are within normal limits. Both lungs are clear. The visualized skeletal structures are unremarkable. IMPRESSION: No active disease. Electronically Signed   By: Inez Catalina M.D.   On: 09/23/2022 02:43    PROCEDURES and INTERVENTIONS:  .1-3 Lead EKG Interpretation  Performed by: Vladimir Crofts, MD Authorized by: Vladimir Crofts, MD     Interpretation: normal     ECG rate:  80   ECG rate assessment: normal     Rhythm: sinus rhythm     Ectopy: none     Conduction: normal     Medications  lidocaine (LIDODERM) 5 % 1 patch (1 patch Transdermal Patch Applied 09/23/22 0246)  hydrOXYzine (ATARAX) tablet 25 mg (has no administration in time range)  acetaminophen (TYLENOL) tablet 1,000 mg (1,000 mg Oral Given 09/23/22 0242)  methocarbamol (ROBAXIN) tablet 500 mg (500 mg Oral Given 09/23/22 0242)  ketorolac (TORADOL) 30 MG/ML injection 30 mg (30 mg Intramuscular Given 09/23/22 0247)  dexamethasone (DECADRON) injection 10 mg (10 mg Intramuscular Given 09/23/22 0418)  diazepam (VALIUM) tablet 5 mg (5 mg Oral Given 09/23/22 0418)     IMPRESSION / MDM / ASSESSMENT AND PLAN / ED COURSE  I reviewed the triage vital signs and the nursing notes.  Differential diagnosis includes, but is not limited to, ACS, PTX, PNA, muscle strain/spasm, PE, dissection, muscular spasm, cauda equina  {Patient presents with symptoms of an acute illness or injury that is potentially life-threatening.  Morbidly obese male presents with acute on chronic atraumatic back pain without red flag features and ultimately suitable for outpatient management.  Is reporting presenting dyspnea, likely a panic attack, but with his ACS history we do work this up.  EKG is nonischemic and troponin is negative.  CXR is clear and screening blood work is benign with a normal CBC and metabolic panel.  No indications for spinal imaging or MRI of the back.  Pain controlled with multimodal analgesia.  We discussed management  at home and return precautions.  Provided information to establish with a PCP.  Clinical Course as of 09/23/22 0524  Sat Sep 23, 2022  0410 Reassessed.  Reports feeling a little bit better but still spasming.  A provide minimal assistance and he is able to get up and ambulatory around the room, reports feeling better when he is vertical.  He states himself down to the bedside chair and reports feeling better.  He asks about steroids as he has  had improvement with steroids in the past for his back pain. [DS]  [id number] Reassessed.  Up and walking around the room, he was able to put his own shoes on bilaterally.   [DS]    Clinical Course User Index [DS] Vladimir Crofts, MD     FINAL CLINICAL IMPRESSION(S) / ED DIAGNOSES   Final diagnoses:  Strain of lumbar region, initial encounter  Chronic left-sided low back pain with left-sided sciatica     Rx / DC Orders   ED Discharge Orders          Ordered    lidocaine (LIDODERM) 5 %  Every 12 hours        09/23/22 0502    methocarbamol (ROBAXIN) 500 MG tablet  Every 8 hours PRN        09/23/22 0502    hydrOXYzine (ATARAX) 25 MG tablet  3 times daily PRN        09/23/22 0502             Note:  This document was prepared using Dragon voice recognition software and may include unintentional dictation errors.   Vladimir Crofts, MD 09/23/22 [phone]

## 2022-09-23 NOTE — Discharge Instructions (Signed)
Use Tylenol for pain and fevers.  Up to 1000 mg per dose, up to 4 times per day.  Do not take more than 4000 mg of Tylenol/acetaminophen within 24 hours..  Use naproxen/Aleve for anti-inflammatory pain relief. Use up to '500mg'$  every 12 hours. Do not take more frequently than this. Do not use other NSAIDs (ibuprofen, Advil) while taking this medication. It is safe to take Tylenol with this.   Please use lidocaine patches at your site of pain.  Apply 1 patch at a time, leave on for 12 hours, then remove for 12 hours.  12 hours on, 12 hours off.  Do not apply more than 1 patch at a time.  Use Robaxin muscle relaxer for more severe/breakthrough pain  Use hydroxyzine/Atarax for anxiety  Please go to the following website to schedule new (and existing) patient appointments:   [email or web address]   The following is a list of primary care offices in the area who are accepting new patients at this time.  Please reach out to one of them directly and let them know you would like to schedule an appointment to follow up on an Emergency Department visit, and/or to establish a new primary care provider (PCP).  There are likely other primary care clinics in the are who are accepting new patients, but this is an excellent place to start:  [place] physician: Dr Lavon Paganini [address] [place], [place] [address] [phone]  [facility] Lead Physician: Dr Steele Sizer [address], [place], [place] [address] [phone]  [place] Physician: Dr Park Liter [address] [place], [place] [address] [phone]  [facility] Lead Physician: Dr Dewaine Oats [address], [place], [place] [address] [phone]  [facility] [place] Physician: Dr Halina Maidens [address] [place], [place],  [address] [phone]

## 2022-09-25 ENCOUNTER — Telehealth: Payer: 59 | Admitting: Physician Assistant

## 2022-09-25 DIAGNOSIS — M545 Low back pain, unspecified: Secondary | ICD-10-CM

## 2022-09-25 MED ORDER — METHYLPREDNISOLONE 4 MG PO TBPK: ORAL_TABLET | ORAL | 0 refills | Status: DC

## 2022-09-25 NOTE — Progress Notes (Signed)
Virtual Visit Consent   Steve Burnett, you are scheduled for a virtual visit with a [facility] provider today. Just as with appointments in the office, your consent must be obtained to participate. Your consent will be active for this visit and any virtual visit you may have with one of our providers in the next 365 days. If you have a MyChart account, a copy of this consent can be sent to you electronically.  As this is a virtual visit, video technology does not allow for your provider to perform a traditional examination. This may limit your provider's ability to fully assess your condition. If your provider identifies any concerns that need to be evaluated in person or the need to arrange testing (such as labs, EKG, etc.), we will make arrangements to do so. Although advances in technology are sophisticated, we cannot ensure that it will always work on either your end or our end. If the connection with a video visit is poor, the visit may have to be switched to a telephone visit. With either a video or telephone visit, we are not always able to ensure that we have a secure connection.  By engaging in this virtual visit, you consent to the provision of healthcare and authorize for your insurance to be billed (if applicable) for the services provided during this visit. Depending on your insurance coverage, you may receive a charge related to this service.  I need to obtain your verbal consent now. Are you willing to proceed with your visit today? Steve Burnett has provided verbal consent on 09/25/2022 for a virtual visit (video or telephone). Mar Daring, PA-C  Date: 09/25/2022 10:05 AM  Virtual Visit via Video Note   I, Mar Daring, connected with  Steve Burnett  ([id number], 1960-10-03) on 09/25/22 at 10:15 AM EST by a video-enabled telemedicine application and verified that I am speaking with the correct person using two identifiers.  Location: Patient: Virtual Visit Location  Patient: Home Provider: Virtual Visit Location Provider: Home Office   I discussed the limitations of evaluation and management by telemedicine and the availability of in person appointments. The patient expressed understanding and agreed to proceed.    History of Present Illness: Steve Burnett is a 62 y.o. who identifies as a male who was assigned male at birth, and is being seen today for back pain.  HPI: Back Pain This is a recurrent problem. The current episode started in the past 7 days (went to ER on 09/23/22, diagnosed with lumbar strain). The problem occurs constantly. The problem has been gradually improving since onset. The pain is present in the gluteal and lumbar spine. The quality of the pain is described as aching, shooting and stabbing. The pain does not radiate. Pain scale: Saturday was 10/10, now is about 7/10. The pain is moderate. The pain is The same all the time. The symptoms are aggravated by position. Stiffness is present All day and in the morning. Associated symptoms include weakness. Pertinent negatives include no bladder incontinence, bowel incontinence, headaches, perianal numbness or tingling. He has tried NSAIDs, muscle relaxant, bed rest and heat for the symptoms. The treatment provided mild relief.     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   Ischemic cardiomyopathy 01/25/2018   STEMI involving oth coronary artery of inferior  wall ([place]) 01/23/2018   OSA (obstructive sleep apnea) 03/27/2014   Anxiety 07/31/2012   Hyperlipidemia    Essential hypertension    Coronary artery disease 03/12/2012    Allergies:  Allergies  Allergen Reactions   Flomax [Tamsulosin] Nausea Only    Upset, lightheaded   Atorvastatin Other (See Comments)    Muscle  cramps    Crestor [Rosuvastatin Calcium] Other (See Comments)    Cramps   Medications:  Current Outpatient Medications:    methylPREDNISolone (MEDROL DOSEPAK) 4 MG TBPK tablet, 6 day taper; take as directed on package instructions, Disp: 21 tablet, Rfl: 0   amoxicillin-clavulanate (AUGMENTIN) 875-125 MG tablet, Take 1 tablet by mouth 2 (two) times daily., Disp: 14 tablet, Rfl: 0   aspirin 81 MG chewable tablet, Chew 1 tablet (81 mg total) by mouth daily., Disp: 30 tablet, Rfl: 11   carvedilol (COREG) 25 MG tablet, Take 1 tablet (25 mg total) by mouth 2 (two) times daily with a meal., Disp: 180 tablet, Rfl: 9   citalopram (CELEXA) 10 MG tablet, TAKE 1 TABLET BY MOUTH EVERY DAY, Disp: 90 tablet, Rfl: 1   clopidogrel (PLAVIX) 75 MG tablet, Take 1 tablet (75 mg total) by mouth daily. SCHEDULE OFFICE VISIT FOR FUTURE REFILLS. 2ND ATTEMPT, Disp: 30 tablet, Rfl: 0   fluticasone (FLONASE) 50 MCG/ACT nasal spray, Place 2 sprays into both nostrils daily., Disp: 16 g, Rfl: 0   hydrOXYzine (ATARAX) 25 MG tablet, Take 1 tablet (25 mg total) by mouth 3 (three) times daily as needed for anxiety., Disp: 30 tablet, Rfl: 1   lidocaine (LIDODERM) 5 %, Place 1 patch onto the skin every 12 (twelve) hours. Remove & Discard patch within 12 hours or as directed by MD, Disp: 10 patch, Rfl: 1   lisinopril (ZESTRIL) 40 MG tablet, TAKE 1 TABLET BY MOUTH EVERY DAY, Disp: 90 tablet, Rfl: 3   meclizine (ANTIVERT) 25 MG tablet, Take 1 tablet (25 mg total) by mouth 3 (three) times daily as needed for dizziness., Disp: 30 tablet, Rfl: 0   methocarbamol (ROBAXIN) 500 MG tablet, Take 1 tablet (500 mg total) by mouth every 8 (eight) hours as needed for muscle spasms., Disp: 30 tablet, Rfl: 1   rosuvastatin (CRESTOR) 10 MG tablet, TAKE 1 TABLET BY MOUTH EVERY DAY AT 6PM, Disp: 90 tablet, Rfl: 3   spironolactone (ALDACTONE) 25 MG tablet, Take 1 tablet (25 mg total) by mouth daily., Disp: 90 tablet, Rfl:  3  Observations/Objective: Patient is well-developed, well-nourished in no acute distress.  Resting comfortably at home.  Head is normocephalic, atraumatic.  No labored breathing.  Speech is clear and coherent with logical content.  Patient is alert and oriented at baseline.    Assessment and Plan: 1. Acute bilateral low back pain without sciatica - methylPREDNISolone (MEDROL DOSEPAK) 4 MG TBPK tablet; 6 day taper; take as directed on package instructions  Dispense: 21 tablet; Refill: 0  - Suspect Lumbar strain - Failed NSAIDs - Add Medrol dose pack  - Continue methocarbamol for muscle relaxant - Avoid NSAIDs (Ibuprofen/Advil/Motrin or Naproxen/Aleve) while on steroid - Tylenol if okay for breakthrough pain - Heat to area - Epsom salt soak if able to get in and out of bath tub safely - Back exercises and stretches provided via AVS - Seek in person evaluation if worsening or fails to improve with treatment   Follow Up Instructions: I discussed the assessment and treatment plan with the patient. The patient was provided an opportunity to ask questions  and all were answered. The patient agreed with the plan and demonstrated an understanding of the instructions.  A copy of instructions were sent to the patient via MyChart unless otherwise noted below.    The patient was advised to call back or seek an in-person evaluation if the symptoms worsen or if the condition fails to improve as anticipated.  Time:  I spent 10 minutes with the patient via telehealth technology discussing the above problems/concerns.    Mar Daring, PA-C

## 2022-09-25 NOTE — Patient Instructions (Addendum)
Steve Burnett, thank you for joining Mar Daring, PA-C for today's virtual visit.  While this provider is not your primary care provider (PCP), if your PCP is located in our provider database this encounter information will be shared with them immediately following your visit.   [facility] account gives you access to today's visit and all your visits, tests, and labs performed at [facility] " click here if you don't have a [facility] account or go to [email or web address]  Consent: (Patient) Steve Burnett provided verbal consent for this virtual visit at the beginning of the encounter.  Current Medications:  Current Outpatient Medications:    methylPREDNISolone (MEDROL DOSEPAK) 4 MG TBPK tablet, 6 day taper; take as directed on package instructions, Disp: 21 tablet, Rfl: 0   amoxicillin-clavulanate (AUGMENTIN) 875-125 MG tablet, Take 1 tablet by mouth 2 (two) times daily., Disp: 14 tablet, Rfl: 0   aspirin 81 MG chewable tablet, Chew 1 tablet (81 mg total) by mouth daily., Disp: 30 tablet, Rfl: 11   carvedilol (COREG) 25 MG tablet, Take 1 tablet (25 mg total) by mouth 2 (two) times daily with a meal., Disp: 180 tablet, Rfl: 9   citalopram (CELEXA) 10 MG tablet, TAKE 1 TABLET BY MOUTH EVERY DAY, Disp: 90 tablet, Rfl: 1   clopidogrel (PLAVIX) 75 MG tablet, Take 1 tablet (75 mg total) by mouth daily. SCHEDULE OFFICE VISIT FOR FUTURE REFILLS. 2ND ATTEMPT, Disp: 30 tablet, Rfl: 0   fluticasone (FLONASE) 50 MCG/ACT nasal spray, Place 2 sprays into both nostrils daily., Disp: 16 g, Rfl: 0   hydrOXYzine (ATARAX) 25 MG tablet, Take 1 tablet (25 mg total) by mouth 3 (three) times daily as needed for anxiety., Disp: 30 tablet, Rfl: 1   lidocaine (LIDODERM) 5 %, Place 1 patch onto the skin every 12 (twelve) hours. Remove & Discard patch within 12 hours or as directed by MD, Disp: 10 patch, Rfl: 1   lisinopril (ZESTRIL) 40 MG tablet, TAKE 1 TABLET BY MOUTH EVERY  DAY, Disp: 90 tablet, Rfl: 3   meclizine (ANTIVERT) 25 MG tablet, Take 1 tablet (25 mg total) by mouth 3 (three) times daily as needed for dizziness., Disp: 30 tablet, Rfl: 0   methocarbamol (ROBAXIN) 500 MG tablet, Take 1 tablet (500 mg total) by mouth every 8 (eight) hours as needed for muscle spasms., Disp: 30 tablet, Rfl: 1   rosuvastatin (CRESTOR) 10 MG tablet, TAKE 1 TABLET BY MOUTH EVERY DAY AT 6PM, Disp: 90 tablet, Rfl: 3   spironolactone (ALDACTONE) 25 MG tablet, Take 1 tablet (25 mg total) by mouth daily., Disp: 90 tablet, Rfl: 3   Medications ordered in this encounter:  Meds ordered this encounter  Medications   methylPREDNISolone (MEDROL DOSEPAK) 4 MG TBPK tablet    Sig: 6 day taper; take as directed on package instructions    Dispense:  21 tablet    Refill:  0    Order Specific Question:   Supervising Provider    Answer:   Chase Picket [id number]     *If you need refills on other medications prior to your next appointment, please contact your pharmacy*  Follow-Up: Call back or seek an in-person evaluation if the symptoms worsen or if the condition fails to improve as anticipated.  [place] [phone]  Other Instructions [facility] [place] [address] Phone: [phone] Urgent Care Hours: Monday- Sunday 9 am - 9 pm  Back Exercises The following exercises strengthen  the muscles that help to support the trunk (torso) and back. They also help to keep the lower back flexible. Doing these exercises can help to prevent or lessen existing low back pain. If you have back pain or discomfort, try doing these exercises 2-3 times each day or as told by your health care provider. As your pain improves, do them once each day, but increase the number of times that you repeat the steps for each exercise (do more repetitions). To prevent the recurrence of back pain, continue to do these exercises once each day or as told by your health care  provider. Do exercises exactly as told by your health care provider and adjust them as directed. It is normal to feel mild stretching, pulling, tightness, or discomfort as you do these exercises, but you should stop right away if you feel sudden pain or your pain gets worse. Exercises Single knee to chest Repeat these steps 3-5 times for each leg: Lie on your back on a firm bed or the floor with your legs extended. Bring one knee to your chest. Your other leg should stay extended and in contact with the floor. Hold your knee in place by grabbing your knee or thigh with both hands and hold. Pull on your knee until you feel a gentle stretch in your lower back or buttocks. Hold the stretch for 10-30 seconds. Slowly release and straighten your leg.  Pelvic tilt Repeat these steps 5-10 times: Lie on your back on a firm bed or the floor with your legs extended. Bend your knees so they are pointing toward the ceiling and your feet are flat on the floor. Tighten your lower abdominal muscles to press your lower back against the floor. This motion will tilt your pelvis so your tailbone points up toward the ceiling instead of pointing to your feet or the floor. With gentle tension and even breathing, hold this position for 5-10 seconds.  Cat-cow Repeat these steps until your lower back becomes more flexible: Get into a hands-and-knees position on a firm bed or the floor. Keep your hands under your shoulders, and keep your knees under your hips. You may place padding under your knees for comfort. Let your head hang down toward your chest. Contract your abdominal muscles and point your tailbone toward the floor so your lower back becomes rounded like the back of a cat. Hold this position for 5 seconds. Slowly lift your head, let your abdominal muscles relax, and point your tailbone up toward the ceiling so your back forms a sagging arch like the back of a cow. Hold this position for 5  seconds.  Press-ups Repeat these steps 5-10 times: Lie on your abdomen (face-down) on a firm bed or the floor. Place your palms near your head, about shoulder-width apart. Keeping your back as relaxed as possible and keeping your hips on the floor, slowly straighten your arms to raise the top half of your body and lift your shoulders. Do not use your back muscles to raise your upper torso. You may adjust the placement of your hands to make yourself more comfortable. Hold this position for 5 seconds while you keep your back relaxed. Slowly return to lying flat on the floor.  Bridges Repeat these steps 10 times: Lie on your back on a firm bed or the floor. Bend your knees so they are pointing toward the ceiling and your feet are flat on the floor. Your arms should be flat at your sides, next to  your body. Tighten your buttocks muscles and lift your buttocks off the floor until your waist is at almost the same height as your knees. You should feel the muscles working in your buttocks and the back of your thighs. If you do not feel these muscles, slide your feet 1-2 inches (2.5-5 cm) farther away from your buttocks. Hold this position for 3-5 seconds. Slowly lower your hips to the starting position, and allow your buttocks muscles to relax completely. If this exercise is too easy, try doing it with your arms crossed over your chest. Abdominal crunches Repeat these steps 5-10 times: Lie on your back on a firm bed or the floor with your legs extended. Bend your knees so they are pointing toward the ceiling and your feet are flat on the floor. Cross your arms over your chest. Tip your chin slightly toward your chest without bending your neck. Tighten your abdominal muscles and slowly raise your torso high enough to lift your shoulder blades a tiny bit off the floor. Avoid raising your torso higher than that because it can put too much stress on your lower back and does not help to strengthen your  abdominal muscles. Slowly return to your starting position.  Back lifts Repeat these steps 5-10 times: Lie on your abdomen (face-down) with your arms at your sides, and rest your forehead on the floor. Tighten the muscles in your legs and your buttocks. Slowly lift your chest off the floor while you keep your hips pressed to the floor. Keep the back of your head in line with the curve in your back. Your eyes should be looking at the floor. Hold this position for 3-5 seconds. Slowly return to your starting position.  Contact a health care provider if: Your back pain or discomfort gets much worse when you do an exercise. Your worsening back pain or discomfort does not lessen within 2 hours after you exercise. If you have any of these problems, stop doing these exercises right away. Do not do them again unless your health care provider says that you can. Get help right away if: You develop sudden, severe back pain. If this happens, stop doing the exercises right away. Do not do them again unless your health care provider says that you can. This information is not intended to replace advice given to you by your health care provider. Make sure you discuss any questions you have with your health care provider. Document Revised: 01/04/2021 Document Reviewed: 09/22/2020 Elsevier Patient Education  [place].    If you have been instructed to have an in-person evaluation today at a local Urgent Care facility, please use the link below. It will take you to a list of all of our available [facility], including address, phone number and hours of operation. Please do not delay care.  [facility]  If you or a family member do not have a primary care provider, use the link below to schedule a visit and establish care. When you choose a [facility] primary care physician or advanced practice provider, you gain a long-term partner in health. Find a Primary Care  Provider  Learn more about [facility]'s in-office and virtual care options: [facility]

## 2022-09-28 ENCOUNTER — Encounter: Payer: Self-pay | Admitting: Cardiovascular Disease

## 2022-09-28 ENCOUNTER — Ambulatory Visit: Payer: 59 | Attending: Cardiovascular Disease | Admitting: Cardiovascular Disease

## 2022-09-28 VITALS — BP 166/98 | HR 96 | Ht 70.5 in | Wt 373.4 lb

## 2022-09-28 DIAGNOSIS — I5022 Chronic systolic (congestive) heart failure: Secondary | ICD-10-CM | POA: Diagnosis not present

## 2022-09-28 DIAGNOSIS — E785 Hyperlipidemia, unspecified: Secondary | ICD-10-CM

## 2022-09-28 DIAGNOSIS — I2510 Atherosclerotic heart disease of native coronary artery without angina pectoris: Secondary | ICD-10-CM

## 2022-09-28 DIAGNOSIS — I10 Essential (primary) hypertension: Secondary | ICD-10-CM | POA: Diagnosis not present

## 2022-09-28 DIAGNOSIS — I4729 Other ventricular tachycardia: Secondary | ICD-10-CM

## 2022-09-28 MED ORDER — SPIRONOLACTONE 25 MG PO TABS: 25.0000 mg | ORAL_TABLET | Freq: Every day | ORAL | 3 refills | Status: DC

## 2022-09-28 MED ORDER — CARVEDILOL 25 MG PO TABS: 25.0000 mg | ORAL_TABLET | Freq: Two times a day (BID) | ORAL | 3 refills | Status: DC

## 2022-09-28 MED ORDER — CLOPIDOGREL BISULFATE 75 MG PO TABS: 75.0000 mg | ORAL_TABLET | Freq: Every day | ORAL | 3 refills | Status: DC

## 2022-09-28 MED ORDER — ROSUVASTATIN CALCIUM 10 MG PO TABS: ORAL_TABLET | ORAL | 3 refills | Status: DC

## 2022-09-28 MED ORDER — LISINOPRIL 40 MG PO TABS: 40.0000 mg | ORAL_TABLET | Freq: Every day | ORAL | 3 refills | Status: DC

## 2022-09-28 NOTE — Patient Instructions (Signed)
Medication Instructions:  No changes *If you need a refill on your cardiac medications before your next appointment, please call your pharmacy*   Lab Work: None ordered If you have labs (blood work) drawn today and your tests are completely normal, you will receive your results only by: [facility] (if you have MyChart) OR A paper copy in the mail If you have any lab test that is abnormal or we need to change your treatment, we will call you to review the results.   Testing/Procedures: None ordered   Follow-Up: At [facility], you and your health needs are our priority.  As part of our continuing mission to provide you with exceptional heart care, we have created designated Provider Care Teams.  These Care Teams include your primary Cardiologist (physician) and Advanced Practice Providers (APPs -  Physician Assistants and Nurse Practitioners) who all work together to provide you with the care you need, when you need it.  We recommend signing up for the patient portal called "MyChart".  Sign up information is provided on this After Visit Summary.  MyChart is used to connect with patients for Virtual Visits (Telemedicine).  Patients are able to view lab/test results, encounter notes, upcoming appointments, etc.  Non-urgent messages can be sent to your provider as well.   To learn more about what you can do with MyChart, go to [email or web address].    Your next appointment:   6 month(s)  Provider:   You may see Kathlyn Sacramento, MD or one of the following Advanced Practice Providers on your designated Care Team:   Murray Hodgkins, NP Christell Faith, PA-C Cadence Kathlen Mody, PA-C Gerrie Nordmann, NP    Other Instructions Hold the Plavix 5 days before your dental procedure.

## 2022-09-28 NOTE — Progress Notes (Signed)
Cardiology Office Note   Date:  09/28/2022   ID:  Steve Burnett, DOB 1960-11-16, MRN [id number]  PCP:  Patient, No Pcp Per  Cardiologist:   Kathlyn Sacramento, MD   Chief Complaint  Patient presents with   Pre-op Exam    Cardiac clearance, questions about Family Hx      History of Present Illness: Steve Burnett is a 62 y.o. male who presents for a follow-up visit regarding coronary artery disease and mild ischemic cardiomyopathy. He has chronic medical conditions including morbid obesity, essential hypertension and hyperlipidemia. He had non-ST elevation myocardial infarction in August 2013.  He was found to have obstructive disease affecting the mid LAD which was treated with drug-eluting stent placement. More recently, he presented with an acute inferior ST elevation myocardial infarction in July [id number] complicated by ventricular fibrillation.  Cardiac catheterization showed occluded proximal RCA which was treated successfully with PCI and drug-eluting stent placement.  There was residual 70 to 75% in-stent restenosis in the proximal LAD. He subsequently underwent a [place] which showed no evidence of anterior wall ischemia. He did have increased PVCs post MI which improved after increasing carvedilol to 25 mg twice daily.  Most recent echocardiogram in August 2021 showed an EF of 45 to 50%, mild pulmonary hypertension and mild to moderate mitral regurgitation. Outpatient monitor in July 2021 showed sinus rhythm with 4 runs of symptomatic nonsustained ventricular tachycardia the longest lasted 16 seconds.  PVC burden was 1.3%.  From a cardiac standpoint, he has been doing reasonably well with no recent chest pain or worsening dyspnea. He is supposed to get dental extraction in the near future.  He also injured his back recently and developed lower back pain.  He has not been able to lose any weight.   Past Medical History:  Diagnosis Date   Anxiety    Chicken pox     Chronic combined systolic (congestive) and diastolic (congestive) heart failure ([place])    a. 01/2018 Echo: EF 35-40%; b. 04/2018 Echo: EF 45-50%, diff HK, Gr2 DD; c. 02/2020 Echo: EF 45-50%, glob HK, Gr1 DD.   Coronary artery disease    a. 02/2012 NSTEMI/Cath/PCI: pLAD 60%, mLAD 80%, dLAD 70, nonobs LCX/RCA --> DES to prox/mid LAD; b. inf STEMI 01/23/18: ost/proxLAD 75% at the prox edge of the previously placed stent, mLAD 75%, OM1 50%, OM2 50%, pRCA 100% s/p PCI/DES, rPLA 70%; c. 02/2018 MV: Ef 44%, apical inferior infarct. No ischemia.   Dilated aortic root ([place])    a. 02/2020 Echo: Ao root 3.9cm.   Hyperlipidemia    Hypertension    Ischemic cardiomyopathy    a. echo 7/19: EF 35-40%, HK of the inf wall, Gr1DD; b. 04/2018 Echo: EF 45-50%, diff HK, Gr2 DD, mildly dil Ao root/Asc Al. Mild MR. Mild BAE. Nl RV fxn; c. 02/2020 Echo: EF 45-50%, glob HK, Gr1 DD. RVSP ~ 35.51mHg. Ao root 3.9cm.   Obesity    OSA (obstructive sleep apnea) 03/27/2014   PAF (paroxysmal atrial fibrillation) [place]    [facility]. [facility] inferior STEMI; b. resolved with IV amiodarone; c. not placed on anticoagulation given brief episode, acute illness, and need for DAPT   PVC's (premature ventricular contractions)    a. 02/2018 Holter: Frq PVC's. No afib. Beta blocker titrated.   VF (ventricular fibrillation) ([place]    [facility]/p def[facility] x 2    Past Surgical History:  Procedure Laterality Date   CARDIAC CATHETERIZATION  03/12/2012   CORONARY ANGIOPLASTY  03/12/2012   s/p drug eluting stent to the mid LAD : 3.5 x 38 mm Xience drug-eluting stent. Post dilated to 4.0 in mid segment and 4.5 proximally.   CORONARY/GRAFT ACUTE MI REVASCULARIZATION N/A 01/23/2018   Procedure: Coronary/Graft Acute MI Revascularization;  Surgeon: Isaias Cowman, MD;  Location: [facility];  Service: Cardiovascular;  Laterality: N/A;   LEFT HEART CATH AND CORONARY ANGIOGRAPHY N/A 01/23/2018   Procedure:  LEFT HEART CATH AND CORONARY ANGIOGRAPHY;  Surgeon: Isaias Cowman, MD;  Location: [facility];  Service: Cardiovascular;  Laterality: N/A;     Current Outpatient Medications  Medication Sig Dispense Refill   aspirin 81 MG chewable tablet Chew 1 tablet (81 mg total) by mouth daily. 30 tablet 11   carvedilol (COREG) 25 MG tablet Take 1 tablet (25 mg total) by mouth 2 (two) times daily with a meal. 180 tablet 9   clopidogrel (PLAVIX) 75 MG tablet Take 1 tablet (75 mg total) by mouth daily. SCHEDULE OFFICE VISIT FOR FUTURE REFILLS. 2ND ATTEMPT 30 tablet 0   fluticasone (FLONASE) 50 MCG/ACT nasal spray Place 2 sprays into both nostrils daily. 16 g 0   hydrOXYzine (ATARAX) 25 MG tablet Take 1 tablet (25 mg total) by mouth 3 (three) times daily as needed for anxiety. 30 tablet 1   lidocaine (LIDODERM) 5 % Place 1 patch onto the skin every 12 (twelve) hours. Remove & Discard patch within 12 hours or as directed by MD 10 patch 1   lisinopril (ZESTRIL) 40 MG tablet TAKE 1 TABLET BY MOUTH EVERY DAY 90 tablet 3   methocarbamol (ROBAXIN) 500 MG tablet Take 1 tablet (500 mg total) by mouth every 8 (eight) hours as needed for muscle spasms. 30 tablet 1   methylPREDNISolone (MEDROL DOSEPAK) 4 MG TBPK tablet 6 day taper; take as directed on package instructions 21 tablet 0   spironolactone (ALDACTONE) 25 MG tablet Take 1 tablet (25 mg total) by mouth daily. 90 tablet 3   amoxicillin-clavulanate (AUGMENTIN) 875-125 MG tablet Take 1 tablet by mouth 2 (two) times daily. (Patient not taking: Reported on 09/28/2022) 14 tablet 0   citalopram (CELEXA) 10 MG tablet TAKE 1 TABLET BY MOUTH EVERY DAY (Patient not taking: Reported on 09/28/2022) 90 tablet 1   meclizine (ANTIVERT) 25 MG tablet Take 1 tablet (25 mg total) by mouth 3 (three) times daily as needed for dizziness. (Patient not taking: Reported on 09/28/2022) 30 tablet 0   rosuvastatin (CRESTOR) 10 MG tablet TAKE 1 TABLET BY MOUTH EVERY DAY AT 6PM (Patient  not taking: Reported on 09/28/2022) 90 tablet 3   No current facility-administered medications for this visit.    Allergies:   Flomax [tamsulosin], Atorvastatin, and Crestor [rosuvastatin calcium]    Social History:  The patient  reports that he has never smoked. He has never used smokeless tobacco. He reports current alcohol use of about 2.0 standard drinks of alcohol per week. He reports that he does not use drugs.   Family History:  The patient's family history includes Heart attack (age of onset: 36) in his mother; Heart disease in his mother; Heart disease (age of onset: 8) in his sister; Hyperlipidemia in his mother; Hypertension in his mother; Stroke in his father.    ROS:  Please see the history of present illness.   Otherwise, review of systems are positive for none.   All other systems are reviewed and negative.    PHYSICAL EXAM: VS:  BP (!) 166/98 (BP Location: Left Arm, Patient Position: Sitting, Cuff Size: Large)   Pulse 96   Ht 5' 10.5" (1.791 m)   Wt (!) 373 lb 6.4 oz (169.4 kg)   SpO2 96%   BMI 52.82 kg/m  , BMI Body mass index is 52.82 kg/m. GEN: Well nourished, well developed, in no acute distress  HEENT: normal  Neck: no JVD, carotid bruits, or masses Cardiac: RRR; no murmurs, rubs, or gallops,no edema  Respiratory:  clear to auscultation bilaterally, normal work of breathing GI: soft, nontender, nondistended, + BS MS: no deformity or atrophy  Skin: warm and dry, no rash Neuro:  Strength and sensation are intact Psych: euthymic mood, full affect   EKG:  EKG is ordered today. The ekg ordered today demonstrates normal sinus rhythm with no significant ST or T wave changes.   Recent Labs: 09/23/2022: ALT 24; BUN 18; Creatinine, Ser 0.84; Hemoglobin 14.1; Platelets 297; Potassium 3.8; Sodium 136    Lipid Panel    Component Value Date/Time   CHOL 141 04/08/2020 1451   CHOL 146 01/29/2020 1221   CHOL 167 03/11/2012 0645   TRIG 152.0 (H) 04/08/2020 1451    TRIG 98 03/11/2012 0645   HDL 34.60 (L) 04/08/2020 1451   HDL 35 (L) 01/29/2020 1221   HDL 41 03/11/2012 0645   CHOLHDL 4 04/08/2020 1451   VLDL 30.4 04/08/2020 1451   VLDL 20 03/11/2012 0645   LDLCALC 76 04/08/2020 1451   LDLCALC 85 01/29/2020 1221   LDLCALC 106 (H) 03/11/2012 0645   LDLDIRECT 79.0 08/08/2016 1540      Wt Readings from Last 3 Encounters:  09/28/22 (!) 373 lb 6.4 oz (169.4 kg)  09/23/22 (!) 382 lb 11.5 oz (173.6 kg)  06/22/21 (!) 370 lb (167.8 kg)          No data to display            ASSESSMENT AND PLAN:  1.  Coronary artery disease involving native coronary arteries without angina: He is doing reasonably well with no anginal symptoms.  Continue lifelong dual antiplatelet therapy as tolerated.  However, he can hold clopidogrel 5 days before dental extractions.    2. Hyperlipidemia: Unfortunately, he stopped taking rosuvastatin.  He was not sure if it caused myalgia on but his symptoms did not improved after he stopped the medication.  Given his extensive cardiac history, I recommended that he resumes rosuvastatin 10 mg daily.  3. Essential hypertension: His blood pressure is elevated but he ran out of spironolactone few months ago.  All his medications were refilled today.  I discussed the importance of compliance with medications.  4.  Chronic systolic heart failure due to ischemic cardiomyopathy: Most recent ejection fraction improved to 45 to 50%.  Continue treatment with lisinopril, carvedilol and spironolactone.  5.  Morbid obesity: I discussed with him the importance of healthy diet and regular exercise.    6.  Preop cardiovascular evaluation for dental extraction: He has no anginal symptoms.  He does not require further ischemic cardiac evaluation and can proceed at an overall low risk.  Hold Plavix 5 days before.  7.  Nonsustained ventricular tachycardia: Well-controlled with carvedilol.    Disposition:   FU with me in 6  months  Signed,  Kathlyn Sacramento, MD  09/28/2022 4:15 PM    [facility]

## 2022-10-02 ENCOUNTER — Telehealth: Payer: 59 | Admitting: Nurse Practitioner

## 2022-10-02 DIAGNOSIS — G8929 Other chronic pain: Secondary | ICD-10-CM

## 2022-10-02 DIAGNOSIS — M545 Low back pain, unspecified: Secondary | ICD-10-CM

## 2022-10-02 NOTE — Progress Notes (Signed)
Virtual Visit Consent   Steve Burnett, you are scheduled for a virtual visit with a [facility] provider today. Just as with appointments in the office, your consent must be obtained to participate. Your consent will be active for this visit and any virtual visit you may have with one of our providers in the next 365 days. If you have a MyChart account, a copy of this consent can be sent to you electronically.  As this is a virtual visit, video technology does not allow for your provider to perform a traditional examination. This may limit your provider's ability to fully assess your condition. If your provider identifies any concerns that need to be evaluated in person or the need to arrange testing (such as labs, EKG, etc.), we will make arrangements to do so. Although advances in technology are sophisticated, we cannot ensure that it will always work on either your end or our end. If the connection with a video visit is poor, the visit may have to be switched to a telephone visit. With either a video or telephone visit, we are not always able to ensure that we have a secure connection.  By engaging in this virtual visit, you consent to the provision of healthcare and authorize for your insurance to be billed (if applicable) for the services provided during this visit. Depending on your insurance coverage, you may receive a charge related to this service.  I need to obtain your verbal consent now. Are you willing to proceed with your visit today? Steve Burnett has provided verbal consent on 10/02/2022 for a virtual visit (video or telephone). [name], FNP  Date: 10/02/2022 3:21 PM  Virtual Visit via Video Note   I, [name], connected with  Steve Burnett  ([id number], 23-Feb-1961) on 10/02/22 at  3:30 PM EDT by a video-enabled telemedicine application and verified that I am speaking with the correct person using two identifiers.  Location: Patient: Virtual Visit Location Patient:  Home Provider: Virtual Visit Location Provider: Home Office   I discussed the limitations of evaluation and management by telemedicine and the availability of in person appointments. The patient expressed understanding and agreed to proceed.    History of Present Illness: Steve Burnett is a 62 y.o. who identifies as a male who was assigned male at birth, and is being seen today for back pain follow up.  This has been a recurrent issue over the past few years   He was seen initially at [facility] ED on 09/23/22 was given Robaxin, Lidocaine patches and hydroxyzine.   He has a follow up visit 09/28/22 over VV and was given a Medrol Dosepak without any improvement   He has also been seen at [facility] for repeat oral steroids and did have some improvement after 3 days on that round   Today he feels his pain is back to where it was when he was in the ER  He did have an Xray at [facility]  Has not had an MRI   History of L1-L5 arthritis per patient   Had lumbar injections sometime in [id number]  Problems:  Patient Active Problem List   Diagnosis Date Noted   Type 2 diabetes mellitus with hyperglycemia, without long-term current use of insulin ([place]) 04/21/2020   BPH (benign prostatic hyperplasia) 04/09/2020   Dyshidrotic hand dermatitis 04/09/2020   Ischemic cardiomyopathy 01/25/2018   STEMI involving oth coronary artery of inferior wall ([place]) 01/23/2018   OSA (obstructive sleep apnea) 03/27/2014   Anxiety 07/31/2012  Hyperlipidemia    Essential hypertension    Coronary artery disease 03/12/2012    Allergies:  Allergies  Allergen Reactions   Flomax [Tamsulosin] Nausea Only    Upset, lightheaded   Atorvastatin Other (See Comments)    Muscle cramps    Crestor [Rosuvastatin Calcium] Other (See Comments)    Cramps   Medications:  Current Outpatient Medications:    amoxicillin-clavulanate (AUGMENTIN) 875-125 MG tablet, Take 1 tablet by mouth 2 (two) times daily. (Patient not taking:  Reported on 09/28/2022), Disp: 14 tablet, Rfl: 0   aspirin 81 MG chewable tablet, Chew 1 tablet (81 mg total) by mouth daily., Disp: 30 tablet, Rfl: 11   carvedilol (COREG) 25 MG tablet, Take 1 tablet (25 mg total) by mouth 2 (two) times daily with a meal., Disp: 180 tablet, Rfl: 3   citalopram (CELEXA) 10 MG tablet, TAKE 1 TABLET BY MOUTH EVERY DAY (Patient not taking: Reported on 09/28/2022), Disp: 90 tablet, Rfl: 1   clopidogrel (PLAVIX) 75 MG tablet, Take 1 tablet (75 mg total) by mouth daily., Disp: 90 tablet, Rfl: 3   fluticasone (FLONASE) 50 MCG/ACT nasal spray, Place 2 sprays into both nostrils daily., Disp: 16 g, Rfl: 0   hydrOXYzine (ATARAX) 25 MG tablet, Take 1 tablet (25 mg total) by mouth 3 (three) times daily as needed for anxiety., Disp: 30 tablet, Rfl: 1   lidocaine (LIDODERM) 5 %, Place 1 patch onto the skin every 12 (twelve) hours. Remove & Discard patch within 12 hours or as directed by MD, Disp: 10 patch, Rfl: 1   lisinopril (ZESTRIL) 40 MG tablet, Take 1 tablet (40 mg total) by mouth daily., Disp: 90 tablet, Rfl: 3   meclizine (ANTIVERT) 25 MG tablet, Take 1 tablet (25 mg total) by mouth 3 (three) times daily as needed for dizziness. (Patient not taking: Reported on 09/28/2022), Disp: 30 tablet, Rfl: 0   methocarbamol (ROBAXIN) 500 MG tablet, Take 1 tablet (500 mg total) by mouth every 8 (eight) hours as needed for muscle spasms., Disp: 30 tablet, Rfl: 1   methylPREDNISolone (MEDROL DOSEPAK) 4 MG TBPK tablet, 6 day taper; take as directed on package instructions, Disp: 21 tablet, Rfl: 0   rosuvastatin (CRESTOR) 10 MG tablet, TAKE 1 TABLET BY MOUTH EVERY DAY AT 6PM, Disp: 90 tablet, Rfl: 3   spironolactone (ALDACTONE) 25 MG tablet, Take 1 tablet (25 mg total) by mouth daily., Disp: 90 tablet, Rfl: 3  Observations/Objective: Patient is well-developed, well-nourished in no acute distress.  Resting comfortably  at home.  Head is normocephalic, atraumatic.  No labored breathing.  Speech  is clear and coherent with logical content.  Patient is alert and oriented at baseline.    Assessment and Plan: 1. Chronic midline low back pain without sciatica Discussed with patient he will need to go back to ED or [facility] for additional workup We cannot provider any stronger medication or further treatments for this condition over virtual visits at this time     Follow Up Instructions: I discussed the assessment and treatment plan with the patient. The patient was provided an opportunity to ask questions and all were answered. The patient agreed with the plan and demonstrated an understanding of the instructions.  A copy of instructions were sent to the patient via MyChart unless otherwise noted below.    The patient was advised to call back or seek an in-person evaluation if the symptoms worsen or if the condition fails to improve as anticipated.  Time:  I  spent 15 minutes with the patient via telehealth technology discussing the above problems/concerns.    [name], FNP

## 2022-10-04 ENCOUNTER — Other Ambulatory Visit: Payer: Self-pay

## 2022-10-04 ENCOUNTER — Inpatient Hospital Stay
Admission: EM | Admit: 2022-10-04 | Discharge: 2022-10-10 | DRG: 552 | Disposition: A | Discharge status: Home Health | Payer: 59 | Attending: Student | Admitting: Student

## 2022-10-04 ENCOUNTER — Emergency Department: Payer: 59

## 2022-10-04 ENCOUNTER — Encounter: Payer: Self-pay | Admitting: Intensive Care

## 2022-10-04 DIAGNOSIS — M5416 Radiculopathy, lumbar region: Secondary | ICD-10-CM | POA: Diagnosis present

## 2022-10-04 DIAGNOSIS — X500XXA Overexertion from strenuous movement or load, initial encounter: Secondary | ICD-10-CM

## 2022-10-04 DIAGNOSIS — I110 Hypertensive heart disease with heart failure: Secondary | ICD-10-CM | POA: Diagnosis present

## 2022-10-04 DIAGNOSIS — S33121A Dislocation of L2/L3 lumbar vertebra, initial encounter: Secondary | ICD-10-CM | POA: Diagnosis present

## 2022-10-04 DIAGNOSIS — I255 Ischemic cardiomyopathy: Secondary | ICD-10-CM | POA: Diagnosis present

## 2022-10-04 DIAGNOSIS — I2510 Atherosclerotic heart disease of native coronary artery without angina pectoris: Secondary | ICD-10-CM | POA: Diagnosis present

## 2022-10-04 DIAGNOSIS — I252 Old myocardial infarction: Secondary | ICD-10-CM

## 2022-10-04 DIAGNOSIS — M5126 Other intervertebral disc displacement, lumbar region: Secondary | ICD-10-CM

## 2022-10-04 DIAGNOSIS — Y990 Civilian activity done for income or pay: Secondary | ICD-10-CM

## 2022-10-04 DIAGNOSIS — Z955 Presence of coronary angioplasty implant and graft: Secondary | ICD-10-CM

## 2022-10-04 DIAGNOSIS — I10 Essential (primary) hypertension: Secondary | ICD-10-CM | POA: Diagnosis present

## 2022-10-04 DIAGNOSIS — M48061 Spinal stenosis, lumbar region without neurogenic claudication: Secondary | ICD-10-CM | POA: Diagnosis not present

## 2022-10-04 DIAGNOSIS — M549 Dorsalgia, unspecified: Secondary | ICD-10-CM | POA: Diagnosis not present

## 2022-10-04 DIAGNOSIS — I480 Paroxysmal atrial fibrillation: Secondary | ICD-10-CM | POA: Diagnosis present

## 2022-10-04 DIAGNOSIS — E785 Hyperlipidemia, unspecified: Secondary | ICD-10-CM | POA: Diagnosis present

## 2022-10-04 DIAGNOSIS — I77810 Thoracic aortic ectasia: Secondary | ICD-10-CM | POA: Diagnosis present

## 2022-10-04 DIAGNOSIS — E1165 Type 2 diabetes mellitus with hyperglycemia: Secondary | ICD-10-CM | POA: Diagnosis not present

## 2022-10-04 DIAGNOSIS — N200 Calculus of kidney: Secondary | ICD-10-CM | POA: Diagnosis present

## 2022-10-04 DIAGNOSIS — Z8249 Family history of ischemic heart disease and other diseases of the circulatory system: Secondary | ICD-10-CM

## 2022-10-04 DIAGNOSIS — M5116 Intervertebral disc disorders with radiculopathy, lumbar region: Secondary | ICD-10-CM | POA: Diagnosis present

## 2022-10-04 DIAGNOSIS — T380X5A Adverse effect of glucocorticoids and synthetic analogues, initial encounter: Secondary | ICD-10-CM | POA: Diagnosis present

## 2022-10-04 DIAGNOSIS — Z6841 Body Mass Index (BMI) 40.0 and over, adult: Secondary | ICD-10-CM

## 2022-10-04 DIAGNOSIS — G4733 Obstructive sleep apnea (adult) (pediatric): Secondary | ICD-10-CM | POA: Diagnosis present

## 2022-10-04 DIAGNOSIS — M48062 Spinal stenosis, lumbar region with neurogenic claudication: Secondary | ICD-10-CM | POA: Diagnosis present

## 2022-10-04 DIAGNOSIS — Z7902 Long term (current) use of antithrombotics/antiplatelets: Secondary | ICD-10-CM

## 2022-10-04 DIAGNOSIS — D72829 Elevated white blood cell count, unspecified: Secondary | ICD-10-CM | POA: Diagnosis present

## 2022-10-04 DIAGNOSIS — I5042 Chronic combined systolic (congestive) and diastolic (congestive) heart failure: Secondary | ICD-10-CM | POA: Diagnosis present

## 2022-10-04 DIAGNOSIS — Z823 Family history of stroke: Secondary | ICD-10-CM

## 2022-10-04 DIAGNOSIS — Z83438 Family history of other disorder of lipoprotein metabolism and other lipidemia: Secondary | ICD-10-CM

## 2022-10-04 DIAGNOSIS — Z79899 Other long term (current) drug therapy: Secondary | ICD-10-CM

## 2022-10-04 DIAGNOSIS — R262 Difficulty in walking, not elsewhere classified: Secondary | ICD-10-CM | POA: Diagnosis present

## 2022-10-04 DIAGNOSIS — Y9289 Other specified places as the place of occurrence of the external cause: Secondary | ICD-10-CM

## 2022-10-04 LAB — CBC WITH DIFFERENTIAL/PLATELET
Abs Immature Granulocytes: 0.12 10*3/uL — ABNORMAL HIGH (ref 0.00–0.07)
Basophils Absolute: 0 10*3/uL (ref 0.0–0.1)
Basophils Relative: 0 %
Eosinophils Absolute: 0 10*3/uL (ref 0.0–0.5)
Eosinophils Relative: 0 %
HCT: 51.5 % (ref 39.0–52.0)
Hemoglobin: 16.7 g/dL (ref 13.0–17.0)
Immature Granulocytes: 1 %
Lymphocytes Relative: 10 %
Lymphs Abs: 1.5 10*3/uL (ref 0.7–4.0)
MCH: 25.3 pg — ABNORMAL LOW (ref 26.0–34.0)
MCHC: 32.4 g/dL (ref 30.0–36.0)
MCV: 78 fL — ABNORMAL LOW (ref 80.0–100.0)
Monocytes Absolute: 0.7 10*3/uL (ref 0.1–1.0)
Monocytes Relative: 5 %
Neutro Abs: 13.3 10*3/uL — ABNORMAL HIGH (ref 1.7–7.7)
Neutrophils Relative %: 84 %
Platelets: 401 10*3/uL — ABNORMAL HIGH (ref 150–400)
RBC: 6.6 MIL/uL — ABNORMAL HIGH (ref 4.22–5.81)
RDW: 18 % — ABNORMAL HIGH (ref 11.5–15.5)
WBC: 15.6 10*3/uL — ABNORMAL HIGH (ref 4.0–10.5)
nRBC: 0 % (ref 0.0–0.2)

## 2022-10-04 LAB — COMPREHENSIVE METABOLIC PANEL
ALT: 30 U/L (ref 0–44)
AST: 23 U/L (ref 15–41)
Albumin: 3.6 g/dL (ref 3.5–5.0)
Alkaline Phosphatase: 65 U/L (ref 38–126)
Anion gap: 11 (ref 5–15)
BUN: 30 mg/dL — ABNORMAL HIGH (ref 6–20)
CO2: 20 mmol/L — ABNORMAL LOW (ref 22–32)
Calcium: 8.9 mg/dL (ref 8.9–10.3)
Chloride: 100 mmol/L (ref 98–111)
Creatinine, Ser: 0.92 mg/dL (ref 0.61–1.24)
GFR, Estimated: >60 mL/min (ref >60)
Glucose, Bld: 220 mg/dL — ABNORMAL HIGH (ref 70–99)
Potassium: 4 mmol/L (ref 3.5–5.1)
Sodium: 131 mmol/L — ABNORMAL LOW (ref 135–145)
Total Bilirubin: 0.8 mg/dL (ref 0.3–1.2)
Total Protein: 7.9 g/dL (ref 6.5–8.1)

## 2022-10-04 LAB — URINALYSIS, ROUTINE W REFLEX MICROSCOPIC
Bilirubin Urine: NEGATIVE
Glucose, UA: NEGATIVE mg/dL
Hgb urine dipstick: NEGATIVE
Ketones, ur: NEGATIVE mg/dL
Leukocytes,Ua: NEGATIVE
Nitrite: NEGATIVE
Protein, ur: NEGATIVE mg/dL
Specific Gravity, Urine: >1.046 — ABNORMAL HIGH (ref 1.005–1.030)
pH: 5 (ref 5.0–8.0)

## 2022-10-04 LAB — CHLAMYDIA/NGC RT PCR (ARMC ONLY)
Chlamydia Tr: NOT DETECTED
N gonorrhoeae: NOT DETECTED

## 2022-10-04 LAB — CK: Total CK: 61 U/L (ref 49–397)

## 2022-10-04 LAB — GLUCOSE, CAPILLARY: Glucose-Capillary: 307 mg/dL — ABNORMAL HIGH (ref 70–99)

## 2022-10-04 MED ORDER — LISINOPRIL 10 MG PO TABS
40.0000 mg | ORAL_TABLET | Freq: Every day | ORAL | Status: DC
  Administered 2022-10-05 – 2022-10-10 (×6): 40 mg via ORAL
  Filled 2022-10-04 (×6): qty 4

## 2022-10-04 MED ORDER — POLYETHYLENE GLYCOL 3350 17 G PO PACK
17.0000 g | PACK | Freq: Every day | ORAL | Status: DC | PRN
  Administered 2022-10-05: 17 g via ORAL
  Filled 2022-10-04: qty 1

## 2022-10-04 MED ORDER — SODIUM CHLORIDE 0.9 % IV BOLUS
1000.0000 mL | Freq: Once | INTRAVENOUS | Status: AC
  Administered 2022-10-04: 1000 mL via INTRAVENOUS

## 2022-10-04 MED ORDER — ONDANSETRON HCL 4 MG/2ML IJ SOLN
4.0000 mg | Freq: Once | INTRAMUSCULAR | Status: AC
  Administered 2022-10-04: 4 mg via INTRAVENOUS
  Filled 2022-10-04: qty 2

## 2022-10-04 MED ORDER — HYDROXYZINE HCL 25 MG PO TABS
25.0000 mg | ORAL_TABLET | Freq: Three times a day (TID) | ORAL | Status: DC | PRN
  Administered 2022-10-05 – 2022-10-08 (×3): 25 mg via ORAL
  Filled 2022-10-04 (×5): qty 1

## 2022-10-04 MED ORDER — ASPIRIN 81 MG PO TBEC
81.0000 mg | DELAYED_RELEASE_TABLET | Freq: Every day | ORAL | Status: DC
  Administered 2022-10-04 – 2022-10-10 (×7): 81 mg via ORAL
  Filled 2022-10-04 (×7): qty 1

## 2022-10-04 MED ORDER — HYDROMORPHONE HCL 1 MG/ML IJ SOLN: 1.0000 mg | INTRAMUSCULAR | Status: DC | PRN

## 2022-10-04 MED ORDER — ENOXAPARIN SODIUM 100 MG/ML IJ SOSY
85.0000 mg | PREFILLED_SYRINGE | INTRAMUSCULAR | Status: DC
  Administered 2022-10-06 – 2022-10-09 (×4): 85 mg via SUBCUTANEOUS
  Filled 2022-10-04 (×6): qty 0.85

## 2022-10-04 MED ORDER — ONDANSETRON HCL 4 MG/2ML IJ SOLN: 4.0000 mg | Freq: Four times a day (QID) | INTRAMUSCULAR | Status: DC | PRN

## 2022-10-04 MED ORDER — PREDNISONE 50 MG PO TABS
60.0000 mg | ORAL_TABLET | Freq: Every day | ORAL | Status: DC
  Administered 2022-10-05 – 2022-10-10 (×6): 60 mg via ORAL
  Filled 2022-10-04 (×6): qty 1

## 2022-10-04 MED ORDER — METHOCARBAMOL 500 MG PO TABS
500.0000 mg | ORAL_TABLET | Freq: Three times a day (TID) | ORAL | Status: DC | PRN
  Administered 2022-10-06 (×2): 500 mg via ORAL
  Filled 2022-10-04 (×2): qty 1

## 2022-10-04 MED ORDER — ONDANSETRON HCL 4 MG PO TABS: 4.0000 mg | ORAL_TABLET | Freq: Four times a day (QID) | ORAL | Status: DC | PRN

## 2022-10-04 MED ORDER — SPIRONOLACTONE 25 MG PO TABS
25.0000 mg | ORAL_TABLET | Freq: Every day | ORAL | Status: DC
  Administered 2022-10-05 – 2022-10-10 (×6): 25 mg via ORAL
  Filled 2022-10-04 (×6): qty 1

## 2022-10-04 MED ORDER — DEXAMETHASONE SODIUM PHOSPHATE 10 MG/ML IJ SOLN
10.0000 mg | Freq: Once | INTRAMUSCULAR | Status: AC
  Administered 2022-10-04: 10 mg via INTRAVENOUS
  Filled 2022-10-04: qty 1

## 2022-10-04 MED ORDER — CARVEDILOL 12.5 MG PO TABS
25.0000 mg | ORAL_TABLET | Freq: Two times a day (BID) | ORAL | Status: DC
  Administered 2022-10-04 – 2022-10-10 (×12): 25 mg via ORAL
  Filled 2022-10-04 (×12): qty 2

## 2022-10-04 MED ORDER — IBUPROFEN 800 MG PO TABS
800.0000 mg | ORAL_TABLET | Freq: Three times a day (TID) | ORAL | Status: DC
  Administered 2022-10-04 – 2022-10-05 (×2): 800 mg via ORAL
  Filled 2022-10-04 (×3): qty 1

## 2022-10-04 MED ORDER — HYDROMORPHONE HCL 1 MG/ML IJ SOLN
1.0000 mg | INTRAMUSCULAR | Status: DC | PRN
  Administered 2022-10-04 – 2022-10-07 (×12): 1 mg via INTRAVENOUS
  Filled 2022-10-04 (×12): qty 1

## 2022-10-04 MED ORDER — ACETAMINOPHEN 500 MG PO TABS
1000.0000 mg | ORAL_TABLET | Freq: Three times a day (TID) | ORAL | Status: DC
  Administered 2022-10-04 – 2022-10-10 (×17): 1000 mg via ORAL
  Filled 2022-10-04 (×17): qty 2

## 2022-10-04 MED ORDER — HYDROMORPHONE HCL 1 MG/ML IJ SOLN
1.0000 mg | Freq: Once | INTRAMUSCULAR | Status: AC
  Administered 2022-10-04: 1 mg via INTRAVENOUS
  Filled 2022-10-04: qty 1

## 2022-10-04 MED ORDER — INSULIN ASPART 100 UNIT/ML IJ SOLN
0.0000 [IU] | Freq: Three times a day (TID) | INTRAMUSCULAR | Status: DC
  Administered 2022-10-05: 5 [IU] via SUBCUTANEOUS
  Administered 2022-10-05: 3 [IU] via SUBCUTANEOUS
  Administered 2022-10-05: 5 [IU] via SUBCUTANEOUS
  Administered 2022-10-06: 2 [IU] via SUBCUTANEOUS
  Administered 2022-10-06: 3 [IU] via SUBCUTANEOUS
  Administered 2022-10-06: 5 [IU] via SUBCUTANEOUS
  Administered 2022-10-07 (×2): 3 [IU] via SUBCUTANEOUS
  Administered 2022-10-07: 2 [IU] via SUBCUTANEOUS
  Administered 2022-10-08: 5 [IU] via SUBCUTANEOUS
  Administered 2022-10-08 – 2022-10-09 (×2): 3 [IU] via SUBCUTANEOUS
  Administered 2022-10-09: 5 [IU] via SUBCUTANEOUS
  Administered 2022-10-10: 2 [IU] via SUBCUTANEOUS
  Filled 2022-10-04 (×13): qty 1

## 2022-10-04 MED ORDER — ROSUVASTATIN CALCIUM 20 MG PO TABS
10.0000 mg | ORAL_TABLET | Freq: Every day | ORAL | Status: DC
  Administered 2022-10-05 – 2022-10-10 (×6): 10 mg via ORAL
  Filled 2022-10-04 (×6): qty 0.5

## 2022-10-04 MED ORDER — IOHEXOL 350 MG/ML SOLN
100.0000 mL | Freq: Once | INTRAVENOUS | Status: AC | PRN
  Administered 2022-10-04: 100 mL via INTRAVENOUS

## 2022-10-04 NOTE — Consult Note (Signed)
Consult requested by:  Dr. Jari Pigg  Consult requested for:  Lumbar disc herniation  Primary Physician:  Patient, No Pcp Per  History of Present Illness: 10/04/2022 Mr. Navon Obie is here today with a chief complaint of leg pain and difficulty walking.  He has had worsening issues over the past several weeks.  He is having some problems with numbness in his left leg.  He is able to urinate and control his bowel movements, but he is having some difficulty with standing due to pain and instability.  He came into the hospital today because he has had trouble ambulating.  He has been on multiple steroid tapers.  Bowel/Bladder Dysfunction: none  Conservative measures:  Physical therapy: none  Multimodal medical therapy including regular antiinflammatories: multiple steroid tapers  Injections: has had epidural steroid injections in the past  Past Surgery: none  The symptoms are causing a significant impact on the patient's life.   I have utilized the care everywhere function in epic to review the outside records available from external health systems.  Review of Systems:  A 10 point review of systems is negative, except for the pertinent positives and negatives detailed in the HPI.  Past Medical History: Past Medical History:  Diagnosis Date   Anxiety    Chicken pox    Chronic combined systolic (congestive) and diastolic (congestive) heart failure ([place])    a. 01/2018 Echo: EF 35-40%; b. 04/2018 Echo: EF 45-50%, diff HK, Gr2 DD; c. 02/2020 Echo: EF 45-50%, glob HK, Gr1 DD.   Coronary artery disease    a. 02/2012 NSTEMI/Cath/PCI: pLAD 60%, mLAD 80%, dLAD 70, nonobs LCX/RCA --> DES to prox/mid LAD; b. inf STEMI 01/23/18: ost/proxLAD 75% at the prox edge of the previously placed stent, mLAD 75%, OM1 50%, OM2 50%, pRCA 100% s/p PCI/DES, rPLA 70%; c. 02/2018 MV: Ef 44%, apical inferior infarct. No ischemia.   Dilated aortic root ([place])    a. 02/2020 Echo: Ao root 3.9cm.   Hyperlipidemia     Hypertension    Ischemic cardiomyopathy    a. echo 7/19: EF 35-40%, HK of the inf wall, Gr1DD; b. 04/2018 Echo: EF 45-50%, diff HK, Gr2 DD, mildly dil Ao root/Asc Al. Mild MR. Mild BAE. Nl RV fxn; c. 02/2020 Echo: EF 45-50%, glob HK, Gr1 DD. RVSP ~ 35.77mHg. Ao root 3.9cm.   Obesity    OSA (obstructive sleep apnea) 03/27/2014   PAF ([place]oxysmal [place] fibrillation) [place]    [facility]; b. resolved with IV amiodarone; c. not placed on anticoagulation given brief episode, acute illness, and need for DAPT   PVC's (premature ventricular contractions)    a. 02/2018 Holter: Frq PVC's. No afib. Beta blocker titrated.   VF ([place]ricular fibrillation) ([place]    [facility] s/p def[facility] x 2    Past Surgical History: Past Surgical History:  Procedure Laterality Date   CARDIAC CATHETERIZATION  03/12/2012   CORONARY ANGIOPLASTY  03/12/2012   s/p drug eluting stent to the mid LAD : 3.5 x 38 mm Xience drug-eluting stent. Post dilated to 4.0 in mid segment and 4.5 proximally.   CORONARY/GRAFT ACUTE MI REVASCULARIZATION N/A 01/23/2018   Procedure: Coronary/Graft Acute MI Revascularization;  Surgeon: PIsaias Cowman MD;  [facility];  Service: Cardiovascular;  Laterality: N/A;   LEFT HEART CATH AND CORONARY ANGIOGRAPHY N/A 01/23/2018   Procedure: LEFT HEART CATH AND CORONARY ANGIOGRAPHY;  Surgeon: PIsaias Cowman MD;  [facility];  Service: Cardiovascular;  Laterality: N/A;    Allergies: Allergies as of 10/04/2022 - Review Complete 10/04/2022  Allergen Reaction Noted   Flomax [tamsulosin] Nausea Only 07/21/2020   Atorvastatin Other (See Comments) 04/22/2013   Crestor [rosuvastatin calcium] Other (See Comments) 06/14/2018    Medications: Current Meds  Medication Sig   aspirin 81 MG chewable tablet Chew 1 tablet (81 mg total) by mouth daily.   carvedilol (COREG) 25 MG tablet Take 1 tablet (25 mg total)  by mouth 2 (two) times daily with a meal.   clopidogrel (PLAVIX) 75 MG tablet Take 1 tablet (75 mg total) by mouth daily.   fluticasone (FLONASE) 50 MCG/ACT nasal spray Place 2 sprays into both nostrils daily.   hydrOXYzine (ATARAX) 25 MG tablet Take 1 tablet (25 mg total) by mouth 3 (three) times daily as needed for anxiety.   lidocaine (LIDODERM) 5 % Place 1 patch onto the skin every 12 (twelve) hours. Remove & Discard patch within 12 hours or as directed by MD   lisinopril (ZESTRIL) 40 MG tablet Take 1 tablet (40 mg total) by mouth daily.   methocarbamol (ROBAXIN) 500 MG tablet Take 1 tablet (500 mg total) by mouth every 8 (eight) hours as needed for muscle spasms.   rosuvastatin (CRESTOR) 10 MG tablet TAKE 1 TABLET BY MOUTH EVERY DAY AT 6PM   spironolactone (ALDACTONE) 25 MG tablet Take 1 tablet (25 mg total) by mouth daily.    Social History: Social History   Tobacco Use   Smoking status: Never   Smokeless tobacco: Never   Tobacco comments:    Never used tobacco products  Vaping Use   Vaping Use: Never used  Substance Use Topics   Alcohol use: Yes    Alcohol/week: 1.0 standard drink of alcohol    Types: 1 Cans of beer per week   Drug use: Not Currently    Types: Marijuana    Family Medical History: Family History  Problem Relation Age of Onset   Heart disease Mother    Hyperlipidemia Mother    Hypertension Mother    Heart attack Mother 71       CABG X 4; past at age 56   Heart disease Sister 36       s/p stents    Stroke Father    Cancer Neg Hx    Diabetes Neg Hx     Physical Examination: Vitals:   10/04/22 1800 10/04/22 1814  BP: (!) 145/95   Pulse: 82   Resp: 18   Temp:  98.4 F (36.9 C)  SpO2: 92%     General: Patient is obese, well nourished, calm, collected, and in no apparent distress. Attention to examination is appropriate.  Neck:   Supple.  Full range of motion.  Respiratory: Patient is breathing without any difficulty.   NEUROLOGICAL:      Awake, alert, oriented to person, place, and time.  Speech is clear and fluent.  Cranial Nerves: Pupils equal round and reactive to light.  Facial tone is symmetric.  Facial sensation is symmetric. Shoulder shrug is symmetric. Tongue protrusion is midline.  There is no pronator drift.  ROM of spine: full.    Strength: Side Biceps Triceps Deltoid Interossei Grip Wrist Ext. Wrist Flex.  R '5 5 5 5 5 5 5  '$ L '5 5 5 5 5 5 5   '$ Side Iliopsoas Quads Hamstring PF DF EHL  R '5 5 5 5 5 5  '$ L 5 5 5  $'5 5 5   'Z$ Reflexes are 1+ and symmetric at the biceps, triceps, brachioradialis, patella and achilles.   Hoffman's is absent.   Bilateral upper and lower extremity sensation is intact to light touch.    No evidence of dysmetria noted.  Gait is untested.    SLR positive on Left.  Medical Decision Making  Imaging: MRI L spine 10/04/22   IMPRESSION: 1. New large left central disc extrusion at L2-3 with 2.7 cm of caudal migration to the L3 inferior endplate resulting in severe spinal canal stenosis. Additionally, there is irregularity of the left lateral margin of the dura with adjacent intrathecal signal abnormality and possible herniation of epidural fat, raising the possibility of an intrathecal disc fragment. 2. Otherwise unchanged lumbar spondylosis, worst at L3-L4, where there is moderate spinal canal stenosis and moderate left neural foraminal narrowing.     Electronically Signed   By: Emmit Alexanders M.D.   On: 10/04/2022 12:39  I have personally reviewed the images and agree with the above interpretation.  Assessment and Plan: Mr. Dehner is a pleasant 62 y.o. male with worsening L2-3 disc herniation.  It is eccentric to the left side and causes severe compression of the thecal sac at that level.  He clinically is suffering from lumbar radiculopathy without red flag symptoms.  He does not have cauda equina syndrome or weakness currently.  There is currently not an indication for  urgent surgery.  He would be at high risk for surgical complications due to his history of Plavix use which was last utilized on Tuesday.  Additionally, he is morbidly obese and would be at heightened risk of perioperative complications or reherniation.  We discussed this in detail.  If he is unable to achieve walking after several days of pain control, we may have to consider discectomy.  He would need to wait until he is off Plavix for at least 5 days.  He may consider epidural steroid injection, but that would then necessitate waiting at least 6 weeks to consider surgical intervention.  If he is able to be discharged due to improvement in pain, I would normally wait to consider surgical intervention until his BMI is under 40.  That would mean achieving a goal weight of around 285 pounds.  He did report that he has been able to do this in the past, but has gained weight over the past months during the winter.  He is being admitted to the medicine service for pain control and working with physical therapy and Occupational Therapy.  He may be a candidate for epidural steroid injection.  I did discuss that with him, as proceeding with an epidural steroid injection would then necessitate waiting for at least 8 weeks for any consideration of discectomy due to the elevated risk of discitis.  If he is unable to achieve ambulation, he may ultimately need to have his discectomy while he is inpatient.  If that is the case, we will wait until his Plavix is cleared.  I have discussed with Dr. Charleen Kirks but regarding physical therapy, NSAIDs, pain control, and consideration of epidural steroid injection.  Will continue to follow.    Amiera Herzberg K. Izora Ribas MD, [facility]

## 2022-10-04 NOTE — ED Triage Notes (Signed)
Patient arrived by EMS from home. C/o back pain X3 weeks that has progressively gotten worse. Reports he cannot get up/walk or take care of himself the last three days.   Also c/o back and groin pain that radiates down left leg. History of sciatica pain.   Denies injury.  History of HTN

## 2022-10-04 NOTE — Assessment & Plan Note (Signed)
Likely secondary to steroid use.  No indication of infection at this time  - Repeat CBC in the a.m.

## 2022-10-04 NOTE — ED Notes (Signed)
John RN aware of assigned bed 

## 2022-10-04 NOTE — ED Provider Notes (Signed)
Patient seen by Dr. Izora Ribas with [facility]. No indication for emergent surgery at this time. However he does recommend admission, IV steroids, PT/OT. Discussed with Dr. Charleen Kirks with the hospitalist service who will plan on admission.   Nance Pear, MD 10/04/22 [phone]

## 2022-10-04 NOTE — Assessment & Plan Note (Signed)
Patient has a history of borderline type 2 diabetes, currently controlled with diet only.  Given high-dose steroid use, we will utilize SSI.  - Repeat A1c - SSI, moderate

## 2022-10-04 NOTE — ED Provider Notes (Signed)
[facility] Provider Note    Event Date/Time   First MD Initiated Contact with Patient 10/04/22 1038     (approximate)   History   Back Pain and Groin Pain   HPI  Steve Burnett is a 62 y.o. male with history of morbid obesity, coronary disease, hypertension, hyperlipidemia who comes in with concerns for back pain.  I reviewed the note where patient was seen on 3/2 where he had had a reaching injury at the end of February.  Patient able to ambulate without any evidence of cord compression and discharged with Robaxin, hydroxyzine.  She followed up on 3/7 with a Medrol Dosepak without any improvement.  He was also seen at [facility] for repeat oral steroids did have improvement after 3 days on that.  Patient reports that he has been dealing with low back pain over the past 3 weeks.  He states that he typically has issues with L1, L5.  He denies any upper back pain.  He states that even with multiple courses of steroids he is continue to have worsening pain and now he reports over the past 2 days some sensation changes in his left leg.  He states is hard to describe.  Thought that it feels numb but he reports that just feels that it has a different sensation to it denies ever having anything like this previously.  He denies any chest pain, shortness of breath.  He also reports increasing pain down his left leg feeling like something is behind his knee, pain in his left groin with a history of left testicular torsion.  He states that because of this worsening pain that he had been laying in bed over the past 24 hours and had actually gone up on the steroid doses that were prescribed to him to see if this would help but even with this he had significant discomfort.  He was able to stand up and ambulate he was trying to go to his car to come into the emergency room when he then slid onto his butt and hit the back of his head on the arm rail.  He denies any LOC denies any neck  injury.  He states that his pain is still just in the low lumbar area  Physical Exam   Triage Vital Signs: ED Triage Vitals  Enc Vitals Group     BP 10/04/22 1026 (!) 172/111     Pulse Rate 10/04/22 1026 81     Resp 10/04/22 1026 18     Temp 10/04/22 1025 98.2 F (36.8 C)     Temp Source 10/04/22 1025 Oral     SpO2 10/04/22 1026 95 %     Weight 10/04/22 1024 (!) 372 lb (168.7 kg)     Height 10/04/22 1024 5' 10.5" (1.791 m)     Head Circumference --      Peak Flow --      Pain Score 10/04/22 1024 10     Pain Loc --      Pain Edu? --      Excl. in [place]? --     Most recent vital signs: Vitals:   10/04/22 1025 10/04/22 1026  BP:  (!) 172/111  Pulse:  81  Resp:  18  Temp: 98.2 F (36.8 C)   SpO2:  95%     General: Awake, no distress.  CV:  Good peripheral perfusion.  Resp:  Normal effort.  Abd:  No distention.  Other:  Good distal  pulses in his feet.  Sensation is reportedly intact throughout.  He reports some pain behind his knee as well as some pain in his left groin left hip but no redness no warmth noted.  He is got some left testicle pain but no obvious swelling of his testicle noted.   ED Results / Procedures / Treatments   Labs (all labs ordered are listed, but only abnormal results are displayed) Labs Reviewed  CBC WITH DIFFERENTIAL/PLATELET - Abnormal; Notable for the following components:      Result Value   WBC 15.6 (*)    RBC 6.60 (*)    MCV 78.0 (*)    MCH 25.3 (*)    RDW 18.0 (*)    Platelets 401 (*)    Neutro Abs 13.3 (*)    Abs Immature Granulocytes 0.12 (*)    All other components within normal limits  COMPREHENSIVE METABOLIC PANEL - Abnormal; Notable for the following components:   Sodium 131 (*)    CO2 20 (*)    Glucose, Bld 220 (*)    BUN 30 (*)    All other components within normal limits  CHLAMYDIA/NGC RT PCR ([facility] ONLY)            URINALYSIS, ROUTINE W REFLEX MICROSCOPIC  CK       RADIOLOGY I have reviewed the CT had personally  interpreted no evidence of intercranial hemorrhage  PROCEDURES:  Critical Care performed: No  Procedures   MEDICATIONS ORDERED IN ED: Medications - No data to display   IMPRESSION / MDM / [place] / ED COURSE  I reviewed the triage vital signs and the nursing notes.   Patient's presentation is most consistent with acute presentation with potential threat to life or bodily function.   Patient comes in with increasing left back pain and left groin pain.  I suspect this is all related to his spine but he is also has a history of torsion of his testicle and complain of some pain behind his legs will get ultrasound to rule out DVT, testicular torsion.  Will get CT scan imaging to rule out any type of kidney stone as well as CT head to rule intracranial hemorrhage.  He denies hurting his neck from the fall.  Patient given 1 mg of IV Dilaudid to help with symptoms  CBC shows elevated white count but patient's been on steroids.  CMP shows slightly low sodium we will give some IV fluids. CK normal  CT head negative.  CT abdomen pelvis negative.  Ultrasound negative  IMPRESSION: 1. New large left central disc extrusion at L2-3 with 2.7 cm of caudal migration to the L3 inferior endplate resulting in severe spinal canal stenosis. Additionally, there is irregularity of the left lateral margin of the dura with adjacent intrathecal signal abnormality and possible herniation of epidural fat, raising the possibility of an intrathecal disc fragment. 2. Otherwise unchanged lumbar spondylosis, worst at L3-L4, where there is moderate spinal canal stenosis and moderate left neural foraminal narrowing.    Given patient's failed outpatient management of this with multiple doses of steroid courses will d/w NS yarbrough.   Neurosurgery is currently in the surgery.  They will come down to evaluate the patient and review images and help [place] on final disposition.  I reevaluated patient and  updated him on the plan and he expressed understanding.  He reports pain is well-controlled only with certain movements the pain seems to flareup      FINAL CLINICAL  IMPRESSION(S) / ED DIAGNOSES   Final diagnoses:  Lumbar herniated disc     Rx / DC Orders   ED Discharge Orders     None        Note:  This document was prepared using Dragon voice recognition software and may include unintentional dictation errors.   Vanessa Garrison, MD 10/04/22 1409

## 2022-10-04 NOTE — Assessment & Plan Note (Addendum)
Secondary to large central disc extrusion with 2.7 cm caudal migration leading to severe spinal canal stenosis.  Neurosurgery has been consulted and recommending for nonsurgical management given high risk for complications with surgery.  - Neurosurgery consulted; appreciate their recommendations - S/P Decadron 10 mg injection - Start prednisone 60 mg daily - Start Tylenol 1000 mg 3 times daily and ibuprofen 800 mg 3 times daily.  Discussed with patient risks with NSAIDs and cardiac history - Dilaudid for breakthrough or severe pain - Robaxin as needed for muscle spasms - PT/OT

## 2022-10-04 NOTE — Assessment & Plan Note (Signed)
Continue home antihypertensives 

## 2022-10-04 NOTE — Plan of Care (Signed)
  Problem: Activity: Goal: Risk for activity intolerance will decrease Outcome: Progressing   Problem: Coping: Goal: Level of anxiety will decrease Outcome: Progressing   Problem: Pain Managment: Goal: General experience of comfort will improve Outcome: Progressing   Problem: Safety: Goal: Ability to remain free from injury will improve Outcome: Progressing   Problem: Skin Integrity: Goal: Risk for impaired skin integrity will decrease Outcome: Progressing   

## 2022-10-04 NOTE — Progress Notes (Signed)
PHARMACIST - PHYSICIAN COMMUNICATION  CONCERNING:  Enoxaparin (Lovenox) for DVT Prophylaxis    RECOMMENDATION: Patient was prescribed enoxaprin '40mg'$  q24 hours for VTE prophylaxis.   Filed Weights   10/04/22 1024  Weight: (!) 168.7 kg (372 lb)    Body mass index is 52.62 kg/m.  Estimated Creatinine Clearance: 137 mL/min (by C-G formula based on SCr of 0.92 mg/dL).   Based on [place] patient is candidate for enoxaparin 0.'5mg'$ /kg TBW SQ every 24 hours based on BMI being >30.   DESCRIPTION: Pharmacy has adjusted enoxaparin dose per [facility] policy.  Patient is now receiving enoxaparin 85 mg every 24 hours    Dorothe Pea, PharmD Clinical Pharmacist  10/04/2022 7:31 PM

## 2022-10-04 NOTE — Assessment & Plan Note (Signed)
History of CAD s/p DES to LAD and RCA.  Patient has not been consistently taking his aspirin or Plavix.  Discussed with patient that IV NSAIDs would be beneficial for back pain, but too high risk given prothrombotic effects.  Will restart aspirin at this time but need to continue holding Plavix in case patient requires discectomy.  - Monitor for any signs of chest pain or angina - Restart home aspirin - Continue home statin - Continue home carvedilol and spironolactone

## 2022-10-04 NOTE — H&P (Addendum)
History and Physical    Patient: Steve Burnett [id number] DOB: 1961-05-19 DOA: 10/04/2022 DOS: the patient was seen and examined on 10/04/2022 PCP: Patient, No Pcp Per  Patient coming from: Home  Chief Complaint:  Chief Complaint  Patient presents with   Back Pain   Groin Pain   HPI: Steve Burnett is a 62 y.o. male with medical history significant of CAD s/p DES to LAD (2013) and RCA (2019), HFrEF with last EF of 45-50%, OSA, paroxysmal A-fib, hypertension, hyperlipidemia, who presents to the ED due to lower back pain.  Steve Burnett states that back pain started approximately 3 weeks ago. He did lift heavy objects approximately 2 weeks before onset but he did not have pain then. Pain initially came on gradually before suddenly exacerbating while lifting an object at work.  He denies any focal weakness, urinary incontinence or bowel incontinence.  He did have 1 moment of sensation changes along the anterior and medial aspect of his left thigh that was brief and self resolved.  He notes that pain is exacerbated by laying flat and trying to stand up.  He has been taking ibuprofen 800 mg intermittently with minimal relief.  Per chart review, patient seen in the ED on 09/23/2022 for lumbar pain, which time he was started on Robaxin, lidocaine patches and hydroxyzine.  He subsequently had a telehealth visit with his PCP on 09/25/2022, at which time he was started on a steroid pack.  He was also seen at [facility], which point his steroid dose was increased.  He was reevaluated via televisit on 10/02/2022 by his PCP, at which time it was recommended he go back to [facility].  ED course: On arrival to the ED, patient was hypertensive at 171/106 with heart rate of 82.  He was saturating at 94% on room air.  He was afebrile at 98.2.  Blood work remarkable for WBC of 15.6, hemoglobin of 16.7, platelets of 401, sodium 131, bicarb 20, glucose 220, BUN 30, creatinine 0.92 and GFR above 60.  CK within normal  limits at 61.  Urinalysis was obtained that did not show any acute abnormalities with exception of increased specific gravity.  CT head and CT of the abdomen/pelvis was obtained that did not show any acute abnormalities other than a small punctate renal stone.  Scrotum Doppler was obtained that was negative.  Left lower extremity Doppler obtained negative.  MRI of the lumbar spine obtained that demonstrated new large left central disc extrusion at the L2-3 with 2.7 cm caudal migration to the L3 inferior endplate resulting in severe spinal canal stenosis.  Neurosurgery consulted.  [facility] contacted for admission.  Review of Systems: As mentioned in the history of present illness. All other systems reviewed and are negative.  Past Medical History:  Diagnosis Date   Anxiety    Chicken pox    Chronic combined systolic (congestive) and diastolic (congestive) heart failure ([place])    a. 01/2018 Echo: EF 35-40%; b. 04/2018 Echo: EF 45-50%, diff HK, Gr2 DD; c. 02/2020 Echo: EF 45-50%, glob HK, Gr1 DD.   Coronary artery disease    a. 02/2012 NSTEMI/Cath/PCI: pLAD 60%, mLAD 80%, dLAD 70, nonobs LCX/RCA --> DES to prox/mid LAD; b. inf STEMI 01/23/18: ost/proxLAD 75% at the prox edge of the previously placed stent, mLAD 75%, OM1 50%, OM2 50%, pRCA 100% s/p PCI/DES, rPLA 70%; c. 02/2018 MV: Ef 44%, apical inferior infarct. No ischemia.   Dilated aortic root ([place])    a. 02/2020 Echo: Ao root  3.9cm.   Hyperlipidemia    Hypertension    Ischemic cardiomyopathy    a. echo 7/19: EF 35-40%, HK of the inf wall, Gr1DD; b. 04/2018 Echo: EF 45-50%, diff HK, Gr2 DD, mildly dil Ao root/Asc Al. Mild MR. Mild BAE. Nl RV fxn; c. 02/2020 Echo: EF 45-50%, glob HK, Gr1 DD. RVSP ~ 35.60mHg. Ao root 3.9cm.   Obesity    OSA (obstructive sleep apnea) 03/27/2014   PAF (paroxysmal atrial fibrillation) ([place] [employer]    [facility]; b. resolved with IV amiodarone; c. not placed on anticoagulation given brief episode,  acute illness, and need for DAPT   PVC's (premature ventricular contractions)    a. 02/2018 Holter: Frq PVC's. No afib. Beta blocker titrated.   VF (ventricular fibrillation) ([place]    [facility]p [facility] x 2   Past Surgical History:  Procedure Laterality Date   CARDIAC CATHETERIZATION  03/12/2012   CORONARY ANGIOPLASTY  03/12/2012   s/p drug eluting stent to the mid LAD : 3.5 x 38 mm Xience drug-eluting stent. Post dilated to 4.0 in mid segment and 4.5 proximally.   CORONARY/GRAFT ACUTE MI REVASCULARIZATION N/A 01/23/2018   Procedure: Coronary/Graft Acute MI Revascularization;  Surgeon: PIsaias Cowman MD;  [facility];  Service: Cardiovascular;  Laterality: N/A;   LEFT HEART CATH AND CORONARY ANGIOGRAPHY N/A 01/23/2018   Procedure: LEFT HEART CATH AND CORONARY ANGIOGRAPHY;  Surgeon: PIsaias Cowman MD;  [facility];  Service: Cardiovascular;  Laterality: N/A;   Social History:  reports that he has never smoked. He has never used smokeless tobacco. He reports current alcohol use of about 1.0 standard drink of alcohol per week. He reports that he does not currently use drugs after having used the following drugs: Marijuana.  Allergies  Allergen Reactions   Flomax [Tamsulosin] Nausea Only    Upset, lightheaded   Atorvastatin Other (See Comments)    Muscle cramps    Crestor [Rosuvastatin Calcium] Other (See Comments)    Cramps    Family History  Problem Relation Age of Onset   Heart disease Mother    Hyperlipidemia Mother    Hypertension Mother    Heart attack Mother 557      CABG X 4; past at age 60  Heart disease Sister 564      s/p stents    Stroke Father    Cancer Neg Hx    Diabetes Neg Hx     Prior to Admission medications   Medication Sig Start Date End Date Taking? Authorizing Provider  amoxicillin-clavulanate (AUGMENTIN) 875-125 MG tablet Take 1 tablet by mouth 2 (two) times daily. Patient not  taking: Reported on 09/28/2022 11/10/21   MBrunetta Jeans PA-C  aspirin 81 MG chewable tablet Chew 1 tablet (81 mg total) by mouth daily. 01/26/18   Dunn, RAreta Haber PA-C  carvedilol (COREG) 25 MG tablet Take 1 tablet (25 mg total) by mouth 2 (two) times daily with a meal. 09/28/22   AWellington Hampshire MD  citalopram (CELEXA) 10 MG tablet TAKE 1 TABLET BY MOUTH EVERY DAY Patient not taking: Reported on 09/28/2022 04/14/21   WKennyth Arnold FNP  clopidogrel (PLAVIX) 75 MG tablet Take 1 tablet (75 mg total) by mouth daily. 09/28/22   AWellington Hampshire MD  fluticasone (FLONASE) 50 MCG/ACT nasal spray Place 2 sprays into both nostrils daily. 06/23/21   BMar Daring PA-C  hydrOXYzine (ATARAX)  25 MG tablet Take 1 tablet (25 mg total) by mouth 3 (three) times daily as needed for anxiety. 09/23/22   Vladimir Crofts, MD  lidocaine (LIDODERM) 5 % Place 1 patch onto the skin every 12 (twelve) hours. Remove & Discard patch within 12 hours or as directed by MD 09/23/22 09/23/23  Vladimir Crofts, MD  lisinopril (ZESTRIL) 40 MG tablet Take 1 tablet (40 mg total) by mouth daily. 09/28/22   Wellington Hampshire, MD  meclizine (ANTIVERT) 25 MG tablet Take 1 tablet (25 mg total) by mouth 3 (three) times daily as needed for dizziness. Patient not taking: Reported on 09/28/2022 06/23/21   Mar Daring, PA-C  methocarbamol (ROBAXIN) 500 MG tablet Take 1 tablet (500 mg total) by mouth every 8 (eight) hours as needed for muscle spasms. 09/23/22   Vladimir Crofts, MD  methylPREDNISolone (MEDROL DOSEPAK) 4 MG TBPK tablet 6 day taper; take as directed on package instructions 09/25/22   Mar Daring, PA-C  rosuvastatin (CRESTOR) 10 MG tablet TAKE 1 TABLET BY MOUTH EVERY DAY AT [facility] 09/28/22   Wellington Hampshire, MD  spironolactone (ALDACTONE) 25 MG tablet Take 1 tablet (25 mg total) by mouth daily. 09/28/22   Wellington Hampshire, MD    Physical Exam: Vitals:   10/04/22 1530 10/04/22 1800 10/04/22 1814 10/04/22 2000  BP: (!) 173/112 (!) 145/95   (!) 140/101  Pulse: (!) 56 82  84  Resp: '18 18  18  '$ Temp:   98.4 F (36.9 C) 98.4 F (36.9 C)  TempSrc:   Oral   SpO2: 95% 92%  93%  Weight:      Height:       Physical Exam Vitals and nursing note reviewed.  Constitutional:      General: He is not in acute distress.    Appearance: He is morbidly obese.  HENT:     Head: Normocephalic and atraumatic.  Eyes:     Conjunctiva/sclera: Conjunctivae normal.     Pupils: Pupils are equal, round, and reactive to light.  Cardiovascular:     Rate and Rhythm: Normal rate and regular rhythm.     Heart sounds: No murmur heard.    No gallop.  Pulmonary:     Effort: Pulmonary effort is normal. No respiratory distress.     Breath sounds: Normal breath sounds. No wheezing, rhonchi or rales.  Musculoskeletal:     Lumbar back: Tenderness (along left sacrum) present.     Right lower leg: No edema.     Left lower leg: No edema.  Skin:    General: Skin is warm and dry.  Neurological:     General: No focal deficit present.     Mental Status: He is alert and oriented to person, place, and time.     Motor: No weakness.  Psychiatric:        Mood and Affect: Mood normal.        Behavior: Behavior normal.    Data Reviewed: CBC with WBC of 15.6, hemoglobin of 16.7, MCV of 78, and platelets of 401 CMP with sodium of 131, potassium 4.0, bicarb 20, glucose 220, BUN 30, creatinine 0.92, AST 23, ALT 30, and GFR above 60 CK within normal limits at 61 Urinalysis with yellow color and increase specific gravity but negative for hematuria, ketones, leukocytes or nitrites   CT ABDOMEN PELVIS W CONTRAST  Result Date: 10/04/2022 CLINICAL DATA:  Abdominal pain, acute nonlocalized. Back pain for 3 weeks which has progressively worsened. Groin pain  radiating into the left leg. EXAM: CT ABDOMEN AND PELVIS WITH CONTRAST TECHNIQUE: Multidetector CT imaging of the abdomen and pelvis was performed using the standard protocol following bolus administration of  intravenous contrast. RADIATION DOSE REDUCTION: This exam was performed according to the departmental dose-optimization program which includes automated exposure control, adjustment of the mA and/or kV according to patient size and/or use of iterative reconstruction technique. CONTRAST:  158m OMNIPAQUE IOHEXOL 350 MG/ML SOLN COMPARISON:  Scrotal ultrasound and lumbar MRI same date. FINDINGS: Lower chest: Clear lung bases. No significant pleural or pericardial effusion. Hepatobiliary: Decreased density throughout the liver consistent with steatosis. No focal lesion or abnormal enhancement identified. No evidence of gallstones, gallbladder wall thickening or biliary dilatation. Pancreas: Unremarkable. No pancreatic ductal dilatation or surrounding inflammatory changes. Spleen: Normal in size without focal abnormality. Adrenals/Urinary Tract: Both adrenal glands appear normal. Punctate nonobstructing calculus in the lower pole of the left kidney, best seen on coronal image 74/5. No other evidence of urinary tract calculus, hydronephrosis or suspicious renal lesion. There is mild symmetric perinephric soft tissue stranding bilaterally which is nonspecific. There are 2 tiny low-density right renal lesions consistent with Bosniak 2 cysts; no follow-up imaging recommended. The bladder appears unremarkable for its degree of distention. Stomach/Bowel: No enteric contrast administered. The stomach appears unremarkable for its degree of distension. No evidence of bowel wall thickening, distention or surrounding inflammatory change. The appendix appears normal. There are diverticular changes in the sigmoid colon without evidence of acute inflammation. Vascular/Lymphatic: There are no enlarged abdominal or pelvic lymph nodes. Aortic and branch vessel atherosclerosis without evidence of aneurysm or large vessel occlusion. Reproductive: The prostate gland and seminal vesicles appear normal. Other: No evidence of abdominal wall  mass or hernia. No ascites. Musculoskeletal: No acute or significant osseous findings. Lower lumbar spondylosis, greatest at L5-S1. Left sided disc extrusion at L2-3, better seen on earlier MRI. IMPRESSION: 1. No acute findings or explanation for the patient's symptoms. 2. Punctate nonobstructing left renal calculus. No evidence of ureteral calculus or hydronephrosis. 3. Hepatic steatosis. 4. Sigmoid diverticulosis without evidence of acute inflammation. 5. See lumbar MRI report. 6.  Aortic Atherosclerosis (ICD10-I70.0). Electronically Signed   By: WRichardean SaleM.D.   On: 10/04/2022 13:34   CT HEAD WO CONTRAST (5MM)  Result Date: 10/04/2022 CLINICAL DATA:  Head trauma.  Mental status changes. EXAM: CT HEAD WITHOUT CONTRAST TECHNIQUE: Contiguous axial images were obtained from the base of the skull through the vertex without intravenous contrast. RADIATION DOSE REDUCTION: This exam was performed according to the departmental dose-optimization program which includes automated exposure control, adjustment of the mA and/or kV according to patient size and/or use of iterative reconstruction technique. COMPARISON:  06/22/2021 FINDINGS: Brain: There is no evidence for acute hemorrhage, hydrocephalus, mass lesion, or abnormal extra-axial fluid collection. No definite CT evidence for acute infarction. Vascular: No hyperdense vessel or unexpected calcification. Skull: Normal. Negative for fracture or focal lesion. Sinuses/Orbits: No acute finding. Other: None. IMPRESSION: No acute intracranial abnormality. Electronically Signed   By: EMisty StanleyM.D.   On: 10/04/2022 13:29   [place]CROTUM W/DOPPLER  Result Date: 10/04/2022 CLINICAL DATA:  Pain in left testicle for 3 weeks EXAM: SCROTAL ULTRASOUND DOPPLER ULTRASOUND OF THE TESTICLES TECHNIQUE: Complete ultrasound examination of the testicles, epididymis, and other scrotal structures was performed. Color and spectral Doppler ultrasound were also utilized to evaluate  blood flow to the testicles. COMPARISON:  None Available. FINDINGS: Right testicle Measurements: 6.2 x 3.3 x 3.9 cm. No mass  or microlithiasis visualized. Left testicle Measurements: 5.3 x 3.4 x 4.3 cm. No mass or microlithiasis visualized. Right epididymis:  Normal in size and appearance. Left epididymis:  Normal in size and appearance. Hydrocele:  None visualized. Varicocele:  None visualized. Pulsed Doppler interrogation of both testes demonstrates normal low resistance arterial and venous waveforms bilaterally. IMPRESSION: Normal size and ultrasound appearance of the bilateral testicles. Arterial and venous Doppler flow is present bilaterally. No ultrasound findings to explain pain. Electronically Signed   By: Delanna Ahmadi M.D.   On: 10/04/2022 13:17   US Venous Img Lower Unilateral Left  Result Date: 10/04/2022 CLINICAL DATA:  Posterior left knee pain for several days. EXAM: LEFT LOWER EXTREMITY VENOUS DOPPLER ULTRASOUND TECHNIQUE: Gray-scale sonography with compression, as well as color and duplex ultrasound, were performed to evaluate the deep venous system(s) from the level of the common femoral vein through the popliteal and proximal calf veins. COMPARISON:  None Available. FINDINGS: VENOUS Normal compressibility of the common femoral, superficial femoral, and popliteal veins, as well as the visualized calf veins. Visualized portions of profunda femoral vein and great saphenous vein unremarkable. No filling defects to suggest DVT on grayscale or color Doppler imaging. Doppler waveforms show normal direction of venous flow, normal respiratory plasticity and response to augmentation. Limited views of the contralateral common femoral vein are unremarkable. OTHER None. Limitations: None. IMPRESSION: Negative. Electronically Signed   By: Titus Dubin M.D.   On: 10/04/2022 13:15   MR LUMBAR SPINE WO CONTRAST  Result Date: 10/04/2022 CLINICAL DATA:  Low back pain, cauda equina syndrome suspected. EXAM:  MRI LUMBAR SPINE WITHOUT CONTRAST TECHNIQUE: Multiplanar, multisequence MR imaging of the lumbar spine was performed. No intravenous contrast was administered. COMPARISON:  MRI lumbar spine 04/23/2015. FINDINGS: Segmentation: Conventional numbering is assumed with 5 non-rib-bearing, lumbar type vertebral bodies. Alignment:  Normal. Vertebrae: Modic type 2 degenerative endplate marrow signal changes at L3-4 and L5-S1. Conus medullaris and cauda equina: Conus extends to the L1 level. Conus and cauda equina appear normal. Paraspinal and other soft tissues: Unremarkable. Disc levels: T12-L1:  Normal. L1-L2: Disc bulge and epidural lipomatosis results in mild narrowing of the thecal sac. No neural foraminal narrowing. Mild bilateral facet arthropathy. L2-L3: New large left central disc extrusion with 2.7 cm of caudal migration to the L3 inferior endplate resulting in severe spinal canal stenosis. On axial images 21 and 22 of series 8 and 9, there is irregularity of the left lateral margin of the dura with adjacent intrathecal signal abnormality and possible herniation of epidural fat, raising the possibility of an intrathecal disc fragment. L3-L4: Disc bulge and facet arthropathy contribute to moderate spinal canal stenosis, moderate left and mild right neural foraminal narrowing, unchanged. L4-L5: Disc bulge and facet arthropathy results in mild spinal canal stenosis with mild bilateral neural foraminal narrowing, unchanged. L5-S1: Left eccentric disc bulge displaces the traversing left S1 nerve root in the lateral recess, unchanged. Moderate disc height loss and facet arthropathy contribute to moderate left and mild right neural foraminal narrowing, unchanged. IMPRESSION: 1. New large left central disc extrusion at L2-3 with 2.7 cm of caudal migration to the L3 inferior endplate resulting in severe spinal canal stenosis. Additionally, there is irregularity of the left lateral margin of the dura with adjacent  intrathecal signal abnormality and possible herniation of epidural fat, raising the possibility of an intrathecal disc fragment. 2. Otherwise unchanged lumbar spondylosis, worst at L3-L4, where there is moderate spinal canal stenosis and moderate left neural foraminal narrowing. Electronically  Signed   By: Emmit Alexanders M.D.   On: 10/04/2022 12:39    There are no new results to review at this time.  Assessment and Plan:  * Spinal stenosis of lumbar region Secondary to large central disc extrusion with 2.7 cm caudal migration leading to severe spinal canal stenosis.  Neurosurgery has been consulted and recommending for nonsurgical management given high risk for complications with surgery.  - Neurosurgery consulted; appreciate their recommendations - S/P Decadron 10 mg injection - Start prednisone 60 mg daily - Start Tylenol 1000 mg 3 times daily and ibuprofen 800 mg 3 times daily.  Discussed with patient risks with NSAIDs and cardiac history - Dilaudid for breakthrough or severe pain - Robaxin as needed for muscle spasms - PT/OT  Coronary artery disease History of CAD s/p DES to LAD and RCA.  Patient has not been consistently taking his aspirin or Plavix.  Discussed with patient that IV NSAIDs would be beneficial for back pain, but too high risk given prothrombotic effects.  Will restart aspirin at this time but need to continue holding Plavix in case patient requires discectomy.  - Monitor for any signs of chest pain or angina - Restart home aspirin - Continue home statin - Continue home carvedilol and spironolactone  Type 2 diabetes mellitus with hyperglycemia, without long-term current use of insulin ([place]) Patient has a history of borderline type 2 diabetes, currently controlled with diet only.  Given high-dose steroid use, we will utilize SSI.  - Repeat A1c - SSI, moderate  Essential hypertension - Continue home antihypertensives  Leukocytosis Likely secondary to steroid use.   No indication of infection at this time  - Repeat CBC in the a.m.  Advance Care Planning:   Code Status: Full Code verified by patient  Consults: Neurosurgery  Family Communication: No family at bedside   Severity of Illness: The appropriate patient status for this patient is OBSERVATION. Observation status is judged to be reasonable and necessary in order to provide the required intensity of service to ensure the patient's safety. The patient's presenting symptoms, physical exam findings, and initial radiographic and laboratory data in the context of their medical condition is felt to place them at decreased risk for further clinical deterioration. Furthermore, it is anticipated that the patient will be medically stable for discharge from the hospital within 2 midnights of admission.   Author: Jose Persia, MD 10/04/2022 8:41 PM  For on call review [email or web address].

## 2022-10-05 DIAGNOSIS — Y990 Civilian activity done for income or pay: Secondary | ICD-10-CM | POA: Diagnosis not present

## 2022-10-05 DIAGNOSIS — Z8249 Family history of ischemic heart disease and other diseases of the circulatory system: Secondary | ICD-10-CM | POA: Diagnosis not present

## 2022-10-05 DIAGNOSIS — M48061 Spinal stenosis, lumbar region without neurogenic claudication: Secondary | ICD-10-CM | POA: Diagnosis present

## 2022-10-05 DIAGNOSIS — Y9289 Other specified places as the place of occurrence of the external cause: Secondary | ICD-10-CM | POA: Diagnosis not present

## 2022-10-05 DIAGNOSIS — I110 Hypertensive heart disease with heart failure: Secondary | ICD-10-CM | POA: Diagnosis present

## 2022-10-05 DIAGNOSIS — M549 Dorsalgia, unspecified: Secondary | ICD-10-CM | POA: Diagnosis present

## 2022-10-05 DIAGNOSIS — Z7902 Long term (current) use of antithrombotics/antiplatelets: Secondary | ICD-10-CM | POA: Diagnosis not present

## 2022-10-05 DIAGNOSIS — M5126 Other intervertebral disc displacement, lumbar region: Principal | ICD-10-CM

## 2022-10-05 DIAGNOSIS — R262 Difficulty in walking, not elsewhere classified: Secondary | ICD-10-CM | POA: Diagnosis present

## 2022-10-05 DIAGNOSIS — Z83438 Family history of other disorder of lipoprotein metabolism and other lipidemia: Secondary | ICD-10-CM | POA: Diagnosis not present

## 2022-10-05 DIAGNOSIS — E785 Hyperlipidemia, unspecified: Secondary | ICD-10-CM | POA: Diagnosis present

## 2022-10-05 DIAGNOSIS — S33121A Dislocation of L2/L3 lumbar vertebra, initial encounter: Secondary | ICD-10-CM | POA: Diagnosis present

## 2022-10-05 DIAGNOSIS — I255 Ischemic cardiomyopathy: Secondary | ICD-10-CM | POA: Diagnosis present

## 2022-10-05 DIAGNOSIS — I252 Old myocardial infarction: Secondary | ICD-10-CM | POA: Diagnosis not present

## 2022-10-05 DIAGNOSIS — I77810 Thoracic aortic ectasia: Secondary | ICD-10-CM | POA: Diagnosis present

## 2022-10-05 DIAGNOSIS — T380X5A Adverse effect of glucocorticoids and synthetic analogues, initial encounter: Secondary | ICD-10-CM | POA: Diagnosis present

## 2022-10-05 DIAGNOSIS — X500XXA Overexertion from strenuous movement or load, initial encounter: Secondary | ICD-10-CM | POA: Diagnosis not present

## 2022-10-05 DIAGNOSIS — D72829 Elevated white blood cell count, unspecified: Secondary | ICD-10-CM | POA: Diagnosis present

## 2022-10-05 DIAGNOSIS — Z823 Family history of stroke: Secondary | ICD-10-CM | POA: Diagnosis not present

## 2022-10-05 DIAGNOSIS — M5116 Intervertebral disc disorders with radiculopathy, lumbar region: Secondary | ICD-10-CM

## 2022-10-05 DIAGNOSIS — G4733 Obstructive sleep apnea (adult) (pediatric): Secondary | ICD-10-CM | POA: Diagnosis present

## 2022-10-05 DIAGNOSIS — Z955 Presence of coronary angioplasty implant and graft: Secondary | ICD-10-CM | POA: Diagnosis not present

## 2022-10-05 DIAGNOSIS — I5042 Chronic combined systolic (congestive) and diastolic (congestive) heart failure: Secondary | ICD-10-CM | POA: Diagnosis present

## 2022-10-05 DIAGNOSIS — I2510 Atherosclerotic heart disease of native coronary artery without angina pectoris: Secondary | ICD-10-CM | POA: Diagnosis present

## 2022-10-05 DIAGNOSIS — Z6841 Body Mass Index (BMI) 40.0 and over, adult: Secondary | ICD-10-CM | POA: Diagnosis not present

## 2022-10-05 DIAGNOSIS — N200 Calculus of kidney: Secondary | ICD-10-CM | POA: Diagnosis present

## 2022-10-05 DIAGNOSIS — M5416 Radiculopathy, lumbar region: Secondary | ICD-10-CM | POA: Diagnosis present

## 2022-10-05 DIAGNOSIS — E1165 Type 2 diabetes mellitus with hyperglycemia: Secondary | ICD-10-CM | POA: Diagnosis present

## 2022-10-05 DIAGNOSIS — I480 Paroxysmal atrial fibrillation: Secondary | ICD-10-CM | POA: Diagnosis present

## 2022-10-05 LAB — BASIC METABOLIC PANEL
Anion gap: 9 (ref 5–15)
BUN: 39 mg/dL — ABNORMAL HIGH (ref 6–20)
CO2: 24 mmol/L (ref 22–32)
Calcium: 8.7 mg/dL — ABNORMAL LOW (ref 8.9–10.3)
Chloride: 101 mmol/L (ref 98–111)
Creatinine, Ser: 0.79 mg/dL (ref 0.61–1.24)
GFR, Estimated: >60 mL/min (ref >60)
Glucose, Bld: 166 mg/dL — ABNORMAL HIGH (ref 70–99)
Potassium: 4.6 mmol/L (ref 3.5–5.1)
Sodium: 134 mmol/L — ABNORMAL LOW (ref 135–145)

## 2022-10-05 LAB — CBC
HCT: 48.6 % (ref 39.0–52.0)
Hemoglobin: 15.8 g/dL (ref 13.0–17.0)
MCH: 25.4 pg — ABNORMAL LOW (ref 26.0–34.0)
MCHC: 32.5 g/dL (ref 30.0–36.0)
MCV: 78 fL — ABNORMAL LOW (ref 80.0–100.0)
Platelets: 359 10*3/uL (ref 150–400)
RBC: 6.23 MIL/uL — ABNORMAL HIGH (ref 4.22–5.81)
RDW: 17.8 % — ABNORMAL HIGH (ref 11.5–15.5)
WBC: 13.8 10*3/uL — ABNORMAL HIGH (ref 4.0–10.5)
nRBC: 0 % (ref 0.0–0.2)

## 2022-10-05 LAB — GLUCOSE, CAPILLARY
Glucose-Capillary: 158 mg/dL — ABNORMAL HIGH (ref 70–99)
Glucose-Capillary: 236 mg/dL — ABNORMAL HIGH (ref 70–99)
Glucose-Capillary: 237 mg/dL — ABNORMAL HIGH (ref 70–99)
Glucose-Capillary: 147 mg/dL — ABNORMAL HIGH (ref 70–99)

## 2022-10-05 LAB — HIV ANTIBODY (ROUTINE TESTING W REFLEX): HIV Screen 4th Generation wRfx: NONREACTIVE

## 2022-10-05 MED ORDER — BISACODYL 5 MG PO TBEC
10.0000 mg | DELAYED_RELEASE_TABLET | Freq: Once | ORAL | Status: AC
  Administered 2022-10-05: 10 mg via ORAL
  Filled 2022-10-05: qty 2

## 2022-10-05 MED ORDER — POLYETHYLENE GLYCOL 3350 17 G PO PACK
17.0000 g | PACK | Freq: Two times a day (BID) | ORAL | Status: DC
  Administered 2022-10-05 – 2022-10-10 (×8): 17 g via ORAL
  Filled 2022-10-05 (×10): qty 1

## 2022-10-05 MED ORDER — OXYCODONE HCL 5 MG PO TABS
5.0000 mg | ORAL_TABLET | Freq: Four times a day (QID) | ORAL | Status: DC | PRN
  Administered 2022-10-05 – 2022-10-06 (×4): 5 mg via ORAL
  Filled 2022-10-05 (×5): qty 1

## 2022-10-05 MED ORDER — HYDRALAZINE HCL 20 MG/ML IJ SOLN: 10.0000 mg | Freq: Four times a day (QID) | INTRAMUSCULAR | Status: DC | PRN

## 2022-10-05 MED ORDER — BISACODYL 5 MG PO TBEC
10.0000 mg | DELAYED_RELEASE_TABLET | Freq: Every day | ORAL | Status: DC
  Administered 2022-10-05 – 2022-10-09 (×5): 10 mg via ORAL
  Filled 2022-10-05 (×5): qty 2

## 2022-10-05 MED ORDER — IBUPROFEN 400 MG PO TABS
600.0000 mg | ORAL_TABLET | Freq: Three times a day (TID) | ORAL | Status: DC
  Administered 2022-10-05 – 2022-10-10 (×15): 600 mg via ORAL
  Filled 2022-10-05 (×15): qty 2

## 2022-10-05 MED ORDER — PANTOPRAZOLE SODIUM 40 MG PO TBEC
40.0000 mg | DELAYED_RELEASE_TABLET | Freq: Two times a day (BID) | ORAL | Status: DC
  Administered 2022-10-05 – 2022-10-10 (×11): 40 mg via ORAL
  Filled 2022-10-05 (×11): qty 1

## 2022-10-05 MED ORDER — HYDRALAZINE HCL 50 MG PO TABS: 50.0000 mg | ORAL_TABLET | Freq: Four times a day (QID) | ORAL | Status: DC | PRN

## 2022-10-05 NOTE — Evaluation (Signed)
Occupational Therapy Evaluation Patient Details Name: Steve Burnett MRN: [id number] DOB: April 20, 1961 Today's Date: 10/05/2022   History of Present Illness Pt is a 62 y/o M admitted on 10/04/22 after presenting with c/o lower back pain.  MRI of the lumbar spine obtained that demonstrated new large left central disc extrusion at the L2-3 with 2.7 cm caudal migration to the L3 inferior endplate resulting in severe spinal canal stenosis. PMH: CAD s/p DES to LAD, RCA, HFrEF, OSA, paroxysmal a-fib, HTN, HLD   Clinical Impression   Patient presenting with decreased Ind in self care,balance, functional mobility/transfers, endurance, and safety awareness. Patient reports living at home independently with wife and working full time in a lab. OT discussed back precautions for self care tasks as well as modifications for work space based on his reported job requirement. Pt reports he has been doing circle sitting at home to don LB clothing items. He attempted figure four while in bed but unable to fully bring to lap. Pt demonstrates log roll to EOB but once seated EOB pain increased and pt tolerated for 3-4 minutes before returning back to  bed. Patient will benefit from acute OT to increase overall independence in the areas of ADLs, functional mobility, and safety awareness in order to safely discharge home with support.      Recommendations for follow up therapy are one component of a multi-disciplinary discharge planning process, led by the attending physician.  Recommendations may be updated based on patient status, additional functional criteria and insurance authorization.   Follow Up Recommendations  No OT follow up     Assistance Recommended at Discharge Intermittent Supervision/Assistance  Patient can return home with the following A little help with walking and/or transfers;A little help with bathing/dressing/bathroom;Assistance with cooking/housework;Assist for transportation;Help with stairs or  ramp for entrance    Functional Status Assessment  Patient has had a recent decline in their functional status and demonstrates the ability to make significant improvements in function in a reasonable and predictable amount of time.  Equipment Recommendations  None recommended by OT       Precautions / Restrictions Precautions Precautions: Back Restrictions Weight Bearing Restrictions: No      Mobility Bed Mobility Overal bed mobility: Needs Assistance Bed Mobility: Sidelying to Sit, Sit to Sidelying   Sidelying to sit: Supervision     Sit to sidelying: Min guard General bed mobility comments: cuing for log rolling    Transfers Overall transfer level: Needs assistance Equipment used: None Transfers: Sit to/from Stand Sit to Stand: Supervision                  Balance Overall balance assessment: Mild deficits observed, not formally tested                                         ADL either performed or assessed with clinical judgement   ADL Overall ADL's : Needs assistance/impaired                                       General ADL Comments: supervision - min A ( LB dressing)     Vision Patient Visual Report: No change from baseline              Pertinent Vitals/Pain Pain Assessment Pain Assessment: 0-10 Pain Score: 7  Pain Location: back Pain Descriptors / Indicators: Grimacing, Guarding, Discomfort Pain Intervention(s): Limited activity within patient's tolerance, Premedicated before session, Repositioned     Hand Dominance     Extremity/Trunk Assessment Upper Extremity Assessment Upper Extremity Assessment: Overall WFL for tasks assessed   Lower Extremity Assessment Lower Extremity Assessment: Overall WFL for tasks assessed   Cervical / Trunk Assessment Cervical / Trunk Assessment: Normal   Communication Communication Communication: No difficulties   Cognition Arousal/Alertness: Awake/alert Behavior  During Therapy: WFL for tasks assessed/performed Overall Cognitive Status: Within Functional Limits for tasks assessed                                       General Comments  Pt with continent void on toilet.            Home Living Family/patient expects to be discharged to:: Private residence Living Arrangements: Spouse/significant other Available Help at Discharge: Family Type of Home: House Home Access: Stairs to enter Technical brewer of Steps: 3 Entrance Stairs-Rails: Right Home Layout: Multi-level Alternate Level Stairs-Number of Steps: 1 Alternate Level Stairs-Rails: None Bathroom Shower/Tub: Tub/shower unit         Home Equipment: None          Prior Functioning/Environment Prior Level of Function : Independent/Modified Independent;Working/employed             Mobility Comments: Pt reports he was independent & working prior to admission. ADLs Comments: Ind with self care and IADLs prior to admission. He works in a lab and is often seated in chair and twisting.        OT Problem List: Decreased strength;Decreased activity tolerance;Decreased safety awareness;Impaired balance (sitting and/or standing);Decreased knowledge of precautions      OT Treatment/Interventions: Self-care/ADL training;Therapeutic exercise;Therapeutic activities;Energy conservation;DME and/or AE instruction;Patient/family education;Balance training    OT Goals(Current goals can be found in the care plan section) Acute Rehab OT Goals Patient Stated Goal: to return home OT Goal Formulation: With patient Time For Goal Achievement: 10/19/22 Potential to Achieve Goals: Good ADL Goals Pt Will Perform Grooming: with modified independence;standing Pt Will Transfer to Toilet: with modified independence;ambulating Pt Will Perform Toileting - Clothing Manipulation and hygiene: with modified independence;sit to/from stand Pt Will Perform Tub/Shower Transfer: with  modified independence  OT Frequency: Min 2X/week       AM-PAC OT "6 Clicks" Daily Activity     Outcome Measure Help from another person eating meals?: None Help from another person taking care of personal grooming?: None Help from another person toileting, which includes using toliet, bedpan, or urinal?: A Little Help from another person bathing (including washing, rinsing, drying)?: A Little Help from another person to put on and taking off regular upper body clothing?: None Help from another person to put on and taking off regular lower body clothing?: A Little 6 Click Score: 21   End of Session Nurse Communication: Mobility status  Activity Tolerance: Patient limited by pain Patient left: in bed;with call bell/phone within reach;with bed alarm set  OT Visit Diagnosis: Unsteadiness on feet (R26.81);Muscle weakness (generalized) (M62.81);Pain Pain - part of body:  (back)                Time: [id number] OT Time Calculation (min): 21 min Charges:  OT General Charges $OT Visit: 1 Visit OT Evaluation $OT Eval Low Complexity: 1 Low OT Treatments $Self Care/Home Management : 8-22 mins  Darleen Crocker,  MS, OTR/L , CBIS ascom [phone]  10/05/22, 2:23 PM

## 2022-10-05 NOTE — Progress Notes (Signed)
    Attending Progress Note  History: Steve Burnett is here for lumbar radiculopathy with severe pain and inability to walk.  HD2: Doing somewhat better.  Was able to ambulate with PT.  Able to go to restroom by himself.  Physical Exam: Vitals:   10/05/22 0817 10/05/22 1422  BP: (!) 157/92 (!) 149/90  Pulse: (!) 59 64  Resp: 18 17  Temp: 97.6 F (36.4 C) 97.9 F (36.6 C)  SpO2: 93% 92%    AA Ox3 CNI  Strength:5/5 throughout BLE  Data:  Other tests/results: n.a  Assessment/Plan:  Steve Burnett is suffering from lumbar radiculopathy due to L2/3 disc herniation.  He has full strength and does not  have cauda equina syndrome  - mobilize - pain control - DVT prophylaxis - PTOT - I have discussed an ESI with the patient.  He would like to pursue this, and is aware that it would delay any surgical intervention outside of emergency surgery.  Meade Maw MD, [facility] Department of Neurosurgery

## 2022-10-05 NOTE — Evaluation (Signed)
Physical Therapy Evaluation Patient Details Name: Steve Burnett MRN: [id number] DOB: [birth year] Today's Date: 10/05/2022  History of Present Illness  Pt is a 62 y/o M admitted on 10/04/22 after presenting with c/o lower back pain.  MRI of the lumbar spine obtained that demonstrated new large left central disc extrusion at the L2-3 with 2.7 cm caudal migration to the L3 inferior endplate resulting in severe spinal canal stenosis. PMH: CAD s/p DES to LAD, RCA, HFrEF, OSA, paroxysmal a-fib, HTN, HLD  Clinical Impression  Pt seen for PT evaluation with pt agreeable. Pt reports he's premedicated & requests more pain meds at end of session - nurse notified. Pt reports prior to admission he was independent without AD & working. On this date, PT educates pt on log rolling to decrease pain with movement with pt reporting it does feel better. Pt requires min assist to return to bed at end of session. Pt is able to complete STS without assistance but does benefit from RW for BUE support for pain management during gait. Pt is limited in mobility 2/2 increasing back pain, only walking ~15 ft x 2. Currently recommending RW upon d/c as well as OPPT. Anticipate pt can mobilize better once pain is more managed.   Recommendations for follow up therapy are one component of a multi-disciplinary discharge planning process, led by the attending physician.  Recommendations may be updated based on patient status, additional functional criteria and insurance authorization.  Follow Up Recommendations Outpatient PT      Assistance Recommended at Discharge PRN  Patient can return home with the following  Assistance with cooking/housework;Assist for transportation;Help with stairs or ramp for entrance    Equipment Recommendations Rolling walker (2 wheels)  Recommendations for Other Services       Functional Status Assessment Patient has had a recent decline in their functional status and demonstrates the ability to make  significant improvements in function in a reasonable and predictable amount of time.     Precautions / Restrictions Precautions Precautions: Back Restrictions Weight Bearing Restrictions: No      Mobility  Bed Mobility Overal bed mobility: Needs Assistance Bed Mobility: Rolling, Sidelying to Sit, Sit to Sidelying Rolling: Supervision Sidelying to sit: Supervision, HOB elevated     Sit to sidelying: Min assist, HOB elevated (to elevate BLE feet onto bed) General bed mobility comments: PT provides cuing/education re: log rolling    Transfers Overall transfer level: Needs assistance Equipment used: None Transfers: Sit to/from Stand Sit to Stand: Supervision           General transfer comment: STS without AD but pt reaching for counter for support, is able to complete STS from low toilet with use of grab bar & RW    Ambulation/Gait Ambulation/Gait assistance: Supervision Gait Distance (Feet): 10 Feet (+ 10 ft) Assistive device: Rolling walker (2 wheels) Gait Pattern/deviations: Decreased step length - left, Decreased step length - right, Decreased stride length Gait velocity: decreased        Stairs            Wheelchair Mobility    Modified Rankin (Stroke Patients Only)       Balance Overall balance assessment: Mild deficits observed, not formally tested                                           Pertinent Vitals/Pain Pain Assessment Pain Assessment: 0-10  Pain Score:  (5/10 at beginning of session, increasing to 9/10 at end of session) Pain Location: back Pain Descriptors / Indicators: Grimacing, Guarding, Discomfort Pain Intervention(s): Monitored during session, Patient requesting pain meds-RN notified, Limited activity within patient's tolerance, Repositioned, Premedicated before session    Home Living Family/patient expects to be discharged to:: Private residence Living Arrangements: Spouse/significant other Available Help  at Discharge: Family Type of Home: House Home Access: Stairs to enter Entrance Stairs-Rails: Right Entrance Stairs-Number of Steps: 3 Alternate Level Stairs-Number of Steps: 1 Home Layout: Multi-level        Prior Function Prior Level of Function : Independent/Modified Independent;Working/employed             Mobility Comments: Pt reports he was independent & working prior to admission.       Hand Dominance        Extremity/Trunk Assessment   Upper Extremity Assessment Upper Extremity Assessment: Overall WFL for tasks assessed    Lower Extremity Assessment Lower Extremity Assessment: Overall WFL for tasks assessed    Cervical / Trunk Assessment Cervical / Trunk Assessment: Normal  Communication   Communication: No difficulties  Cognition Arousal/Alertness: Awake/alert Behavior During Therapy: WFL for tasks assessed/performed Overall Cognitive Status: Within Functional Limits for tasks assessed                                          General Comments General comments (skin integrity, edema, etc.): Pt with continent void on toilet.    Exercises     Assessment/Plan    PT Assessment Patient needs continued PT services  PT Problem List Pain;Decreased mobility;Decreased activity tolerance;Decreased knowledge of precautions;Decreased knowledge of use of DME       PT Treatment Interventions DME instruction;Therapeutic exercise;Gait training;Balance training;Stair training;Neuromuscular re-education;Functional mobility training;Patient/family education;Therapeutic activities    PT Goals (Current goals can be found in the Care Plan section)  Acute Rehab PT Goals Patient Stated Goal: decreased pain PT Goal Formulation: With patient Time For Goal Achievement: 10/19/22 Potential to Achieve Goals: Good    Frequency 7X/week     Co-evaluation               AM-PAC PT "6 Clicks" Mobility  Outcome Measure Help needed turning from your  back to your side while in a flat bed without using bedrails?: A Little Help needed moving from lying on your back to sitting on the side of a flat bed without using bedrails?: A Little Help needed moving to and from a bed to a chair (including a wheelchair)?: A Little Help needed standing up from a chair using your arms (e.g., wheelchair or bedside chair)?: A Little Help needed to walk in hospital room?: A Little Help needed climbing 3-5 steps with a railing? : A Lot 6 Click Score: 17    End of Session   Activity Tolerance: Patient limited by pain Patient left: in bed;with call bell/phone within reach Nurse Communication: Patient requests pain meds PT Visit Diagnosis: Pain;Difficulty in walking, not elsewhere classified (R26.2) Pain - part of body:  (back)    Time: [id number] PT Time Calculation (min) (ACUTE ONLY): 20 min   Charges:   PT Evaluation $PT Eval Low Complexity: 1 Low PT Treatments $Therapeutic Activity: 8-22 mins        Lavone Nian, PT, DPT 10/05/22, 10:58 AM   Waunita Schooner 10/05/2022, 10:56 AM

## 2022-10-05 NOTE — Plan of Care (Signed)
  Problem: Education: Goal: Ability to describe self-care measures that may prevent or decrease complications (Diabetes Survival Skills Education) will improve Outcome: Progressing   Problem: Skin Integrity: Goal: Risk for impaired skin integrity will decrease Outcome: Progressing   Problem: Pain Managment: Goal: General experience of comfort will improve Outcome: Progressing   Problem: Safety: Goal: Ability to remain free from injury will improve Outcome: Progressing   Problem: Skin Integrity: Goal: Risk for impaired skin integrity will decrease Outcome: Progressing   

## 2022-10-05 NOTE — Plan of Care (Signed)
  Problem: Education: Goal: Ability to describe self-care measures that may prevent or decrease complications (Diabetes Survival Skills Education) will improve Outcome: Progressing   Problem: Coping: Goal: Ability to adjust to condition or change in health will improve Outcome: Progressing   Problem: Fluid Volume: Goal: Ability to maintain a balanced intake and output will improve Outcome: Progressing   Problem: Nutritional: Goal: Maintenance of adequate nutrition will improve Outcome: Progressing   Problem: Tissue Perfusion: Goal: Adequacy of tissue perfusion will improve Outcome: Progressing   Problem: Education: Goal: Knowledge of General Education information will improve Description: Including pain rating scale, medication(s)/side effects and non-pharmacologic comfort measures Outcome: Progressing   

## 2022-10-05 NOTE — Progress Notes (Signed)
[facility] Progress Note  Patient: Steve Burnett    [id number]  DOA: 10/04/2022     Date of Service: the patient was seen and examined on 10/05/2022  Chief Complaint  Patient presents with   Back Pain   Groin Pain   Brief hospital course: Steve Burnett is a 62 y.o. male with medical history significant of CAD s/p DES to LAD (2013) and RCA (2019), HFrEF with last EF of 45-50%, OSA, paroxysmal A-fib, hypertension, hyperlipidemia, who presents to the ED due to lower back pain. He did lift heavy objects approximately 2 weeks before onset but he did not have pain then. Pain initially came on gradually before suddenly exacerbating while lifting an object at work. He notes that pain is exacerbated by laying flat and trying to stand up. He has been taking ibuprofen 800 mg intermittently with minimal relief.  MRI of the lumbar spine obtained that demonstrated new large left central disc extrusion at the L2-3 with 2.7 cm caudal migration to the L3 inferior endplate resulting in severe spinal canal stenosis. Neurosurgery consulted. [facility] contacted for admission.    Assessment and Plan:  # Lumbar spinal stenosis S/p Decadron 10 mg IV injection, continue prednisone 60 mg p.o. daily for now, we will plan to gradually taper down Decreased to pantoprazole 60 mg p.o. 3 times daily, Tylenol 1000 mg p.o. 3 times daily Oxycodone 5 mg p.o. 6 hourly IV Dilaudid as needed for pectoral pain PPI for GI prophylaxis Neurosurgery consulted, Plavix has been on hold.  Patient will need laminectomy versus epidural injection.  Laminectomy cannot be done till Monday due to Plavix, if patient cannot wait then that is the definitive procedure otherwise epidural injection can be done then patient has to wait for a few weeks. Continue PT and OT eval We will follow neurosurgery for further recommendation    # CAD s/p DES to LAD and RCA. , HTN, HLD Held Plavix for possibility of laminectomy and epidural injection,  patient took last dose on Tuesday Continued aspirin Continue Coreg, lisinopril, spironolactone, and Crestor Home medications Monitor BP and titrate medication accordingly   Prediabetic, hemoglobin A1c 6.3 Hyperglycemia could be secondary to steroids continue diabetic diet Continue NovoLog sliding scale Monitor CBG   Morbid obesity: Body mass index is 52.62 kg/m.  Interventions: Calorie restricted diet and daily exercise advised to lose body weight.  Lifestyle modification advised.       Diet: Carb modified and heart healthy diet DVT Prophylaxis: Subcutaneous Lovenox   Advance goals of care discussion: Full code  Family Communication: family was not present at bedside, at the time of interview.  The pt provided permission to discuss medical plan with the family. Opportunity was given to ask question and all questions were answered satisfactorily.   Disposition:  Pt is from Home, admitted with intractable back pain due to lumbar spinal stenosis, still has significant pain, which precludes a safe discharge. Discharge to home, when stable and cleared by neurosurgery.  Subjective: Significant events overnight, patient was seen in the morning time after working with physical therapy at that time patient was in severe pain 10 out of 10.  Patient denies any other complaint,, no shortness of breath, no chest pain or palpitations.   Physical Exam: General: Patient was lying in bed, complaining of severe pain after working with PT Appear in moderate distress due to pain. Eyes: PERRLA ENT: Oral Mucosa Clear, moist  Neck: no JVD,  Cardiovascular: S1 and S2 Present, no Murmur,  Respiratory: good  respiratory effort, Bilateral Air entry equal and Decreased, no Crackles, no wheezes Abdomen: Bowel Sound present, Soft and no tenderness,  Skin: no rashes Extremities: no Pedal edema, no calf tenderness Neurologic: without any new focal findings Gait not checked due to patient safety  concerns  Vitals:   10/04/22 2000 10/04/22 2043 10/05/22 0200 10/05/22 0817  BP: (!) 140/101 (!) 149/83  (!) 157/92  Pulse: 84 89  (!) 59  Resp: '18 20  18  '$ Temp: 98.4 F (36.9 C) 98.4 F (36.9 C)  97.6 F (36.4 C)  TempSrc:      SpO2: 93% 95% 95% 93%  Weight:      Height:        Intake/Output Summary (Last 24 hours) at 10/05/2022 1308 Last data filed at 10/05/2022 0900 Gross per 24 hour  Intake 240 ml  Output 550 ml  Net -310 ml   Filed Weights   10/04/22 1024  Weight: (!) 168.7 kg    Data Reviewed: I have personally reviewed and interpreted daily labs, tele strips, imagings as discussed above. I reviewed all nursing notes, pharmacy notes, vitals, pertinent old records I have discussed plan of care as described above with RN and patient/family.  CBC: Recent Labs  Lab 10/04/22 1029 10/05/22 0415  WBC 15.6* 13.8*  NEUTROABS 13.3*  --   HGB 16.7 15.8  HCT 51.5 48.6  MCV 78.0* 78.0*  PLT 401* [id number]   Basic Metabolic Panel: Recent Labs  Lab 10/04/22 1029 10/05/22 0415  NA 131* 134*  K 4.0 4.6  CL 100 101  CO2 20* 24  GLUCOSE 220* 166*  BUN 30* 39*  CREATININE 0.92 0.79  CALCIUM 8.9 8.7*    Studies: CT ABDOMEN PELVIS W CONTRAST  Result Date: 10/04/2022 CLINICAL DATA:  Abdominal pain, acute nonlocalized. Back pain for 3 weeks which has progressively worsened. Groin pain radiating into the left leg. EXAM: CT ABDOMEN AND PELVIS WITH CONTRAST TECHNIQUE: Multidetector CT imaging of the abdomen and pelvis was performed using the standard protocol following bolus administration of intravenous contrast. RADIATION DOSE REDUCTION: This exam was performed according to the departmental dose-optimization program which includes automated exposure control, adjustment of the mA and/or kV according to patient size and/or use of iterative reconstruction technique. CONTRAST:  11m OMNIPAQUE IOHEXOL 350 MG/ML SOLN COMPARISON:  Scrotal ultrasound and lumbar MRI same date. FINDINGS:  Lower chest: Clear lung bases. No significant pleural or pericardial effusion. Hepatobiliary: Decreased density throughout the liver consistent with steatosis. No focal lesion or abnormal enhancement identified. No evidence of gallstones, gallbladder wall thickening or biliary dilatation. Pancreas: Unremarkable. No pancreatic ductal dilatation or surrounding inflammatory changes. Spleen: Normal in size without focal abnormality. Adrenals/Urinary Tract: Both adrenal glands appear normal. Punctate nonobstructing calculus in the lower pole of the left kidney, best seen on coronal image 74/5. No other evidence of urinary tract calculus, hydronephrosis or suspicious renal lesion. There is mild symmetric perinephric soft tissue stranding bilaterally which is nonspecific. There are 2 tiny low-density right renal lesions consistent with Bosniak 2 cysts; no follow-up imaging recommended. The bladder appears unremarkable for its degree of distention. Stomach/Bowel: No enteric contrast administered. The stomach appears unremarkable for its degree of distension. No evidence of bowel wall thickening, distention or surrounding inflammatory change. The appendix appears normal. There are diverticular changes in the sigmoid colon without evidence of acute inflammation. Vascular/Lymphatic: There are no enlarged abdominal or pelvic lymph nodes. Aortic and branch vessel atherosclerosis without evidence of aneurysm or large vessel occlusion. Reproductive:  The prostate gland and seminal vesicles appear normal. Other: No evidence of abdominal wall mass or hernia. No ascites. Musculoskeletal: No acute or significant osseous findings. Lower lumbar spondylosis, greatest at L5-S1. Left sided disc extrusion at L2-3, better seen on earlier MRI. IMPRESSION: 1. No acute findings or explanation for the patient's symptoms. 2. Punctate nonobstructing left renal calculus. No evidence of ureteral calculus or hydronephrosis. 3. Hepatic steatosis. 4.  Sigmoid diverticulosis without evidence of acute inflammation. 5. See lumbar MRI report. 6.  Aortic Atherosclerosis (ICD10-I70.0). Electronically Signed   By: Richardean Sale M.D.   On: 10/04/2022 13:34   CT HEAD WO CONTRAST (5MM)  Result Date: 10/04/2022 CLINICAL DATA:  Head trauma.  Mental status changes. EXAM: CT HEAD WITHOUT CONTRAST TECHNIQUE: Contiguous axial images were obtained from the base of the skull through the vertex without intravenous contrast. RADIATION DOSE REDUCTION: This exam was performed according to the departmental dose-optimization program which includes automated exposure control, adjustment of the mA and/or kV according to patient size and/or use of iterative reconstruction technique. COMPARISON:  06/22/2021 FINDINGS: Brain: There is no evidence for acute hemorrhage, hydrocephalus, mass lesion, or abnormal extra-axial fluid collection. No definite CT evidence for acute infarction. Vascular: No hyperdense vessel or unexpected calcification. Skull: Normal. Negative for fracture or focal lesion. Sinuses/Orbits: No acute finding. Other: None. IMPRESSION: No acute intracranial abnormality. Electronically Signed   By: Misty Stanley M.D.   On: 10/04/2022 13:29    Scheduled Meds:  acetaminophen  1,000 mg Oral TID   aspirin EC  81 mg Oral Daily   carvedilol  25 mg Oral BID   enoxaparin (LOVENOX) injection  85 mg Subcutaneous Q24H   ibuprofen  800 mg Oral TID   insulin aspart  0-15 Units Subcutaneous TID WC   lisinopril  40 mg Oral Daily   predniSONE  60 mg Oral Daily   rosuvastatin  10 mg Oral Daily   spironolactone  25 mg Oral Daily   Continuous Infusions: PRN Meds: hydrALAZINE **OR** hydrALAZINE, HYDROmorphone (DILAUDID) injection, hydrOXYzine, methocarbamol, ondansetron **OR** ondansetron (ZOFRAN) IV, oxyCODONE, polyethylene glycol  Time spent: 35 minutes  Author: Val Riles. MD [facility] 10/05/2022 1:08 PM  To reach On-call, see care teams to locate the  attending and reach out to them via [email or web address]. If 7PM-7AM, please contact night-coverage If you still have difficulty reaching the attending provider, please page the [facility] (Director on Call) for [facility] on amion for assistance.

## 2022-10-06 ENCOUNTER — Inpatient Hospital Stay: Payer: 59 | Admitting: Radiology

## 2022-10-06 DIAGNOSIS — M48061 Spinal stenosis, lumbar region without neurogenic claudication: Secondary | ICD-10-CM | POA: Diagnosis not present

## 2022-10-06 HISTORY — PX: IR INJECT/THERA/INC NEEDLE/CATH/PLC EPI/LUMB/SAC W/IMG: IMG6130

## 2022-10-06 LAB — CBC
HCT: 49.3 % (ref 39.0–52.0)
Hemoglobin: 15.8 g/dL (ref 13.0–17.0)
MCH: 25.5 pg — ABNORMAL LOW (ref 26.0–34.0)
MCHC: 32 g/dL (ref 30.0–36.0)
MCV: 79.6 fL — ABNORMAL LOW (ref 80.0–100.0)
Platelets: 335 10*3/uL (ref 150–400)
RBC: 6.19 MIL/uL — ABNORMAL HIGH (ref 4.22–5.81)
RDW: 18 % — ABNORMAL HIGH (ref 11.5–15.5)
WBC: 13.5 10*3/uL — ABNORMAL HIGH (ref 4.0–10.5)
nRBC: 0 % (ref 0.0–0.2)

## 2022-10-06 LAB — BASIC METABOLIC PANEL
Anion gap: 13 (ref 5–15)
BUN: 44 mg/dL — ABNORMAL HIGH (ref 6–20)
CO2: 24 mmol/L (ref 22–32)
Calcium: 9.2 mg/dL (ref 8.9–10.3)
Chloride: 98 mmol/L (ref 98–111)
Creatinine, Ser: 0.93 mg/dL (ref 0.61–1.24)
GFR, Estimated: >60 mL/min (ref >60)
Glucose, Bld: 140 mg/dL — ABNORMAL HIGH (ref 70–99)
Potassium: 4.3 mmol/L (ref 3.5–5.1)
Sodium: 135 mmol/L (ref 135–145)

## 2022-10-06 LAB — GLUCOSE, CAPILLARY
Glucose-Capillary: 135 mg/dL — ABNORMAL HIGH (ref 70–99)
Glucose-Capillary: 245 mg/dL — ABNORMAL HIGH (ref 70–99)
Glucose-Capillary: 175 mg/dL — ABNORMAL HIGH (ref 70–99)
Glucose-Capillary: 164 mg/dL — ABNORMAL HIGH (ref 70–99)

## 2022-10-06 LAB — PHOSPHORUS: Phosphorus: 4 mg/dL (ref 2.5–4.6)

## 2022-10-06 LAB — HEMOGLOBIN A1C
Hgb A1c MFr Bld: 7.9 % — ABNORMAL HIGH (ref 4.8–5.6)
Mean Plasma Glucose: 180 mg/dL

## 2022-10-06 LAB — MAGNESIUM: Magnesium: 2.5 mg/dL — ABNORMAL HIGH (ref 1.7–2.4)

## 2022-10-06 MED ORDER — IOHEXOL 180 MG/ML  SOLN
1.0000 mL | Freq: Once | INTRAMUSCULAR | Status: AC | PRN
  Administered 2022-10-06: 1 mL via INTRATHECAL

## 2022-10-06 MED ORDER — METHYLPREDNISOLONE ACETATE 80 MG/ML IJ SUSP
INTRAMUSCULAR | Status: AC
  Administered 2022-10-06: 80 mg
  Filled 2022-10-06: qty 1

## 2022-10-06 MED ORDER — LIDOCAINE HCL (PF) 1 % IJ SOLN
INTRAMUSCULAR | Status: AC
  Administered 2022-10-06: 5 mL
  Filled 2022-10-06: qty 30

## 2022-10-06 MED ORDER — OXYCODONE HCL 5 MG PO TABS
10.0000 mg | ORAL_TABLET | Freq: Four times a day (QID) | ORAL | Status: DC | PRN
  Administered 2022-10-07 – 2022-10-10 (×11): 10 mg via ORAL
  Filled 2022-10-06 (×12): qty 2

## 2022-10-06 NOTE — Progress Notes (Signed)
Occupational Therapy Treatment Patient Details Name: Steve Burnett MRN: [id number] DOB: Jan 13, 1961 Today's Date: 10/06/2022   History of present illness Pt is a 62 y/o M admitted on 10/04/22 after presenting with c/o lower back pain.  MRI of the lumbar spine obtained that demonstrated new large left central disc extrusion at the L2-3 with 2.7 cm caudal migration to the L3 inferior endplate resulting in severe spinal canal stenosis. PMH: CAD s/p DES to LAD, RCA, HFrEF, OSA, paroxysmal a-fib, HTN, HLD   OT comments  Upon entering the room, pt supine in bed and and reports having significant pain event after returning to room from steroid injection. Pt needing encouragement to participate. Pt performed log roll to exit bed with close supervision and after resting briefly on EOB he stands and ambulates with RW and supervision 40'. Pt returning to sit on EOB and returns to supine. Pt expresses frustration in regards to pain management and OT provided therapeutic use of self and education. Pt able to elevate HOB to 40 degrees and tolerate at end of session. Call bell and all needed items within reach upon exiting the room.    Recommendations for follow up therapy are one component of a multi-disciplinary discharge planning process, led by the attending physician.  Recommendations may be updated based on patient status, additional functional criteria and insurance authorization.    Follow Up Recommendations  No OT follow up     Assistance Recommended at Discharge Intermittent Supervision/Assistance  Patient can return home with the following  A little help with walking and/or transfers;A little help with bathing/dressing/bathroom;Assistance with cooking/housework;Assist for transportation;Help with stairs or ramp for entrance   Equipment Recommendations  None recommended by OT       Precautions / Restrictions Precautions Precautions: Back Precaution Booklet Issued: Yes (comment) Precaution  Comments: No twisting, bending, sitting too long Restrictions Weight Bearing Restrictions: No       Mobility Bed Mobility Overal bed mobility: Needs Assistance Bed Mobility: Sidelying to Sit, Sit to Sidelying   Sidelying to sit: Supervision     Sit to sidelying: Supervision      Transfers Overall transfer level: Needs assistance Equipment used: None Transfers: Sit to/from Stand Sit to Stand: Supervision                 Balance Overall balance assessment: Mild deficits observed, not formally tested                                         ADL either performed or assessed with clinical judgement    Extremity/Trunk Assessment Upper Extremity Assessment Upper Extremity Assessment: Overall WFL for tasks assessed   Lower Extremity Assessment Lower Extremity Assessment: Overall WFL for tasks assessed        Vision Patient Visual Report: No change from baseline            Cognition Arousal/Alertness: Awake/alert Behavior During Therapy: WFL for tasks assessed/performed Overall Cognitive Status: Within Functional Limits for tasks assessed                                                General Comments  (Modified session with focus on education regarding disc herniation, pdf given with info on epidural Steroid injection and after care to assist  with pt understanding possible side effects.)    Pertinent Vitals/ Pain       Pain Assessment Pain Assessment: 0-10 Pain Score: 3  Pain Location: back Pain Descriptors / Indicators: Grimacing, Guarding, Discomfort Pain Intervention(s): Limited activity within patient's tolerance, Premedicated before session, Monitored during session, Repositioned         Frequency  Min 2X/week        Progress Toward Goals  OT Goals(current goals can now be found in the care plan section)  Progress towards OT goals: Progressing toward goals     Plan Discharge plan remains  appropriate;Frequency remains appropriate       AM-PAC OT "6 Clicks" Daily Activity     Outcome Measure   Help from another person eating meals?: None Help from another person taking care of personal grooming?: None Help from another person toileting, which includes using toliet, bedpan, or urinal?: A Little Help from another person bathing (including washing, rinsing, drying)?: A Little Help from another person to put on and taking off regular upper body clothing?: None Help from another person to put on and taking off regular lower body clothing?: A Little 6 Click Score: 21    End of Session Equipment Utilized During Treatment: Rolling walker (2 wheels)  OT Visit Diagnosis: Unsteadiness on feet (R26.81);Muscle weakness (generalized) (M62.81);Pain   Activity Tolerance Patient limited by pain   Patient Left in bed;with call bell/phone within reach;with bed alarm set   Nurse Communication Mobility status        Time: [id number] OT Time Calculation (min): 27 min  Charges: OT General Charges $OT Visit: 1 Visit OT Treatments $Therapeutic Activity: 23-37 mins  Darleen Crocker, MS, OTR/L , CBIS ascom [phone]  10/06/22, 3:00 PM

## 2022-10-06 NOTE — Progress Notes (Addendum)
Pt returned from receiving ESI to lumbar region ~1 hour ago. Experienced sudden severe onset of Left LE pain and cramping which slowly resolved with meds and repositioning. Pt afraid to mobilize at time of visit. Education provided on ESI procedure and post care. Will try back again after lunch since pt is unable to tolerate movement at this time.  Addendum: Therapist returned to observe pt ambulating ~53ft with RW and OT. Poor tolerance for further activity. Will plan to see pt tomorrow to assess mobility tolerance and stairs.   10/06/22 1200  PT Visit Information  Assistance Needed +1  History of Present Illness Pt is a 62 y/o M admitted on 10/04/22 after presenting with c/o lower back pain.  MRI of the lumbar spine obtained that demonstrated new large left central disc extrusion at the L2-3 with 2.7 cm caudal migration to the L3 inferior endplate resulting in severe spinal canal stenosis. PMH: CAD s/p DES to LAD, RCA, HFrEF, OSA, paroxysmal a-fib, HTN, HLD  Subjective Data  Subjective I just experienced the worst cramping pain ever in my Left leg  Patient Stated Goal decreased pain  Precautions  Precautions Back  Precaution Booklet Issued Yes (comment)  Precaution Comments No twisting, bending, sitting too long  Restrictions  Weight Bearing Restrictions No  Pain Assessment  Pain Assessment 0-10  Pain Score 3  Pain Location back  Pain Descriptors / Indicators Grimacing;Guarding;Discomfort  Pain Intervention(s) Limited activity within patient's tolerance  Cognition  Arousal/Alertness Awake/alert  Behavior During Therapy WFL for tasks assessed/performed  Overall Cognitive Status Within Functional Limits for tasks assessed  Bed Mobility  General bed mobility comments  (unable to tolerate)  General Comments  General comments (skin integrity, edema, etc.)  (Modified session with focus on education regarding disc herniation, pdf given with info on epidural Steroid injection and after care  to assist with pt understanding possible side effects.)  PT - End of Session  Activity Tolerance Patient limited by pain  Patient left in bed;with call bell/phone within reach;with bed alarm set  Nurse Communication Patient requests pain meds   PT - Assessment/Plan  PT Visit Diagnosis Pain;Difficulty in walking, not elsewhere classified (R26.2)  Pain - part of body  (lower back, Right LE)  PT Frequency (ACUTE ONLY) 7X/week  Follow Up Recommendations Outpatient PT  Assistance recommended at discharge PRN  Patient can return home with the following Assistance with cooking/housework;Assist for transportation;Help with stairs or ramp for entrance  PT equipment Rolling walker (2 wheels)  AM-PAC PT "6 Clicks" Mobility Outcome Measure (Version 2)  Help needed turning from your back to your side while in a flat bed without using bedrails? 3  Help needed moving from lying on your back to sitting on the side of a flat bed without using bedrails? 3  Help needed moving to and from a bed to a chair (including a wheelchair)? 3  Help needed standing up from a chair using your arms (e.g., wheelchair or bedside chair)? 3  Help needed to walk in hospital room? 3  Help needed climbing 3-5 steps with a railing?  2  6 Click Score 17  Consider Recommendation of Discharge To: Home with [facility]  Progressive Mobility  What is the highest level of mobility based on the progressive mobility assessment? Level 4 (Walks with assist in room) - Balance while marching in place and cannot step forward and back - Complete  PT Time Calculation  PT Start Time (ACUTE ONLY) 1230  PT Stop Time (ACUTE ONLY)  1245  PT Time Calculation (min) (ACUTE ONLY) 15 min  PT General Charges  $$ ACUTE PT VISIT 1 Visit  PT Treatments  $Therapeutic Activity 8-22 mins   Mikel Cella, PTA

## 2022-10-06 NOTE — Procedures (Signed)
Interventional Radiology Procedure Note  Procedure: L2/3 epidural steroid injection  Findings: Please refer to procedural dictation for full description.  Complications: None immediate  Estimated Blood Loss: < 5 mL  Recommendations: None specific.     Ruthann Cancer, MD

## 2022-10-06 NOTE — Progress Notes (Signed)
[facility] Progress Note  Patient: Steve Burnett    [id number]  DOA: 10/04/2022     Date of Service: the patient was seen and examined on 10/06/2022  Chief Complaint  Patient presents with   Back Pain   Groin Pain   Brief hospital course: Steve Burnett is a 62 y.o. male with medical history significant of CAD s/p DES to LAD (2013) and RCA (2019), HFrEF with last EF of 45-50%, OSA, paroxysmal A-fib, hypertension, hyperlipidemia, who presents to the ED due to lower back pain. He did lift heavy objects approximately 2 weeks before onset but he did not have pain then. Pain initially came on gradually before suddenly exacerbating while lifting an object at work. He notes that pain is exacerbated by laying flat and trying to stand up. He has been taking ibuprofen 800 mg intermittently with minimal relief.  MRI of the lumbar spine obtained that demonstrated new large left central disc extrusion at the L2-3 with 2.7 cm caudal migration to the L3 inferior endplate resulting in severe spinal canal stenosis. Neurosurgery consulted. [facility] contacted for admission.    Assessment and Plan:  # Lumbar spinal stenosis S/p Decadron 10 mg IV injection, continue prednisone 60 mg p.o. daily for now, we will plan to gradually taper down Decreased to pantoprazole 60 mg p.o. 3 times daily, Tylenol 1000 mg p.o. 3 times daily Oxycodone 5 mg p.o. 6 hourly IV Dilaudid as needed for pectoral pain PPI for GI prophylaxis Neurosurgery consulted, Plavix has been on hold.  Patient will need laminectomy versus epidural injection.  Laminectomy cannot be done till Monday due to Plavix, if patient cannot wait then that is the definitive procedure otherwise epidural injection can be done then patient has to wait for a few weeks. Continue PT and OT eval 3/15 ESI (epidural steroid injection) was done by IR, requested by neurosurgery.   # CAD s/p DES to LAD and RCA. , HTN, HLD Held Plavix for possibility of laminectomy  and epidural injection, patient took last dose on Tuesday Continued aspirin Continue Coreg, lisinopril, spironolactone, and Crestor Home medications Monitor BP and titrate medication accordingly   Prediabetic, hemoglobin A1c 6.3 Hyperglycemia could be secondary to steroids continue diabetic diet Continue NovoLog sliding scale Monitor CBG   Morbid obesity: Body mass index is 52.62 kg/m.  Interventions: Calorie restricted diet and daily exercise advised to lose body weight.  Lifestyle modification advised.       Diet: Carb modified and heart healthy diet DVT Prophylaxis: Subcutaneous Lovenox   Advance goals of care discussion: Full code  Family Communication: family was not present at bedside, at the time of interview.  The pt provided permission to discuss medical plan with the family. Opportunity was given to ask question and all questions were answered satisfactorily.   Disposition:  Pt is from Home, admitted with intractable back pain due to lumbar spinal stenosis, still has significant pain, which precludes a safe discharge. Discharge to home, when stable and cleared by neurosurgery.  Subjective: No significant events overnight.  Patient was seen after epidural steroid injection and he was in excruciating pain and screaming.  After some repositioning patient's pain was better, patient received IV Dilaudid for pain control.  Patient was not in a condition to describe anything else.   Physical Exam: General: Seen after ESI was in excruciating pain and screaming.   Appear in severe distress due to pain. Eyes: PERRLA ENT: Oral Mucosa Clear, moist  Neck: no JVD,  Cardiovascular: S1 and S2  Present, no Murmur,  Respiratory: good respiratory effort, Bilateral Air entry equal and Decreased, no Crackles, no wheezes Abdomen: Bowel Sound present, Soft and no tenderness,  Skin: no rashes Extremities: no Pedal edema, no calf tenderness Neurologic: without any new focal  findings Gait not checked due to patient safety concerns  Vitals:   10/05/22 2106 10/06/22 0005 10/06/22 0737 10/06/22 1038  BP: (!) 144/100 108/72 (!) 125/91 129/82  Pulse: 66 66 67 62  Resp: 18 20 18 18   Temp: 98.1 F (36.7 C) 98.1 F (36.7 C) 97.6 F (36.4 C) 98.1 F (36.7 C)  TempSrc:   Oral Oral  SpO2: 96% 96% 96% 98%  Weight:      Height:        Intake/Output Summary (Last 24 hours) at 10/06/2022 1517 Last data filed at 10/06/2022 1422 Gross per 24 hour  Intake --  Output 1900 ml  Net -1900 ml   Filed Weights   10/04/22 1024  Weight: (!) 168.7 kg    Data Reviewed: I have personally reviewed and interpreted daily labs, tele strips, imagings as discussed above. I reviewed all nursing notes, pharmacy notes, vitals, pertinent old records I have discussed plan of care as described above with RN and patient/family.  CBC: Recent Labs  Lab 10/04/22 1029 10/05/22 0415 10/06/22 0559  WBC 15.6* 13.8* 13.5*  NEUTROABS 13.3*  --   --   HGB 16.7 15.8 15.8  HCT 51.5 48.6 49.3  MCV 78.0* 78.0* 79.6*  PLT 401* [id number]   Basic Metabolic Panel: Recent Labs  Lab 10/04/22 1029 10/05/22 0415 10/06/22 0559  NA 131* 134* 135  K 4.0 4.6 4.3  CL 100 101 98  CO2 20* 24 24  GLUCOSE 220* 166* 140*  BUN 30* 39* 44*  CREATININE 0.92 0.79 0.93  CALCIUM 8.9 8.7* 9.2  MG  --   --  2.5*  PHOS  --   --  4.0    Studies: IR INJECT DIAG/THERA/INC NEEDLE/CATH/PLC EPI/LUMB/SAC W/IMG  Result Date: 10/06/2022 CLINICAL DATA:  62 year old male with history of back pain and MRI findings of large L2-L3 disc extrusion. FLUOROSCOPY: Radiation Exposure Index (as provided by the fluoroscopic device): 172 mGy Kerma PROCEDURE: The procedure, risks, benefits, and alternatives were explained to the patient. Questions regarding the procedure were encouraged and answered. The patient understands and consents to the procedure. LUMBAR EPIDURAL INJECTION: An interlaminar approach was performed on left  at L2-L3. The overlying skin was cleansed and anesthetized. A 3.5 inch 20 gauge epidural needle was advanced using loss-of-resistance technique. DIAGNOSTIC EPIDURAL INJECTION: Injection of Isovue-M 200 shows a good epidural pattern with spread above and below the level of needle placement, primarily on the left no vascular opacification is seen. THERAPEUTIC EPIDURAL INJECTION: Eighty mg of Depo-Medrol mixed with 2 mL 1% lidocaine were instilled. The procedure was well-tolerated and the patient was returned to his inpatient room in good condition. COMPLICATIONS: None immediate IMPRESSION: Technically successful L2-L3 epidural steroid injection at L2-L3. Ruthann Cancer, MD [facility] Radiology Electronically Signed   By: Ruthann Cancer M.D.   On: 10/06/2022 15:13    Scheduled Meds:  acetaminophen  1,000 mg Oral TID   aspirin EC  81 mg Oral Daily   bisacodyl  10 mg Oral QHS   carvedilol  25 mg Oral BID   enoxaparin (LOVENOX) injection  85 mg Subcutaneous Q24H   ibuprofen  600 mg Oral TID   insulin aspart  0-15 Units Subcutaneous TID  WC   lisinopril  40 mg Oral Daily   pantoprazole  40 mg Oral BID   polyethylene glycol  17 g Oral BID   predniSONE  60 mg Oral Daily   rosuvastatin  10 mg Oral Daily   spironolactone  25 mg Oral Daily   Continuous Infusions: PRN Meds: hydrALAZINE **OR** hydrALAZINE, HYDROmorphone (DILAUDID) injection, hydrOXYzine, methocarbamol, ondansetron **OR** ondansetron (ZOFRAN) IV, oxyCODONE  Time spent: 35 minutes  Author: Val Riles. MD [facility] 10/06/2022 3:17 PM  To reach On-call, see care teams to locate the attending and reach out to them via [email or web address]. If 7PM-7AM, please contact night-coverage If you still have difficulty reaching the attending provider, please page the [facility] (Director on Call) for [facility] on amion for assistance.

## 2022-10-07 DIAGNOSIS — M48061 Spinal stenosis, lumbar region without neurogenic claudication: Secondary | ICD-10-CM | POA: Diagnosis not present

## 2022-10-07 LAB — BASIC METABOLIC PANEL
Anion gap: 8 (ref 5–15)
BUN: 36 mg/dL — ABNORMAL HIGH (ref 6–20)
CO2: 23 mmol/L (ref 22–32)
Calcium: 8.3 mg/dL — ABNORMAL LOW (ref 8.9–10.3)
Chloride: 102 mmol/L (ref 98–111)
Creatinine, Ser: 0.82 mg/dL (ref 0.61–1.24)
GFR, Estimated: >60 mL/min (ref >60)
Glucose, Bld: 167 mg/dL — ABNORMAL HIGH (ref 70–99)
Potassium: 4.2 mmol/L (ref 3.5–5.1)
Sodium: 133 mmol/L — ABNORMAL LOW (ref 135–145)

## 2022-10-07 LAB — CBC
HCT: 49 % (ref 39.0–52.0)
Hemoglobin: 15.6 g/dL (ref 13.0–17.0)
MCH: 25.5 pg — ABNORMAL LOW (ref 26.0–34.0)
MCHC: 31.8 g/dL (ref 30.0–36.0)
MCV: 80.1 fL (ref 80.0–100.0)
Platelets: 262 10*3/uL (ref 150–400)
RBC: 6.12 MIL/uL — ABNORMAL HIGH (ref 4.22–5.81)
RDW: 17.8 % — ABNORMAL HIGH (ref 11.5–15.5)
WBC: 10.1 10*3/uL (ref 4.0–10.5)
nRBC: 0 % (ref 0.0–0.2)

## 2022-10-07 LAB — GLUCOSE, CAPILLARY
Glucose-Capillary: 123 mg/dL — ABNORMAL HIGH (ref 70–99)
Glucose-Capillary: 181 mg/dL — ABNORMAL HIGH (ref 70–99)
Glucose-Capillary: 172 mg/dL — ABNORMAL HIGH (ref 70–99)
Glucose-Capillary: 143 mg/dL — ABNORMAL HIGH (ref 70–99)

## 2022-10-07 LAB — MAGNESIUM: Magnesium: 2.4 mg/dL (ref 1.7–2.4)

## 2022-10-07 LAB — PHOSPHORUS: Phosphorus: 3 mg/dL (ref 2.5–4.6)

## 2022-10-07 MED ORDER — CLOPIDOGREL BISULFATE 75 MG PO TABS
75.0000 mg | ORAL_TABLET | Freq: Every day | ORAL | Status: DC
  Administered 2022-10-07 – 2022-10-10 (×4): 75 mg via ORAL
  Filled 2022-10-07 (×4): qty 1

## 2022-10-07 MED ORDER — GABAPENTIN 100 MG PO CAPS
100.0000 mg | ORAL_CAPSULE | Freq: Three times a day (TID) | ORAL | Status: DC
  Administered 2022-10-07 – 2022-10-10 (×9): 100 mg via ORAL
  Filled 2022-10-07 (×9): qty 1

## 2022-10-07 MED ORDER — METHOCARBAMOL 500 MG PO TABS
500.0000 mg | ORAL_TABLET | Freq: Three times a day (TID) | ORAL | Status: DC
  Administered 2022-10-07 – 2022-10-10 (×9): 500 mg via ORAL
  Filled 2022-10-07 (×9): qty 1

## 2022-10-07 NOTE — Progress Notes (Signed)
[facility] Progress Note  Patient: Steve Burnett    [id number]  DOA: 10/04/2022     Date of Service: the patient was seen and examined on 10/07/2022  Chief Complaint  Patient presents with   Back Pain   Groin Pain   Brief hospital course: Steve Burnett is a 62 y.o. male with medical history significant of CAD s/p DES to LAD (2013) and RCA (2019), HFrEF with last EF of 45-50%, OSA, paroxysmal A-fib, hypertension, hyperlipidemia, who presents to the ED due to lower back pain. He did lift heavy objects approximately 2 weeks before onset but he did not have pain then. Pain initially came on gradually before suddenly exacerbating while lifting an object at work. He notes that pain is exacerbated by laying flat and trying to stand up. He has been taking ibuprofen 800 mg intermittently with minimal relief.  MRI of the lumbar spine obtained that demonstrated new large left central disc extrusion at the L2-3 with 2.7 cm caudal migration to the L3 inferior endplate resulting in severe spinal canal stenosis. Neurosurgery consulted. [facility] contacted for admission.    Assessment and Plan:  # Lumbar spinal stenosis without cauda equina syndrome  S/p Decadron 10 mg IV injection, continue prednisone 60 mg p.o. daily for now, we will plan to gradually taper down Decreased ibuprofen 600 mg p.o. 3 times daily, Tylenol 1000 mg p.o. 3 times daily Increased oxycodone 10 mg p.o. 6 hourly IV Dilaudid as needed for pectoral pain PPI for GI prophylaxis Neurosurgery consulted, Plavix was held for possibility of laminectomy vs epidural injection.   Laminectomy could be done till Monday, 3/18 due to Plavix, but patient was on severe intractable pain so he could not wait, patient proceeded with epidural steroid injection which was done on 3/15 by IR.  Neurosurgery will delay the laminectomy for few weeks.  Recommended to continue Plavix for now.     # CAD s/p DES to LAD and RCA. , HTN, HLD Continue aspirin,  Coreg, lisinopril, spironolactone, and Crestor Home medications Plavix was held for possibility of laminectomy and epidural injection.  Patient got epidural steroid injection on 3/15, resumed Plavix on 3/16, cleared by IR and neurosurgery. Monitor BP and titrate medication accordingly   Prediabetic, hemoglobin A1c 6.3 Hyperglycemia could be secondary to steroids continue diabetic diet Continue NovoLog sliding scale Monitor CBG   Morbid obesity: Body mass index is 52.62 kg/m.  Interventions: Calorie restricted diet and daily exercise advised to lose body weight.  Lifestyle modification advised.       Diet: Carb modified and heart healthy diet DVT Prophylaxis: Subcutaneous Lovenox   Advance goals of care discussion: Full code  Family Communication: family was not present at bedside, at the time of interview.  The pt provided permission to discuss medical plan with the family. Opportunity was given to ask question and all questions were answered satisfactorily.   Disposition:  Pt is from Home, admitted with intractable back pain due to lumbar spinal stenosis, still has significant pain, which precludes a safe discharge. Discharge to home, when stable and cleared by neurosurgery.  Subjective: No significant events overnight except 1 episode of severe pain which has resolved.  In the morning time patient was having pain 3/10 but he stated that sometimes the pain really goes bed 10 out of 10.  Patient is trying to ambulate as well but is still requiring a lot of pain medication so he does not feel stable to go home today.  We will plan to  discharge him tomorrow a.m.    Physical Exam: General: NAD, laying comfortably,  Eyes: PERRLA ENT: Oral Mucosa Clear, moist  Neck: no JVD,  Cardiovascular: S1 and S2 Present, no Murmur,  Respiratory: good respiratory effort, Bilateral Air entry equal and Decreased, no Crackles, no wheezes Abdomen: Bowel Sound present, Soft and no tenderness,   Skin: no rashes Extremities: no Pedal edema, no calf tenderness Neurologic: without any new focal findings Gait not checked due to patient safety concerns  Vitals:   10/06/22 1038 10/06/22 1637 10/06/22 2136 10/06/22 2328  BP: 129/82 (!) 148/98 (!) 191/106 (!) 158/98  Pulse: 62 64 75 67  Resp: 18 19 15 15   Temp: 98.1 F (36.7 C) 97.6 F (36.4 C) 98.1 F (36.7 C) 98 F (36.7 C)  TempSrc: Oral     SpO2: 98% 97% 97% 97%  Weight:      Height:        Intake/Output Summary (Last 24 hours) at 10/07/2022 1435 Last data filed at 10/06/2022 2141 Gross per 24 hour  Intake --  Output 1075 ml  Net -1075 ml   Filed Weights   10/04/22 1024  Weight: (!) 168.7 kg    Data Reviewed: I have personally reviewed and interpreted daily labs, tele strips, imagings as discussed above. I reviewed all nursing notes, pharmacy notes, vitals, pertinent old records I have discussed plan of care as described above with RN and patient/family.  CBC: Recent Labs  Lab 10/04/22 1029 10/05/22 0415 10/06/22 0559 10/07/22 0505  WBC 15.6* 13.8* 13.5* 10.1  NEUTROABS 13.3*  --   --   --   HGB 16.7 15.8 15.8 15.6  HCT 51.5 48.6 49.3 49.0  MCV 78.0* 78.0* 79.6* 80.1  PLT 401* 359 335 [id number]   Basic Metabolic Panel: Recent Labs  Lab 10/04/22 1029 10/05/22 0415 10/06/22 0559 10/07/22 0505  NA 131* 134* 135 133*  K 4.0 4.6 4.3 4.2  CL 100 101 98 102  CO2 20* 24 24 23   GLUCOSE 220* 166* 140* 167*  BUN 30* 39* 44* 36*  CREATININE 0.92 0.79 0.93 0.82  CALCIUM 8.9 8.7* 9.2 8.3*  MG  --   --  2.5* 2.4  PHOS  --   --  4.0 3.0    Studies: No results found.  Scheduled Meds:  acetaminophen  1,000 mg Oral TID   aspirin EC  81 mg Oral Daily   bisacodyl  10 mg Oral QHS   carvedilol  25 mg Oral BID   clopidogrel  75 mg Oral Daily   enoxaparin (LOVENOX) injection  85 mg Subcutaneous Q24H   ibuprofen  600 mg Oral TID   insulin aspart  0-15 Units Subcutaneous TID WC   lisinopril  40 mg Oral Daily    pantoprazole  40 mg Oral BID   polyethylene glycol  17 g Oral BID   predniSONE  60 mg Oral Daily   rosuvastatin  10 mg Oral Daily   spironolactone  25 mg Oral Daily   Continuous Infusions: PRN Meds: hydrALAZINE **OR** hydrALAZINE, HYDROmorphone (DILAUDID) injection, hydrOXYzine, methocarbamol, ondansetron **OR** ondansetron (ZOFRAN) IV, oxyCODONE  Time spent: 35 minutes  Author: Val Riles. MD [facility] 10/07/2022 2:35 PM  To reach On-call, see care teams to locate the attending and reach out to them via [email or web address]. If 7PM-7AM, please contact night-coverage If you still have difficulty reaching the attending provider, please page the [facility] (Director on Call) for [facility] on amion for assistance.

## 2022-10-07 NOTE — Plan of Care (Signed)
  Problem: Education: Goal: Ability to describe self-care measures that may prevent or decrease complications (Diabetes Survival Skills Education) will improve Outcome: Progressing   Problem: Coping: Goal: Ability to adjust to condition or change in health will improve Outcome: Progressing   Problem: Nutritional: Goal: Maintenance of adequate nutrition will improve Outcome: Progressing   Problem: Education: Goal: Knowledge of General Education information will improve Description: Including pain rating scale, medication(s)/side effects and non-pharmacologic comfort measures Outcome: Progressing

## 2022-10-07 NOTE — Progress Notes (Signed)
Physical Therapy Treatment Patient Details Name: Steve Burnett MRN: [id number] DOB: 1961/04/05 Today's Date: 10/07/2022   History of Present Illness Pt is a 62 y/o M admitted on 10/04/22 after presenting with c/o lower back pain.  MRI of the lumbar spine obtained that demonstrated new large left central disc extrusion at the L2-3 with 2.7 cm caudal migration to the L3 inferior endplate resulting in severe spinal canal stenosis. PMH: CAD s/p DES to LAD, RCA, HFrEF, OSA, paroxysmal a-fib, HTN, HLD    PT Comments    Therapist in several times today. Pt has had 2 episodes of intractable back pain today. Pt states no relief yet from recent spinal injection 3/15. Pt is unable to tolerate mobility and therefore session was focused on education for pain management and prevention of overall weakness due to immobility. Pt has decided on surgical intervention which will be delayed due to receiving injection. Plan for d/c home with pain management. Pt may benefit from a TENS unit, he is ordering gel cold pack for home. RW has been delivered to room with ? of needed hospital bed due to EMS possible transport home and unable to maneuver stretcher to bedroom. HHPT may be best option since wife does not drive and pt can't tolerate upright sitting. Will continue to follow acutely and progress as able.   Recommendations for follow up therapy are one component of a multi-disciplinary discharge planning process, led by the attending physician.  Recommendations may be updated based on patient status, additional functional criteria and insurance authorization.  Follow Up Recommendations  Outpatient PT     Assistance Recommended at Discharge Frequent or constant Supervision/Assistance  Patient can return home with the following Assistance with cooking/housework;Assist for transportation;Help with stairs or ramp for entrance   Equipment Recommendations   (Pt has received a RW, may benefit from hospital bed and [facility].)     Recommendations for Other Services       Precautions / Restrictions Precautions Precautions: Back Precaution Booklet Issued: Yes (comment) Precaution Comments: No twisting, bending, sitting too long Restrictions Weight Bearing Restrictions: No     Mobility  Bed Mobility                    Transfers                        Ambulation/Gait                   Stairs             Wheelchair Mobility    Modified Rankin (Stroke Patients Only)       Balance                                            Cognition Arousal/Alertness: Awake/alert Behavior During Therapy: WFL for tasks assessed/performed Overall Cognitive Status: Within Functional Limits for tasks assessed                                 General Comments:  (Worried about returning home)        Exercises      General Comments General comments (skin integrity, edema, etc.):  (Pt with two pain episodes today. Unable to move. Session spent providing education at length regarding pain management at home,  positional strategies, stretching options, HHPT, DME and importance of mobility)      Pertinent Vitals/Pain Pain Assessment Pain Assessment: 0-10 Pain Score: 10-Worst pain ever Pain Location: back Pain Descriptors / Indicators: Grimacing, Guarding, Shooting Pain Intervention(s): Limited activity within patient's tolerance, Relaxation    Home Living                          Prior Function            PT Goals (current goals can now be found in the care plan section) Acute Rehab PT Goals Patient Stated Goal: decreased pain    Frequency    7X/week      PT Plan      Co-evaluation              AM-PAC PT "6 Clicks" Mobility   Outcome Measure  Help needed turning from your back to your side while in a flat bed without using bedrails?: A Little Help needed moving from lying on your back to sitting on the side of  a flat bed without using bedrails?: A Little Help needed moving to and from a bed to a chair (including a wheelchair)?: A Little Help needed standing up from a chair using your arms (e.g., wheelchair or bedside chair)?: A Little Help needed to walk in hospital room?: A Little Help needed climbing 3-5 steps with a railing? : A Lot 6 Click Score: 17    End of Session   Activity Tolerance: Patient limited by pain;Treatment limited secondary to medical complications (Comment) Patient left: in bed;with call bell/phone within reach;with bed alarm set Nurse Communication: Mobility status PT Visit Diagnosis: Pain;Difficulty in walking, not elsewhere classified (R26.2) Pain - part of body:  (Back)     Time: 1530-1550 PT Time Calculation (min) (ACUTE ONLY): 20 min  Charges:  $Therapeutic Activity: 8-22 mins                    Mikel Cella, PTA  [name] 10/07/2022, 4:01 PM

## 2022-10-07 NOTE — Plan of Care (Signed)

## 2022-10-07 NOTE — Progress Notes (Signed)
    Attending Progress Note  History: Steve Burnett is here for lumbar radiculopathy with severe pain and inability to walk.  HD3: Had injection but has not seen significant relief with that. Did stand and walk yesterday. One episode of very bad pain but otherwise well yesterday  Physical Exam: Vitals:   10/06/22 2136 10/06/22 2328  BP: (!) 191/106 (!) 158/98  Pulse: 75 67  Resp: 15 15  Temp: 98.1 F (36.7 C) 98 F (36.7 C)  SpO2: 97% 97%    AA Ox3 CNI  Strength:5/5 throughout BLE  Data:  Other tests/results: n.a  Assessment/Plan:  Steve Burnett is suffering from lumbar radiculopathy due to L2/3 disc herniation.  He has full strength and does not have cauda equina syndrome  - mobilize - pain control - DVT prophylaxis - PTOT - Completed ESI on 3/15  Deetta Perla, MD Department of Neurosurgery

## 2022-10-08 DIAGNOSIS — M48061 Spinal stenosis, lumbar region without neurogenic claudication: Secondary | ICD-10-CM | POA: Diagnosis not present

## 2022-10-08 LAB — BASIC METABOLIC PANEL
Anion gap: 8 (ref 5–15)
BUN: 33 mg/dL — ABNORMAL HIGH (ref 6–20)
CO2: 25 mmol/L (ref 22–32)
Calcium: 8.5 mg/dL — ABNORMAL LOW (ref 8.9–10.3)
Chloride: 100 mmol/L (ref 98–111)
Creatinine, Ser: 0.78 mg/dL (ref 0.61–1.24)
GFR, Estimated: >60 mL/min (ref >60)
Glucose, Bld: 114 mg/dL — ABNORMAL HIGH (ref 70–99)
Potassium: 4.3 mmol/L (ref 3.5–5.1)
Sodium: 133 mmol/L — ABNORMAL LOW (ref 135–145)

## 2022-10-08 LAB — CBC
HCT: 49.9 % (ref 39.0–52.0)
Hemoglobin: 16 g/dL (ref 13.0–17.0)
MCH: 25.2 pg — ABNORMAL LOW (ref 26.0–34.0)
MCHC: 32.1 g/dL (ref 30.0–36.0)
MCV: 78.6 fL — ABNORMAL LOW (ref 80.0–100.0)
Platelets: 312 10*3/uL (ref 150–400)
RBC: 6.35 MIL/uL — ABNORMAL HIGH (ref 4.22–5.81)
RDW: 17.5 % — ABNORMAL HIGH (ref 11.5–15.5)
WBC: 9.6 10*3/uL (ref 4.0–10.5)
nRBC: 0 % (ref 0.0–0.2)

## 2022-10-08 LAB — GLUCOSE, CAPILLARY
Glucose-Capillary: 104 mg/dL — ABNORMAL HIGH (ref 70–99)
Glucose-Capillary: 203 mg/dL — ABNORMAL HIGH (ref 70–99)
Glucose-Capillary: 199 mg/dL — ABNORMAL HIGH (ref 70–99)
Glucose-Capillary: 138 mg/dL — ABNORMAL HIGH (ref 70–99)

## 2022-10-08 LAB — MAGNESIUM: Magnesium: 2.6 mg/dL — ABNORMAL HIGH (ref 1.7–2.4)

## 2022-10-08 LAB — PHOSPHORUS: Phosphorus: 3.3 mg/dL (ref 2.5–4.6)

## 2022-10-08 NOTE — Progress Notes (Signed)
Physical Therapy Treatment Patient Details Name: Steve Burnett MRN: [id number] DOB: [birth year] Today's Date: 10/08/2022   History of Present Illness Pt is a 62 y/o M admitted on 10/04/22 after presenting with c/o lower back pain.  MRI of the lumbar spine obtained that demonstrated new large left central disc extrusion at the L2-3 with 2.7 cm caudal migration to the L3 inferior endplate resulting in severe spinal canal stenosis. PMH: CAD s/p DES to LAD, RCA, HFrEF, OSA, paroxysmal a-fib, HTN, HLD    PT Comments    Pt was sitting in recliner upon arrival. He endorses 6/10 pain but also endorse pain is much better managed today versus previous two days. He was able to stand from recliner to bariatric RW with supervision only. Pt is slow moving due to pain but overall safe. He ambulated into hallway ~ 60 ft total. Distance limited by pt not pain/strength/ or fatigue. He was sitting EOB post session and requested to stay sitting EOB at conclusion of session. RN staff aware, bed alarm in place, with call bell in reach. Author feels as pt's pain improves he will be able to progress to home with OPPT follow up.    Recommendations for follow up therapy are one component of a multi-disciplinary discharge planning process, led by the attending physician.  Recommendations may be updated based on patient status, additional functional criteria and insurance authorization.  Follow Up Recommendations  Outpatient PT     Assistance Recommended at Discharge Intermittent Supervision/Assistance  Patient can return home with the following A little help with walking and/or transfers;A little help with bathing/dressing/bathroom;Assistance with cooking/housework;Assist for transportation;Help with stairs or ramp for entrance   Equipment Recommendations  Rolling walker (2 wheels);BSC/3in1 (may need bariatric due to body habitus)       Precautions / Restrictions Precautions Precautions: Back Precaution Booklet  Issued: Yes (comment) Precaution Comments: No twisting, bending, sitting too long Restrictions Weight Bearing Restrictions: No     Mobility  Bed Mobility  General bed mobility comments: pt was in recliner upon arrival. was sitting EOB post session    Transfers Overall transfer level: Needs assistance Equipment used: Rolling walker (2 wheels) Transfers: Sit to/from Stand Sit to Stand: Supervision   Ambulation/Gait Ambulation/Gait assistance: Supervision Gait Distance (Feet): 60 Feet Assistive device: Rolling walker (2 wheels) Gait Pattern/deviations: Step-through pattern Gait velocity: decreased  General Gait Details: Pt was able to mabulate ~ 60 ft with RW. no LOB or safety concern noted. Needed vcs for posture correction only. distance limited by patient but vitals stable without fatigue noted.    Balance Overall balance assessment: Modified Independent     Cognition Arousal/Alertness: Awake/alert Behavior During Therapy: WFL for tasks assessed/performed Overall Cognitive Status: Within Functional Limits for tasks assessed   General Comments: Pt is A and O x4. Agrees to session and was cooperative throughout. Endorses 6/10 pain however several times mentioned it was better than previous two days               Pertinent Vitals/Pain Pain Assessment Pain Assessment: 0-10 Pain Score: 6  Pain Descriptors / Indicators: Grimacing, Guarding, Shooting Pain Intervention(s): Limited activity within patient's tolerance, Monitored during session, Premedicated before session, Repositioned     PT Goals (current goals can now be found in the care plan section) Acute Rehab PT Goals Patient Stated Goal: get better Progress towards PT goals: Progressing toward goals    Frequency    7X/week      PT Plan Current plan remains appropriate  AM-PAC PT "6 Clicks" Mobility   Outcome Measure  Help needed turning from your back to your side while in a flat bed without  using bedrails?: A Little Help needed moving from lying on your back to sitting on the side of a flat bed without using bedrails?: A Little Help needed moving to and from a bed to a chair (including a wheelchair)?: A Little Help needed standing up from a chair using your arms (e.g., wheelchair or bedside chair)?: A Little Help needed to walk in hospital room?: A Little Help needed climbing 3-5 steps with a railing? : A Little 6 Click Score: 18    End of Session   Activity Tolerance: Patient tolerated treatment well;Patient limited by pain Patient left: in bed;with call bell/phone within reach;with bed alarm set Nurse Communication: Mobility status PT Visit Diagnosis: Pain;Difficulty in walking, not elsewhere classified (R26.2)     Time: [id number] PT Time Calculation (min) (ACUTE ONLY): 15 min  Charges:  $Gait Training: 8-22 mins                     Julaine Fusi PTA 10/08/22, 12:24 PM

## 2022-10-08 NOTE — Plan of Care (Signed)

## 2022-10-08 NOTE — TOC Progression Note (Signed)
Transition of Care [facility]) - Progression Note    Patient Details  Name: Steve Burnett MRN: [id number] Date of Birth: 02/19/1961  Transition of Care [facility]) CM/SW [place], RN Phone Number: 10/08/2022, 4:13 PM  Clinical Narrative:    I have meet with patient at beside today.  He informs me that prior to admission he lived at home with his wife.  He states that prior to admission he was not able to do very much for his self due to pain.  He reports that his wife had to assist with bathing and dressing for about 2-3 prior to admission due to the intensity of pain that he was having.    Today patient was able to sit on the side of the bed and reports that today was the first day that he could sit up by himself in about 2-3 weeks due to the level of pain he was experiencing.    He is hopeful that he can return home.  He reports that he was able to drive prior to the on-set of the severe pain.  He does have a supportive wife that does not drive.  Patient reports that he has a CPAP and a walker at home.    Patient reports that if pain is under better control he is hopeful to return home with support of his wife.    TOC will continue to follow for discharge planning.        Expected Discharge Plan: Home/Self Care (May need HH/ Outpatient PT) Barriers to Discharge: No Barriers Identified  Expected Discharge Plan and Services   Discharge Planning Services: CM Consult   Living arrangements for the past 2 months: Single Family Home                 DME Arranged: Walker rolling (bariatric) DME Agency: [employer] Date DME Agency Contacted: 10/05/22 Time DME Agency Contacted: 1 Representative spoke with at DME Agency: [place]: NA [place] Agency: NA         Social Determinants of Health ([place]) Interventions SDOH Screenings   Food Insecurity: No Food Insecurity (10/04/2022)  Housing: Low Risk  (10/04/2022)  Transportation Needs: No Transportation Needs  (10/04/2022)  Utilities: At Risk (10/04/2022)  Depression (PHQ2-9): Low Risk  (04/08/2020)  Tobacco Use: Low Risk  (10/06/2022)    Readmission Risk Interventions     No data to display

## 2022-10-08 NOTE — Progress Notes (Addendum)
[facility] Progress Note  Patient: Steve Burnett    [id number]  DOA: 10/04/2022     Date of Service: the patient was seen and examined on 10/08/2022  Chief Complaint  Patient presents with   Back Pain   Groin Pain   Brief hospital course: [name] is a 62 y.o. male with medical history significant of CAD s/p DES to LAD (2013) and RCA (2019), HFrEF with last EF of 45-50%, OSA, paroxysmal A-fib, hypertension, hyperlipidemia, who presents to the ED due to lower back pain. He did lift heavy objects approximately 2 weeks before onset but he did not have pain then. Pain initially came on gradually before suddenly exacerbating while lifting an object at work. He notes that pain is exacerbated by laying flat and trying to stand up. He has been taking ibuprofen 800 mg intermittently with minimal relief.  MRI of the lumbar spine obtained that demonstrated new large left central disc extrusion at the L2-3 with 2.7 cm caudal migration to the L3 inferior endplate resulting in severe spinal canal stenosis. Neurosurgery consulted. [facility] contacted for admission.    Assessment and Plan:  # Lumbar spinal stenosis without cauda equina syndrome  S/p Decadron 10 mg IV injection, continue prednisone 60 mg p.o. daily for now, we will plan to gradually taper down Decreased ibuprofen 600 mg p.o. 3 times daily, Tylenol 1000 mg p.o. 3 times daily Increased oxycodone 10 mg p.o. 6 hourly IV Dilaudid as needed for pectoral pain PPI for GI prophylaxis Neurosurgery consulted, Plavix was held for possibility of laminectomy vs epidural injection.   Laminectomy could be done till Monday, 3/18 due to Plavix, but patient was on severe intractable pain so he could not wait, patient proceeded with epidural steroid injection which was done on 3/15 by IR.  Neurosurgery will delay the laminectomy for few weeks.  Recommended to continue Plavix for now.     # CAD s/p DES to LAD and RCA. , HTN, HLD Continue aspirin,  Coreg, lisinopril, spironolactone, and Crestor Home medications Plavix was held for possibility of laminectomy and epidural injection.  Patient got epidural steroid injection on 3/15, resumed Plavix on 3/16, cleared by IR and neurosurgery. Monitor BP and titrate medication accordingly   Diabetes mellitus type 2, hemoglobin A1c 7.9 Hyperglycemia could be secondary to steroids continue diabetic diet Continue NovoLog sliding scale Monitor CBG   Morbid obesity: Body mass index is 52.62 kg/m.  Interventions: Calorie restricted diet and daily exercise advised to lose body weight.  Lifestyle modification advised.       Diet: Carb modified and heart healthy diet DVT Prophylaxis: Subcutaneous Lovenox   Advance goals of care discussion: Full code  Family Communication: family was not present at bedside, at the time of interview.  The pt provided permission to discuss medical plan with the family. Opportunity was given to ask question and all questions were answered satisfactorily.   Disposition:  Pt is from Home, admitted with intractable back pain due to lumbar spinal stenosis, still has significant pain, which precludes a safe discharge. Discharge to home, when stable and cleared by neurosurgery.  Subjective: No significant events overnight, patient has started walking to the bathroom but is still has back pain 5/10, feels improvement but is still not ready to go home due to pain.  Denies any other complaints.  Physical Exam: General: NAD, sitting comfortably in the recliner  Eyes: PERRLA ENT: Oral Mucosa Clear, moist  Neck: no JVD,  Cardiovascular: S1 and S2 Present, no Murmur,  Respiratory: good respiratory effort, Bilateral Air entry equal and Decreased, no Crackles, no wheezes Abdomen: Bowel Sound present, Soft and no tenderness,  Skin: no rashes Extremities: no Pedal edema, no calf tenderness Neurologic: without any new focal findings Gait not checked due to patient safety  concerns  Vitals:   10/06/22 2328 10/07/22 1530 10/07/22 2212 10/08/22 0732  BP: (!) 158/98 (!) 151/91 (!) 149/92 (!) 162/101  Pulse: 67 70 76 68  Resp: 15 19 18 16   Temp: 98 F (36.7 C) 98.2 F (36.8 C) 98.2 F (36.8 C) 97.7 F (36.5 C)  TempSrc:      SpO2: 97% 96% 98% 98%  Weight:      Height:        Intake/Output Summary (Last 24 hours) at 10/08/2022 1317 Last data filed at 10/08/2022 0725 Gross per 24 hour  Intake --  Output 900 ml  Net -900 ml   Filed Weights   10/04/22 1024  Weight: (!) 168.7 kg    Data Reviewed: I have personally reviewed and interpreted daily labs, tele strips, imagings as discussed above. I reviewed all nursing notes, pharmacy notes, vitals, pertinent old records I have discussed plan of care as described above with RN and patient/family.  CBC: Recent Labs  Lab 10/04/22 1029 10/05/22 0415 10/06/22 0559 10/07/22 0505 10/08/22 0412  WBC 15.6* 13.8* 13.5* 10.1 9.6  NEUTROABS 13.3*  --   --   --   --   HGB 16.7 15.8 15.8 15.6 16.0  HCT 51.5 48.6 49.3 49.0 49.9  MCV 78.0* 78.0* 79.6* 80.1 78.6*  PLT 401* 359 335 262 [id number]   Basic Metabolic Panel: Recent Labs  Lab 10/04/22 1029 10/05/22 0415 10/06/22 0559 10/07/22 0505 10/08/22 0412  NA 131* 134* 135 133* 133*  K 4.0 4.6 4.3 4.2 4.3  CL 100 101 98 102 100  CO2 20* 24 24 23 25   GLUCOSE 220* 166* 140* 167* 114*  BUN 30* 39* 44* 36* 33*  CREATININE 0.92 0.79 0.93 0.82 0.78  CALCIUM 8.9 8.7* 9.2 8.3* 8.5*  MG  --   --  2.5* 2.4 2.6*  PHOS  --   --  4.0 3.0 3.3    Studies: No results found.  Scheduled Meds:  acetaminophen  1,000 mg Oral TID   aspirin EC  81 mg Oral Daily   bisacodyl  10 mg Oral QHS   carvedilol  25 mg Oral BID   clopidogrel  75 mg Oral Daily   enoxaparin (LOVENOX) injection  85 mg Subcutaneous Q24H   gabapentin  100 mg Oral TID   ibuprofen  600 mg Oral TID   insulin aspart  0-15 Units Subcutaneous TID WC   lisinopril  40 mg Oral Daily   methocarbamol  500  mg Oral TID   pantoprazole  40 mg Oral BID   polyethylene glycol  17 g Oral BID   predniSONE  60 mg Oral Daily   rosuvastatin  10 mg Oral Daily   spironolactone  25 mg Oral Daily   Continuous Infusions: PRN Meds: hydrALAZINE **OR** hydrALAZINE, HYDROmorphone (DILAUDID) injection, hydrOXYzine, ondansetron **OR** ondansetron (ZOFRAN) IV, oxyCODONE  Time spent: 35 minutes  Author: Val Riles. MD [facility] 10/08/2022 1:17 PM  To reach On-call, see care teams to locate the attending and reach out to them via [email or web address]. If 7PM-7AM, please contact night-coverage If you still have difficulty reaching the attending provider, please page the [facility] (Director on Call) for [facility] on amion for assistance.

## 2022-10-09 ENCOUNTER — Telehealth: Payer: Self-pay | Admitting: Cardiovascular Disease

## 2022-10-09 DIAGNOSIS — M48061 Spinal stenosis, lumbar region without neurogenic claudication: Secondary | ICD-10-CM | POA: Diagnosis not present

## 2022-10-09 LAB — CBC
HCT: 46.7 % (ref 39.0–52.0)
Hemoglobin: 15.4 g/dL (ref 13.0–17.0)
MCH: 25.7 pg — ABNORMAL LOW (ref 26.0–34.0)
MCHC: 33 g/dL (ref 30.0–36.0)
MCV: 77.8 fL — ABNORMAL LOW (ref 80.0–100.0)
Platelets: 308 10*3/uL (ref 150–400)
RBC: 6 MIL/uL — ABNORMAL HIGH (ref 4.22–5.81)
RDW: 16.9 % — ABNORMAL HIGH (ref 11.5–15.5)
WBC: 9.1 10*3/uL (ref 4.0–10.5)
nRBC: 0 % (ref 0.0–0.2)

## 2022-10-09 LAB — BASIC METABOLIC PANEL
Anion gap: 10 (ref 5–15)
BUN: 40 mg/dL — ABNORMAL HIGH (ref 6–20)
CO2: 23 mmol/L (ref 22–32)
Calcium: 8.5 mg/dL — ABNORMAL LOW (ref 8.9–10.3)
Chloride: 100 mmol/L (ref 98–111)
Creatinine, Ser: 0.84 mg/dL (ref 0.61–1.24)
GFR, Estimated: >60 mL/min (ref >60)
Glucose, Bld: 117 mg/dL — ABNORMAL HIGH (ref 70–99)
Potassium: 4.1 mmol/L (ref 3.5–5.1)
Sodium: 133 mmol/L — ABNORMAL LOW (ref 135–145)

## 2022-10-09 LAB — GLUCOSE, CAPILLARY
Glucose-Capillary: 119 mg/dL — ABNORMAL HIGH (ref 70–99)
Glucose-Capillary: 182 mg/dL — ABNORMAL HIGH (ref 70–99)
Glucose-Capillary: 116 mg/dL — ABNORMAL HIGH (ref 70–99)
Glucose-Capillary: 230 mg/dL — ABNORMAL HIGH (ref 70–99)
Glucose-Capillary: 142 mg/dL — ABNORMAL HIGH (ref 70–99)

## 2022-10-09 NOTE — Telephone Encounter (Signed)
New Message:   Patient say heis in [facility]. He says he would like for Dr Fletcher Anon to review his hospital notes, and give him any feedback  please. Marland Kitchen

## 2022-10-09 NOTE — Progress Notes (Signed)
[facility] Progress Note  Patient: Steve Burnett    [id number]  DOA: 10/04/2022     Date of Service: the patient was seen and examined on 10/09/2022  Chief Complaint  Patient presents with   Back Pain   Groin Pain   Brief hospital course: Steve Burnett is a 62 y.o. male with medical history significant of CAD s/p DES to LAD (2013) and RCA (2019), HFrEF with last EF of 45-50%, OSA, paroxysmal A-fib, hypertension, hyperlipidemia, who presents to the ED due to lower back pain. He did lift heavy objects approximately 2 weeks before onset but he did not have pain then. Pain initially came on gradually before suddenly exacerbating while lifting an object at work. He notes that pain is exacerbated by laying flat and trying to stand up. He has been taking ibuprofen 800 mg intermittently with minimal relief.  MRI of the lumbar spine obtained that demonstrated new large left central disc extrusion at the L2-3 with 2.7 cm caudal migration to the L3 inferior endplate resulting in severe spinal canal stenosis. Neurosurgery consulted. [facility] contacted for admission.    Assessment and Plan:  # Lumbar spinal stenosis without cauda equina syndrome  S/p Decadron 10 mg IV injection, continue prednisone 60 mg p.o. daily for now, we will plan to gradually taper down Decreased ibuprofen 600 mg p.o. 3 times daily, Tylenol 1000 mg p.o. 3 times daily Increased oxycodone 10 mg p.o. 6 hourly IV Dilaudid as needed for pectoral pain PPI for GI prophylaxis Neurosurgery consulted, Plavix was held for possibility of laminectomy vs epidural injection.   Laminectomy could be done till Monday, 3/18 due to Plavix, but patient was on severe intractable pain so he could not wait, patient proceeded with epidural steroid injection which was done on 3/15 by IR.  Neurosurgery will delay the laminectomy for few weeks.  Recommended to continue Plavix for now.     # CAD s/p DES to LAD and RCA. , HTN, HLD Continue aspirin,  Coreg, lisinopril, spironolactone, and Crestor Home medications Plavix was held for possibility of laminectomy and epidural injection.  Patient got epidural steroid injection on 3/15, resumed Plavix on 3/16, cleared by IR and neurosurgery. Monitor BP and titrate medication accordingly   Diabetes mellitus type 2, hemoglobin A1c 7.9 Hyperglycemia could be secondary to steroids continue diabetic diet Continue NovoLog sliding scale Monitor CBG   Morbid obesity: Body mass index is 52.62 kg/m.  Interventions: Calorie restricted diet and daily exercise advised to lose body weight.  Lifestyle modification advised.       Diet: Carb modified and heart healthy diet DVT Prophylaxis: Subcutaneous Lovenox   Advance goals of care discussion: Full code  Family Communication: family was not present at bedside, at the time of interview.  The pt provided permission to discuss medical plan with the family. Opportunity was given to ask question and all questions were answered satisfactorily.   Disposition:  Pt is from Home, admitted with intractable back pain due to lumbar spinal stenosis, still has significant pain, which precludes a safe discharge. Discharge to home, when stable and cleared by neurosurgery.  Subjective: No significant events overnight, patient is feeling improvement, at rest he is not feeling pain but whenever he moves and walk around he is feeling pain and in certain positions he is feeling pain so he needs to change position frequently, cannot sit for more than 10 to 11 minutes. Patient still does not feel comfortable going home with this pain so would like to stay  another day, we will reeval him tomorrow a.m.   Physical Exam: General: NAD, laying comfortably in the bed  Eyes: PERRLA ENT: Oral Mucosa Clear, moist  Neck: no JVD,  Cardiovascular: S1 and S2 Present, no Murmur,  Respiratory: good respiratory effort, Bilateral Air entry equal and Decreased, no Crackles, no  wheezes Abdomen: Bowel Sound present, Soft and no tenderness,  Skin: no rashes Extremities: no Pedal edema, no calf tenderness Neurologic: without any new focal findings Gait not checked due to patient safety concerns  Vitals:   10/08/22 0732 10/08/22 1624 10/09/22 0032 10/09/22 0911  BP: (!) 162/101 (!) 162/89 111/73 (!) 146/86  Pulse: 68 70 62 64  Resp: 16 17 16 17   Temp: 97.7 F (36.5 C)  98.4 F (36.9 C) 98 F (36.7 C)  TempSrc:      SpO2: 98% 96% 98% 96%  Weight:      Height:       No intake or output data in the 24 hours ending 10/09/22 1404  Filed Weights   10/04/22 1024  Weight: (!) 168.7 kg    Data Reviewed: I have personally reviewed and interpreted daily labs, tele strips, imagings as discussed above. I reviewed all nursing notes, pharmacy notes, vitals, pertinent old records I have discussed plan of care as described above with RN and patient/family.  CBC: Recent Labs  Lab 10/04/22 1029 10/05/22 0415 10/06/22 0559 10/07/22 0505 10/08/22 0412 10/09/22 0420  WBC 15.6* 13.8* 13.5* 10.1 9.6 9.1  NEUTROABS 13.3*  --   --   --   --   --   HGB 16.7 15.8 15.8 15.6 16.0 15.4  HCT 51.5 48.6 49.3 49.0 49.9 46.7  MCV 78.0* 78.0* 79.6* 80.1 78.6* 77.8*  PLT 401* 359 335 262 312 [id number]   Basic Metabolic Panel: Recent Labs  Lab 10/05/22 0415 10/06/22 0559 10/07/22 0505 10/08/22 0412 10/09/22 0420  NA 134* 135 133* 133* 133*  K 4.6 4.3 4.2 4.3 4.1  CL 101 98 102 100 100  CO2 24 24 23 25 23   GLUCOSE 166* 140* 167* 114* 117*  BUN 39* 44* 36* 33* 40*  CREATININE 0.79 0.93 0.82 0.78 0.84  CALCIUM 8.7* 9.2 8.3* 8.5* 8.5*  MG  --  2.5* 2.4 2.6*  --   PHOS  --  4.0 3.0 3.3  --     Studies: No results found.  Scheduled Meds:  acetaminophen  1,000 mg Oral TID   aspirin EC  81 mg Oral Daily   bisacodyl  10 mg Oral QHS   carvedilol  25 mg Oral BID   clopidogrel  75 mg Oral Daily   enoxaparin (LOVENOX) injection  85 mg Subcutaneous Q24H   gabapentin  100 mg  Oral TID   ibuprofen  600 mg Oral TID   insulin aspart  0-15 Units Subcutaneous TID WC   lisinopril  40 mg Oral Daily   methocarbamol  500 mg Oral TID   pantoprazole  40 mg Oral BID   polyethylene glycol  17 g Oral BID   predniSONE  60 mg Oral Daily   rosuvastatin  10 mg Oral Daily   spironolactone  25 mg Oral Daily   Continuous Infusions: PRN Meds: hydrALAZINE **OR** hydrALAZINE, HYDROmorphone (DILAUDID) injection, hydrOXYzine, ondansetron **OR** ondansetron (ZOFRAN) IV, oxyCODONE  Time spent: 35 minutes  Author: Val Riles. MD [facility] 10/09/2022 2:04 PM  To reach On-call, see care teams to locate the attending and reach out to them via [email or web address]. If 7PM-7AM,  please contact night-coverage If you still have difficulty reaching the attending provider, please page the [facility] (Director on Call) for [facility] on amion for assistance.

## 2022-10-09 NOTE — Telephone Encounter (Signed)
I reviewed the notes and he is having issues with lumbar spine disc herniation.  If surgery is needed to fix that, clopidogrel should be held 7 days before surgery.

## 2022-10-09 NOTE — Progress Notes (Signed)
Physical Therapy Treatment Patient Details Name: Steve Burnett MRN: [id number] DOB: 05-14-1961 Today's Date: 10/09/2022   History of Present Illness Pt is a 62 y/o M admitted on 10/04/22 after presenting with c/o lower back pain.  MRI of the lumbar spine obtained that demonstrated new large left central disc extrusion at the L2-3 with 2.7 cm caudal migration to the L3 inferior endplate resulting in severe spinal canal stenosis. PMH: CAD s/p DES to LAD, RCA, HFrEF, OSA, paroxysmal a-fib, HTN, HLD    PT Comments    Continued improvement noted with mobility tolerance and pain control. Pt seen prior to lunch without receiving heavy pain meds. Able to tolerate 177ft of gait training with RW and sitting upright in recliner with ice to low back. Pt endorses 4/10 pain without radiculopathy. He remains motivated to improve and continue to work towards maintaining pain level prior to returning home. Currently discussing initial HHPT due to transportation, pt has a RW for home use, will discuss further equipment needs. Continue per POC.   Recommendations for follow up therapy are one component of a multi-disciplinary discharge planning process, led by the attending physician.  Recommendations may be updated based on patient status, additional functional criteria and insurance authorization.  Follow Up Recommendations  Outpatient PT     Assistance Recommended at Discharge Intermittent Supervision/Assistance  Patient can return home with the following A little help with walking and/or transfers;A little help with bathing/dressing/bathroom;Assistance with cooking/housework;Assist for transportation;Help with stairs or ramp for entrance   Equipment Recommendations  BSC/3in1 (May need bariatric BSC due to body habitus)    Recommendations for Other Services       Precautions / Restrictions Precautions Precautions: Back Precaution Booklet Issued: Yes (comment) Precaution Comments: No twisting, bending,  sitting too long Restrictions Weight Bearing Restrictions: No     Mobility  Bed Mobility Overal bed mobility: Needs Assistance Bed Mobility: Sidelying to Sit, Sit to Sidelying Rolling: Supervision Sidelying to sit: Supervision     Sit to sidelying: Supervision General bed mobility comments:  (Use of side rails to maneuver to edge of bed)    Transfers Overall transfer level: Needs assistance Equipment used: Rolling walker (2 wheels) Transfers: Sit to/from Stand Sit to Stand: Supervision           General transfer comment:  (Pt is ModI for transfers and use of bathroom with personal RW)    Ambulation/Gait Ambulation/Gait assistance: Supervision Gait Distance (Feet):  (100) Assistive device: Rolling walker (2 wheels) Gait Pattern/deviations: Step-through pattern       General Gait Details:  (Increased distance from previous session. Continues to endorse pain, however not as intense as previous days)   Marine scientist [place] (Stroke Patients Only)       Balance                                            Cognition Arousal/Alertness: Awake/alert Behavior During Therapy: WFL for tasks assessed/performed Overall Cognitive Status: Within Functional Limits for tasks assessed                                 General Comments: Pt is A and O x4. Agrees to session and was cooperative throughout.  Exercises      General Comments General comments (skin integrity, edema, etc.): Pt educated on importance of out of bed activity and increasing mobility as pain is better controlled. Pt given ice packs to reduce pain level while sitting up. Education provided regarding proper use of TENS unit and application/wear time with good understanding      Pertinent Vitals/Pain Pain Assessment Pain Assessment: 0-10 Pain Score: 4  Pain Location: back Pain Descriptors / Indicators: Discomfort,  Guarding, Aching Pain Intervention(s): Limited activity within patient's tolerance    Home Living                          Prior Function            PT Goals (current goals can now be found in the care plan section) Acute Rehab PT Goals Patient Stated Goal: get better Progress towards PT goals: Progressing toward goals    Frequency    7X/week      PT Plan Current plan remains appropriate    Co-evaluation              AM-PAC PT "6 Clicks" Mobility   Outcome Measure  Help needed turning from your back to your side while in a flat bed without using bedrails?: A Little Help needed moving from lying on your back to sitting on the side of a flat bed without using bedrails?: A Little Help needed moving to and from a bed to a chair (including a wheelchair)?: A Little Help needed standing up from a chair using your arms (e.g., wheelchair or bedside chair)?: A Little Help needed to walk in hospital room?: A Little Help needed climbing 3-5 steps with a railing? : A Little 6 Click Score: 18    End of Session Equipment Utilized During Treatment: Gait belt Activity Tolerance: Patient tolerated treatment well;Patient limited by pain Patient left: in chair;with call bell/phone within reach Nurse Communication: Mobility status PT Visit Diagnosis: Pain;Difficulty in walking, not elsewhere classified (R26.2) Pain - part of body:  (low back)     Time: [id number] PT Time Calculation (min) (ACUTE ONLY): 43 min  Charges:  $Gait Training: 8-22 mins $Therapeutic Exercise: 8-22 mins $Therapeutic Activity: 8-22 mins                    Steve Burnett, PTA    [name] 10/09/2022, 12:50 PM

## 2022-10-09 NOTE — Plan of Care (Signed)
  Problem: Education: Goal: Ability to describe self-care measures that may prevent or decrease complications (Diabetes Survival Skills Education) will improve Outcome: Progressing   Problem: Coping: Goal: Ability to adjust to condition or change in health will improve Outcome: Progressing   Problem: Health Behavior/Discharge Planning: Goal: Ability to identify and utilize available resources and services will improve Outcome: Progressing   Problem: Nutritional: Goal: Maintenance of adequate nutrition will improve Outcome: Progressing   Problem: Tissue Perfusion: Goal: Adequacy of tissue perfusion will improve Outcome: Progressing

## 2022-10-10 ENCOUNTER — Other Ambulatory Visit: Payer: Self-pay

## 2022-10-10 DIAGNOSIS — M48061 Spinal stenosis, lumbar region without neurogenic claudication: Secondary | ICD-10-CM | POA: Diagnosis not present

## 2022-10-10 LAB — CBC
HCT: 46.9 % (ref 39.0–52.0)
Hemoglobin: 15.2 g/dL (ref 13.0–17.0)
MCH: 25.7 pg — ABNORMAL LOW (ref 26.0–34.0)
MCHC: 32.4 g/dL (ref 30.0–36.0)
MCV: 79.2 fL — ABNORMAL LOW (ref 80.0–100.0)
Platelets: 182 10*3/uL (ref 150–400)
RBC: 5.92 MIL/uL — ABNORMAL HIGH (ref 4.22–5.81)
RDW: 17.5 % — ABNORMAL HIGH (ref 11.5–15.5)
WBC: 7.5 10*3/uL (ref 4.0–10.5)
nRBC: 0 % (ref 0.0–0.2)

## 2022-10-10 LAB — GLUCOSE, CAPILLARY
Glucose-Capillary: 91 mg/dL (ref 70–99)
Glucose-Capillary: 135 mg/dL — ABNORMAL HIGH (ref 70–99)

## 2022-10-10 LAB — BASIC METABOLIC PANEL
Anion gap: 9 (ref 5–15)
BUN: 33 mg/dL — ABNORMAL HIGH (ref 6–20)
CO2: 21 mmol/L — ABNORMAL LOW (ref 22–32)
Calcium: 8.3 mg/dL — ABNORMAL LOW (ref 8.9–10.3)
Chloride: 102 mmol/L (ref 98–111)
Creatinine, Ser: 0.73 mg/dL (ref 0.61–1.24)
GFR, Estimated: >60 mL/min (ref >60)
Glucose, Bld: 108 mg/dL — ABNORMAL HIGH (ref 70–99)
Potassium: 5.7 mmol/L — ABNORMAL HIGH (ref 3.5–5.1)
Sodium: 132 mmol/L — ABNORMAL LOW (ref 135–145)

## 2022-10-10 MED ORDER — GABAPENTIN 100 MG PO CAPS
100.0000 mg | ORAL_CAPSULE | Freq: Three times a day (TID) | ORAL | 0 refills | Status: DC
  Filled 2022-10-10: qty 90, 30d supply, fill #0

## 2022-10-10 MED ORDER — HYDROXYZINE HCL 25 MG PO TABS
25.0000 mg | ORAL_TABLET | Freq: Three times a day (TID) | ORAL | 1 refills | Status: DC | PRN
  Filled 2022-10-10: qty 30, 10d supply, fill #0
  Filled 2022-10-27: qty 30, 10d supply, fill #1

## 2022-10-10 MED ORDER — IBUPROFEN 600 MG PO TABS
600.0000 mg | ORAL_TABLET | Freq: Three times a day (TID) | ORAL | 0 refills | Status: AC | PRN
  Filled 2022-10-10: qty 90, 30d supply, fill #0

## 2022-10-10 MED ORDER — OXYCODONE HCL 10 MG PO TABS
10.0000 mg | ORAL_TABLET | Freq: Four times a day (QID) | ORAL | 0 refills | Status: DC | PRN
  Filled 2022-10-10: qty 30, 7d supply, fill #0

## 2022-10-10 MED ORDER — METHOCARBAMOL 500 MG PO TABS
500.0000 mg | ORAL_TABLET | Freq: Three times a day (TID) | ORAL | 1 refills | Status: DC | PRN
  Filled 2022-10-10: qty 30, 10d supply, fill #0
  Filled 2022-10-27: qty 30, 10d supply, fill #1

## 2022-10-10 MED ORDER — PREDNISONE 10 MG PO TABS
ORAL_TABLET | ORAL | 0 refills | Status: AC
  Filled 2022-10-10: qty 20, 8d supply, fill #0

## 2022-10-10 MED ORDER — POLYETHYLENE GLYCOL 3350 17 GM/SCOOP PO POWD
17.0000 g | Freq: Every day | ORAL | 0 refills | Status: AC
  Filled 2022-10-10: qty 510, 30d supply, fill #0

## 2022-10-10 MED ORDER — BISACODYL 5 MG PO TBEC
10.0000 mg | DELAYED_RELEASE_TABLET | Freq: Every day | ORAL | 0 refills | Status: DC
  Filled 2022-10-10: qty 25, 12d supply, fill #0
  Filled 2022-10-10: qty 30, 15d supply, fill #0
  Filled 2022-10-10: qty 25, 12d supply, fill #0

## 2022-10-10 MED ORDER — PANTOPRAZOLE SODIUM 40 MG PO TBEC
40.0000 mg | DELAYED_RELEASE_TABLET | Freq: Two times a day (BID) | ORAL | 1 refills | Status: DC
  Filled 2022-10-10: qty 60, 30d supply, fill #0
  Filled 2022-11-09: qty 60, 30d supply, fill #1

## 2022-10-10 MED ORDER — HYDRALAZINE HCL 50 MG PO TABS
50.0000 mg | ORAL_TABLET | Freq: Three times a day (TID) | ORAL | 0 refills | Status: DC | PRN
  Filled 2022-10-10: qty 90, 30d supply, fill #0

## 2022-10-10 MED ORDER — ACETAMINOPHEN 325 MG PO TABS
650.0000 mg | ORAL_TABLET | Freq: Three times a day (TID) | ORAL | 0 refills | Status: AC | PRN
  Filled 2022-10-10: qty 180, 30d supply, fill #0
  Filled 2022-10-10: qty 100, 17d supply, fill #0
  Filled 2022-10-27: qty 100, 17d supply, fill #1

## 2022-10-10 NOTE — Telephone Encounter (Signed)
Called and spoke with the patient. The patient is currently getting lumbar injections with potential back surgery in 2 months. He will call back once it has been scheduled and get preop clearance sent.

## 2022-10-10 NOTE — Progress Notes (Signed)
DISCHARGE NOTE:   Pt discharged with belongings bag and medications delivered from pharmacy. Discharge instructions given to pt and pt has no further questions or concerns. Transportation provided via family friend.

## 2022-10-10 NOTE — Progress Notes (Signed)
Occupational Therapy Treatment Patient Details Name: Steve Burnett MRN: [id number] DOB: 05-05-1961 Today's Date: 10/10/2022   History of present illness Pt is a 62 y/o M admitted on 10/04/22 after presenting with c/o lower back pain.  MRI of the lumbar spine obtained that demonstrated new large left central disc extrusion at the L2-3 with 2.7 cm caudal migration to the L3 inferior endplate resulting in severe spinal canal stenosis. PMH: CAD s/p DES to LAD, RCA, HFrEF, OSA, paroxysmal a-fib, HTN, HLD   OT comments  Upon entering the room, pt supine in bed and agreeable to OT intervention. Pt is fully dressed in his own clothing and reports needing assistance for LB self care. OT discussed use of LH reacher to increase Ind with LB dressing and pt reports having one but planning to buy a shorter one for greater ease. Pt endorses ambulation in room with RW without staff assistance. Pt has several questions written in phone memo and does express having some anxiety about returning home. Education and conversation related to safety awareness for home. Pt declines sittng up further and returns to bed at end of session. Call bell and all needed items within reach.    Recommendations for follow up therapy are one component of a multi-disciplinary discharge planning process, led by the attending physician.  Recommendations may be updated based on patient status, additional functional criteria and insurance authorization.    Follow Up Recommendations  No OT follow up     Assistance Recommended at Discharge Intermittent Supervision/Assistance  Patient can return home with the following  A little help with walking and/or transfers;A little help with bathing/dressing/bathroom;Assistance with cooking/housework;Assist for transportation;Help with stairs or ramp for entrance   Equipment Recommendations  None recommended by OT       Precautions / Restrictions Precautions Precautions:  Back Restrictions Weight Bearing Restrictions: No       Mobility Bed Mobility Overal bed mobility: Modified Independent             General bed mobility comments: HOB elevated but no physical assistance provided    Transfers                         Balance Overall balance assessment: Modified Independent                                         ADL either performed or assessed with clinical judgement   ADL Overall ADL's : Needs assistance/impaired                                       General ADL Comments: Pt is fully dressed. Still needs assistance with threading clothing onto B LEs and recommended LH reacher. He has one at home but may get a shorter one for greater ease.    Extremity/Trunk Assessment Upper Extremity Assessment Upper Extremity Assessment: Overall WFL for tasks assessed   Lower Extremity Assessment Lower Extremity Assessment: Overall WFL for tasks assessed        Vision Patient Visual Report: No change from baseline            Cognition Arousal/Alertness: Awake/alert Behavior During Therapy: WFL for tasks assessed/performed Overall Cognitive Status: Within Functional Limits for tasks assessed  Pertinent Vitals/ Pain       Pain Assessment Pain Assessment: Faces Faces Pain Scale: Hurts a little bit Pain Location: back Pain Descriptors / Indicators: Discomfort, Guarding, Aching Pain Intervention(s): Limited activity within patient's tolerance, Monitored during session, Repositioned         Frequency  Min 2X/week        Progress Toward Goals  OT Goals(current goals can now be found in the care plan section)  Progress towards OT goals: Progressing toward goals     Plan Discharge plan remains appropriate;Frequency remains appropriate       AM-PAC OT "6 Clicks" Daily Activity     Outcome Measure   Help from  another person eating meals?: None Help from another person taking care of personal grooming?: None Help from another person toileting, which includes using toliet, bedpan, or urinal?: A Little Help from another person bathing (including washing, rinsing, drying)?: A Little Help from another person to put on and taking off regular upper body clothing?: None Help from another person to put on and taking off regular lower body clothing?: A Little 6 Click Score: 21    End of Session    OT Visit Diagnosis: Unsteadiness on feet (R26.81);Muscle weakness (generalized) (M62.81);Pain   Activity Tolerance Patient tolerated treatment well   Patient Left in bed;with call bell/phone within reach   Nurse Communication Mobility status        Time: [id number] OT Time Calculation (min): 17 min  Charges: OT General Charges $OT Visit: 1 Visit OT Treatments $Self Care/Home Management : 8-22 mins  Darleen Crocker, MS, OTR/L , CBIS ascom [phone]  10/10/22, 1:37 PM

## 2022-10-10 NOTE — TOC Transition Note (Signed)
Transition of Care [facility]) - CM/SW Discharge Note   Patient Details  Name: Steve Burnett MRN: [id number] Date of Birth: Sep 09, 1960  Transition of Care [facility]) CM/SW Contact:  Conception Oms, RN Phone Number: 10/10/2022, 1:41 PM   Clinical Narrative:    [place] with [facility] confirmed that the patient has no copay but will need to pay 10% until his deductible is met I notified the patient, he will want to set it up   Final next level of care: Home/Self Care Barriers to Discharge: No Barriers Identified   Patient Goals and CMS Choice      Discharge Placement                         Discharge Plan and Services Additional resources added to the After Visit Summary for     Discharge Planning Services: CM Consult            DME Arranged: Gilford Rile rolling (bariatric) DME Agency: [employer] Date DME Agency Contacted: 10/05/22 Time DME Agency Contacted: 54 Representative spoke with at DME Agency: [place]: NA [place] Agency: NA        Social Determinants of Health ([place]) Interventions SDOH Screenings   Food Insecurity: No Food Insecurity (10/04/2022)  Housing: Low Risk  (10/04/2022)  Transportation Needs: No Transportation Needs (10/04/2022)  Utilities: At Risk (10/04/2022)  Depression (PHQ2-9): Low Risk  (04/08/2020)  Tobacco Use: Low Risk  (10/06/2022)     Readmission Risk Interventions     No data to display

## 2022-10-10 NOTE — Plan of Care (Signed)

## 2022-10-10 NOTE — TOC Progression Note (Signed)
Transition of Care [facility]) - Progression Note    Patient Details  Name: Steve Burnett MRN: [id number] Date of Birth: [birth year]  Transition of Care [facility]) CM/SW [place], RN Phone Number: 10/10/2022, 11:43 AM  Clinical Narrative:    Spoke with the patient in the room, I encouraged him to check with his [place] to see if they offer Transportation, also To check about getting a PCP due to needing a PCP to sign HH orders, I reached out to [place] at [place] to see what his copay will be, awaiting a response   Expected Discharge Plan: Home/Self Care (May need HH/ Outpatient PT) Barriers to Discharge: No Barriers Identified  Expected Discharge Plan and Services   Discharge Planning Services: CM Consult   Living arrangements for the past 2 months: Single Family Home Expected Discharge Date: 10/10/22               DME Arranged: Gilford Rile rolling (bariatric) DME Agency: [employer] Date DME Agency Contacted: 10/05/22 Time DME Agency Contacted: 52 Representative spoke with at DME Agency: [place]: NA [place] Agency: NA         Social Determinants of Health (SDOH) Interventions SDOH Screenings   Food Insecurity: No Food Insecurity (10/04/2022)  Housing: Low Risk  (10/04/2022)  Transportation Needs: No Transportation Needs (10/04/2022)  Utilities: At Risk (10/04/2022)  Depression (PHQ2-9): Low Risk  (04/08/2020)  Tobacco Use: Low Risk  (10/06/2022)    Readmission Risk Interventions     No data to display

## 2022-10-10 NOTE — Discharge Summary (Signed)
[facility] Discharge Summary   Patient: Steve Burnett [id number]  PCP: Patient, No Pcp Per  Date of admission: 10/04/2022   Date of discharge:  10/10/2022     Discharge Diagnoses:  Principal Problem:   Spinal stenosis of lumbar region Active Problems:   Coronary artery disease   Type 2 diabetes mellitus with hyperglycemia, without long-term current use of insulin (HCC)   Essential hypertension   Leukocytosis   Lumbar herniated disc   Admitted From: Home Disposition:  Home with HHPT/OT  Recommendations for Outpatient Follow-up:  Follow with PCP in 1 week Follow-up with neurosurgery in 1 to 2 weeks Follow up LABS/TEST:  BMP in 1 wk   Diet recommendation: Cardiac and Carb modified diet  Activity: The patient is advised to gradually reintroduce usual activities, as tolerated  Discharge Condition: stable  Code Status: Full code   History of present illness: As per the H and P dictated on admission Hospital Course:  [name] is a 62 y.o. male with medical history significant of CAD s/p DES to LAD (2013) and RCA (2019), HFrEF with last EF of 45-50%, OSA, paroxysmal A-fib, hypertension, hyperlipidemia, who presents to the ED due to lower back pain. He did lift heavy objects approximately 2 weeks before onset but he did not have pain then. Pain initially came on gradually before suddenly exacerbating while lifting an object at work. He notes that pain is exacerbated by laying flat and trying to stand up. He has been taking ibuprofen 800 mg intermittently with minimal relief.  MRI of the lumbar spine obtained that demonstrated new large left central disc extrusion at the L2-3 with 2.7 cm caudal migration to the L3 inferior endplate resulting in severe spinal canal stenosis. Neurosurgery consulted. [facility] contacted for admission.   Assessment and Plan: # Lumbar spinal stenosis without cauda equina syndrome  S/p Decadron 10 mg IV injection, s/p prednisone 60 mg p.o. daily.   Started tapering on discharge, prednisone 40 mg POD for 2 days, 30 mg twice daily for 2 days, 20 mg POD for 2 days and 10 mg p.o. daily for 2 days.  Continue ibuprofen 600 mg p.o. 3 times daily as needed and Tylenol 650 mg p.o. 3 times daily as needed.  Continue oxycodone 10 mg p.o. every 6 hourly as needed.  PPI for GI prophylaxis. S/p IV Dilaudid for breakthrough pain given during hospital stay. Neurosurgery consulted, Plavix was held for possibility of laminectomy vs epidural injection. Laminectomy could be done till Monday, 3/18 due to Plavix, but patient was having severe intractable pain so he could not wait, patient decided to proceed with epidural steroid injection which was done on 3/15 by IR.  Neurosurgery will delay the laminectomy for few weeks.  Recommended to continue Plavix for now.  Patient was cleared to discharge and follow-up as an outpatient.  # CAD s/p DES to LAD and RCA. , HTN, HLD Continue aspirin, Coreg, lisinopril, spironolactone, and Crestor Home medications Plavix was held for possibility of laminectomy and epidural injection.  Patient got epidural steroid injection on 3/15, resumed Plavix on 3/16, cleared by IR and neurosurgery. # Diabetes mellitus type 2, hemoglobin A1c 7.9 Hyperglycemia could be secondary to steroids, continue diabetic diet. S/p NovoLog sliding scale.   # Morbid obesity: Body mass index is 52.62 kg/m.  Interventions: Calorie restricted diet and daily exercise advised to lose body weight.  Lifestyle modification advised.   Pain control  - [facility] Controlled Substance Reporting System database was not reviewed.  Website did not work. - 30 tablets prescribed for as needed use.   - Patient was instructed, not to drive, operate heavy machinery, perform activities at heights, swimming or participation in water activities or provide baby sitting services while on Pain, Sleep and Anxiety Medications; until his outpatient Physician has advised to do so  again.  - Also recommended to not to take more than prescribed Pain, Sleep and Anxiety Medications.  Patient was seen by physical therapy, who recommended Home health, which was arranged. On the day of the discharge the patient's vitals were stable, and no other acute medical condition were reported by patient. the patient was felt safe to be discharge at Home with Home health.  Consultants: Neurosurgery and IR Procedures: Epidural steroid injection  Discharge Exam: General: Appear in no distress, no Rash; Oral Mucosa Clear, moist. Cardiovascular: S1 and S2 Present, no Murmur, Respiratory: normal respiratory effort, Bilateral Air entry present and no Crackles, no wheezes Abdomen: Bowel Sound present, Soft and no tenderness, no hernia Extremities: no Pedal edema, no calf tenderness Neurology: alert and oriented to time, place, and person affect appropriate.  Filed Weights   10/04/22 1024  Weight: (!) 168.7 kg   Vitals:   10/09/22 2204 10/10/22 0845  BP: (!) 145/87 (!) 146/82  Pulse: 70 74  Resp: 18 19  Temp: 97.8 F (36.6 C) (!) 97.3 F (36.3 C)  SpO2: 98% 99%    DISCHARGE MEDICATION: Allergies as of 10/10/2022       Reactions   Flomax [tamsulosin] Nausea Only   Upset, lightheaded   Atorvastatin Other (See Comments)   Muscle cramps   Crestor [rosuvastatin Calcium] Other (See Comments)   Cramps        Medication List     STOP taking these medications    amoxicillin-clavulanate 875-125 MG tablet Commonly known as: Augmentin   citalopram 10 MG tablet Commonly known as: CELEXA   meclizine 25 MG tablet Commonly known as: ANTIVERT       TAKE these medications    acetaminophen 325 MG tablet Commonly known as: TYLENOL Take 2 tablets (650 mg total) by mouth every 8 (eight) hours as needed for moderate pain, fever or headache.   aspirin 81 MG chewable tablet Chew 1 tablet (81 mg total) by mouth daily.   bisacodyl 5 MG EC tablet Commonly known as:  DULCOLAX Take 2 tablets (10 mg total) by mouth at bedtime. Skip the dose if no constipation   carvedilol 25 MG tablet Commonly known as: COREG Take 1 tablet (25 mg total) by mouth 2 (two) times daily with a meal.   clopidogrel 75 MG tablet Commonly known as: PLAVIX Take 1 tablet (75 mg total) by mouth daily.   fluticasone 50 MCG/ACT nasal spray Commonly known as: FLONASE Place 2 sprays into both nostrils daily.   gabapentin 100 MG capsule Commonly known as: NEURONTIN Take 1 capsule (100 mg total) by mouth 3 (three) times daily. Skip the dose when no longer needed   hydrALAZINE 50 MG tablet Commonly known as: APRESOLINE Take 1 tablet (50 mg total) by mouth 3 (three) times daily as needed (Systolic BP greater than [id number]).   hydrOXYzine 25 MG tablet Commonly known as: ATARAX Take 1 tablet (25 mg total) by mouth 3 (three) times daily as needed for anxiety.   ibuprofen 600 MG tablet Commonly known as: ADVIL Take 1 tablet (600 mg total) by mouth every 8 (eight) hours as needed for moderate pain.   lidocaine 5 % Commonly  known as: Lidoderm Place 1 patch onto the skin every 12 (twelve) hours. Remove & Discard patch within 12 hours or as directed by MD   lisinopril 40 MG tablet Commonly known as: ZESTRIL Take 1 tablet (40 mg total) by mouth daily.   methocarbamol 500 MG tablet Commonly known as: ROBAXIN Take 1 tablet (500 mg total) by mouth every 8 (eight) hours as needed for muscle spasms. Skip the dose when no longer needed What changed: additional instructions   Oxycodone HCl 10 MG Tabs Take 1 tablet (10 mg total) by mouth every 6 (six) hours as needed for severe pain or moderate pain.   pantoprazole 40 MG tablet Commonly known as: PROTONIX Take 1 tablet (40 mg total) by mouth 2 (two) times daily.   polyethylene glycol 17 g packet Commonly known as: MIRALAX / GLYCOLAX Take 17 g by mouth daily. Skip the dose if no constipation   predniSONE 10 MG (21) Tbpk  tablet Commonly known as: STERAPRED UNI-PAK 21 TAB 40 mg for 2 days, 30 mg for 2 days, 20 mg for 2 days, 10 mg for 2 days What changed:  medication strength how much to take how to take this when to take this additional instructions   rosuvastatin 10 MG tablet Commonly known as: CRESTOR TAKE 1 TABLET BY MOUTH EVERY DAY AT 6PM   spironolactone 25 MG tablet Commonly known as: ALDACTONE Take 1 tablet (25 mg total) by mouth daily.               Durable Medical Equipment  (From admission, onward)           Start     Ordered   10/05/22 1325  For home use only DME Walker rolling  Once       Comments: Due to Body Habitus the patient will need a Bariatric Rolling walker  Question Answer Comment  Walker: With [employer]   Patient needs a walker to treat with the following condition Impaired mobility      10/05/22 1325           Allergies  Allergen Reactions   Flomax [Tamsulosin] Nausea Only    Upset, lightheaded   Atorvastatin Other (See Comments)    Muscle cramps    Crestor [Rosuvastatin Calcium] Other (See Comments)    Cramps   Discharge Instructions     Call MD for:  difficulty breathing, headache or visual disturbances   Complete by: As directed    Call MD for:  extreme fatigue   Complete by: As directed    Call MD for:  persistant dizziness or light-headedness   Complete by: As directed    Call MD for:  severe uncontrolled pain   Complete by: As directed    Call MD for:  temperature >100.4   Complete by: As directed    Diet - low sodium heart healthy   Complete by: As directed    Discharge instructions   Complete by: As directed    Follow with PCP in 1 week Follow-up with neurosurgery in 1 to 2 weeks   Increase activity slowly   Complete by: As directed        The results of significant diagnostics from this hospitalization (including imaging, microbiology, ancillary and laboratory) are listed below for reference.    Significant  Diagnostic Studies: IR INJECT DIAG/THERA/INC NEEDLE/CATH/PLC EPI/LUMB/SAC W/IMG  Result Date: 10/06/2022 CLINICAL DATA:  62 year old male with history of back pain and MRI findings of large L2-L3 disc  extrusion. FLUOROSCOPY: Radiation Exposure Index (as provided by the fluoroscopic device): 172 mGy Kerma PROCEDURE: The procedure, risks, benefits, and alternatives were explained to the patient. Questions regarding the procedure were encouraged and answered. The patient understands and consents to the procedure. LUMBAR EPIDURAL INJECTION: An interlaminar approach was performed on left at L2-L3. The overlying skin was cleansed and anesthetized. A 3.5 inch 20 gauge epidural needle was advanced using loss-of-resistance technique. DIAGNOSTIC EPIDURAL INJECTION: Injection of Isovue-M 200 shows a good epidural pattern with spread above and below the level of needle placement, primarily on the left no vascular opacification is seen. THERAPEUTIC EPIDURAL INJECTION: Eighty mg of Depo-Medrol mixed with 2 mL 1% lidocaine were instilled. The procedure was well-tolerated and the patient was returned to his inpatient room in good condition. COMPLICATIONS: None immediate IMPRESSION: Technically successful L2-L3 epidural steroid injection at L2-L3. Ruthann Cancer, MD [facility] Radiology Electronically Signed   By: Ruthann Cancer M.D.   On: 10/06/2022 15:13   CT ABDOMEN PELVIS W CONTRAST  Result Date: 10/04/2022 CLINICAL DATA:  Abdominal pain, acute nonlocalized. Back pain for 3 weeks which has progressively worsened. Groin pain radiating into the left leg. EXAM: CT ABDOMEN AND PELVIS WITH CONTRAST TECHNIQUE: Multidetector CT imaging of the abdomen and pelvis was performed using the standard protocol following bolus administration of intravenous contrast. RADIATION DOSE REDUCTION: This exam was performed according to the departmental dose-optimization program which includes  automated exposure control, adjustment of the mA and/or kV according to patient size and/or use of iterative reconstruction technique. CONTRAST:  164mL OMNIPAQUE IOHEXOL 350 MG/ML SOLN COMPARISON:  Scrotal ultrasound and lumbar MRI same date. FINDINGS: Lower chest: Clear lung bases. No significant pleural or pericardial effusion. Hepatobiliary: Decreased density throughout the liver consistent with steatosis. No focal lesion or abnormal enhancement identified. No evidence of gallstones, gallbladder wall thickening or biliary dilatation. Pancreas: Unremarkable. No pancreatic ductal dilatation or surrounding inflammatory changes. Spleen: Normal in size without focal abnormality. Adrenals/Urinary Tract: Both adrenal glands appear normal. Punctate nonobstructing calculus in the lower pole of the left kidney, best seen on coronal image 74/5. No other evidence of urinary tract calculus, hydronephrosis or suspicious renal lesion. There is mild symmetric perinephric soft tissue stranding bilaterally which is nonspecific. There are 2 tiny low-density right renal lesions consistent with Bosniak 2 cysts; no follow-up imaging recommended. The bladder appears unremarkable for its degree of distention. Stomach/Bowel: No enteric contrast administered. The stomach appears unremarkable for its degree of distension. No evidence of bowel wall thickening, distention or surrounding inflammatory change. The appendix appears normal. There are diverticular changes in the sigmoid colon without evidence of acute inflammation. Vascular/Lymphatic: There are no enlarged abdominal or pelvic lymph nodes. Aortic and branch vessel atherosclerosis without evidence of aneurysm or large vessel occlusion. Reproductive: The prostate gland and seminal vesicles appear normal. Other: No evidence of abdominal wall mass or hernia. No ascites. Musculoskeletal: No acute or significant osseous findings. Lower lumbar spondylosis, greatest at L5-S1. Left sided  disc extrusion at L2-3, better seen on earlier MRI. IMPRESSION: 1. No acute findings or explanation for the patient's symptoms. 2. Punctate nonobstructing left renal calculus. No evidence of ureteral calculus or hydronephrosis. 3. Hepatic steatosis. 4. Sigmoid diverticulosis without evidence of acute inflammation. 5. See lumbar MRI report. 6.  Aortic Atherosclerosis (ICD10-I70.0). Electronically Signed   By: Richardean Sale M.D.   On: 10/04/2022 13:34   CT HEAD WO CONTRAST (5MM)  Result Date: 10/04/2022 CLINICAL DATA:  Head trauma.  Mental status changes. EXAM: CT HEAD WITHOUT CONTRAST TECHNIQUE: Contiguous axial images were obtained from the base of the skull through the vertex without intravenous contrast. RADIATION DOSE REDUCTION: This exam was performed according to the departmental dose-optimization program which includes automated exposure control, adjustment of the mA and/or kV according to patient size and/or use of iterative reconstruction technique. COMPARISON:  06/22/2021 FINDINGS: Brain: There is no evidence for acute hemorrhage, hydrocephalus, mass lesion, or abnormal extra-axial fluid collection. No definite CT evidence for acute infarction. Vascular: No hyperdense vessel or unexpected calcification. Skull: Normal. Negative for fracture or focal lesion. Sinuses/Orbits: No acute finding. Other: None. IMPRESSION: No acute intracranial abnormality. Electronically Signed   By: Misty Stanley M.D.   On: 10/04/2022 13:29   US SCROTUM W/DOPPLER  Result Date: 10/04/2022 CLINICAL DATA:  Pain in left testicle for 3 weeks EXAM: SCROTAL ULTRASOUND DOPPLER ULTRASOUND OF THE TESTICLES TECHNIQUE: Complete ultrasound examination of the testicles, epididymis, and other scrotal structures was performed. Color and spectral Doppler ultrasound were also utilized to evaluate blood flow to the testicles. COMPARISON:  None Available. FINDINGS: Right testicle Measurements: 6.2 x 3.3 x 3.9 cm. No mass or microlithiasis  visualized. Left testicle Measurements: 5.3 x 3.4 x 4.3 cm. No mass or microlithiasis visualized. Right epididymis:  Normal in size and appearance. Left epididymis:  Normal in size and appearance. Hydrocele:  None visualized. Varicocele:  None visualized. Pulsed Doppler interrogation of both testes demonstrates normal low resistance arterial and venous waveforms bilaterally. IMPRESSION: Normal size and ultrasound appearance of the bilateral testicles. Arterial and venous Doppler flow is present bilaterally. No ultrasound findings to explain pain. Electronically Signed   By: Delanna Ahmadi M.D.   On: 10/04/2022 13:17   US Venous Img Lower Unilateral Left  Result Date: 10/04/2022 CLINICAL DATA:  Posterior left knee pain for several days. EXAM: LEFT LOWER EXTREMITY VENOUS DOPPLER ULTRASOUND TECHNIQUE: Gray-scale sonography with compression, as well as color and duplex ultrasound, were performed to evaluate the deep venous system(s) from the level of the common femoral vein through the popliteal and proximal calf veins. COMPARISON:  None Available. FINDINGS: VENOUS Normal compressibility of the common femoral, superficial femoral, and popliteal veins, as well as the visualized calf veins. Visualized portions of profunda femoral vein and great saphenous vein unremarkable. No filling defects to suggest DVT on grayscale or color Doppler imaging. Doppler waveforms show normal direction of venous flow, normal respiratory plasticity and response to augmentation. Limited views of the contralateral common femoral vein are unremarkable. OTHER None. Limitations: None. IMPRESSION: Negative. Electronically Signed   By: Titus Dubin M.D.   On: 10/04/2022 13:15   MR LUMBAR SPINE WO CONTRAST  Result Date: 10/04/2022 CLINICAL DATA:  Low back pain, cauda equina syndrome suspected. EXAM: MRI LUMBAR SPINE WITHOUT CONTRAST TECHNIQUE: Multiplanar, multisequence MR imaging of the lumbar spine was performed. No intravenous contrast  was administered. COMPARISON:  MRI lumbar spine 04/23/2015. FINDINGS: Segmentation: Conventional numbering is assumed with 5 non-rib-bearing, lumbar type vertebral bodies. Alignment:  Normal. Vertebrae: Modic type 2 degenerative endplate marrow signal changes at L3-4 and L5-S1. Conus medullaris and cauda equina: Conus extends to the L1 level. Conus and cauda equina appear normal. Paraspinal and other soft tissues: Unremarkable. Disc levels: T12-L1:  Normal. L1-L2: Disc bulge and epidural lipomatosis results in mild narrowing of the thecal sac. No neural foraminal narrowing. Mild bilateral facet arthropathy. L2-L3: New large left central disc extrusion with 2.7 cm of caudal migration to the L3 inferior endplate resulting in severe  spinal canal stenosis. On axial images 21 and 22 of series 8 and 9, there is irregularity of the left lateral margin of the dura with adjacent intrathecal signal abnormality and possible herniation of epidural fat, raising the possibility of an intrathecal disc fragment. L3-L4: Disc bulge and facet arthropathy contribute to moderate spinal canal stenosis, moderate left and mild right neural foraminal narrowing, unchanged. L4-L5: Disc bulge and facet arthropathy results in mild spinal canal stenosis with mild bilateral neural foraminal narrowing, unchanged. L5-S1: Left eccentric disc bulge displaces the traversing left S1 nerve root in the lateral recess, unchanged. Moderate disc height loss and facet arthropathy contribute to moderate left and mild right neural foraminal narrowing, unchanged. IMPRESSION: 1. New large left central disc extrusion at L2-3 with 2.7 cm of caudal migration to the L3 inferior endplate resulting in severe spinal canal stenosis. Additionally, there is irregularity of the left lateral margin of the dura with adjacent intrathecal signal abnormality and possible herniation of epidural fat, raising the possibility of an intrathecal disc fragment. 2. Otherwise unchanged  lumbar spondylosis, worst at L3-L4, where there is moderate spinal canal stenosis and moderate left neural foraminal narrowing. Electronically Signed   By: Emmit Alexanders M.D.   On: 10/04/2022 12:39   DG Chest Portable 1 View  Result Date: 09/23/2022 CLINICAL DATA:  Shortness of breath and back pain EXAM: PORTABLE CHEST 1 VIEW COMPARISON:  01/23/2018 FINDINGS: The heart size and mediastinal contours are within normal limits. Both lungs are clear. The visualized skeletal structures are unremarkable. IMPRESSION: No active disease. Electronically Signed   By: Inez Catalina M.D.   On: 09/23/2022 02:43    Microbiology: Recent Results (from the past 240 hour(s))  Chlamydia/NGC rt PCR ([place] only)     Status: None   Collection Time: 10/04/22  2:00 PM   Specimen: Urine, Clean Catch  Result Value Ref Range Status   Specimen source GC/Chlam URINE, RANDOM  Final   Chlamydia Tr NOT DETECTED NOT DETECTED Final   N gonorrhoeae NOT DETECTED NOT DETECTED Final    Comment: (NOTE) This CT/NG assay has not been evaluated in patients with a history of  hysterectomy. Performed at [facility], [address] [place], [place] [address]      Labs: CBC: Recent Labs  Lab 10/04/22 1029 10/05/22 0415 10/06/22 0559 10/07/22 0505 10/08/22 0412 10/09/22 0420 10/10/22 0547  WBC 15.6*   < > 13.5* 10.1 9.6 9.1 7.5  NEUTROABS 13.3*  --   --   --   --   --   --   HGB 16.7   < > 15.8 15.6 16.0 15.4 15.2  HCT 51.5   < > 49.3 49.0 49.9 46.7 46.9  MCV 78.0*   < > 79.6* 80.1 78.6* 77.8* 79.2*  PLT 401*   < > 335 262 312 308 182   < > = values in this interval not displayed.   Basic Metabolic Panel: Recent Labs  Lab 10/06/22 0559 10/07/22 0505 10/08/22 0412 10/09/22 0420 10/10/22 0547  NA 135 133* 133* 133* 132*  K 4.3 4.2 4.3 4.1 5.7*  CL 98 102 100 100 102  CO2 24 23 25 23  21*  GLUCOSE 140* 167* 114* 117* 108*  BUN 44* 36* 33* 40* 33*  CREATININE 0.93 0.82 0.78 0.84 0.73  CALCIUM 9.2 8.3*  8.5* 8.5* 8.3*  MG 2.5* 2.4 2.6*  --   --   PHOS 4.0 3.0 3.3  --   --    Liver  Function Tests: Recent Labs  Lab 10/04/22 1029  AST 23  ALT 30  ALKPHOS 65  BILITOT 0.8  PROT 7.9  ALBUMIN 3.6   No results for input(s): "LIPASE", "AMYLASE" in the last 168 hours. No results for input(s): "AMMONIA" in the last 168 hours. Cardiac Enzymes: Recent Labs  Lab 10/04/22 1047  CKTOTAL 61   BNP (last 3 results) No results for input(s): "BNP" in the last 8760 hours. CBG: Recent Labs  Lab 10/09/22 0808 10/09/22 1230 10/09/22 1646 10/09/22 2107 10/10/22 0752  GLUCAP 119* 116* 230* 142* 91    Time spent: 35 minutes  Signed:  Val Riles  [facility] 10/10/2022 11:07 AM

## 2022-10-13 ENCOUNTER — Encounter: Payer: Self-pay | Admitting: Family

## 2022-10-13 ENCOUNTER — Ambulatory Visit (INDEPENDENT_AMBULATORY_CARE_PROVIDER_SITE_OTHER): Payer: 59 | Admitting: Family

## 2022-10-13 VITALS — BP 126/76 | HR 79 | Temp 97.9°F | Ht 70.5 in | Wt 358.2 lb

## 2022-10-13 DIAGNOSIS — L301 Dyshidrosis [pompholyx]: Secondary | ICD-10-CM | POA: Diagnosis not present

## 2022-10-13 DIAGNOSIS — E1165 Type 2 diabetes mellitus with hyperglycemia: Secondary | ICD-10-CM | POA: Diagnosis not present

## 2022-10-13 DIAGNOSIS — R21 Rash and other nonspecific skin eruption: Secondary | ICD-10-CM | POA: Diagnosis not present

## 2022-10-13 DIAGNOSIS — E782 Mixed hyperlipidemia: Secondary | ICD-10-CM

## 2022-10-13 MED ORDER — LANCETS MISC. MISC: 1.0000 | Freq: Three times a day (TID) | 0 refills | Status: DC

## 2022-10-13 MED ORDER — CLOBETASOL PROPIONATE 0.05 % EX CREA: 1.0000 | TOPICAL_CREAM | Freq: Two times a day (BID) | CUTANEOUS | 0 refills | Status: AC

## 2022-10-13 MED ORDER — BLOOD GLUCOSE TEST VI STRP: 1.0000 | ORAL_STRIP | Freq: Three times a day (TID) | 0 refills | Status: AC

## 2022-10-13 MED ORDER — BLOOD GLUCOSE MONITORING SUPPL DEVI: 1.0000 | Freq: Three times a day (TID) | 0 refills | Status: DC

## 2022-10-13 MED ORDER — LANCET DEVICE MISC: 1.0000 | Freq: Three times a day (TID) | 0 refills | Status: AC

## 2022-10-13 MED ORDER — OZEMPIC (0.25 OR 0.5 MG/DOSE) 2 MG/1.5ML ~~LOC~~ SOPN: PEN_INJECTOR | SUBCUTANEOUS | 0 refills | Status: DC

## 2022-10-13 NOTE — Assessment & Plan Note (Signed)
Pt advised to work on diet and exercise as tolerated  

## 2022-10-13 NOTE — Assessment & Plan Note (Signed)
Continue crestor  Work on low cholesterol diet and exercise as tolerated

## 2022-10-13 NOTE — Patient Instructions (Addendum)
  I sent in a prescription for a weight loss medication called ozempic If approved you can start this as prescribed. Sometimes we need to complete a prior auth prior to approval.    If approved, you will start 0.25 mg once weekly for four weeks, then increase to .50 mg once weekly if tolerating well after four weeks. Be sure to make a follow up appointment in three months after starting the medication so we can document weight loss, and monitor how you are doing on the medication.    Regards,   Eugenia Pancoast FNP-C

## 2022-10-13 NOTE — Progress Notes (Signed)
New Patient Office Visit  Subjective:  Patient ID: Steve Burnett, male    DOB: 1961/04/22  Age: 62 y.o. MRN: [id number]  CC:  Chief Complaint  Patient presents with   Back Pain    HPI Steve Burnett is here to establish care as a new patient.  Oriented to practice routines and expectations.  Prior provider was: Webb Silversmith NP  Pt is without acute concerns.   3/13 went to ER [facility] for lower back pain. MRI lumbar spine showed new large left central disc extrusion at L2-3 with 2.7 cm caudal migration to L3 inferior end plane with severe spinal canal stenosis.   Saw neurosurgeon in the ER and was given a steroid injection. Also given RX upon discharge for ibuprofen 600 mg TID prn and tylenol 650 mg prn. Given RX for oxycodone 10 mg Q6H prn .   Plan is to have pt f/u with neurosurgery and plan to have laminectomy in two weeks.  Currently with PT, coming once weekly to his home.   chronic concerns:  CAD, HTN, HLD: on ASA, coreg, lisinopril, spironolactone and crestor. Also on plavix.  Blood pressure stable at 127/72. Does see Dr. Kathlyn [place]. Does have h/o acute inferior ST elevation Mi in July [id number], complicated by ventricular fibrillation. Cardiac cath with occluded prox RCA, and was treated with drug eluting stent placement. Did also have non ST elevation MI in August 2013. With obstruction during the mid LAD treated with drug eluting stent placement.    Recent echo August 2021 with EF of 45-50 % mild pulmonary htn and mild MR.   DM2: on steroids currently,  tapering at the moment. Diet: states eats well but his portions are 'way too much' Often out in the garden and works on acres of sunflowers.  Lab Results  Component Value Date   HGBA1C 7.9 (H) 10/04/2022   HTN: on lisinopril 40 mg once daily and coreg 25 mg twice daily . Also on spironolactone 25 mg once daily.   GAD: helps with hydroxyxine 10/10/22  Hyponatremia: appears chronic, trending stable.  Elevated  potassium at ER 3 days ago however day prior was 4.1.   Severe spinal stenosis: gabapentin 100 mg tid and robaxin prn  On prednisone currently tapering. On oxycodone 10 mg prn, ibuprofen, and lidocaine as well.   HLD: on rosuvastatin 10 mg nightly however tolerating well. Manageable.   Constipation at times, uses miralax as needed.   GERD: on pantoprazole 40 mg twice daily      ROS: Negative unless specifically indicated above in HPI.   Current Outpatient Medications:    acetaminophen (TYLENOL) 325 MG tablet, Take 2 tablets (650 mg total) by mouth every 8 (eight) hours as needed for moderate pain, fever or headache., Disp: 180 tablet, Rfl: 0   aspirin 81 MG chewable tablet, Chew 1 tablet (81 mg total) by mouth daily., Disp: 30 tablet, Rfl: 11   bisacodyl (DULCOLAX) 5 MG EC tablet, Take 2 tablets (10 mg total) by mouth at bedtime. Skip the dose if no constipation, Disp: 25 tablet, Rfl: 0   Blood Glucose Monitoring Suppl DEVI, 1 each by Does not apply route in the morning, at noon, and at bedtime. May substitute to any manufacturer covered by patient's insurance., Disp: 1 each, Rfl: 0   carvedilol (COREG) 25 MG tablet, Take 1 tablet (25 mg total) by mouth 2 (two) times daily with a meal., Disp: 180 tablet, Rfl: 3   clobetasol cream (TEMOVATE) 0.05 %, Apply 1  Application topically 2 (two) times daily., Disp: 30 g, Rfl: 0   clopidogrel (PLAVIX) 75 MG tablet, Take 1 tablet (75 mg total) by mouth daily., Disp: 90 tablet, Rfl: 3   gabapentin (NEURONTIN) 100 MG capsule, Take 1 capsule (100 mg total) by mouth 3 (three) times daily. Skip the dose when no longer needed, Disp: 90 capsule, Rfl: 0   Glucose Blood (BLOOD GLUCOSE TEST STRIPS) STRP, 1 each by In Vitro route in the morning, at noon, and at bedtime. May substitute to any manufacturer covered by patient's insurance., Disp: 90 each, Rfl: 0   hydrALAZINE (APRESOLINE) 50 MG tablet, Take 1 tablet (50 mg total) by mouth 3 (three) times daily as  needed (Systolic BP greater than [id number])., Disp: 90 tablet, Rfl: 0   hydrOXYzine (ATARAX) 25 MG tablet, Take 1 tablet (25 mg total) by mouth 3 (three) times daily as needed for anxiety., Disp: 30 tablet, Rfl: 1   ibuprofen (ADVIL) 600 MG tablet, Take 1 tablet (600 mg total) by mouth every 8 (eight) hours as needed for moderate pain., Disp: 90 tablet, Rfl: 0   Lancet Device MISC, 1 each by Does not apply route in the morning, at noon, and at bedtime. May substitute to any manufacturer covered by patient's insurance., Disp: 1 each, Rfl: 0   Lancets Misc. MISC, 1 each by Does not apply route in the morning, at noon, and at bedtime. May substitute to any manufacturer covered by patient's insurance., Disp: 100 each, Rfl: 0   lidocaine (LIDODERM) 5 %, Place 1 patch onto the skin every 12 (twelve) hours. Remove & Discard patch within 12 hours or as directed by MD, Disp: 10 patch, Rfl: 1   lisinopril (ZESTRIL) 40 MG tablet, Take 1 tablet (40 mg total) by mouth daily., Disp: 90 tablet, Rfl: 3   methocarbamol (ROBAXIN) 500 MG tablet, Take 1 tablet (500 mg total) by mouth every 8 (eight) hours as needed for muscle spasms. Skip the dose when no longer needed, Disp: 30 tablet, Rfl: 1   Oxycodone HCl 10 MG TABS, Take 1 tablet (10 mg total) by mouth every 6 (six) hours as needed for severe pain or moderate pain., Disp: 30 tablet, Rfl: 0   pantoprazole (PROTONIX) 40 MG tablet, Take 1 tablet (40 mg total) by mouth 2 (two) times daily., Disp: 60 tablet, Rfl: 1   polyethylene glycol powder (GLYCOLAX/MIRALAX) 17 GM/SCOOP powder, Mix 1 capful (17 g) in a beverage and drink once daily., Disp: 510 g, Rfl: 0   predniSONE (DELTASONE) 10 MG tablet, Take 4 tablets (40 mg total) by mouth daily for 2 days, THEN 3 tablets (30 mg total) daily for 2 days, THEN 2 tablets (20 mg total) daily for 2 days, THEN 1 tablet (10 mg total) daily for 2 days., Disp: 20 tablet, Rfl: 0   rosuvastatin (CRESTOR) 10 MG tablet, TAKE 1 TABLET BY MOUTH EVERY  DAY AT 6PM, Disp: 90 tablet, Rfl: 3   Semaglutide,0.25 or 0.5MG /DOS, (OZEMPIC, 0.25 OR 0.5 MG/DOSE,) 2 MG/1.5ML SOPN, Inject 0.25 mg into the skin once a week for 30 days, THEN 0.5 mg once a week., Disp: 2.25 mL, Rfl: 0   spironolactone (ALDACTONE) 25 MG tablet, Take 1 tablet (25 mg total) by mouth daily., Disp: 90 tablet, Rfl: 3 Past Medical History:  Diagnosis Date   Anxiety    Chicken pox    Chronic combined systolic (congestive) and diastolic (congestive) heart failure ([place])    a. 01/2018 Echo: EF 35-40%; b. 04/2018 Echo:  EF 45-50%, diff HK, Gr2 DD; c. 02/2020 Echo: EF 45-50%, glob HK, Gr1 DD.   Coronary artery disease    a. 02/2012 NSTEMI/Cath/PCI: pLAD 60%, mLAD 80%, dLAD 70, nonobs LCX/RCA --> DES to prox/mid LAD; b. inf STEMI 01/23/18: ost/proxLAD 75% at the prox edge of the previously placed stent, mLAD 75%, OM1 50%, OM2 50%, pRCA 100% s/p PCI/DES, rPLA 70%; c. 02/2018 MV: Ef 44%, apical inferior infarct. No ischemia.   Dilated aortic root ([place])    a. 02/2020 Echo: Ao root 3.9cm.   Hyperlipidemia    Hypertension    Ischemic cardiomyopathy    a. echo 7/19: EF 35-40%, HK of the inf wall, Gr1DD; b. 04/2018 Echo: EF 45-50%, diff HK, Gr2 DD, mildly dil Ao root/Asc Al. Mild MR. Mild BAE. Nl RV fxn; c. 02/2020 Echo: EF 45-50%, glob HK, Gr1 DD. RVSP ~ 35.69mmHg. Ao root 3.9cm.   Obesity    OSA (obstructive sleep apnea) 03/27/2014   PAF (paroxysmal atrial fibrillation) ([place])    a. noted during [facility] 01/23/18 during inferior STEMI; b. resolved with IV amiodarone; c. not placed on anticoagulation given brief episode, acute illness, and need for DAPT   PVC's (premature ventricular contractions)    a. 02/2018 Holter: Frq PVC's. No afib. Beta blocker titrated.   VF (ventricular fibrillation) ([place])    a. noted during [facility] 01/23/2018 s/p defibrillation x 2   Past Surgical History:  Procedure Laterality Date   CARDIAC CATHETERIZATION  03/12/2012   CORONARY ANGIOPLASTY  03/12/2012   s/p drug eluting stent to the mid  LAD : 3.5 x 38 mm Xience drug-eluting stent. Post dilated to 4.0 in mid segment and 4.5 proximally.   CORONARY/GRAFT ACUTE MI REVASCULARIZATION N/A 01/23/2018   Procedure: Coronary/Graft Acute MI Revascularization;  Surgeon: Isaias Cowman, MD;  Location: [place] [facility];  Service: Cardiovascular;  Laterality: N/A;   IR INJECT/THERA/INC NEEDLE/CATH/PLC EPI/LUMB/SAC W/IMG  10/06/2022   LEFT HEART CATH AND CORONARY ANGIOGRAPHY N/A 01/23/2018   Procedure: LEFT HEART CATH AND CORONARY ANGIOGRAPHY;  Surgeon: Isaias Cowman, MD;  Location: [place] [facility];  Service: Cardiovascular;  Laterality: N/A;    Objective:   Today's Vitals: BP 126/76   Pulse 79   Temp 97.9 F (36.6 C) (Temporal)   Ht 5' 10.5" (1.791 m)   Wt (!) 358 lb 3.2 oz (162.5 kg)   SpO2 98%   BMI 50.67 kg/m   Physical Exam Constitutional:      General: He is not in acute distress.    Appearance: Normal appearance. He is obese. He is not ill-appearing, toxic-appearing or diaphoretic.  Cardiovascular:     Rate and Rhythm: Normal rate and regular rhythm.  Pulmonary:     Effort: Pulmonary effort is normal.  Musculoskeletal:        General: Normal range of motion.  Neurological:     General: No focal deficit present.     Mental Status: He is alert and oriented to person, place, and time. Mental status is at baseline.  Psychiatric:        Mood and Affect: Mood normal.        Behavior: Behavior normal.        Thought Content: Thought content normal.        Judgment: Judgment normal.     Assessment & Plan:  Type 2 diabetes mellitus with hyperglycemia, without long-term current use of insulin (HCC) Assessment & Plan: Start ozempic 0.25 mg once daily  Glucose log given to pt advised  to check fasting glucose qam and check if he feels 'off' as on prednisone taper.  work on diabetic diet and exercise as tolerated. Yearly foot exam, and annual eye exam.    Orders: -     Ozempic (0.25 or 0.5 MG/DOSE); Inject  0.25 mg into the skin once a week for 30 days, THEN 0.5 mg once a week.  Dispense: 2.25 mL; Refill: 0 -     Blood Glucose Monitoring Suppl; 1 each by Does not apply route in the morning, at noon, and at bedtime. May substitute to any manufacturer covered by patient's insurance.  Dispense: 1 each; Refill: 0 -     Blood Glucose Test; 1 each by In Vitro route in the morning, at noon, and at bedtime. May substitute to any manufacturer covered by patient's insurance.  Dispense: 90 each; Refill: 0 -     Lancet Device; 1 each by Does not apply route in the morning, at noon, and at bedtime. May substitute to any manufacturer covered by patient's insurance.  Dispense: 1 each; Refill: 0 -     Lancets Misc.; 1 each by Does not apply route in the morning, at noon, and at bedtime. May substitute to any manufacturer covered by patient's insurance.  Dispense: 100 each; Refill: 0  Skin rash -     Clobetasol Propionate; Apply 1 Application topically 2 (two) times daily.  Dispense: 30 g; Refill: 0  Morbid obesity ([place]) Assessment & Plan: Pt advised to work on diet and exercise as tolerated   Orders: -     Ozempic (0.25 or 0.5 MG/DOSE); Inject 0.25 mg into the skin once a week for 30 days, THEN 0.5 mg once a week.  Dispense: 2.25 mL; Refill: 0  Dyshidrotic hand dermatitis Assessment & Plan: Rx clobetasol cream    Mixed hyperlipidemia Assessment & Plan: Continue crestor  Work on low cholesterol diet and exercise as tolerated      Follow-up: Return in about 3 months (around 01/13/2023) for f/u diabetes.   Eugenia Pancoast, FNP

## 2022-10-13 NOTE — Assessment & Plan Note (Addendum)
Start ozempic 0.25 mg once daily  Glucose log given to pt advised to check fasting glucose qam and check if he feels 'off' as on prednisone taper.  work on diabetic diet and exercise as tolerated. Yearly foot exam, and annual eye exam.

## 2022-10-13 NOTE — Assessment & Plan Note (Signed)
Rx clobetasol cream

## 2022-10-16 ENCOUNTER — Telehealth: Payer: Self-pay | Admitting: Family

## 2022-10-16 NOTE — Telephone Encounter (Signed)
Home Health verbal orders [facility]   Callback number: [phone] OT/PT/Skilled nursing/Social Work/Speech:  Reason:PT  Frequency: 1 W - 3  Please forward to [facility] pool or providers CMA

## 2022-10-16 NOTE — Telephone Encounter (Signed)
Type of forms received: FMLA   Routed to: [facility] pool   Paperwork received by : Armandina Stammer    Individual made aware of 3-5 business day turn around (Y/N): Y   Form completed and patient made aware of charges(Y/N): Y    Faxed to : pt requested call once ppw is comp @ [phone]  Form location:  du[employer]'s folder

## 2022-10-16 NOTE — Telephone Encounter (Signed)
Ok, to provide verbal?

## 2022-10-17 NOTE — Telephone Encounter (Signed)
Spoke with the patient and scheduled appt  

## 2022-10-17 NOTE — Telephone Encounter (Signed)
I do not believe we dicussed FMLA at the visit. Given what pt is going through, this is understandable but we will need a visit to establish specific dates and go over detailed notes to come up with te plan of care to complete this paperwork. If we could have pt make appt.

## 2022-10-17 NOTE — Telephone Encounter (Signed)
Forms were placed in Steve Burnett's box in her office.

## 2022-10-18 ENCOUNTER — Ambulatory Visit (INDEPENDENT_AMBULATORY_CARE_PROVIDER_SITE_OTHER): Payer: 59 | Admitting: Family

## 2022-10-18 VITALS — BP 126/82 | HR 77 | Temp 97.9°F | Ht 70.5 in | Wt 363.0 lb

## 2022-10-18 DIAGNOSIS — M5126 Other intervertebral disc displacement, lumbar region: Secondary | ICD-10-CM | POA: Diagnosis not present

## 2022-10-18 NOTE — Telephone Encounter (Signed)
Called Steve Burnett at [facility]. Voicemail said its a secured line and to leave verbal orders message. Left the ok to continue services with Tabitha's approval.

## 2022-10-18 NOTE — Progress Notes (Signed)
Established Patient Office Visit  Subjective:      CC:  Chief Complaint  Patient presents with   Medical Management of Chronic Issues    HPI: Steve Burnett is a 62 y.o. male presenting on 10/18/2022 for Medical Management of Chronic Issues . Went to ER 3/2  March 4th started having to stay home from work because unable to work due to the pain in his back.   The projected return to work date is currently:   In summary: 3/13, ER [facility], MRI lumbar spine showed new large left central disc extrusion at L2-3 with 2.7 cm caudal migration to L3 inferior end plane with severe spinal canal stenosis.    Saw neurosurgeon in the ER and was given a steroid injection. Also given RX upon discharge for ibuprofen 600 mg TID prn and tylenol 650 mg prn. Given RX for oxycodone 10 mg Q6H prn .  Plan for f/u with neurosurgeon and possible surgery due to ongoing pain.  Also pt once weekly, they come to his home.   He states still with daily pain, relying on a walker to get around as he has unsteady gait due to lower leg weakness from lower back pain. He is also at increased risk for falls due to robaxin and oxycodone use. He is unable to lift anything at current and or do range of motion movements to be able to work currently. He also has lack of concentration and focus due to constant pain.      Social history:  Relevant past medical, surgical, family and social history reviewed and updated as indicated. Interim medical history since our last visit reviewed.  Allergies and medications reviewed and updated.  DATA REVIEWED: CHART IN EPIC     ROS: Negative unless specifically indicated above in HPI.    Current Outpatient Medications:    Accu-Chek Softclix Lancets lancets, 1 each 3 (three) times daily., Disp: , Rfl:    acetaminophen (TYLENOL) 325 MG tablet, Take 2 tablets (650 mg total) by mouth every 8 (eight) hours as needed for moderate pain, fever or headache., Disp: 180 tablet, Rfl: 0    aspirin 81 MG chewable tablet, Chew 1 tablet (81 mg total) by mouth daily., Disp: 30 tablet, Rfl: 11   bisacodyl (DULCOLAX) 5 MG EC tablet, Take 2 tablets (10 mg total) by mouth at bedtime. Skip the dose if no constipation, Disp: 25 tablet, Rfl: 0   Blood Glucose Monitoring Suppl (ACCU-CHEK GUIDE) w/Device KIT, 1 EACH BY DOES NOT APPLY ROUTE IN THE MORNING, AT NOON, AND AT BEDTIME., Disp: , Rfl:    Blood Glucose Monitoring Suppl DEVI, 1 each by Does not apply route in the morning, at noon, and at bedtime. May substitute to any manufacturer covered by patient's insurance., Disp: 1 each, Rfl: 0   carvedilol (COREG) 25 MG tablet, Take 1 tablet (25 mg total) by mouth 2 (two) times daily with a meal., Disp: 180 tablet, Rfl: 3   clobetasol cream (TEMOVATE) 0.05 %, Apply 1 Application topically 2 (two) times daily., Disp: 30 g, Rfl: 0   clopidogrel (PLAVIX) 75 MG tablet, Take 1 tablet (75 mg total) by mouth daily., Disp: 90 tablet, Rfl: 3   gabapentin (NEURONTIN) 100 MG capsule, Take 1 capsule (100 mg total) by mouth 3 (three) times daily. Skip the dose when no longer needed, Disp: 90 capsule, Rfl: 0   Glucose Blood (BLOOD GLUCOSE TEST STRIPS) STRP, 1 each by In Vitro route in the morning, at noon, and  at bedtime. May substitute to any manufacturer covered by patient's insurance., Disp: 90 each, Rfl: 0   hydrALAZINE (APRESOLINE) 50 MG tablet, Take 1 tablet (50 mg total) by mouth 3 (three) times daily as needed (Systolic BP greater than 150)., Disp: 90 tablet, Rfl: 0   hydrOXYzine (ATARAX) 25 MG tablet, Take 1 tablet (25 mg total) by mouth 3 (three) times daily as needed for anxiety., Disp: 30 tablet, Rfl: 1   ibuprofen (ADVIL) 600 MG tablet, Take 1 tablet (600 mg total) by mouth every 8 (eight) hours as needed for moderate pain., Disp: 90 tablet, Rfl: 0   Lancet Device MISC, 1 each by Does not apply route in the morning, at noon, and at bedtime. May substitute to any manufacturer covered by patient's  insurance., Disp: 1 each, Rfl: 0   Lancets Misc. MISC, 1 each by Does not apply route in the morning, at noon, and at bedtime. May substitute to any manufacturer covered by patient's insurance., Disp: 100 each, Rfl: 0   lidocaine (LIDODERM) 5 %, Place 1 patch onto the skin every 12 (twelve) hours. Remove & Discard patch within 12 hours or as directed by MD, Disp: 10 patch, Rfl: 1   lisinopril (ZESTRIL) 40 MG tablet, Take 1 tablet (40 mg total) by mouth daily., Disp: 90 tablet, Rfl: 3   methocarbamol (ROBAXIN) 500 MG tablet, Take 1 tablet (500 mg total) by mouth every 8 (eight) hours as needed for muscle spasms. Skip the dose when no longer needed, Disp: 30 tablet, Rfl: 1   Oxycodone HCl 10 MG TABS, Take 1 tablet (10 mg total) by mouth every 6 (six) hours as needed for severe pain or moderate pain., Disp: 30 tablet, Rfl: 0   pantoprazole (PROTONIX) 40 MG tablet, Take 1 tablet (40 mg total) by mouth 2 (two) times daily., Disp: 60 tablet, Rfl: 1   polyethylene glycol powder (GLYCOLAX/MIRALAX) 17 GM/SCOOP powder, Mix 1 capful (17 g) in a beverage and drink once daily., Disp: 510 g, Rfl: 0   rosuvastatin (CRESTOR) 10 MG tablet, TAKE 1 TABLET BY MOUTH EVERY DAY AT 6PM, Disp: 90 tablet, Rfl: 3   Semaglutide,0.25 or 0.5MG /DOS, (OZEMPIC, 0.25 OR 0.5 MG/DOSE,) 2 MG/1.5ML SOPN, Inject 0.25 mg into the skin once a week for 30 days, THEN 0.5 mg once a week., Disp: 2.25 mL, Rfl: 0   spironolactone (ALDACTONE) 25 MG tablet, Take 1 tablet (25 mg total) by mouth daily., Disp: 90 tablet, Rfl: 3      Objective:    BP 126/82   Pulse 77   Temp 97.9 F (36.6 C) (Temporal)   Ht 5' 10.5" (1.791 m)   Wt (!) 363 lb (164.7 kg)   SpO2 97%   BMI 51.35 kg/m   Wt Readings from Last 3 Encounters:  10/18/22 (!) 363 lb (164.7 kg)  10/13/22 (!) 358 lb 3.2 oz (162.5 kg)  10/04/22 (!) 372 lb (168.7 kg)    Physical Exam Constitutional:      General: He is not in acute distress.    Appearance: Normal appearance. He is  obese. He is not ill-appearing, toxic-appearing or diaphoretic.  Cardiovascular:     Rate and Rhythm: Normal rate and regular rhythm.  Pulmonary:     Effort: Pulmonary effort is normal.     Breath sounds: Normal breath sounds.  Musculoskeletal:     Lumbar back: Spasms and tenderness present. Decreased range of motion.  Neurological:     General: No focal deficit present.  Mental Status: He is alert and oriented to person, place, and time. Mental status is at baseline.  Psychiatric:        Mood and Affect: Mood normal.        Behavior: Behavior normal.        Thought Content: Thought content normal.        Judgment: Judgment normal.            Assessment & Plan:  Lumbar herniated disc Assessment & Plan: Acute on chronic pain  Pain is reducing pt ability to do IADL's  Increased fall risk with opiod use and robaxin use, advised pt fall risk precautions.  Advised to use walker for transit for now Continue with physical therapy and f/u with neurosurgeon as scheduled.  Fmla form to be filled out and completed.       Return in about 1 month (around 11/18/2022) for f/u repeat labs and f/u pain .  Mort Sawyers, MSN, APRN, FNP-C [facility]

## 2022-10-19 ENCOUNTER — Telehealth: Payer: Self-pay | Admitting: Family

## 2022-10-19 NOTE — Telephone Encounter (Signed)
Larene [place],   I believe I left his FMLA paperwork on the desk in my office by the phone and or in the white file holder on the left side of my desk by the wall. Could you check? I think I almost finished it? Could you possibly see if it is at the point it could be finished and sent off?

## 2022-10-19 NOTE — Assessment & Plan Note (Signed)
Acute on chronic pain  Pain is reducing pt ability to do IADL's  Increased fall risk with opiod use and robaxin use, advised pt fall risk precautions.  Advised to use walker for transit for now Continue with physical therapy and f/u with neurosurgeon as scheduled.  Fmla form to be filled out and completed.

## 2022-10-23 ENCOUNTER — Encounter: Payer: Self-pay | Admitting: Family

## 2022-10-24 NOTE — Telephone Encounter (Signed)
Forms located, completed and faxed to [employer]. Called the patient and advised the form is ready for pick up.

## 2022-10-27 ENCOUNTER — Other Ambulatory Visit: Payer: Self-pay

## 2022-10-31 ENCOUNTER — Encounter: Payer: Self-pay | Admitting: Neurosurgery

## 2022-10-31 ENCOUNTER — Ambulatory Visit (INDEPENDENT_AMBULATORY_CARE_PROVIDER_SITE_OTHER): Payer: 59 | Admitting: Neurosurgery

## 2022-10-31 VITALS — BP 144/84 | Ht 70.5 in | Wt 360.8 lb

## 2022-10-31 DIAGNOSIS — M5416 Radiculopathy, lumbar region: Secondary | ICD-10-CM | POA: Diagnosis not present

## 2022-10-31 NOTE — Progress Notes (Signed)
Referring Physician:  No referring provider defined for this encounter.  Primary Physician:  Mort Sawyers, FNP  History of Present Illness: 10/31/2022 [name] is here today with a chief complaint of left leg pain.  His pain is in his anterior thigh.  He was previously admitted.  I have copied my consult note below.  He has been at home and is doing somewhat better.  He still having discomfort particularly later in the day.  He is able to walk with a walker.  He did physical therapy at home but stopped after 2 visits.  He is interested in another injection.   The symptoms are causing a significant impact on the patient's life.   10/04/2022 [name] is here today with a chief complaint of leg pain and difficulty walking.  He has had worsening issues over the past several weeks.  He is having some problems with numbness in his left leg.  He is able to urinate and control his bowel movements, but he is having some difficulty with standing due to pain and instability.  He came into the hospital today because he has had trouble ambulating.  He has been on multiple steroid tapers.   Bowel/Bladder Dysfunction: none   Conservative measures:  Physical therapy: none  Multimodal medical therapy including regular antiinflammatories: multiple steroid tapers  Injections: has had epidural steroid injections in the past   Past Surgery: none   The symptoms are causing a significant impact on the patient's life.    I have utilized the care everywhere function in epic to review the outside records available from external health systems.  Review of Systems:  A 10 point review of systems is negative, except for the pertinent positives and negatives detailed in the HPI.  Past Medical History: Past Medical History:  Diagnosis Date   Anxiety    Chicken pox    Chronic combined systolic (congestive) and diastolic (congestive) heart failure    a. 01/2018 Echo: EF 35-40%; b. 04/2018  Echo: EF 45-50%, diff HK, Gr2 DD; c. 02/2020 Echo: EF 45-50%, glob HK, Gr1 DD.   Coronary artery disease    a. 02/2012 NSTEMI/Cath/PCI: pLAD 60%, mLAD 80%, dLAD 70, nonobs LCX/RCA --> DES to prox/mid LAD; b. inf STEMI 01/23/18: ost/proxLAD 75% at the prox edge of the previously placed stent, mLAD 75%, OM1 50%, OM2 50%, pRCA 100% s/p PCI/DES, rPLA 70%; c. 02/2018 MV: Ef 44%, apical inferior infarct. No ischemia.   Dilated aortic root    a. 02/2020 Echo: Ao root 3.9cm.   Hyperlipidemia    Hypertension    Ischemic cardiomyopathy    a. echo 7/19: EF 35-40%, HK of the inf wall, Gr1DD; b. 04/2018 Echo: EF 45-50%, diff HK, Gr2 DD, mildly dil Ao root/Asc Al. Mild MR. Mild BAE. Nl RV fxn; c. 02/2020 Echo: EF 45-50%, glob HK, Gr1 DD. RVSP ~ 35.12mmHg. Ao root 3.9cm.   Obesity    OSA (obstructive sleep apnea) 03/27/2014   PAF (paroxysmal atrial fibrillation)    a. noted during [facility] 01/23/18 during inferior STEMI; b. resolved with IV amiodarone; c. not placed on anticoagulation given brief episode, acute illness, and need for DAPT   PVC's (premature ventricular contractions)    a. 02/2018 Holter: Frq PVC's. No afib. Beta blocker titrated.   VF (ventricular fibrillation)    a. noted during [facility] 01/23/2018 s/p defibrillation x 2    Past Surgical History: Past Surgical History:  Procedure Laterality Date   CARDIAC CATHETERIZATION  03/12/2012   CORONARY ANGIOPLASTY  03/12/2012   s/p drug eluting stent to the mid LAD : 3.5 x 38 mm Xience drug-eluting stent. Post dilated to 4.0 in mid segment and 4.5 proximally.   CORONARY/GRAFT ACUTE MI REVASCULARIZATION N/A 01/23/2018   Procedure: Coronary/Graft Acute MI Revascularization;  Surgeon: Marcina Millard, MD;  Location: [facility] INVASIVE CV LAB;  Service: Cardiovascular;  Laterality: N/A;   IR INJECT/THERA/INC NEEDLE/CATH/PLC EPI/LUMB/SAC W/IMG  10/06/2022   LEFT HEART CATH AND CORONARY ANGIOGRAPHY N/A 01/23/2018   Procedure: LEFT HEART CATH AND CORONARY ANGIOGRAPHY;  Surgeon:  Marcina Millard, MD;  Location: [facility] INVASIVE CV LAB;  Service: Cardiovascular;  Laterality: N/A;    Allergies: Allergies as of 10/31/2022 - Review Complete 10/31/2022  Allergen Reaction Noted   Flomax [tamsulosin] Nausea Only 07/21/2020   Atorvastatin Other (See Comments) 04/22/2013   Crestor [rosuvastatin calcium] Other (See Comments) 06/14/2018    Medications: Current Meds  Medication Sig   Accu-Chek Softclix Lancets lancets 1 each 3 (three) times daily.   acetaminophen (TYLENOL) 325 MG tablet Take 2 tablets (650 mg total) by mouth every 8 (eight) hours as needed for moderate pain, fever or headache.   aspirin 81 MG chewable tablet Chew 1 tablet (81 mg total) by mouth daily.   bisacodyl (DULCOLAX) 5 MG EC tablet Take 2 tablets (10 mg total) by mouth at bedtime. Skip the dose if no constipation   Blood Glucose Monitoring Suppl (ACCU-CHEK GUIDE) w/Device KIT 1 EACH BY DOES NOT APPLY ROUTE IN THE MORNING, AT NOON, AND AT BEDTIME.   carvedilol (COREG) 25 MG tablet Take 1 tablet (25 mg total) by mouth 2 (two) times daily with a meal.   clobetasol cream (TEMOVATE) 0.05 % Apply 1 Application topically 2 (two) times daily.   clopidogrel (PLAVIX) 75 MG tablet Take 1 tablet (75 mg total) by mouth daily.   gabapentin (NEURONTIN) 100 MG capsule Take 1 capsule (100 mg total) by mouth 3 (three) times daily. Skip the dose when no longer needed   Glucose Blood (BLOOD GLUCOSE TEST STRIPS) STRP 1 each by In Vitro route in the morning, at noon, and at bedtime. May substitute to any manufacturer covered by patient's insurance.   hydrALAZINE (APRESOLINE) 50 MG tablet Take 1 tablet (50 mg total) by mouth 3 (three) times daily as needed (Systolic BP greater than 150).   hydrOXYzine (ATARAX) 25 MG tablet Take 1 tablet (25 mg total) by mouth 3 (three) times daily as needed for anxiety.   ibuprofen (ADVIL) 600 MG tablet Take 1 tablet (600 mg total) by mouth every 8 (eight) hours as needed for moderate pain.    Lancet Device MISC 1 each by Does not apply route in the morning, at noon, and at bedtime. May substitute to any manufacturer covered by patient's insurance.   lidocaine (LIDODERM) 5 % Place 1 patch onto the skin every 12 (twelve) hours. Remove & Discard patch within 12 hours or as directed by MD   lisinopril (ZESTRIL) 40 MG tablet Take 1 tablet (40 mg total) by mouth daily.   methocarbamol (ROBAXIN) 500 MG tablet Take 1 tablet (500 mg total) by mouth every 8 (eight) hours as needed for muscle spasms. Skip the dose when no longer needed   Oxycodone HCl 10 MG TABS Take 1 tablet (10 mg total) by mouth every 6 (six) hours as needed for severe pain or moderate pain.   pantoprazole (PROTONIX) 40 MG tablet Take 1 tablet (40 mg total) by mouth 2 (two) times daily.  polyethylene glycol powder (GLYCOLAX/MIRALAX) 17 GM/SCOOP powder Mix 1 capful (17 g) in a beverage and drink once daily.   rosuvastatin (CRESTOR) 10 MG tablet TAKE 1 TABLET BY MOUTH EVERY DAY AT 6PM   Semaglutide,0.25 or 0.5MG /DOS, (OZEMPIC, 0.25 OR 0.5 MG/DOSE,) 2 MG/1.5ML SOPN Inject 0.25 mg into the skin once a week for 30 days, THEN 0.5 mg once a week.   spironolactone (ALDACTONE) 25 MG tablet Take 1 tablet (25 mg total) by mouth daily.    Social History: Social History   Tobacco Use   Smoking status: Never   Smokeless tobacco: Never   Tobacco comments:    Never used tobacco products  Vaping Use   Vaping Use: Never used  Substance Use Topics   Alcohol use: Yes    Alcohol/week: 1.0 standard drink of alcohol    Types: 1 Cans of beer per week   Drug use: Not Currently    Types: Marijuana    Family Medical History: Family History  Problem Relation Age of Onset   Heart disease Mother    Hyperlipidemia Mother    Hypertension Mother    Heart attack Mother 73       CABG X 4; past at age 45   Heart disease Sister 71       s/p stents    Stroke Father    Cancer Neg Hx    Diabetes Neg Hx     Physical Examination: Vitals:    10/31/22 0951  BP: (!) 144/84    General: Patient is well developed, well nourished, calm, collected, and in no apparent distress. Attention to examination is appropriate.  Neck:   Supple.  Full range of motion.  Respiratory: Patient is breathing without any difficulty.   NEUROLOGICAL:     Awake, alert, oriented to person, place, and time.  Speech is clear and fluent.   Cranial Nerves: Pupils equal round and reactive to light.  Facial tone is symmetric.  Facial sensation is symmetric.    Strength: Side Biceps Triceps Deltoid Interossei Grip Wrist Ext. Wrist Flex.  R 5 5 5 5 5 5 5   L 5 5 5 5 5 5 5    Side Iliopsoas Quads Hamstring PF DF EHL  R 5 5 5 5 5 5   L 5 5 5 5 5 5    Reflexes are 1+ and symmetric at the biceps, triceps, brachioradialis, patella and achilles.   Hoffman's is absent.   Bilateral upper and lower extremity sensation is intact to light touch.    No evidence of dysmetria noted.  Gait is abnormal-requires walker due to discomfort.     Medical Decision Making  Imaging: MRI L spine 10/04/2022 MRI L spine 10/04/22   IMPRESSION: 1. New large left central disc extrusion at L2-3 with 2.7 cm of caudal migration to the L3 inferior endplate resulting in severe spinal canal stenosis. Additionally, there is irregularity of the left lateral margin of the dura with adjacent intrathecal signal abnormality and possible herniation of epidural fat, raising the possibility of an intrathecal disc fragment. 2. Otherwise unchanged lumbar spondylosis, worst at L3-L4, where there is moderate spinal canal stenosis and moderate left neural foraminal narrowing.     Electronically Signed   By: Orvan Falconer M.D.   On: 10/04/2022 12:39  I have personally reviewed the images and agree with the above interpretation.  Assessment and Plan: Mr. Latka is a pleasant 62 y.o. male with lumbar radiculopathy that is improved but still substantially impairing his mobility.  I  recommended that he return to physical therapy.  I would like him to get an additional injection.  We have discussed that his weight goal is 285 pounds to consider intervention.  He started back on a strict diet and Ozempic and has started making progress with his weight.  I have encouraged him to keep focus on this as it will help his general health as well.  He is out of work.  I do not think he is ready to return to work.  It is possible he will be ready to return in 2 to 4 weeks.  I would like to see him back in 2 months.   Thank you for involving me in the care of this patient.      Maybell Misenheimer K. Myer HaffYarbrough MD, [facility] Neurosurgery

## 2022-11-02 ENCOUNTER — Telehealth: Payer: Self-pay

## 2022-11-02 NOTE — Telephone Encounter (Signed)
-----   Message from Mort Sawyers, [place] sent at 11/01/2022 11:18 AM EDT ----- Can we clarify with patients exact dates from start to end for FMLA ----- Message ----- From: Mort Sawyers, FNP Sent: 10/30/2022  12:00 AM EDT To: Mort Sawyers, FNP  Go into note and addend with appropriate date from [facility]

## 2022-11-02 NOTE — Telephone Encounter (Signed)
-----   Message from Tabitha Dugal, FNP sent at 11/01/2022 11:18 AM EDT ----- Can we clarify with patients exact dates from start to end for FMLA ----- Message ----- From: Dugal, Tabitha, FNP Sent: 10/30/2022  12:00 AM EDT To: Tabitha Dugal, FNP  Go into note and addend with appropriate date from FMLA   

## 2022-11-09 ENCOUNTER — Ambulatory Visit: Payer: 59 | Admitting: Neurosurgery

## 2022-11-10 ENCOUNTER — Other Ambulatory Visit: Payer: Self-pay

## 2022-11-10 ENCOUNTER — Other Ambulatory Visit: Payer: Self-pay | Admitting: Family

## 2022-11-10 ENCOUNTER — Telehealth: Payer: Self-pay | Admitting: Neurosurgery

## 2022-11-10 NOTE — Telephone Encounter (Signed)
He wants to return to work on 11/15/2022. He will drop off the form. What should his weight restriction be?

## 2022-11-10 NOTE — Telephone Encounter (Signed)
Refill for gabapentin (NEURONTIN) 100 MG capsule. I don't see where this medication has been sent.  LR- 10/10/22 ( 90 caps/ no refill) Gillis Santa, MD LV- 10/18/22 NV- Not Scheduled

## 2022-11-10 NOTE — Telephone Encounter (Signed)
Patient has been notified. He is going to drop off a form to be signed and we can fax everything together.

## 2022-11-10 NOTE — Telephone Encounter (Signed)
Does he have a lifting restriction?

## 2022-11-10 NOTE — Telephone Encounter (Signed)
Patients work note has been completed and is in his chart. Please let him know.

## 2022-11-12 ENCOUNTER — Other Ambulatory Visit: Payer: Self-pay

## 2022-11-13 ENCOUNTER — Encounter: Payer: Self-pay | Admitting: Family

## 2022-11-13 MED ORDER — HYDROXYZINE HCL 25 MG PO TABS: 25.0000 mg | ORAL_TABLET | Freq: Three times a day (TID) | ORAL | 1 refills | Status: DC | PRN

## 2022-11-13 NOTE — Telephone Encounter (Signed)
Refill for hydrOXYzine (ATARAX) 25 MG tablet   LR- 10/10/22 ( 30 tabs/no refills left) LV- 10/18/22 NV- Not scheduled

## 2022-11-18 ENCOUNTER — Other Ambulatory Visit: Payer: Self-pay | Admitting: Family

## 2022-11-18 DIAGNOSIS — E1165 Type 2 diabetes mellitus with hyperglycemia: Secondary | ICD-10-CM

## 2022-11-20 ENCOUNTER — Telehealth: Payer: Self-pay

## 2022-11-20 NOTE — Telephone Encounter (Signed)
-----   Message from Mort Sawyers, [place] sent at 11/20/2022  8:09 AM EDT ----- Regarding: ozempic Can we see how pt is doing on ozempic is he tolerating well? If he is still on 0.25 mg weekly does he want to increase to 0.5 mg ?

## 2022-11-20 NOTE — Telephone Encounter (Addendum)
Refill: Semaglutide,0.25 or 0.5MG /DOS, (OZEMPIC, 0.25 OR 0.5 MG/DOSE,) 2 MG/1.5ML SOPN   LV- 10/13/22 LR- 10/13/22 ( 2.27ml/ no refills) NV- Not scheduled  Pt is tolerating well and would like the 0.5mg  dose sent to pharmacy.

## 2022-11-20 NOTE — Telephone Encounter (Signed)
Pt is tolerating well. See refill note.

## 2022-11-29 ENCOUNTER — Ambulatory Visit
Payer: 59 | Attending: Student in an Organized Health Care Education/Training Program | Admitting: Student in an Organized Health Care Education/Training Program

## 2022-11-29 ENCOUNTER — Encounter: Payer: Self-pay | Admitting: Student in an Organized Health Care Education/Training Program

## 2022-11-29 VITALS — BP 129/77 | HR 102 | Temp 97.3°F | Resp 17 | Ht 70.0 in | Wt 360.0 lb

## 2022-11-29 DIAGNOSIS — M5116 Intervertebral disc disorders with radiculopathy, lumbar region: Secondary | ICD-10-CM | POA: Diagnosis not present

## 2022-11-29 DIAGNOSIS — M48062 Spinal stenosis, lumbar region with neurogenic claudication: Secondary | ICD-10-CM | POA: Insufficient documentation

## 2022-11-29 DIAGNOSIS — G8929 Other chronic pain: Secondary | ICD-10-CM | POA: Diagnosis present

## 2022-11-29 DIAGNOSIS — M5416 Radiculopathy, lumbar region: Secondary | ICD-10-CM | POA: Insufficient documentation

## 2022-11-29 NOTE — Progress Notes (Signed)
Safety precautions to be maintained throughout the outpatient stay will include: orient to surroundings, keep bed in low position, maintain call bell within reach at all times, provide assistance with transfer out of bed and ambulation.  

## 2022-11-29 NOTE — Progress Notes (Signed)
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 family/caregiver Care coordination (not separately reported)  Note by: Edward Jolly, MD (TTS technology used. I apologize for any typographical errors that were not detected and corrected.) Date: 11/29/2022; Time: 2:00 PM

## 2022-11-29 NOTE — Patient Instructions (Signed)
____________________________________________________________________________________________  General Risks and Possible Complications  Patient Responsibilities: It is important that you read this as it is part of your informed consent. It is our duty to inform you of the risks and possible complications associated with treatments offered to you. It is your responsibility as a patient to read this and to ask questions about anything that is not clear or that you believe was not covered in this document.  Patient's Rights: You have the right to refuse treatment. You also have the right to change your mind, even after initially having agreed to have the treatment done. However, under this last option, if you wait until the last second to change your mind, you may be charged for the materials used up to that point.  Introduction: Medicine is not an exact science. Everything in Medicine, including the lack of treatment(s), carries the potential for danger, harm, or loss (which is by definition: Risk). In Medicine, a complication is a secondary problem, condition, or disease that can aggravate an already existing one. All treatments carry the risk of possible complications. The fact that a side effects or complications occurs, does not imply that the treatment was conducted incorrectly. It must be clearly understood that these can happen even when everything is done following the highest safety standards.  No treatment: You can choose not to proceed with the proposed treatment alternative. The "PRO(s)" would include: avoiding the risk of complications associated with the therapy. The "CON(s)" would include: not getting any of the treatment benefits. These benefits fall under one of three categories: diagnostic; therapeutic; and/or palliative. Diagnostic benefits include: getting information which can ultimately lead to improvement of the disease or symptom(s). Therapeutic benefits are those associated with  the successful treatment of the disease. Finally, palliative benefits are those related to the decrease of the primary symptoms, without necessarily curing the condition (example: decreasing the pain from a flare-up of a chronic condition, such as incurable terminal cancer).  General Risks and Complications: These are associated to most interventional treatments. They can occur alone, or in combination. They fall under one of the following six (6) categories: no benefit or worsening of symptoms; bleeding; infection; nerve damage; allergic reactions; and/or death. No benefits or worsening of symptoms: In Medicine there are no guarantees, only probabilities. No healthcare provider can ever guarantee that a medical treatment will work, they can only state the probability that it may. Furthermore, there is always the possibility that the condition may worsen, either directly, or indirectly, as a consequence of the treatment. Bleeding: This is more common if the patient is taking a blood thinner, either prescription or over the counter (example: Goody Powders, Fish oil, Aspirin, Garlic, etc.), or if suffering a condition associated with impaired coagulation (example: Hemophilia, cirrhosis of the liver, low platelet counts, etc.). However, even if you do not have one on these, it can still happen. If you have any of these conditions, or take one of these drugs, make sure to notify your treating physician. Infection: This is more common in patients with a compromised immune system, either due to disease (example: diabetes, cancer, human immunodeficiency virus [HIV], etc.), or due to medications or treatments (example: therapies used to treat cancer and rheumatological diseases). However, even if you do not have one on these, it can still happen. If you have any of these conditions, or take one of these drugs, make sure to notify your treating physician. Nerve Damage: This is more common when the treatment is an    invasive one, but it can also happen with the use of medications, such as those used in the treatment of cancer. The damage can occur to small secondary nerves, or to large primary ones, such as those in the spinal cord and brain. This damage may be temporary or permanent and it may lead to impairments that can range from temporary numbness to permanent paralysis and/or brain death. Allergic Reactions: Any time a substance or material comes in contact with our body, there is the possibility of an allergic reaction. These can range from a mild skin rash (contact dermatitis) to a severe systemic reaction (anaphylactic reaction), which can result in death. Death: In general, any medical intervention can result in death, most of the time due to an unforeseen complication. ____________________________________________________________________________________________    ______________________________________________________________________  Preparing for your procedure  Appointments: If you think you may not be able to keep your appointment, call 24-48 hours in advance to cancel. We need time to make it available to others.  During your procedure appointment there will be: No Prescription Refills. No disability issues to discussed. No medication changes or discussions.  Instructions: Food intake: Avoid eating anything solid for at least 8 hours prior to your procedure. Clear liquid intake: You may take clear liquids such as water up to 2 hours prior to your procedure. (No carbonated drinks. No soda.) Transportation: Unless otherwise stated by your physician, bring a driver. Morning Medicines: Except for blood thinners, take all of your other morning medications with a sip of water. Make sure to take your heart and blood pressure medicines. If your blood pressure's lower number is above 100, the case will be rescheduled. Blood thinners: Make sure to stop your blood thinners as instructed.  If you take  a blood thinner, but were not instructed to stop it, call our office [phone] and ask to talk to a nurse. Not stopping a blood thinner prior to certain procedures could lead to serious complications. Diabetics on insulin: Notify the staff so that you can be scheduled 1st case in the morning. If your diabetes requires high dose insulin, take only  of your normal insulin dose the morning of the procedure and notify the staff that you have done so. Preventing infections: Shower with an antibacterial soap the morning of your procedure.  Build-up your immune system: Take 1000 mg of Vitamin C with every meal (3 times a day) the day prior to your procedure. Antibiotics: Inform the nursing staff if you are taking any antibiotics or if you have any conditions that may require antibiotics prior to procedures. (Example: recent joint implants)   Pregnancy: If you are pregnant make sure to notify the nursing staff. Not doing so may result in injury to the fetus, including death.  Sickness: If you have a cold, fever, or any active infections, call and cancel or reschedule your procedure. Receiving steroids while having an infection may result in complications. Arrival: You must be in the facility at least 30 minutes prior to your scheduled procedure. Tardiness: Your scheduled time is also the cutoff time. If you do not arrive at least 15 minutes prior to your procedure, you will be rescheduled.  Children: Do not bring any children with you. Make arrangements to keep them home. Dress appropriately: There is always a possibility that your clothing may get soiled. Avoid long dresses. Valuables: Do not bring any jewelry or valuables.  Reasons to call and reschedule or cancel your procedure: (Following these recommendations will minimize the risk   of a serious complication.) Surgeries: Avoid having procedures within 2 weeks of any surgery. (Avoid for 2 weeks before or after any surgery). Flu Shots: Avoid having  procedures within 2 weeks of a flu shots or . (Avoid for 2 weeks before or after immunizations). Barium: Avoid having a procedure within 7-10 days after having had a radiological study involving the use of radiological contrast. (Myelograms, Barium swallow or enema study). Heart attacks: Avoid any elective procedures or surgeries for the initial 6 months after a "Myocardial Infarction" (Heart Attack). Blood thinners: It is imperative that you stop these medications before procedures. Let us know if you if you take any blood thinner.  Infection: Avoid procedures during or within two weeks of an infection (including chest colds or gastrointestinal problems). Symptoms associated with infections include: Localized redness, fever, chills, night sweats or profuse sweating, burning sensation when voiding, cough, congestion, stuffiness, runny nose, sore throat, diarrhea, nausea, vomiting, cold or Flu symptoms, recent or current infections. It is specially important if the infection is over the area that we intend to treat. Heart and lung problems: Symptoms that may suggest an active cardiopulmonary problem include: cough, chest pain, breathing difficulties or shortness of breath, dizziness, ankle swelling, uncontrolled high or unusually low blood pressure, and/or palpitations. If you are experiencing any of these symptoms, cancel your procedure and contact your primary care physician for an evaluation.  Remember:  Regular Business hours are:  Monday to Thursday 8:00 AM to 4:00 PM  Provider's Schedule: Francisco Naveira, MD:  Procedure days: Tuesday and Thursday 7:30 AM to 4:00 PM  Bilal Lateef, MD:  Procedure days: Monday and Wednesday 7:30 AM to 4:00 PM  ______________________________________________________________________   Epidural Steroid Injection Patient Information  Description: The epidural space surrounds the nerves as they exit the spinal cord.  In some patients, the nerves can be  compressed and inflamed by a bulging disc or a tight spinal canal (spinal stenosis).  By injecting steroids into the epidural space, we can bring irritated nerves into direct contact with a potentially helpful medication.  These steroids act directly on the irritated nerves and can reduce swelling and inflammation which often leads to decreased pain.  Epidural steroids may be injected anywhere along the spine and from the neck to the low back depending upon the location of your pain.   After numbing the skin with local anesthetic (like Novocaine), a small needle is passed into the epidural space slowly.  You may experience a sensation of pressure while this is being done.  The entire block usually last less than 10 minutes.  Conditions which may be treated by epidural steroids:  Low back and leg pain Neck and arm pain Spinal stenosis Post-laminectomy syndrome Herpes zoster (shingles) pain Pain from compression fractures  Preparation for the injection:  Do not eat any solid food or dairy products within 8 hours of your appointment.  You may drink clear liquids up to 3 hours before appointment.  Clear liquids include water, black coffee, juice or soda.  No milk or cream please. You may take your regular medication, including pain medications, with a sip of water before your appointment  Diabetics should hold regular insulin (if taken separately) and take 1/2 normal NPH dos the morning of the procedure.  Carry some sugar containing items with you to your appointment. A driver must accompany you and be prepared to drive you home after your procedure.  Bring all your current medications with your. An IV may be inserted and   sedation may be given at the discretion of the physician.   A blood pressure cuff, EKG and other monitors will often be applied during the procedure.  Some patients may need to have extra oxygen administered for a short period. You will be asked to provide medical information,  including your allergies, prior to the procedure.  We must know immediately if you are taking blood thinners (like Coumadin/Warfarin)  Or if you are allergic to IV iodine contrast (dye). We must know if you could possible be pregnant.  Possible side-effects: Bleeding from needle site Infection (rare, may require surgery) Nerve injury (rare) Numbness & tingling (temporary) Difficulty urinating (rare, temporary) Spinal headache ( a headache worse with upright posture) Light -headedness (temporary) Pain at injection site (several days) Decreased blood pressure (temporary) Weakness in arm/leg (temporary) Pressure sensation in back/neck (temporary)  Call if you experience: Fever/chills associated with headache or increased back/neck pain. Headache worsened by an upright position. New onset weakness or numbness of an extremity below the injection site Hives or difficulty breathing (go to the emergency room) Inflammation or drainage at the infection site Severe back/neck pain Any new symptoms which are concerning to you  Please note:  Although the local anesthetic injected can often make your back or neck feel good for several hours after the injection, the pain will likely return.  It takes 3-7 days for steroids to work in the epidural space.  You may not notice any pain relief for at least that one week.  If effective, we will often do a series of three injections spaced 3-6 weeks apart to maximally decrease your pain.  After the initial series, we generally will wait several months before considering a repeat injection of the same type.  If you have any questions, please call [phone] [facility] 

## 2022-12-04 ENCOUNTER — Telehealth: Payer: Self-pay | Admitting: Cardiovascular Disease

## 2022-12-04 NOTE — Telephone Encounter (Signed)
I have reviewed notes from Dr. Kirke Corin and looks to be he has cleared the pt. I will re-fax notes over to requesting.

## 2022-12-04 NOTE — Telephone Encounter (Signed)
Rodman Pickle is calling from [facility] to follow up on the patient Pre-Op Clearance. Please advise.

## 2022-12-06 ENCOUNTER — Ambulatory Visit: Payer: 59 | Admitting: Student in an Organized Health Care Education/Training Program

## 2022-12-13 ENCOUNTER — Ambulatory Visit
Payer: 59 | Attending: Student in an Organized Health Care Education/Training Program | Admitting: Student in an Organized Health Care Education/Training Program

## 2022-12-13 ENCOUNTER — Encounter: Payer: Self-pay | Admitting: Student in an Organized Health Care Education/Training Program

## 2022-12-13 ENCOUNTER — Ambulatory Visit
Admission: RE | Admit: 2022-12-13 | Discharge: 2022-12-13 | Disposition: A | Discharge status: Home / Self Care | Payer: 59 | Source: Ambulatory Visit | Attending: Student in an Organized Health Care Education/Training Program | Admitting: Student in an Organized Health Care Education/Training Program

## 2022-12-13 DIAGNOSIS — M5116 Intervertebral disc disorders with radiculopathy, lumbar region: Secondary | ICD-10-CM | POA: Insufficient documentation

## 2022-12-13 DIAGNOSIS — M48062 Spinal stenosis, lumbar region with neurogenic claudication: Secondary | ICD-10-CM | POA: Diagnosis present

## 2022-12-13 DIAGNOSIS — G8929 Other chronic pain: Secondary | ICD-10-CM | POA: Diagnosis present

## 2022-12-13 DIAGNOSIS — M5416 Radiculopathy, lumbar region: Secondary | ICD-10-CM | POA: Diagnosis not present

## 2022-12-13 MED ORDER — LIDOCAINE HCL 2 % IJ SOLN
INTRAMUSCULAR | Status: AC
  Filled 2022-12-13: qty 20

## 2022-12-13 MED ORDER — SODIUM CHLORIDE 0.9% FLUSH
2.0000 mL | Freq: Once | INTRAVENOUS | Status: DC
  Administered 2022-12-13: 2 mL

## 2022-12-13 MED ORDER — DEXAMETHASONE SODIUM PHOSPHATE 10 MG/ML IJ SOLN
10.0000 mg | Freq: Once | INTRAMUSCULAR | Status: DC
  Administered 2022-12-13: 10 mg

## 2022-12-13 MED ORDER — ROPIVACAINE HCL 2 MG/ML IJ SOLN
INTRAMUSCULAR | Status: AC
  Filled 2022-12-13: qty 20

## 2022-12-13 MED ORDER — DEXAMETHASONE SODIUM PHOSPHATE 10 MG/ML IJ SOLN
INTRAMUSCULAR | Status: AC
  Filled 2022-12-13: qty 1

## 2022-12-13 MED ORDER — LIDOCAINE HCL 2 % IJ SOLN
20.0000 mL | Freq: Once | INTRAMUSCULAR | Status: DC
  Administered 2022-12-13: 400 mg

## 2022-12-13 MED ORDER — SODIUM CHLORIDE (PF) 0.9 % IJ SOLN
INTRAMUSCULAR | Status: AC
  Filled 2022-12-13: qty 10

## 2022-12-13 MED ORDER — IOHEXOL 180 MG/ML  SOLN
INTRAMUSCULAR | Status: AC
  Filled 2022-12-13: qty 20

## 2022-12-13 MED ORDER — ROPIVACAINE HCL 2 MG/ML IJ SOLN
2.0000 mL | Freq: Once | INTRAMUSCULAR | Status: DC
  Administered 2022-12-13: 2 mL via EPIDURAL

## 2022-12-13 MED ORDER — IOHEXOL 180 MG/ML  SOLN
10.0000 mL | Freq: Once | INTRAMUSCULAR | Status: DC
  Administered 2022-12-13: 10 mL via EPIDURAL

## 2022-12-13 NOTE — Patient Instructions (Signed)

## 2022-12-13 NOTE — Progress Notes (Signed)
Safety precautions to be maintained throughout the outpatient stay will include: orient to surroundings, keep bed in low position, maintain call bell within reach at all times, provide assistance with transfer out of bed and ambulation.  

## 2022-12-13 NOTE — Progress Notes (Signed)
PROVIDER NOTE: Interpretation of information contained herein should be left to medically-trained personnel. Specific patient instructions are provided elsewhere under "Patient Instructions" section of medical record. This document was created in part using STT-dictation technology, any transcriptional errors that may result from this process are unintentional.  Patient: Steve Burnett Type: Established DOB: [id number] MRN: [id number] PCP: Mort Sawyers, FNP  Service: Procedure DOS: 12/13/2022 Setting: Ambulatory Location: Ambulatory outpatient facility Delivery: Face-to-face Provider: Edward Jolly, MD Specialty: Interventional Pain Management Specialty designation: 09 Location: Outpatient facility Ref. Prov.: Edward Jolly, MD       Interventional Therapy   Procedure: Lumbar epidural steroid injection (LESI) (interlaminar) #1    Laterality: Left   Level:  L2-3 Level.  Imaging: Fluoroscopic guidance         Anesthesia: Local anesthesia (1-2% Lidocaine) DOS: 12/13/2022  Performed by: Edward Jolly, MD  Purpose: Diagnostic/Therapeutic Indications: Lumbar radicular pain of intraspinal etiology of more than 4 weeks that has failed to respond to conservative therapy and is severe enough to impact quality of life or function. 1. Chronic radicular lumbar pain   2. Lumbar disc herniation with radiculopathy (left)   3. 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  Prep solution: DuraPrep (Iodine Povacrylex [0.7% available iodine] and Isopropyl Alcohol, 74% w/w) Prep Area: Entire Posterior Lumbar Region from lower scapular tip down to mid buttocks area and from flank to flank. Materials:  Tray: Epidural tray Needle(s):  Type:  Epidural needle (Tuohy) Gauge (G):  17 Length: Regular (3.5-in) Qty: 1   Pre-op H&P Assessment:  Steve Burnett is a 62 y.o. (year old), male patient, seen today for interventional treatment. He  has a past surgical history that includes Cardiac catheterization (03/12/2012); Coronary angioplasty (03/12/2012); Coronary/Graft Acute MI Revascularization (N/A, 01/23/2018); LEFT HEART CATH AND CORONARY ANGIOGRAPHY (N/A, 01/23/2018); and IR INJECT DIAG/THERA/INC NEEDLE/CATH/PLC EPI/LUMB/SAC W/IMG (10/06/2022). Steve Burnett has a current medication list which includes the following prescription(s): accu-chek softclix lancets, bisacodyl, accu-chek guide, carvedilol, clobetasol cream, clopidogrel, hydroxyzine, lidocaine, lisinopril, rosuvastatin, ozempic (0.25 or 0.5 mg/dose), spironolactone, hydralazine, and pantoprazole, and the following Facility-Administered Medications: dexamethasone, iohexol, lidocaine, ropivacaine (pf) 2 mg/ml (0.2%), and sodium chloride flush. His primarily concern today is the Back Pain  Initial Vital Signs:  Pulse/HCG Rate: 94  Temp: 97.8 F (36.6 C) Resp: 20 BP: (!) 164/114 SpO2: 97 %  BMI: Estimated body mass index is 51.65 kg/m as calculated from the following:   Height as of this encounter: 5\' 10"  (1.778 m).   Weight as of this encounter: 360 lb (163.3 kg).  Risk Assessment: Allergies: Reviewed. He is allergic to flomax [tamsulosin], atorvastatin, and crestor [rosuvastatin calcium].  Allergy Precautions: None required Coagulopathies: Reviewed. None identified.  Blood-thinner therapy: None at this time Active Infection(s): Reviewed. None identified. [name] is afebrile  Site Confirmation: Steve Burnett was asked to confirm the procedure and laterality before marking the site Procedure checklist: Completed Consent: Before the procedure and under the influence of no sedative(s), amnesic(s), or anxiolytics, the patient was informed of the treatment options, risks and possible  complications. To fulfill our ethical and legal obligations, as recommended by the American Medical Association's Code of Ethics, I have informed the patient of my clinical impression; the nature and  purpose of the treatment or procedure; the risks, benefits, and possible complications of the intervention; the alternatives, including doing nothing; the risk(s) and benefit(s) of the alternative treatment(s) or procedure(s); and the risk(s) and benefit(s) of doing nothing. The patient was provided information about the general risks and possible complications associated with the procedure. These may include, but are not limited to: failure to achieve desired goals, infection, bleeding, organ or nerve damage, allergic reactions, paralysis, and death. In addition, the patient was informed of those risks and complications associated to Spine-related procedures, such as failure to decrease pain; infection (i.e.: Meningitis, epidural or intraspinal abscess); bleeding (i.e.: epidural hematoma, subarachnoid hemorrhage, or any other type of intraspinal or peri-dural bleeding); organ or nerve damage (i.e.: Any type of peripheral nerve, nerve root, or spinal cord injury) with subsequent damage to sensory, motor, and/or autonomic systems, resulting in permanent pain, numbness, and/or weakness of one or several areas of the body; allergic reactions; (i.e.: anaphylactic reaction); and/or death. Furthermore, the patient was informed of those risks and complications associated with the medications. These include, but are not limited to: allergic reactions (i.e.: anaphylactic or anaphylactoid reaction(s)); adrenal axis suppression; blood sugar elevation that in diabetics may result in ketoacidosis or comma; water retention that in patients with history of congestive heart failure may result in shortness of breath, pulmonary edema, and decompensation with resultant heart failure; weight gain; swelling or edema; medication-induced  neural toxicity; particulate matter embolism and blood vessel occlusion with resultant organ, and/or nervous system infarction; and/or aseptic necrosis of one or more joints. Finally, the patient was informed that Medicine is not an exact science; therefore, there is also the possibility of unforeseen or unpredictable risks and/or possible complications that may result in a catastrophic outcome. The patient indicated having understood very clearly. We have given the patient no guarantees and we have made no promises. Enough time was given to the patient to ask questions, all of which were answered to the patient's satisfaction. Mr. Sweeten has indicated that he wanted to continue with the procedure. Attestation: I, the ordering provider, attest that I have discussed with the patient the benefits, risks, side-effects, alternatives, likelihood of achieving goals, and potential problems during recovery for the procedure that I have provided informed consent. Date  Time: 12/13/2022  1:14 PM   Pre-Procedure Preparation:  Monitoring: As per clinic protocol. Respiration, ETCO2, SpO2, BP, heart rate and rhythm monitor placed and checked for adequate function Safety Precautions: Patient was assessed for positional comfort and pressure points before starting the procedure. Time-out: I initiated and conducted the "Time-out" before starting the procedure, as per protocol. The patient was asked to participate by confirming the accuracy of the "Time Out" information. Verification of the correct person, site, and procedure were performed and confirmed by me, the nursing staff, and the patient. "Time-out" conducted as per Joint Commission's Universal Protocol (UP.01.01.01). Time: 1346 Start Time: 1346 hrs.  Description/Narrative of Procedure:          Target: Epidural space via interlaminar opening, initially targeting the lower laminar border of the superior vertebral body. Region: Lumbar Approach: Percutaneous  paravertebral  Rationale (medical necessity): procedure needed and proper for the diagnosis and/or treatment of the patient's medical symptoms and needs. Procedural Technique Safety Precautions: Aspiration looking for blood return was conducted prior to all injections. At no point did we inject any substances, as a needle was being advanced. No attempts were made at seeking any paresthesias. Safe injection practices and needle disposal techniques used. Medications  properly checked for expiration dates. SDV (single dose vial) medications used. Description of the Procedure: Protocol guidelines were followed. The procedure needle was introduced through the skin, ipsilateral to the reported pain, and advanced to the target area. Bone was contacted and the needle walked caudad, until the lamina was cleared. The epidural space was identified using "loss-of-resistance technique" with 2-3 ml of PF-NaCl (0.9% NSS), in a 5cc LOR glass syringe.  Vitals:   12/13/22 1342 12/13/22 1347 12/13/22 1352 12/13/22 1357  BP: (!) 158/113 (!) 162/98 (!) 158/105 (!) 151/99  Pulse: 95 96 96   Resp: 19 18 20    Temp:      TempSrc:      SpO2: 97% 97% 98%   Weight:      Height:        Start Time: 1346 hrs. End Time: 1350 hrs.  Imaging Guidance (Spinal):          Type of Imaging Technique: Fluoroscopy Guidance (Spinal) Indication(s): Assistance in needle guidance and placement for procedures requiring needle placement in or near specific anatomical locations not easily accessible without such assistance. Exposure Time: Please see nurses notes. Contrast: Before injecting any contrast, we confirmed that the patient did not have an allergy to iodine, shellfish, or radiological contrast. Once satisfactory needle placement was completed at the desired level, radiological contrast was injected. Contrast injected under live fluoroscopy. No contrast complications. See chart for type and volume of contrast used. Fluoroscopic  Guidance: I was personally present during the use of fluoroscopy. "Tunnel Vision Technique" used to obtain the best possible view of the target area. Parallax error corrected before commencing the procedure. "Direction-depth-direction" technique used to introduce the needle under continuous pulsed fluoroscopy. Once target was reached, antero-posterior, oblique, and lateral fluoroscopic projection used confirm needle placement in all planes. Images permanently stored in EMR. Interpretation: I personally interpreted the imaging intraoperatively. Adequate needle placement confirmed in multiple planes. Appropriate spread of contrast into desired area was observed. No evidence of afferent or efferent intravascular uptake. No intrathecal or subarachnoid spread observed. Permanent images saved into the patient's record.  Antibiotic Prophylaxis:   Anti-infectives (From admission, onward)    None      Indication(s): None identified  Post-operative Assessment:  Post-procedure Vital Signs:  Pulse/HCG Rate: 96  Temp: 97.8 F (36.6 C) Resp: 20 BP: (!) 151/99 SpO2: 98 %  EBL: None  Complications: No immediate post-treatment complications observed by team, or reported by patient.  Note: The patient tolerated the entire procedure well. A repeat set of vitals were taken after the procedure and the patient was kept under observation following institutional policy, for this type of procedure. Post-procedural neurological assessment was performed, showing return to baseline, prior to discharge. The patient was provided with post-procedure discharge instructions, including a section on how to identify potential problems. Should any problems arise concerning this procedure, the patient was given instructions to immediately contact us, at any time, without hesitation. In any case, we plan to contact the patient by telephone for a follow-up status report regarding this interventional procedure.  Comments:  No  additional relevant information.  Plan of Care (POC)  Orders:  Orders Placed This Encounter  Procedures   DG PAIN CLINIC C-ARM 1-60 MIN NO REPORT    Intraoperative interpretation by procedural physician at [facility].    Standing Status:   Standing    Number of Occurrences:   1    Order Specific Question:   Reason for exam:  Answer:   Assistance in needle guidance and placement for procedures requiring needle placement in or near specific anatomical locations not easily accessible without such assistance.   Ok to restart plavix tomorrow  Medications ordered for procedure: Meds ordered this encounter  Medications   iohexol (OMNIPAQUE) 180 MG/ML injection 10 mL    Must be Myelogram-compatible. If not available, you may substitute with a water-soluble, non-ionic, hypoallergenic, myelogram-compatible radiological contrast medium.   lidocaine (XYLOCAINE) 2 % (with pres) injection 400 mg   sodium chloride flush (NS) 0.9 % injection 2 mL   ropivacaine (PF) 2 mg/mL (0.2%) (NAROPIN) injection 2 mL   dexamethasone (DECADRON) injection 10 mg   Medications administered: Zadie Cleverly "[name]" had no medications administered during this visit.  See the medical record for exact dosing, route, and time of administration.  Follow-up plan:   Return in about 15 days (around 12/28/2022) for Post Procedure Evaluation, in person.      Recent Visits Date Type Provider Dept  11/29/22 Office Visit Edward Jolly, MD [facility]  Showing recent visits within past 90 days and meeting all other requirements Today's Visits Date Type Provider Dept  12/13/22 Procedure visit Edward Jolly, MD [facility]  Showing today's visits and meeting all other requirements Future Appointments No visits were found meeting these conditions. Showing future appointments within next 90 days and meeting all other requirements  Disposition: Discharge home  Discharge (Date  Time):  12/13/2022;   hrs.   Primary Care Physician: Mort Sawyers, FNP Location: [facility] Note by: Edward Jolly, MD (TTS technology used. I apologize for any typographical errors that were not detected and corrected.) Date: 12/13/2022; Time: 2:01 PM  Disclaimer:  Medicine is not an Visual merchandiser. The only guarantee in medicine is that nothing is guaranteed. It is important to note that the decision to proceed with this intervention was based on the information collected from the patient. The Data and conclusions were drawn from the patient's questionnaire, the interview, and the physical examination. Because the information was provided in large part by the patient, it cannot be guaranteed that it has not been purposely or unconsciously manipulated. Every effort has been made to obtain as much relevant data as possible for this evaluation. It is important to note that the conclusions that lead to this procedure are derived in large part from the available data. Always take into account that the treatment will also be dependent on availability of resources and existing treatment guidelines, considered by other Pain Management Practitioners as being common knowledge and practice, at the time of the intervention. For Medico-Legal purposes, it is also important to point out that variation in procedural techniques and pharmacological choices are the acceptable norm. The indications, contraindications, technique, and results of the above procedure should only be interpreted and judged by a Board-Certified Interventional Pain Specialist with extensive familiarity and expertise in the same exact procedure and technique.

## 2022-12-14 ENCOUNTER — Telehealth: Payer: Self-pay

## 2022-12-14 NOTE — Telephone Encounter (Signed)
Post procedure follow up.  Patient states he is doing well 

## 2023-01-02 ENCOUNTER — Ambulatory Visit (INDEPENDENT_AMBULATORY_CARE_PROVIDER_SITE_OTHER): Payer: 59 | Admitting: Neurosurgery

## 2023-01-02 ENCOUNTER — Encounter: Payer: Self-pay | Admitting: Neurosurgery

## 2023-01-02 VITALS — BP 134/90 | Ht 70.0 in | Wt 367.8 lb

## 2023-01-02 DIAGNOSIS — M5416 Radiculopathy, lumbar region: Secondary | ICD-10-CM

## 2023-01-02 NOTE — Progress Notes (Signed)
Referring Physician:  Mort Sawyers, FNP [address] [place],  [place] [address]  Primary Physician:  Mort Sawyers, FNP  History of Present Illness: 01/02/2023 Steve Burnett continues to improve.  He has been able to be more active.  He still has some discomfort and tiredness prickly towards the end of the day and the end of the week.  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  10/31/2022 Steve Burnett is here today with a chief complaint of left leg pain.  His pain is in his anterior thigh.  He was previously admitted.  I have copied my consult note below.  He has been at home and is doing somewhat better.  He still having discomfort particularly later in the day.  He is able to walk with a walker.  He did physical therapy at home but stopped after 2 visits.  He is interested in another injection.   The symptoms are causing a significant impact on the patient's life.   10/04/2022 Steve Burnett is here today with a chief complaint of leg pain and difficulty walking.  He has had worsening issues over the past several weeks.  He is having some problems with numbness in his left leg.  He is able to urinate and control his bowel movements, but he is having some difficulty with standing due to pain and instability.  He came into the hospital today because he has had trouble ambulating.  He has been on multiple steroid tapers.   Bowel/Bladder Dysfunction: none   Conservative measures:  Physical therapy: none  Multimodal medical therapy including regular antiinflammatories: multiple steroid tapers  Injections: has had epidural steroid injections in the past   Past Surgery: none   The symptoms are causing a significant impact on the patient's life.    I have utilized the care everywhere function in epic to review the outside records available from external health systems.  Review of Systems:  A 10 point review of systems is negative, except for the pertinent  positives and negatives detailed in the HPI.  Past Medical History: Past Medical History:  Diagnosis Date   Anxiety    Chicken pox    Chronic combined systolic (congestive) and diastolic (congestive) heart failure (HCC)    a. 01/2018 Echo: EF 35-40%; b. 04/2018 Echo: EF 45-50%, diff HK, Gr2 DD; c. 02/2020 Echo: EF 45-50%, glob HK, Gr1 DD.   Coronary artery disease    a. 02/2012 NSTEMI/Cath/PCI: pLAD 60%, mLAD 80%, dLAD 70, nonobs LCX/RCA --> DES to prox/mid LAD; b. inf STEMI 01/23/18: ost/proxLAD 75% at the prox edge of the previously placed stent, mLAD 75%, OM1 50%, OM2 50%, pRCA 100% s/p PCI/DES, rPLA 70%; c. 02/2018 MV: Ef 44%, apical inferior infarct. No ischemia.   Dilated aortic root (HCC)    a. 02/2020 Echo: Ao root 3.9cm.   Hyperlipidemia    Hypertension    Ischemic cardiomyopathy    a. echo 7/19: EF 35-40%, HK of the inf wall, Gr1DD; b. 04/2018 Echo: EF 45-50%, diff HK, Gr2 DD, mildly dil Ao root/Asc Al. Mild MR. Mild BAE. Nl RV fxn; c. 02/2020 Echo: EF 45-50%, glob HK, Gr1 DD. RVSP ~ 35.32mmHg. Ao root 3.9cm.   Obesity    OSA (obstructive sleep apnea) 03/27/2014   PAF (paroxysmal atrial fibrillation) (HCC)    a. noted during [facility] 01/23/18 during inferior STEMI; b. resolved with IV amiodarone; c. not placed on anticoagulation given brief episode, acute  illness, and need for DAPT   PVC's (premature ventricular contractions)    a. 02/2018 Holter: Frq PVC's. No afib. Beta blocker titrated.   VF (ventricular fibrillation) (HCC)    a. noted during [facility] 01/23/2018 s/p defibrillation x 2    Past Surgical History: Past Surgical History:  Procedure Laterality Date   CARDIAC CATHETERIZATION  03/12/2012   CORONARY ANGIOPLASTY  03/12/2012   s/p drug eluting stent to the mid LAD : 3.5 x 38 mm Xience drug-eluting stent. Post dilated to 4.0 in mid segment and 4.5 proximally.   CORONARY/GRAFT ACUTE MI REVASCULARIZATION N/A 01/23/2018   Procedure: Coronary/Graft Acute MI Revascularization;  Surgeon: Marcina Millard, MD;  Location: [facility] INVASIVE CV LAB;  Service: Cardiovascular;  Laterality: N/A;   IR INJECT/THERA/INC NEEDLE/CATH/PLC EPI/LUMB/SAC W/IMG  10/06/2022   LEFT HEART CATH AND CORONARY ANGIOGRAPHY N/A 01/23/2018   Procedure: LEFT HEART CATH AND CORONARY ANGIOGRAPHY;  Surgeon: Marcina Millard, MD;  Location: [facility] INVASIVE CV LAB;  Service: Cardiovascular;  Laterality: N/A;    Allergies: Allergies as of 01/02/2023 - Review Complete 01/02/2023  Allergen Reaction Noted   Flomax [tamsulosin] Nausea Only 07/21/2020   Atorvastatin Other (See Comments) 04/22/2013   Crestor [rosuvastatin calcium] Other (See Comments) 06/14/2018    Medications: No outpatient medications have been marked as taking for the 01/02/23 encounter (Office Visit) with Venetia Night, MD.    Social History: Social History   Tobacco Use   Smoking status: Never   Smokeless tobacco: Never   Tobacco comments:    Never used tobacco products  Vaping Use   Vaping Use: Never used  Substance Use Topics   Alcohol use: Yes    Alcohol/week: 1.0 standard drink of alcohol    Types: 1 Cans of beer per week   Drug use: Not Currently    Types: Marijuana    Family Medical History: Family History  Problem Relation Age of Onset   Heart disease Mother    Hyperlipidemia Mother    Hypertension Mother    Heart attack Mother 55       CABG X 4; past at age 30   Heart disease Sister 71       s/p stents    Stroke Father    Cancer Neg Hx    Diabetes Neg Hx     Physical Examination: Vitals:   01/02/23 0934  BP: (!) 134/90    General: Patient is well developed, well nourished, calm, collected, and in no apparent distress. Attention to examination is appropriate.  Neck:   Supple.  Full range of motion.  Respiratory: Patient is breathing without any difficulty.   NEUROLOGICAL:     Awake, alert, oriented to person, place, and time.  Speech is clear and fluent.   Cranial Nerves: Pupils equal round and  reactive to light.  Facial tone is symmetric.  Facial sensation is symmetric.    Strength: Side Biceps Triceps Deltoid Interossei Grip Wrist Ext. Wrist Flex.  R 5 5 5 5 5 5 5   L 5 5 5 5 5 5 5    Side Iliopsoas Quads Hamstring PF DF EHL  R 5 5 5 5 5 5   L 5 5 5 5 5 5    Reflexes are 1+ and symmetric at the biceps, triceps, brachioradialis, patella and achilles.   Hoffman's is absent.   Bilateral upper and lower extremity sensation is intact to light touch.    No evidence of dysmetria noted.  Gait is improved-he is now walking without assistance.  Medical Decision Making  Imaging: MRI L spine 10/04/2022 MRI L spine 10/04/22   IMPRESSION: 1. New large left central disc extrusion at L2-3 with 2.7 cm of caudal migration to the L3 inferior endplate resulting in severe spinal canal stenosis. Additionally, there is irregularity of the left lateral margin of the dura with adjacent intrathecal signal abnormality and possible herniation of epidural fat, raising the possibility of an intrathecal disc fragment. 2. Otherwise unchanged lumbar spondylosis, worst at L3-L4, where there is moderate spinal canal stenosis and moderate left neural foraminal narrowing.     Electronically Signed   By: Orvan Falconer M.D.   On: 10/04/2022 12:39  I have personally reviewed the images and agree with the above interpretation.  Assessment and Plan: Steve Burnett is a pleasant 62 y.o. male with lumbar radiculopathy that is improved.  He still has some residual symptoms but he is greatly improved compared to when I initially saw him.  This point, I would like him to continue conservative management with exercises and consider whether an additional injection may help him.  He is discussing this with Dr. Lourdes Sledge later in the week.  I will see him back on an as-needed basis as long as his symptoms continue to improve.  I continue to encourage slow increase in physical activity with a goal towards weight  loss, which will be very helpful for his general health.    We have discussed that his weight goal is 285 pounds to consider intervention.    Thank you for involving me in the care of this patient.      Ardelle Haliburton K. Myer Haff MD, [facility]

## 2023-01-03 ENCOUNTER — Ambulatory Visit: Payer: 59 | Admitting: Student in an Organized Health Care Education/Training Program

## 2023-01-04 ENCOUNTER — Ambulatory Visit: Payer: 59 | Admitting: Student in an Organized Health Care Education/Training Program

## 2023-01-08 ENCOUNTER — Ambulatory Visit: Payer: 59 | Admitting: Student in an Organized Health Care Education/Training Program

## 2023-02-21 ENCOUNTER — Encounter: Payer: Self-pay | Admitting: Orthopedic Surgery

## 2023-02-21 ENCOUNTER — Ambulatory Visit (INDEPENDENT_AMBULATORY_CARE_PROVIDER_SITE_OTHER): Payer: 59 | Admitting: Orthopedic Surgery

## 2023-02-21 VITALS — BP 138/94 | Ht 70.0 in | Wt 369.0 lb

## 2023-02-21 DIAGNOSIS — M5126 Other intervertebral disc displacement, lumbar region: Secondary | ICD-10-CM | POA: Diagnosis not present

## 2023-02-21 MED ORDER — METHYLPREDNISOLONE 4 MG PO TBPK: ORAL_TABLET | ORAL | 0 refills | Status: DC

## 2023-02-21 NOTE — Progress Notes (Signed)
Referring Physician:  No referring provider defined for this encounter.  Primary Physician:  Mort Sawyers, FNP  History of Present Illness: 02/21/2023 [name] has a history of CAD, HTN, STEMI, ischemic cardiomyopathy, OSA, DM, BPH, hyperlipidemia, and morbid obesity.   Last seen by Dr. Myer Haff on 01/02/23 for lumbar radiculopathy that was improving. He has known left sided L2-L3 disc herniation.   Weight loss discussed and recommended as well with goal weight of 285 lbs. He was to follow up with Dr. Cherylann Ratel to see if another injection was recommended (did not do this).  He has follow up with Dr. Cherylann Ratel on 03/06/23.   He is here for follow up.   He was on riding  mower/tractor for 6+ hours over the weekend. He sat up in the bed on Monday and felt a pop in his back. He didn't have any pain (felt like when you crack your knuckles). He did okay once he was up and moving, but he was worried that back would give way. Today when he got out of the car he felt a tightness in the lower back almost like there was a catch.  He has intermittent pain in his back and back feels like it wants to give way/catch. He notes intermittent tingling in left thigh that he's had for months. He notes some weakness in left leg- he feels like it is back to where it was when he was discharged from the hospital. The leg feels heavy.   Bowel/Bladder Dysfunction: none  His last HgbA1c was 7.9 on 10/04/22.   Conservative measures:  Physical therapy: previous HHPT stopped at 4th of July, not discharged PT was causing soreness for several days.   Multimodal medical therapy including regular antiinflammatories: multiple steroid tapers  Injections:  Left L2-L3 IL ESI 12/13/22 Left L2-L3 IL ESI 10/06/22  Past Surgery: No  The symptoms are causing a significant impact on the patient's life.   Review of Systems:  A 10 point review of systems is negative, except for the pertinent positives and negatives  detailed in the HPI.  Past Medical History: Past Medical History:  Diagnosis Date   Anxiety    Chicken pox    Chronic combined systolic (congestive) and diastolic (congestive) heart failure (HCC)    a. 01/2018 Echo: EF 35-40%; b. 04/2018 Echo: EF 45-50%, diff HK, Gr2 DD; c. 02/2020 Echo: EF 45-50%, glob HK, Gr1 DD.   Coronary artery disease    a. 02/2012 NSTEMI/Cath/PCI: pLAD 60%, mLAD 80%, dLAD 70, nonobs LCX/RCA --> DES to prox/mid LAD; b. inf STEMI 01/23/18: ost/proxLAD 75% at the prox edge of the previously placed stent, mLAD 75%, OM1 50%, OM2 50%, pRCA 100% s/p PCI/DES, rPLA 70%; c. 02/2018 MV: Ef 44%, apical inferior infarct. No ischemia.   Dilated aortic root (HCC)    a. 02/2020 Echo: Ao root 3.9cm.   Hyperlipidemia    Hypertension    Ischemic cardiomyopathy    a. echo 7/19: EF 35-40%, HK of the inf wall, Gr1DD; b. 04/2018 Echo: EF 45-50%, diff HK, Gr2 DD, mildly dil Ao root/Asc Al. Mild MR. Mild BAE. Nl RV fxn; c. 02/2020 Echo: EF 45-50%, glob HK, Gr1 DD. RVSP ~ 35.12mmHg. Ao root 3.9cm.   Obesity    OSA (obstructive sleep apnea) 03/27/2014   PAF (paroxysmal atrial fibrillation) (HCC)    a. noted during [facility] 01/23/18 during inferior STEMI; b. resolved with IV amiodarone; c. not placed on anticoagulation given brief episode, acute illness, and need for  DAPT   PVC's (premature ventricular contractions)    a. 02/2018 Holter: Frq PVC's. No afib. Beta blocker titrated.   VF (ventricular fibrillation) (HCC)    a. noted during [facility] 01/23/2018 s/p defibrillation x 2    Past Surgical History: Past Surgical History:  Procedure Laterality Date   CARDIAC CATHETERIZATION  03/12/2012   CORONARY ANGIOPLASTY  03/12/2012   s/p drug eluting stent to the mid LAD : 3.5 x 38 mm Xience drug-eluting stent. Post dilated to 4.0 in mid segment and 4.5 proximally.   CORONARY/GRAFT ACUTE MI REVASCULARIZATION N/A 01/23/2018   Procedure: Coronary/Graft Acute MI Revascularization;  Surgeon: Marcina Millard, MD;  Location:  [facility] INVASIVE CV LAB;  Service: Cardiovascular;  Laterality: N/A;   IR INJECT/THERA/INC NEEDLE/CATH/PLC EPI/LUMB/SAC W/IMG  10/06/2022   LEFT HEART CATH AND CORONARY ANGIOGRAPHY N/A 01/23/2018   Procedure: LEFT HEART CATH AND CORONARY ANGIOGRAPHY;  Surgeon: Marcina Millard, MD;  Location: [facility] INVASIVE CV LAB;  Service: Cardiovascular;  Laterality: N/A;    Allergies: Allergies as of 02/21/2023 - Review Complete 02/21/2023  Allergen Reaction Noted   Flomax [tamsulosin] Nausea Only 07/21/2020   Atorvastatin Other (See Comments) 04/22/2013   Crestor [rosuvastatin calcium] Other (See Comments) 06/14/2018    Medications: Outpatient Encounter Medications as of 02/21/2023  Medication Sig   Accu-Chek Softclix Lancets lancets 1 each 3 (three) times daily.   Blood Glucose Monitoring Suppl (ACCU-CHEK GUIDE) w/Device KIT 1 EACH BY DOES NOT APPLY ROUTE IN THE MORNING, AT NOON, AND AT BEDTIME.   carvedilol (COREG) 25 MG tablet Take 1 tablet (25 mg total) by mouth 2 (two) times daily with a meal.   clobetasol cream (TEMOVATE) 0.05 % Apply 1 Application topically 2 (two) times daily.   clopidogrel (PLAVIX) 75 MG tablet Take 1 tablet (75 mg total) by mouth daily.   hydrOXYzine (ATARAX) 25 MG tablet Take 1 tablet (25 mg total) by mouth 3 (three) times daily as needed for anxiety.   lidocaine (LIDODERM) 5 % Place 1 patch onto the skin every 12 (twelve) hours. Remove & Discard patch within 12 hours or as directed by MD   lisinopril (ZESTRIL) 40 MG tablet Take 1 tablet (40 mg total) by mouth daily.   methylPREDNISolone (MEDROL DOSEPAK) 4 MG TBPK tablet Use as directed x 6 days.   rosuvastatin (CRESTOR) 10 MG tablet TAKE 1 TABLET BY MOUTH EVERY DAY AT 6PM   spironolactone (ALDACTONE) 25 MG tablet Take 1 tablet (25 mg total) by mouth daily.   [DISCONTINUED] bisacodyl (DULCOLAX) 5 MG EC tablet Take 2 tablets (10 mg total) by mouth at bedtime. Skip the dose if no constipation   [DISCONTINUED] hydrALAZINE  (APRESOLINE) 50 MG tablet Take 1 tablet (50 mg total) by mouth 3 (three) times daily as needed (Systolic BP greater than 150). (Patient not taking: Reported on 11/29/2022)   [DISCONTINUED] pantoprazole (PROTONIX) 40 MG tablet Take 1 tablet (40 mg total) by mouth 2 (two) times daily.   [DISCONTINUED] Semaglutide,0.25 or 0.5MG /DOS, (OZEMPIC, 0.25 OR 0.5 MG/DOSE,) 2 MG/3ML SOPN Inject 0.5 mg into the skin once a week.   No facility-administered encounter medications on file as of 02/21/2023.    Social History: Social History   Tobacco Use   Smoking status: Never   Smokeless tobacco: Never   Tobacco comments:    Never used tobacco products  Vaping Use   Vaping status: Never Used  Substance Use Topics   Alcohol use: Yes    Alcohol/week: 1.0 standard drink of alcohol  Types: 1 Cans of beer per week   Drug use: Not Currently    Types: Marijuana    Family Medical History: Family History  Problem Relation Age of Onset   Heart disease Mother    Hyperlipidemia Mother    Hypertension Mother    Heart attack Mother 35       CABG X 4; past at age 7   Heart disease Sister 70       s/p stents    Stroke Father    Cancer Neg Hx    Diabetes Neg Hx     Physical Examination: Vitals:   02/21/23 1327 02/21/23 1420  BP: (!) 142/94 (!) 138/94    Awake, alert, oriented to person, place, and time.  Speech is clear and fluent. Fund of knowledge is appropriate.   Cranial Nerves: Pupils equal round and reactive to light.  Facial tone is symmetric.    No posterior lumbar tenderness.   No abnormal lesions on exposed skin.   Strength: Side Biceps Triceps Deltoid Interossei Grip Wrist Ext. Wrist Flex.  R 5 5 5 5 5 5 5   L 5 5 5 5 5 5 5    Side Iliopsoas Quads Hamstring PF DF EHL  R 5 5 5 5 5 5   L 5 5 5 5 5 5    Reflexes are 1+ and symmetric at the biceps, brachioradialis, patella and achilles.   Hoffman's is absent.  Clonus is not present.   Bilateral upper and lower extremity sensation is  intact to light touch, but diminished left lateral thigh compared to right.   He ambulates with a cane.   Medical Decision Making  Imaging: Nothing recent.   Assessment and Plan: Mr. Achille is a pleasant 62 y.o. male hwas on riding  mower/tractor for 6+ hours over the weekend. He sat up in the bed on Monday and felt a pop in his back. No pain.   Since Monday, he has intermittent pain in his back and back feels like it wants to give way/catch. He notes intermittent tingling in left thigh that he's had for months. He notes some weakness in left leg- he feels like it is back to where it was when he was discharged from the hospital. The leg feels heavy.   He has known left sided L2-L3 disc herniation. Flare up of pain likely from over doing it on riding lawn mower/tractor.   Treatment options discussed with patient and following plan made:   - Medrol dose pack for symptom relief. Has taken in past with good improvement. Discussed this can elevate his blood sugars. He will keep and eye on these and call PCP with concerns.  - Keep appointment with Dr. Cherylann Ratel on 03/06/23.  - Consider going back to PT once pain is better.  - Will message him in 2 weeks to check on him and schedule follow up.   BP was elevated. No symptoms of chest pain, shortness of breath, blurry vision, or headaches. He checks BP at home and it generally runs lower. Will recheck at home and call PCP if not improved. If he develops CP, SOB, blurry vision, or headaches, then he will go to ED.     I spent a total of 20 minutes in face-to-face and non-face-to-face activities related to this patient's care today including review of outside records, review of imaging, review of symptoms, physical exam, discussion of differential diagnosis, discussion of treatment options, and documentation.   Drake Leach PA-C Dept. of Neurosurgery

## 2023-02-21 NOTE — Patient Instructions (Signed)
It was so nice to see you today. Thank you so much for coming in.    I sent a prescription for a steroid pack to help with pain and inflammation. Take as directed. This can elevate your blood sugars so be sure to check them. If you have any concerns, then call your PCP.   Keep your appointment with Dr. Cherylann Ratel.   Your blood pressure was elevated today. I want you to recheck it at home and follow up with your PCP if it remains high. If you have any chest pain, shortness of breath, blurry vision, or headaches then you need to go to ED.    I will message you in 2 weeks to check on you, but please do not hesitate to call if you have any questions or concerns. You can also message me in MyChart.   Drake Leach PA-C [phone]

## 2023-03-06 ENCOUNTER — Encounter: Payer: Self-pay | Admitting: Student in an Organized Health Care Education/Training Program

## 2023-03-06 ENCOUNTER — Ambulatory Visit
Payer: 59 | Attending: Student in an Organized Health Care Education/Training Program | Admitting: Student in an Organized Health Care Education/Training Program

## 2023-03-06 VITALS — BP 142/97 | HR 67 | Temp 97.2°F | Ht 70.0 in | Wt 365.0 lb

## 2023-03-06 DIAGNOSIS — M5116 Intervertebral disc disorders with radiculopathy, lumbar region: Secondary | ICD-10-CM | POA: Diagnosis not present

## 2023-03-06 DIAGNOSIS — M5416 Radiculopathy, lumbar region: Secondary | ICD-10-CM | POA: Insufficient documentation

## 2023-03-06 DIAGNOSIS — M48062 Spinal stenosis, lumbar region with neurogenic claudication: Secondary | ICD-10-CM | POA: Diagnosis not present

## 2023-03-06 DIAGNOSIS — G8929 Other chronic pain: Secondary | ICD-10-CM | POA: Insufficient documentation

## 2023-03-06 NOTE — Progress Notes (Signed)
PROVIDER NOTE: Information contained herein reflects review and annotations entered in association with encounter. Interpretation of such information and data should be left to medically-trained personnel. Information provided to patient can be located elsewhere in the medical record under "Patient Instructions". Document created using STT-dictation technology, any transcriptional errors that may result from process are unintentional.    Patient: Steve Burnett  Service Category: E/M  Provider: Edward Jolly, MD  DOB: 1960-11-25  DOS: 03/06/2023  Referring Provider: Mort Sawyers, FNP  MRN: [id number]  Specialty: Interventional Pain Management  PCP: Mort Sawyers, FNP  Type: Established Patient  Setting: Ambulatory outpatient    Location: Office  Delivery: Face-to-face     HPI  [name], a 62 y.o. year old male, is here today because of his Chronic radicular lumbar pain [M54.16, G89.29]. [name] primary complain today is Back Pain (Lower with left side worse)  Pertinent problems: [name] does not have any pertinent problems on file. Pain Assessment: Severity of Chronic pain is reported as a 1 /10. Location: Back Lower/denies. Onset: More than a month ago. Quality: Aching, Constant. Timing: Constant. Modifying factor(s): bending over, heat. Vitals:  height is 5\' 10"  (1.778 m) and weight is 365 lb (165.6 kg) (abnormal). His temporal temperature is 97.2 F (36.2 C) (abnormal). His blood pressure is 142/97 (abnormal) and his pulse is 67. His oxygen saturation is 97%.  BMI: Estimated body mass index is 52.37 kg/m as calculated from the following:   Height as of this encounter: 5\' 10"  (1.778 m).   Weight as of this encounter: 365 lb (165.6 kg). Last encounter: 11/29/2022. Last procedure: 12/13/2022.  Reason for encounter: post-procedure evaluation and assessment. As well as increased left low back and leg pain.    Post-procedure evaluation   Procedure: Lumbar epidural steroid  injection (LESI) (interlaminar) #1    Laterality: Left   Level:  L2-3 Level.  Imaging: Fluoroscopic guidance         Anesthesia: Local anesthesia (1-2% Lidocaine) DOS: 12/13/2022  Performed by: Edward Jolly, MD  Purpose: Diagnostic/Therapeutic Indications: Lumbar radicular pain of intraspinal etiology of more than 4 weeks that has failed to respond to conservative therapy and is severe enough to impact quality of life or function. 1. Chronic radicular lumbar pain   2. Lumbar disc herniation with radiculopathy (left)   3. 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)    NAS-11 Pain score:   Pre-procedure: 2 /10   Post-procedure: 2 /10      Effectiveness:  Initial hour after procedure: 100 %  Subsequent 4-6 hours post-procedure: 75 % Analgesia past initial 6 hours: 75 %  Ongoing improvement:  Analgesic: 75% Function: Somewhat improved ROM: Somewhat improved  Patient states that he was doing very well after his previous lumbar epidural steroid injection done 12/13/2022 until July 31 when he was riding on his lawnmower for an extended period of time over the weekend and when he woke up Monday he felt a pop in his lower back and noticed weakness and instability of his legs.  He had tingling in his left thigh as well as weakness of his left leg.  He saw neurosurgery at that time.  He was prescribed a Medrol Dosepak.  He was instructed to follow-up with me.  We discussed repeating a left L2-L3 epidural steroid injection.  ROS  Constitutional: Denies any fever or chills Gastrointestinal: No reported hemesis, hematochezia, vomiting, or acute GI distress Musculoskeletal: Denies any acute onset joint swelling, redness, loss of ROM, or  weakness Neurological: No reported episodes of acute onset apraxia, aphasia, dysarthria, agnosia, amnesia, paralysis, loss of coordination, or loss of consciousness  Medication Review  Accu-Chek Guide, Accu-Chek Softclix Lancets, carvedilol,  clobetasol cream, clopidogrel, hydrOXYzine, lidocaine, lisinopril, methylPREDNISolone, rosuvastatin, and spironolactone  History Review  Allergy: [name] is allergic to flomax [tamsulosin], atorvastatin, and crestor [rosuvastatin calcium]. Drug: [name]  reports that he does not currently use drugs after having used the following drugs: Marijuana. Alcohol:  reports current alcohol use of about 1.0 standard drink of alcohol per week. Tobacco:  reports that he has never smoked. He has never used smokeless tobacco. Social: Steve Burnett  reports that he has never smoked. He has never used smokeless tobacco. He reports current alcohol use of about 1.0 standard drink of alcohol per week. He reports that he does not currently use drugs after having used the following drugs: Marijuana. Medical:  has a past medical history of Anxiety, Chicken pox, Chronic combined systolic (congestive) and diastolic (congestive) heart failure (HCC), Coronary artery disease, Dilated aortic root (HCC), Hyperlipidemia, Hypertension, Ischemic cardiomyopathy, Obesity, OSA (obstructive sleep apnea) (03/27/2014), PAF (paroxysmal atrial fibrillation) (HCC), PVC's (premature ventricular contractions), and VF (ventricular fibrillation) (HCC). Surgical: [name]  has a past surgical history that includes Cardiac catheterization (03/12/2012); Coronary angioplasty (03/12/2012); Coronary/Graft Acute MI Revascularization (N/A, 01/23/2018); LEFT HEART CATH AND CORONARY ANGIOGRAPHY (N/A, 01/23/2018); and IR INJECT DIAG/THERA/INC NEEDLE/CATH/PLC EPI/LUMB/SAC W/IMG (10/06/2022). Family: family history includes Heart attack (age of onset: 31) in his mother; Heart disease in his mother; Heart disease (age of onset: 79) in his sister; Hyperlipidemia in his mother; Hypertension in his mother; Stroke in his father.  Laboratory Chemistry Profile   Renal Lab Results  Component Value Date   BUN 33 (H) 10/10/2022   CREATININE 0.73 10/10/2022   BCR  17 01/29/2020   GFR 99.68 04/08/2020   GFRAA 111 01/29/2020   GFRNONAA >60 10/10/2022    Hepatic Lab Results  Component Value Date   AST 23 10/04/2022   ALT 30 10/04/2022   ALBUMIN 3.6 10/04/2022   ALKPHOS 65 10/04/2022   LIPASE 142 04/30/2012    Electrolytes Lab Results  Component Value Date   NA 132 (L) 10/10/2022   K 5.7 (H) 10/10/2022   CL 102 10/10/2022   CALCIUM 8.3 (L) 10/10/2022   MG 2.6 (H) 10/08/2022   PHOS 3.3 10/08/2022    Bone No results found for: "VD25OH", "VD125OH2TOT", "ZO1096EA5", "WU9811BJ4", "25OHVITD1", "25OHVITD2", "25OHVITD3", "TESTOFREE", "TESTOSTERONE"  Inflammation (CRP: Acute Phase) (ESR: Chronic Phase) No results found for: "CRP", "ESRSEDRATE", "LATICACIDVEN"       Note: Above Lab results reviewed.   MR LUMBAR SPINE WO CONTRAST   Narrative CLINICAL DATA:  Low back pain, cauda equina syndrome suspected.   EXAM: MRI LUMBAR SPINE WITHOUT CONTRAST   TECHNIQUE: Multiplanar, multisequence MR imaging of the lumbar spine was performed. No intravenous contrast was administered.   COMPARISON:  MRI lumbar spine 04/23/2015.   FINDINGS: Segmentation: Conventional numbering is assumed with 5 non-rib-bearing, lumbar type vertebral bodies.   Alignment:  Normal.   Vertebrae: Modic type 2 degenerative endplate marrow signal changes at L3-4 and L5-S1.   Conus medullaris and cauda equina: Conus extends to the L1 level. Conus and cauda equina appear normal.   Paraspinal and other soft tissues: Unremarkable.   Disc levels:   T12-L1:  Normal.   L1-L2: Disc bulge and epidural lipomatosis results in mild narrowing of the thecal sac. No neural foraminal narrowing. Mild bilateral facet arthropathy.  L2-L3: New large left central disc extrusion with 2.7 cm of caudal migration to the L3 inferior endplate resulting in severe spinal canal stenosis. On axial images 21 and 22 of series 8 and 9, there is irregularity of the left lateral margin of the  dura with adjacent intrathecal signal abnormality and possible herniation of epidural fat, raising the possibility of an intrathecal disc fragment.   L3-L4: Disc bulge and facet arthropathy contribute to moderate spinal canal stenosis, moderate left and mild right neural foraminal narrowing, unchanged.   L4-L5: Disc bulge and facet arthropathy results in mild spinal canal stenosis with mild bilateral neural foraminal narrowing, unchanged.   L5-S1: Left eccentric disc bulge displaces the traversing left S1 nerve root in the lateral recess, unchanged. Moderate disc height loss and facet arthropathy contribute to moderate left and mild right neural foraminal narrowing, unchanged.   IMPRESSION: 1. New large left central disc extrusion at L2-3 with 2.7 cm of caudal migration to the L3 inferior endplate resulting in severe spinal canal stenosis. Additionally, there is irregularity of the left lateral margin of the dura with adjacent intrathecal signal abnormality and possible herniation of epidural fat, raising the possibility of an intrathecal disc fragment. 2. Otherwise unchanged lumbar spondylosis, worst at L3-L4, where there is moderate spinal canal stenosis and moderate left neural foraminal narrowing.    Physical Exam  General appearance: Well nourished, well developed, and well hydrated. In no apparent acute distress Mental status: Alert, oriented x 3 (person, place, & time)       Respiratory: No evidence of acute respiratory distress Eyes: PERLA Vitals: BP (!) 142/97   Pulse 67   Temp (!) 97.2 F (36.2 C) (Temporal)   Ht 5\' 10"  (1.778 m)   Wt (!) 365 lb (165.6 kg)   SpO2 97%   BMI 52.37 kg/m  BMI: Estimated body mass index is 52.37 kg/m as calculated from the following:   Height as of this encounter: 5\' 10"  (1.778 m).   Weight as of this encounter: 365 lb (165.6 kg). Ideal: Ideal body weight: 73 kg (160 lb 15 oz) Adjusted ideal body weight: 110 kg (242 lb 9  oz)  Lumbar Spine Area Exam  Skin & Axial Inspection: No masses, redness, or swelling Alignment: Symmetrical Functional ROM: Pain restricted ROM affecting primarily the left Stability: No instability detected Muscle Tone/Strength: Functionally intact. No obvious neuro-muscular anomalies detected. Sensory (Neurological): Dermatomal pain pattern Palpation: No palpable anomalies       Provocative Tests:   Lumbar quadrant test (Kemp's test): (+) on the left for foraminal stenosis Lateral bending test: (+) ipsilateral radicular pain, on the left. Positive for left-sided foraminal stenosis.   Gait & Posture Assessment  Ambulation: Patient ambulates using a cane Gait: Antalgic gait (limping) Posture: WNL  Lower Extremity Exam      Side: Right lower extremity   Side: Left lower extremity  Stability: No instability observed           Stability: No instability observed          Skin & Extremity Inspection: Skin color, temperature, and hair growth are WNL. No peripheral edema or cyanosis. No masses, redness, swelling, asymmetry, or associated skin lesions. No contractures.   Skin & Extremity Inspection: Skin color, temperature, and hair growth are WNL. No peripheral edema or cyanosis. No masses, redness, swelling, asymmetry, or associated skin lesions. No contractures.  Functional ROM: Unrestricted ROM  Functional ROM: Unrestricted ROM                  Muscle Tone/Strength: Functionally intact. No obvious neuro-muscular anomalies detected.   Muscle Tone/Strength: Functionally intact. No obvious neuro-muscular anomalies detected.  Sensory (Neurological): Unimpaired         Sensory (Neurological): Unimpaired        DTR: Patellar: deferred today Achilles: deferred today Plantar: deferred today   DTR: Patellar: deferred today Achilles: deferred today Plantar: deferred today  Palpation: No palpable anomalies   Palpation: No palpable anomalies     Assessment   Diagnosis Status   1. Chronic radicular lumbar pain   2. Lumbar disc herniation with radiculopathy (left)   3. 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    Answer:   [facility]   Follow-up plan:   Return in about 8 days (around 03/14/2023) for left l2/3 ESI, in clinic [facility] (stop Plavix 7 days prior).      Recent Visits Date Type Provider Dept  12/13/22 Procedure visit Edward Jolly, MD [facility]  Showing recent visits within past 90 days and meeting all other requirements Today's Visits Date Type Provider Dept  03/06/23 Office Visit Edward Jolly, MD [facility]  Showing today's visits and meeting all other requirements Future Appointments No visits were found meeting these conditions. Showing future appointments within next 90 days and meeting all other requirements  I discussed the assessment and treatment plan with the patient. The patient was provided an opportunity to ask questions and all were answered. The patient agreed with the plan and demonstrated an understanding of the instructions.  Patient advised to call back or seek an in-person evaluation if the symptoms or condition worsens.  Duration of encounter: 30 minutes.  Total time on encounter, as per AMA guidelines included both the face-to-face and non-face-to-face time personally spent by the physician and/or other qualified health care professional(s) on the day of the encounter (includes time in  activities that require the physician or other qualified health care professional and does not include time in activities normally performed by clinical staff). Physician's time may include the following activities when performed: Preparing to see the patient (e.g., pre-charting review of records, searching for previously ordered imaging, lab work, and nerve conduction tests) Review of prior analgesic pharmacotherapies. Reviewing PMP Interpreting ordered tests (e.g., lab work, imaging, nerve conduction tests) Performing post-procedure evaluations, including interpretation of diagnostic procedures Obtaining and/or reviewing separately obtained history Performing a medically appropriate examination and/or evaluation Counseling and educating the patient/family/caregiver Ordering medications, tests, or procedures Referring and communicating with other health care professionals (when not separately reported) Documenting clinical information in the electronic or other health record Independently interpreting results (not separately reported) and communicating results to the patient/ family/caregiver Care coordination (not separately reported)  Note  by: Edward Jolly, MD Date: 03/06/2023; Time: 10:02 AM

## 2023-03-06 NOTE — Patient Instructions (Signed)
Stop Plavix 7 days before procedure.  GENERAL RISKS AND COMPLICATIONS  What are the risk, side effects and possible complications? Generally speaking, most procedures are safe.  However, with any procedure there are risks, side effects, and the possibility of complications.  The risks and complications are dependent upon the sites that are lesioned, or the type of nerve block to be performed.  The closer the procedure is to the spine, the more serious the risks are.  Great care is taken when placing the radio frequency needles, block needles or lesioning probes, but sometimes complications can occur. Infection: Any time there is an injection through the skin, there is a risk of infection.  This is why sterile conditions are used for these blocks.  There are four possible types of infection. Localized skin infection. Central Nervous System Infection-This can be in the form of Meningitis, which can be deadly. Epidural Infections-This can be in the form of an epidural abscess, which can cause pressure inside of the spine, causing compression of the spinal cord with subsequent paralysis. This would require an emergency surgery to decompress, and there are no guarantees that the patient would recover from the paralysis. Discitis-This is an infection of the intervertebral discs.  It occurs in about 1% of discography procedures.  It is difficult to treat and it may lead to surgery.        2. Pain: the needles have to go through skin and soft tissues, will cause soreness.       3. Damage to internal structures:  The nerves to be lesioned may be near blood vessels or    other nerves which can be potentially damaged.       4. Bleeding: Bleeding is more common if the patient is taking blood thinners such as  aspirin, Coumadin, Ticiid, Plavix, etc., or if he/she have some genetic predisposition  such as hemophilia. Bleeding into the spinal canal can cause compression of the spinal  cord with subsequent paralysis.   This would require an emergency surgery to  decompress and there are no guarantees that the patient would recover from the  paralysis.       5. Pneumothorax:  Puncturing of a lung is a possibility, every time a needle is introduced in  the area of the chest or upper back.  Pneumothorax refers to free air around the  collapsed lung(s), inside of the thoracic cavity (chest cavity).  Another two possible  complications related to a similar event would include: Hemothorax and Chylothorax.   These are variations of the Pneumothorax, where instead of air around the collapsed  lung(s), you may have blood or chyle, respectively.       6. Spinal headaches: They may occur with any procedures in the area of the spine.       7. Persistent CSF (Cerebro-Spinal Fluid) leakage: This is a rare problem, but may occur  with prolonged intrathecal or epidural catheters either due to the formation of a fistulous  track or a dural tear.       8. Nerve damage: By working so close to the spinal cord, there is always a possibility of  nerve damage, which could be as serious as a permanent spinal cord injury with  paralysis.       9. Death:  Although rare, severe deadly allergic reactions known as "Anaphylactic  reaction" can occur to any of the medications used.      10. Worsening of the symptoms:  We can always make  thing worse.  What are the chances of something like this happening? Chances of any of this occuring are extremely low.  By statistics, you have more of a chance of getting killed in a motor vehicle accident: while driving to the hospital than any of the above occurring .  Nevertheless, you should be aware that they are possibilities.  In general, it is similar to taking a shower.  Everybody knows that you can slip, hit your head and get killed.  Does that mean that you should not shower again?  Nevertheless always keep in mind that statistics do not mean anything if you happen to be on the wrong side of them.  Even if  a procedure has a 1 (one) in a 1,000,000 (million) chance of going wrong, it you happen to be that one..Also, keep in mind that by statistics, you have more of a chance of having something go wrong when taking medications.  Who should not have this procedure? If you are on a blood thinning medication (e.g. Coumadin, Plavix, see list of "Blood Thinners"), or if you have an active infection going on, you should not have the procedure.  If you are taking any blood thinners, please inform your physician.  How should I prepare for this procedure? Do not eat or drink anything at least six hours prior to the procedure. Bring a driver with you .  It cannot be a taxi. Come accompanied by an adult that can drive you back, and that is strong enough to help you if your legs get weak or numb from the local anesthetic. Take all of your medicines the morning of the procedure with just enough water to swallow them. If you have diabetes, make sure that you are scheduled to have your procedure done first thing in the morning, whenever possible. If you have diabetes, take only half of your insulin dose and notify our nurse that you have done so as soon as you arrive at the clinic. If you are diabetic, but only take blood sugar pills (oral hypoglycemic), then do not take them on the morning of your procedure.  You may take them after you have had the procedure. Do not take aspirin or any aspirin-containing medications, at least eleven (11) days prior to the procedure.  They may prolong bleeding. Wear loose fitting clothing that may be easy to take off and that you would not mind if it got stained with Betadine or blood. Do not wear any jewelry or perfume Remove any nail coloring.  It will interfere with some of our monitoring equipment.  NOTE: Remember that this is not meant to be interpreted as a complete list of all possible complications.  Unforeseen problems may occur.  BLOOD THINNERS The following drugs  contain aspirin or other products, which can cause increased bleeding during surgery and should not be taken for 2 weeks prior to and 1 week after surgery.  If you should need take something for relief of minor pain, you may take acetaminophen which is found in Tylenol,m Datril, Anacin-3 and Panadol. It is not blood thinner. The products listed below are.  Do not take any of the products listed below in addition to any listed on your instruction sheet.  A.P.C or A.P.C with Codeine Codeine Phosphate Capsules #3 Ibuprofen Ridaura  ABC compound Congesprin Imuran rimadil  Advil Cope Indocin Robaxisal  Alka-Seltzer Effervescent Pain Reliever and Antacid Coricidin or Coricidin-D  Indomethacin Rufen  Alka-Seltzer plus Cold Medicine Cosprin Ketoprofen S-A-C Tablets  Anacin Analgesic Tablets or Capsules Coumadin Korlgesic Salflex  Anacin Extra Strength Analgesic tablets or capsules CP-2 Tablets Lanoril Salicylate  Anaprox Cuprimine Capsules Levenox Salocol  Anexsia-D Dalteparin Magan Salsalate  Anodynos Darvon compound Magnesium Salicylate Sine-off  Ansaid Dasin Capsules Magsal Sodium Salicylate  Anturane Depen Capsules Marnal Soma  APF Arthritis pain formula Dewitt's Pills Measurin Stanback  Argesic Dia-Gesic Meclofenamic Sulfinpyrazone  Arthritis Bayer Timed Release Aspirin Diclofenac Meclomen Sulindac  Arthritis pain formula Anacin Dicumarol Medipren Supac  Analgesic (Safety coated) Arthralgen Diffunasal Mefanamic Suprofen  Arthritis Strength Bufferin Dihydrocodeine Mepro Compound Suprol  Arthropan liquid Dopirydamole Methcarbomol with Aspirin Synalgos  ASA tablets/Enseals Disalcid Micrainin Tagament  Ascriptin Doan's Midol Talwin  Ascriptin A/D Dolene Mobidin Tanderil  Ascriptin Extra Strength Dolobid Moblgesic Ticlid  Ascriptin with Codeine Doloprin or Doloprin with Codeine Momentum Tolectin  Asperbuf Duoprin Mono-gesic Trendar  Aspergum Duradyne Motrin or Motrin IB Triminicin  Aspirin  plain, buffered or enteric coated Durasal Myochrisine Trigesic  Aspirin Suppositories Easprin Nalfon Trillsate  Aspirin with Codeine Ecotrin Regular or Extra Strength Naprosyn Uracel  Atromid-S Efficin Naproxen Ursinus  Auranofin Capsules Elmiron Neocylate Vanquish  Axotal Emagrin Norgesic Verin  Azathioprine Empirin or Empirin with Codeine Normiflo Vitamin E  Azolid Emprazil Nuprin Voltaren  Bayer Aspirin plain, buffered or children's or timed BC Tablets or powders Encaprin Orgaran Warfarin Sodium  Buff-a-Comp Enoxaparin Orudis Zorpin  Buff-a-Comp with Codeine Equegesic Os-Cal-Gesic   Buffaprin Excedrin plain, buffered or Extra Strength Oxalid   Bufferin Arthritis Strength Feldene Oxphenbutazone   Bufferin plain or Extra Strength Feldene Capsules Oxycodone with Aspirin   Bufferin with Codeine Fenoprofen Fenoprofen Pabalate or Pabalate-SF   Buffets II Flogesic Panagesic   Buffinol plain or Extra Strength Florinal or Florinal with Codeine Panwarfarin   Buf-Tabs Flurbiprofen Penicillamine   Butalbital Compound Four-way cold tablets Penicillin   Butazolidin Fragmin Pepto-Bismol   Carbenicillin Geminisyn Percodan   Carna Arthritis Reliever Geopen Persantine   Carprofen Gold's salt Persistin   Chloramphenicol Goody's Phenylbutazone   Chloromycetin Haltrain Piroxlcam   Clmetidine heparin Plaquenil   Cllnoril Hyco-pap Ponstel   Clofibrate Hydroxy chloroquine Propoxyphen         Before stopping any of these medications, be sure to consult the physician who ordered them.  Some, such as Coumadin (Warfarin) are ordered to prevent or treat serious conditions such as "deep thrombosis", "pumonary embolisms", and other heart problems.  The amount of time that you may need off of the medication may also vary with the medication and the reason for which you were taking it.  If you are taking any of these medications, please make sure you notify your pain physician before you undergo any  procedures.         Epidural Steroid Injection Patient Information  Description: The epidural space surrounds the nerves as they exit the spinal cord.  In some patients, the nerves can be compressed and inflamed by a bulging disc or a tight spinal canal (spinal stenosis).  By injecting steroids into the epidural space, we can bring irritated nerves into direct contact with a potentially helpful medication.  These steroids act directly on the irritated nerves and can reduce swelling and inflammation which often leads to decreased pain.  Epidural steroids may be injected anywhere along the spine and from the neck to the low back depending upon the location of your pain.   After numbing the skin with local anesthetic (like Novocaine), a small needle is passed into the epidural space slowly.  You may experience  a sensation of pressure while this is being done.  The entire block usually last less than 10 minutes.  Conditions which may be treated by epidural steroids:  Low back and leg pain Neck and arm pain Spinal stenosis Post-laminectomy syndrome Herpes zoster (shingles) pain Pain from compression fractures  Preparation for the injection:  Do not eat any solid food or dairy products within 8 hours of your appointment.  You may drink clear liquids up to 3 hours before appointment.  Clear liquids include water, black coffee, juice or soda.  No milk or cream please. You may take your regular medication, including pain medications, with a sip of water before your appointment  Diabetics should hold regular insulin (if taken separately) and take 1/2 normal NPH dos the morning of the procedure.  Carry some sugar containing items with you to your appointment. A driver must accompany you and be prepared to drive you home after your procedure.  Bring all your current medications with your. An IV may be inserted and sedation may be given at the discretion of the physician.   A blood pressure cuff,  EKG and other monitors will often be applied during the procedure.  Some patients may need to have extra oxygen administered for a short period. You will be asked to provide medical information, including your allergies, prior to the procedure.  We must know immediately if you are taking blood thinners (like Coumadin/Warfarin)  Or if you are allergic to IV iodine contrast (dye). We must know if you could possible be pregnant.  Possible side-effects: Bleeding from needle site Infection (rare, may require surgery) Nerve injury (rare) Numbness & tingling (temporary) Difficulty urinating (rare, temporary) Spinal headache ( a headache worse with upright posture) Light -headedness (temporary) Pain at injection site (several days) Decreased blood pressure (temporary) Weakness in arm/leg (temporary) Pressure sensation in back/neck (temporary)  Call if you experience: Fever/chills associated with headache or increased back/neck pain. Headache worsened by an upright position. New onset weakness or numbness of an extremity below the injection site Hives or difficulty breathing (go to the emergency room) Inflammation or drainage at the infection site Severe back/neck pain Any new symptoms which are concerning to you  Please note:  Although the local anesthetic injected can often make your back or neck feel good for several hours after the injection, the pain will likely return.  It takes 3-7 days for steroids to work in the epidural space.  You may not notice any pain relief for at least that one week.  If effective, we will often do a series of three injections spaced 3-6 weeks apart to maximally decrease your pain.  After the initial series, we generally will wait several months before considering a repeat injection of the same type.  If you have any questions, please call [phone] [facility]

## 2023-03-06 NOTE — Progress Notes (Signed)
Safety precautions to be maintained throughout the outpatient stay will include: orient to surroundings, keep bed in low position, maintain call bell within reach at all times, provide assistance with transfer out of bed and ambulation.  

## 2023-03-07 ENCOUNTER — Telehealth: Payer: Self-pay

## 2023-03-07 NOTE — Telephone Encounter (Signed)
Spoke with DR Cherylann Ratel and he did not need clearance to stop the Plavix.  Alona Bene notified that clearance was not needed.

## 2023-03-07 NOTE — Telephone Encounter (Signed)
Do we have clearance for him to stop his plavix for 7 days

## 2023-03-14 ENCOUNTER — Encounter: Payer: Self-pay | Admitting: Student in an Organized Health Care Education/Training Program

## 2023-03-14 ENCOUNTER — Ambulatory Visit
Admission: RE | Admit: 2023-03-14 | Discharge: 2023-03-14 | Disposition: A | Discharge status: Home / Self Care | Payer: 59 | Source: Ambulatory Visit | Attending: Student in an Organized Health Care Education/Training Program | Admitting: Student in an Organized Health Care Education/Training Program

## 2023-03-14 ENCOUNTER — Ambulatory Visit
Payer: 59 | Attending: Student in an Organized Health Care Education/Training Program | Admitting: Student in an Organized Health Care Education/Training Program

## 2023-03-14 DIAGNOSIS — M5416 Radiculopathy, lumbar region: Secondary | ICD-10-CM | POA: Insufficient documentation

## 2023-03-14 DIAGNOSIS — G8929 Other chronic pain: Secondary | ICD-10-CM | POA: Insufficient documentation

## 2023-03-14 DIAGNOSIS — M48062 Spinal stenosis, lumbar region with neurogenic claudication: Secondary | ICD-10-CM | POA: Insufficient documentation

## 2023-03-14 DIAGNOSIS — M5116 Intervertebral disc disorders with radiculopathy, lumbar region: Secondary | ICD-10-CM | POA: Diagnosis present

## 2023-03-14 MED ORDER — SODIUM CHLORIDE 0.9% FLUSH
2.0000 mL | Freq: Once | INTRAVENOUS | Status: AC
  Administered 2023-03-14: 2 mL

## 2023-03-14 MED ORDER — LIDOCAINE HCL 2 % IJ SOLN
20.0000 mL | Freq: Once | INTRAMUSCULAR | Status: AC
  Administered 2023-03-14: 200 mg
  Filled 2023-03-14: qty 20

## 2023-03-14 MED ORDER — DEXAMETHASONE SODIUM PHOSPHATE 10 MG/ML IJ SOLN
10.0000 mg | Freq: Once | INTRAMUSCULAR | Status: AC
  Administered 2023-03-14: 10 mg
  Filled 2023-03-14: qty 1

## 2023-03-14 MED ORDER — SODIUM CHLORIDE (PF) 0.9 % IJ SOLN
INTRAMUSCULAR | Status: AC
  Filled 2023-03-14: qty 10

## 2023-03-14 MED ORDER — IOHEXOL 180 MG/ML  SOLN
10.0000 mL | Freq: Once | INTRAMUSCULAR | Status: AC
  Administered 2023-03-14: 10 mL via EPIDURAL
  Filled 2023-03-14: qty 20

## 2023-03-14 MED ORDER — ROPIVACAINE HCL 2 MG/ML IJ SOLN
2.0000 mL | Freq: Once | INTRAMUSCULAR | Status: AC
  Administered 2023-03-14: 2 mL via EPIDURAL
  Filled 2023-03-14: qty 20

## 2023-03-14 MED ORDER — GLYCOPYRROLATE 0.2 MG/ML IJ SOLN
0.2000 mg | Freq: Once | INTRAMUSCULAR | Status: AC
  Administered 2023-03-14: 0.2 mg via INTRAMUSCULAR

## 2023-03-14 NOTE — Progress Notes (Signed)
PROVIDER NOTE: Interpretation of information contained herein should be left to medically-trained personnel. Specific patient instructions are provided elsewhere under "Patient Instructions" section of medical record. This document was created in part using STT-dictation technology, any transcriptional errors that may result from this process are unintentional.  Patient: Steve Burnett Type: Established DOB: 02/20/1961 MRN: [id number] PCP: Mort Sawyers, FNP  Service: Procedure DOS: 03/14/2023 Setting: Ambulatory Location: Ambulatory outpatient facility Delivery: Face-to-face Provider: Edward Jolly, MD Specialty: Interventional Pain Management Specialty designation: 09 Location: Outpatient facility Ref. Prov.: Edward Jolly, MD       Interventional Therapy   Procedure: Lumbar epidural steroid injection (LESI) (interlaminar) #2   (ABORTED DUE TO PATIENT BECOMING VASOVAGAL 2/2 to PANIC ATTACK) Laterality: Left   Level:  L2-3 Level.  Imaging: Fluoroscopic guidance         Anesthesia: Local anesthesia (1-2% Lidocaine) DOS: 03/14/2023  Performed by: Edward Jolly, MD  Purpose: Diagnostic/Therapeutic Indications: Lumbar radicular pain of intraspinal etiology of more than 4 weeks that has failed to respond to conservative therapy and is severe enough to impact quality of life or function. 1. Chronic radicular lumbar pain   2. Lumbar disc herniation with radiculopathy (left)   3. 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  Prep solution: DuraPrep (Iodine Povacrylex [0.7% available iodine] and Isopropyl Alcohol, 74% w/w) Prep Area: Entire Posterior Lumbar Region from lower scapular tip down to mid buttocks area and from flank to  flank. Materials:  Tray: Epidural tray Needle(s):  Type: Epidural needle (Tuohy) Gauge (G):  22 Length: Regular (3.5-in) Qty: 1   Pre-op H&P Assessment:  Steve Burnett is a 62 y.o. (year old), male patient, seen today for interventional treatment. He  has a past surgical history that includes Cardiac catheterization (03/12/2012); Coronary angioplasty (03/12/2012); Coronary/Graft Acute MI Revascularization (N/A, 01/23/2018); LEFT HEART CATH AND CORONARY ANGIOGRAPHY (N/A, 01/23/2018); and IR INJECT DIAG/THERA/INC NEEDLE/CATH/PLC EPI/LUMB/SAC W/IMG (10/06/2022). Steve Burnett has a current medication list which includes the following prescription(s): accu-chek softclix lancets, accu-chek guide, carvedilol, clobetasol cream, clopidogrel, hydroxyzine, lidocaine, lisinopril, methylprednisolone, rosuvastatin, and spironolactone, and the following Facility-Administered Medications: glycopyrrolate. His primarily concern today is the Back Pain (L>R)  Initial Vital Signs:  Pulse/HCG Rate: 73ECG Heart Rate: 86 Temp: (!) 97.4 F (36.3 C) Resp: 18 BP: (!) 151/98 SpO2: 96 %  BMI: Estimated body mass index is 51.65 kg/m as calculated from the following:   Height as of this encounter: 5\' 10"  (1.778 m).   Weight as of this encounter: 360 lb (163.3 kg).  Risk Assessment: Allergies: Reviewed. He is allergic to flomax [tamsulosin], atorvastatin, and crestor [rosuvastatin calcium].  Allergy Precautions: None required Coagulopathies: Reviewed. None identified.  Blood-thinner therapy: None at this time Active Infection(s): Reviewed. None identified. Steve Burnett is afebrile  Site Confirmation: Steve Burnett was asked to confirm the procedure and laterality before marking the site Procedure checklist: Completed Consent: Before the procedure and under the influence of no sedative(s), amnesic(s), or anxiolytics, the patient was informed of the treatment options, risks and possible complications. To fulfill our ethical and  legal obligations, as recommended by the American Medical Association's Code of Ethics, I have informed the patient of my clinical impression; the nature and purpose of the treatment or  procedure; the risks, benefits, and possible complications of the intervention; the alternatives, including doing nothing; the risk(s) and benefit(s) of the alternative treatment(s) or procedure(s); and the risk(s) and benefit(s) of doing nothing. The patient was provided information about the general risks and possible complications associated with the procedure. These may include, but are not limited to: failure to achieve desired goals, infection, bleeding, organ or nerve damage, allergic reactions, paralysis, and death. In addition, the patient was informed of those risks and complications associated to Spine-related procedures, such as failure to decrease pain; infection (i.e.: Meningitis, epidural or intraspinal abscess); bleeding (i.e.: epidural hematoma, subarachnoid hemorrhage, or any other type of intraspinal or peri-dural bleeding); organ or nerve damage (i.e.: Any type of peripheral nerve, nerve root, or spinal cord injury) with subsequent damage to sensory, motor, and/or autonomic systems, resulting in permanent pain, numbness, and/or weakness of one or several areas of the body; allergic reactions; (i.e.: anaphylactic reaction); and/or death. Furthermore, the patient was informed of those risks and complications associated with the medications. These include, but are not limited to: allergic reactions (i.e.: anaphylactic or anaphylactoid reaction(s)); adrenal axis suppression; blood sugar elevation that in diabetics may result in ketoacidosis or comma; water retention that in patients with history of congestive heart failure may result in shortness of breath, pulmonary edema, and decompensation with resultant heart failure; weight gain; swelling or edema; medication-induced neural toxicity; particulate matter  embolism and blood vessel occlusion with resultant organ, and/or nervous system infarction; and/or aseptic necrosis of one or more joints. Finally, the patient was informed that Medicine is not an exact science; therefore, there is also the possibility of unforeseen or unpredictable risks and/or possible complications that may result in a catastrophic outcome. The patient indicated having understood very clearly. We have given the patient no guarantees and we have made no promises. Enough time was given to the patient to ask questions, all of which were answered to the patient's satisfaction. Mr. Gittens has indicated that he wanted to continue with the procedure. Attestation: I, the ordering provider, attest that I have discussed with the patient the benefits, risks, side-effects, alternatives, likelihood of achieving goals, and potential problems during recovery for the procedure that I have provided informed consent. Date  Time: 03/14/2023  9:27 AM   Pre-Procedure Preparation:  Monitoring: As per clinic protocol. Respiration, ETCO2, SpO2, BP, heart rate and rhythm monitor placed and checked for adequate function Safety Precautions: Patient was assessed for positional comfort and pressure points before starting the procedure. Time-out: I initiated and conducted the "Time-out" before starting the procedure, as per protocol. The patient was asked to participate by confirming the accuracy of the "Time Out" information. Verification of the correct person, site, and procedure were performed and confirmed by me, the nursing staff, and the patient. "Time-out" conducted as per Joint Commission's Universal Protocol (UP.01.01.01). Time: 1023 Start Time: 1023 hrs.  Description/Narrative of Procedure:          Target: Epidural space via interlaminar opening, initially targeting the lower laminar border of the superior vertebral body. Region: Lumbar Approach: Percutaneous paravertebral  Patient had a  vasovagal response during the procedure and was noted to have a anxiety/panic attack.  I decision was made at this time to abort the procedure and reschedule with IV sedation.  He was given 0.2 mg of glycopyrrolate which did help with the symptoms.  Vitals:   03/14/23 1029 03/14/23 1036 03/14/23 1040 03/14/23 1050  BP: (!) 143/83 120/78 132/76 128/83  Pulse:  Resp: 18 16 15 16   Temp:      TempSrc:      SpO2: 96% 98% 99% 99%  Weight:      Height:        Start Time: 1023 hrs. End Time:   hrs.  Imaging Guidance (Spinal):          Type of Imaging Technique: Fluoroscopy Guidance (Spinal) Indication(s): Assistance in needle guidance and placement for procedures requiring needle placement in or near specific anatomical locations not easily accessible without such assistance. Exposure Time: Please see nurses notes.   Antibiotic Prophylaxis:   Anti-infectives (From admission, onward)    None      Indication(s): None identified  Post-operative Assessment:  Post-procedure Vital Signs:  Pulse/HCG Rate: 7364 Temp: (!) 97.4 F (36.3 C) Resp: 16 BP: 128/83 SpO2: 99 %  EBL: None  Complications: No immediate post-treatment complications observed by team, or reported by patient.  Note: Procedure was aborted, patient developed a panic attack and became very anxious and subsequently had a vasovagal response.  Decision was made at this time to abort the procedure and reschedule with IV anxiolysis anxiolysis.  He was turned supine onto the stretcher.  He received 0.2 mg of glycopyrrolate  Plan of Care (POC)  Orders:  Orders Placed This Encounter  Procedures   DG PAIN CLINIC C-ARM 1-60 MIN NO REPORT    Intraoperative interpretation by procedural physician at [facility].    Standing Status:   Standing    Number of Occurrences:   1    Order Specific Question:   Reason for exam:    Answer:   Assistance in needle guidance and placement for procedures requiring needle  placement in or near specific anatomical locations not easily accessible without such assistance.   Ok to restart plavix tomorrow  Medications ordered for procedure: Meds ordered this encounter  Medications   iohexol (OMNIPAQUE) 180 MG/ML injection 10 mL    Must be Myelogram-compatible. If not available, you may substitute with a water-soluble, non-ionic, hypoallergenic, myelogram-compatible radiological contrast medium.   lidocaine (XYLOCAINE) 2 % (with pres) injection 400 mg   ropivacaine (PF) 2 mg/mL (0.2%) (NAROPIN) injection 2 mL   sodium chloride flush (NS) 0.9 % injection 2 mL   dexamethasone (DECADRON) injection 10 mg   glycopyrrolate (ROBINUL) injection 0.2 mg   Medications administered: We administered iohexol, lidocaine, ropivacaine (PF) 2 mg/mL (0.2%), sodium chloride flush, and dexamethasone.  See the medical record for exact dosing, route, and time of administration.  Follow-up plan:   Return in about 4 weeks (around 04/11/2023), or reschedule procedure.      Recent Visits Date Type Provider Dept  03/06/23 Office Visit Edward Jolly, MD [facility]  Showing recent visits within past 90 days and meeting all other requirements Today's Visits Date Type Provider Dept  03/14/23 Procedure visit Edward Jolly, MD [facility]  Showing today's visits and meeting all other requirements Future Appointments Date Type Provider Dept  03/19/23 Appointment Edward Jolly, MD [facility]  Showing future appointments within next 90 days and meeting all other requirements  Disposition: Discharge home  Discharge (Date  Time): 03/14/2023; 1054 hrs.   Primary Care Physician: Mort Sawyers, FNP Location: [facility] Note by: Edward Jolly, MD (TTS technology used. I apologize for any typographical errors that were not detected and corrected.) Date: 03/14/2023; Time: 1:48 PM  Disclaimer:  Medicine is not an Visual merchandiser. The  only guarantee in medicine is  that nothing is guaranteed. It is important to note that the decision to proceed with this intervention was based on the information collected from the patient. The Data and conclusions were drawn from the patient's questionnaire, the interview, and the physical examination. Because the information was provided in large part by the patient, it cannot be guaranteed that it has not been purposely or unconsciously manipulated. Every effort has been made to obtain as much relevant data as possible for this evaluation. It is important to note that the conclusions that lead to this procedure are derived in large part from the available data. Always take into account that the treatment will also be dependent on availability of resources and existing treatment guidelines, considered by other Pain Management Practitioners as being common knowledge and practice, at the time of the intervention. For Medico-Legal purposes, it is also important to point out that variation in procedural techniques and pharmacological choices are the acceptable norm. The indications, contraindications, technique, and results of the above procedure should only be interpreted and judged by a Board-Certified Interventional Pain Specialist with extensive familiarity and expertise in the same exact procedure and technique.

## 2023-03-14 NOTE — Progress Notes (Signed)
Safety precautions to be maintained throughout the outpatient stay will include: orient to surroundings, keep bed in low position, maintain call bell within reach at all times, provide assistance with transfer out of bed and ambulation.  

## 2023-03-14 NOTE — Patient Instructions (Addendum)
______________________________________________________________________    Preparing for your procedure  Appointments: If you think you may not be able to keep your appointment, call 24-48 hours in advance to cancel. We need time to make it available to others.  During your procedure appointment there will be: No Prescription Refills. No disability issues to discussed. No medication changes or discussions.  Instructions: Food intake: Avoid eating anything solid for at least 8 hours prior to your procedure. Clear liquid intake: You may take clear liquids such as water up to 2 hours prior to your procedure. (No carbonated drinks. No soda.) Transportation: Unless otherwise stated by your physician, bring a driver. (Driver cannot be a Market researcher, Pharmacist, community, or any other form of public transportation.) Morning Medicines: Except for blood thinners, take all of your other morning medications with a sip of water. Make sure to take your heart and blood pressure medicines. If your blood pressure's lower number is above 100, the case will be rescheduled. Blood thinners: Make sure to stop your blood thinners as instructed.  If you take a blood thinner, but were not instructed to stop it, call our office [phone] and ask to talk to a nurse. Not stopping a blood thinner prior to certain procedures could lead to serious complications. Diabetics on insulin: Notify the staff so that you can be scheduled 1st case in the morning. If your diabetes requires high dose insulin, take only  of your normal insulin dose the morning of the procedure and notify the staff that you have done so. Preventing infections: Shower with an antibacterial soap the morning of your procedure.  Build-up your immune system: Take 1000 mg of Vitamin C with every meal (3 times a day) the day prior to your procedure. Antibiotics: Inform the nursing staff if you are taking any antibiotics or if you have any conditions that may require antibiotics  prior to procedures. (Example: recent joint implants)   Pregnancy: If you are pregnant make sure to notify the nursing staff. Not doing so may result in injury to the fetus, including death.  Sickness: If you have a cold, fever, or any active infections, call and cancel or reschedule your procedure. Receiving steroids while having an infection may result in complications. Arrival: You must be in the facility at least 30 minutes prior to your scheduled procedure. Tardiness: Your scheduled time is also the cutoff time. If you do not arrive at least 15 minutes prior to your procedure, you will be rescheduled.  Children: Do not bring any children with you. Make arrangements to keep them home. Dress appropriately: There is always a possibility that your clothing may get soiled. Avoid long dresses. Valuables: Do not bring any jewelry or valuables.  Reasons to call and reschedule or cancel your procedure: (Following these recommendations will minimize the risk of a serious complication.) Surgeries: Avoid having procedures within 2 weeks of any surgery. (Avoid for 2 weeks before or after any surgery). Flu Shots: Avoid having procedures within 2 weeks of a flu shots or . (Avoid for 2 weeks before or after immunizations). Barium: Avoid having a procedure within 7-10 days after having had a radiological study involving the use of radiological contrast. (Myelograms, Barium swallow or enema study). Heart attacks: Avoid any elective procedures or surgeries for the initial 6 months after a "Myocardial Infarction" (Heart Attack). Blood thinners: It is imperative that you stop these medications before procedures. Let us know if you if you take any blood thinner.  Infection: Avoid procedures during or within  two weeks of an infection (including chest colds or gastrointestinal problems). Symptoms associated with infections include: Localized redness, fever, chills, night sweats or profuse sweating, burning sensation  when voiding, cough, congestion, stuffiness, runny nose, sore throat, diarrhea, nausea, vomiting, cold or Flu symptoms, recent or current infections. It is specially important if the infection is over the area that we intend to treat. Heart and lung problems: Symptoms that may suggest an active cardiopulmonary problem include: cough, chest pain, breathing difficulties or shortness of breath, dizziness, ankle swelling, uncontrolled high or unusually low blood pressure, and/or palpitations. If you are experiencing any of these symptoms, cancel your procedure and contact your primary care physician for an evaluation.  Remember:  Regular Business hours are:  Monday to Thursday 8:00 AM to 4:00 PM  Provider's Schedule: Delano Metz, MD:  Procedure days: Tuesday and Thursday 7:30 AM to 4:00 PM  Edward Jolly, MD:  Procedure days: Monday and Wednesday 7:30 AM to 4:00 PM Last  Updated: 03/13/2023 ______________________________________________________________________     ______________________________________________________________________    Post-Procedure Discharge Instructions  Instructions: Apply ice:  Purpose: This will minimize any swelling and discomfort after procedure.  When: Day of procedure, as soon as you get home. How: Fill a plastic sandwich bag with crushed ice. Cover it with a small towel and apply to injection site. How long: (15 min on, 15 min off) Apply for 15 minutes then remove x 15 minutes.  Repeat sequence on day of procedure, until you go to bed. Apply heat:  Purpose: To treat any soreness and discomfort from the procedure. When: Starting the next day after the procedure. How: Apply heat to procedure site starting the day following the procedure. How long: May continue to repeat daily, until discomfort goes away. Food intake: Start with clear liquids (like water) and advance to regular food, as tolerated.  Physical activities: Keep activities to a minimum for the  first 8 hours after the procedure. After that, then as tolerated. Driving: If you have received any sedation, be responsible and do not drive. You are not allowed to drive for 24 hours after having sedation. Blood thinner: (Applies only to those taking blood thinners) You may restart your blood thinner 6 hours after your procedure. Insulin: (Applies only to Diabetic patients taking insulin) As soon as you can eat, you may resume your normal dosing schedule. Infection prevention: Keep procedure site clean and dry. Shower daily and clean area with soap and water. Post-procedure Pain Diary: Extremely important that this be done correctly and accurately. Recorded information will be used to determine the next step in treatment. For the purpose of accuracy, follow these rules: Evaluate only the area treated. Do not report or include pain from an untreated area. For the purpose of this evaluation, ignore all other areas of pain, except for the treated area. After your procedure, avoid taking a long nap and attempting to complete the pain diary after you wake up. Instead, set your alarm clock to go off every hour, on the hour, for the initial 8 hours after the procedure. Document the duration of the numbing medicine, and the relief you are getting from it. Do not go to sleep and attempt to complete it later. It will not be accurate. If you received sedation, it is likely that you were given a medication that may cause amnesia. Because of this, completing the diary at a later time may cause the information to be inaccurate. This information is needed to plan your care. Follow-up appointment:  Keep your post-procedure follow-up evaluation appointment after the procedure (usually 2 weeks for most procedures, 6 weeks for radiofrequencies). DO NOT FORGET to bring you pain diary with you.   Expect: (What should I expect to see with my procedure?) From numbing medicine (AKA: Local Anesthetics): Numbness or decrease in  pain. You may also experience some weakness, which if present, could last for the duration of the local anesthetic. Onset: Full effect within 15 minutes of injected. Duration: It will depend on the type of local anesthetic used. On the average, 1 to 8 hours.  From steroids (Applies only if steroids were used): Decrease in swelling or inflammation. Once inflammation is improved, relief of the pain will follow. Onset of benefits: Depends on the amount of swelling present. The more swelling, the longer it will take for the benefits to be seen. In some cases, up to 10 days. Duration: Steroids will stay in the system x 2 weeks. Duration of benefits will depend on multiple posibilities including persistent irritating factors. Side-effects: If present, they may typically last 2 weeks (the duration of the steroids). Frequent: Cramps (if they occur, drink Gatorade and take over-the-counter Magnesium 450-500 mg once to twice a day); water retention with temporary weight gain; increases in blood sugar; decreased immune system response; increased appetite. Occasional: Facial flushing (red, warm cheeks); mood swings; menstrual changes. Uncommon: Long-term decrease or suppression of natural hormones; bone thinning. (These are more common with higher doses or more frequent use. This is why we prefer that our patients avoid having any injection therapies in other practices.)  Very Rare: Severe mood changes; psychosis; aseptic necrosis. From procedure: Some discomfort is to be expected once the numbing medicine wears off. This should be minimal if ice and heat are applied as instructed.  Call if: (When should I call?) You experience numbness and weakness that gets worse with time, as opposed to wearing off. New onset bowel or bladder incontinence. (Applies only to procedures done in the spine)  Emergency Numbers: Durning business hours (Monday - Thursday, 8:00 AM - 4:00 PM) (Friday, 9:00 AM - 12:00 Noon): [phone]  [phone] After hours: [phone] NOTE: If you are having a problem and are unable connect with, or to talk to a provider, then go to your nearest urgent care or emergency department. If the problem is serious and urgent, please call 911. ______________________________________________________________________

## 2023-03-15 MED ORDER — HYDROXYZINE HCL 25 MG PO TABS: 25.0000 mg | ORAL_TABLET | Freq: Three times a day (TID) | ORAL | 0 refills | Status: DC | PRN

## 2023-03-15 NOTE — Addendum Note (Signed)
Addended by: Donnamarie Poag on: 03/15/2023 08:42 AM   Modules accepted: Orders

## 2023-03-15 NOTE — Telephone Encounter (Signed)
Called patient states that he should have a refill but pharmacy is telling him he does not. Advised I will call pharmacy and see if any on hold.

## 2023-03-15 NOTE — Telephone Encounter (Signed)
I have reached out to pharmacy, they do not have any refills for patient. Patient has follow up next week with Tabitha but would like refill before procedure on Monday. I have sent in 1 30 day refill for patient to have coverage until he is seen in office.

## 2023-03-19 ENCOUNTER — Encounter: Payer: Self-pay | Admitting: Student in an Organized Health Care Education/Training Program

## 2023-03-19 ENCOUNTER — Ambulatory Visit
Payer: 59 | Attending: Student in an Organized Health Care Education/Training Program | Admitting: Student in an Organized Health Care Education/Training Program

## 2023-03-19 ENCOUNTER — Ambulatory Visit
Admission: RE | Admit: 2023-03-19 | Discharge: 2023-03-19 | Disposition: A | Discharge status: Home / Self Care | Payer: 59 | Source: Ambulatory Visit | Attending: Student in an Organized Health Care Education/Training Program | Admitting: Student in an Organized Health Care Education/Training Program

## 2023-03-19 VITALS — BP 183/106 | HR 75 | Temp 98.4°F | Resp 16 | Ht 70.0 in | Wt 360.0 lb

## 2023-03-19 DIAGNOSIS — M5416 Radiculopathy, lumbar region: Secondary | ICD-10-CM | POA: Insufficient documentation

## 2023-03-19 DIAGNOSIS — G8929 Other chronic pain: Secondary | ICD-10-CM | POA: Diagnosis present

## 2023-03-19 DIAGNOSIS — M5116 Intervertebral disc disorders with radiculopathy, lumbar region: Secondary | ICD-10-CM | POA: Insufficient documentation

## 2023-03-19 DIAGNOSIS — M48062 Spinal stenosis, lumbar region with neurogenic claudication: Secondary | ICD-10-CM | POA: Insufficient documentation

## 2023-03-19 MED ORDER — DEXAMETHASONE SODIUM PHOSPHATE 10 MG/ML IJ SOLN
10.0000 mg | Freq: Once | INTRAMUSCULAR | Status: AC
  Administered 2023-03-19: 10 mg
  Filled 2023-03-19: qty 1

## 2023-03-19 MED ORDER — SODIUM CHLORIDE 0.9% FLUSH
2.0000 mL | Freq: Once | INTRAVENOUS | Status: AC
  Administered 2023-03-19: 2 mL

## 2023-03-19 MED ORDER — LACTATED RINGERS IV SOLN: Freq: Once | INTRAVENOUS | Status: AC

## 2023-03-19 MED ORDER — MIDAZOLAM HCL 2 MG/2ML IJ SOLN
0.5000 mg | Freq: Once | INTRAMUSCULAR | Status: AC
  Administered 2023-03-19: 2 mg via INTRAVENOUS
  Filled 2023-03-19: qty 2

## 2023-03-19 MED ORDER — LIDOCAINE HCL 2 % IJ SOLN
20.0000 mL | Freq: Once | INTRAMUSCULAR | Status: AC
  Administered 2023-03-19: 200 mg
  Filled 2023-03-19: qty 20

## 2023-03-19 MED ORDER — IOHEXOL 180 MG/ML  SOLN
10.0000 mL | Freq: Once | INTRAMUSCULAR | Status: AC
  Administered 2023-03-19: 10 mL via EPIDURAL
  Filled 2023-03-19: qty 20

## 2023-03-19 MED ORDER — SODIUM CHLORIDE (PF) 0.9 % IJ SOLN
INTRAMUSCULAR | Status: AC
  Filled 2023-03-19: qty 10

## 2023-03-19 MED ORDER — ROPIVACAINE HCL 2 MG/ML IJ SOLN
2.0000 mL | Freq: Once | INTRAMUSCULAR | Status: AC
  Administered 2023-03-19: 2 mL via EPIDURAL
  Filled 2023-03-19: qty 20

## 2023-03-19 NOTE — Patient Instructions (Addendum)
______________________________________________________________________    Post-Procedure Discharge Instructions  Instructions: Apply ice:  Purpose: This will minimize any swelling and discomfort after procedure.  When: Day of procedure, as soon as you get home. How: Fill a plastic sandwich bag with crushed ice. Cover it with a small towel and apply to injection site. How long: (15 min on, 15 min off) Apply for 15 minutes then remove x 15 minutes.  Repeat sequence on day of procedure, until you go to bed. Apply heat:  Purpose: To treat any soreness and discomfort from the procedure. When: Starting the next day after the procedure. How: Apply heat to procedure site starting the day following the procedure. How long: May continue to repeat daily, until discomfort goes away. Food intake: Start with clear liquids (like water) and advance to regular food, as tolerated.  Physical activities: Keep activities to a minimum for the first 8 hours after the procedure. After that, then as tolerated. Driving: If you have received any sedation, be responsible and do not drive. You are not allowed to drive for 24 hours after having sedation. Blood thinner: (Applies only to those taking blood thinners) You may restart your blood thinner 6 hours after your procedure. Insulin: (Applies only to Diabetic patients taking insulin) As soon as you can eat, you may resume your normal dosing schedule. Infection prevention: Keep procedure site clean and dry. Shower daily and clean area with soap and water. Post-procedure Pain Diary: Extremely important that this be done correctly and accurately. Recorded information will be used to determine the next step in treatment. For the purpose of accuracy, follow these rules: Evaluate only the area treated. Do not report or include pain from an untreated area. For the purpose of this evaluation, ignore all other areas of pain, except for the treated area. After your procedure,  avoid taking a long nap and attempting to complete the pain diary after you wake up. Instead, set your alarm clock to go off every hour, on the hour, for the initial 8 hours after the procedure. Document the duration of the numbing medicine, and the relief you are getting from it. Do not go to sleep and attempt to complete it later. It will not be accurate. If you received sedation, it is likely that you were given a medication that may cause amnesia. Because of this, completing the diary at a later time may cause the information to be inaccurate. This information is needed to plan your care. Follow-up appointment: Keep your post-procedure follow-up evaluation appointment after the procedure (usually 2 weeks for most procedures, 6 weeks for radiofrequencies). DO NOT FORGET to bring you pain diary with you.   Expect: (What should I expect to see with my procedure?) From numbing medicine (AKA: Local Anesthetics): Numbness or decrease in pain. You may also experience some weakness, which if present, could last for the duration of the local anesthetic. Onset: Full effect within 15 minutes of injected. Duration: It will depend on the type of local anesthetic used. On the average, 1 to 8 hours.  From steroids (Applies only if steroids were used): Decrease in swelling or inflammation. Once inflammation is improved, relief of the pain will follow. Onset of benefits: Depends on the amount of swelling present. The more swelling, the longer it will take for the benefits to be seen. In some cases, up to 10 days. Duration: Steroids will stay in the system x 2 weeks. Duration of benefits will depend on multiple posibilities including persistent irritating factors. Side-effects: If  present, they may typically last 2 weeks (the duration of the steroids). Frequent: Cramps (if they occur, drink Gatorade and take over-the-counter Magnesium 450-500 mg once to twice a day); water retention with temporary weight gain;  increases in blood sugar; decreased immune system response; increased appetite. Occasional: Facial flushing (red, warm cheeks); mood swings; menstrual changes. Uncommon: Long-term decrease or suppression of natural hormones; bone thinning. (These are more common with higher doses or more frequent use. This is why we prefer that our patients avoid having any injection therapies in other practices.)  Very Rare: Severe mood changes; psychosis; aseptic necrosis. From procedure: Some discomfort is to be expected once the numbing medicine wears off. This should be minimal if ice and heat are applied as instructed.  Call if: (When should I call?) You experience numbness and weakness that gets worse with time, as opposed to wearing off. New onset bowel or bladder incontinence. (Applies only to procedures done in the spine)  Emergency Numbers: Durning business hours (Monday - Thursday, 8:00 AM - 4:00 PM) (Friday, 9:00 AM - 12:00 Noon): [phone] After hours: [phone] NOTE: If you are having a problem and are unable connect with, or to talk to a provider, then go to your nearest urgent care or emergency department. If the problem is serious and urgent, please call 911. ______________________________________________________________________    Bring your Post Procedure Diary to follow-up.

## 2023-03-19 NOTE — Progress Notes (Signed)
Safety precautions to be maintained throughout the outpatient stay will include: orient to surroundings, keep bed in low position, maintain call bell within reach at all times, provide assistance with transfer out of bed and ambulation.  

## 2023-03-19 NOTE — Progress Notes (Signed)
PROVIDER NOTE: Interpretation of information contained herein should be left to medically-trained personnel. Specific patient instructions are provided elsewhere under "Patient Instructions" section of medical record. This document was created in part using STT-dictation technology, any transcriptional errors that may result from this process are unintentional.  Patient: Steve Burnett Type: Established DOB: 02-07-1961 MRN: [id number] PCP: Mort Sawyers, FNP  Service: Procedure DOS: 03/19/2023 Setting: Ambulatory Location: Ambulatory outpatient facility Delivery: Face-to-face Provider: Edward Jolly, MD Specialty: Interventional Pain Management Specialty designation: 09 Location: Outpatient facility Ref. Prov.: Mort Sawyers, FNP       Interventional Therapy   Procedure: Lumbar epidural steroid injection (LESI) (interlaminar) #2    Laterality: Left   Level:  L2-3 Level.  Imaging: Fluoroscopic guidance         Anesthesia: Local anesthesia (1-2% Lidocaine) Sedation: IV Versed DOS: 03/19/2023  Performed by: Edward Jolly, MD  Purpose: Diagnostic/Therapeutic Indications: Lumbar radicular pain of intraspinal etiology of more than 4 weeks that has failed to respond to conservative therapy and is severe enough to impact quality of life or function. 1. Chronic radicular lumbar pain   2. Lumbar disc herniation with radiculopathy (left)   3. 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  Prep solution: DuraPrep (Iodine Povacrylex [0.7% available iodine] and Isopropyl Alcohol, 74% w/w) Prep Area: Entire Posterior Lumbar Region from lower scapular tip down to mid buttocks area and from flank to flank. Materials:  Tray: Epidural  tray Needle(s):  Type: Epidural needle (Tuohy) Gauge (G):  17 Length: Regular (3.5-in) Qty: 1   Pre-op H&P Assessment:  Steve Burnett is a 62 y.o. (year old), male patient, seen today for interventional treatment. He  has a past surgical history that includes Cardiac catheterization (03/12/2012); Coronary angioplasty (03/12/2012); Coronary/Graft Acute MI Revascularization (N/A, 01/23/2018); LEFT HEART CATH AND CORONARY ANGIOGRAPHY (N/A, 01/23/2018); and IR INJECT DIAG/THERA/INC NEEDLE/CATH/PLC EPI/LUMB/SAC W/IMG (10/06/2022). Steve Burnett has a current medication list which includes the following prescription(s): accu-chek softclix lancets, accu-chek guide, carvedilol, clobetasol cream, clopidogrel, hydroxyzine, lidocaine, lisinopril, methylprednisolone, rosuvastatin, and spironolactone, and the following Facility-Administered Medications: lactated ringers. His primarily concern today is the Back Pain  Initial Vital Signs:  Pulse/HCG Rate: 75ECG Heart Rate: 83 Temp: 98.4 F (36.9 C) Resp: (!) 22 BP: (!) 161/100 SpO2: 98 %  BMI: Estimated body mass index is 51.65 kg/m as calculated from the following:   Height as of this encounter: 5\' 10"  (1.778 m).   Weight as of this encounter: 360 lb (163.3 kg).  Risk Assessment: Allergies: Reviewed. He is allergic to flomax [tamsulosin], atorvastatin, and crestor [rosuvastatin calcium].  Allergy Precautions: None required Coagulopathies: Reviewed. None identified.  Blood-thinner therapy: None at this time Active Infection(s): Reviewed. None identified. Steve Burnett is afebrile  Site Confirmation: Steve Burnett was asked to confirm the procedure and laterality before marking the site Procedure checklist: Completed Consent: Before the procedure and under the influence of no sedative(s), amnesic(s), or anxiolytics, the patient was informed of the treatment options, risks and possible complications. To fulfill our ethical and legal obligations, as recommended by  the American Medical Association's Code of Ethics, I have informed the patient of my clinical impression; the nature and purpose of the treatment or procedure; the risks, benefits, and possible  complications of the intervention; the alternatives, including doing nothing; the risk(s) and benefit(s) of the alternative treatment(s) or procedure(s); and the risk(s) and benefit(s) of doing nothing. The patient was provided information about the general risks and possible complications associated with the procedure. These may include, but are not limited to: failure to achieve desired goals, infection, bleeding, organ or nerve damage, allergic reactions, paralysis, and death. In addition, the patient was informed of those risks and complications associated to Spine-related procedures, such as failure to decrease pain; infection (i.e.: Meningitis, epidural or intraspinal abscess); bleeding (i.e.: epidural hematoma, subarachnoid hemorrhage, or any other type of intraspinal or peri-dural bleeding); organ or nerve damage (i.e.: Any type of peripheral nerve, nerve root, or spinal cord injury) with subsequent damage to sensory, motor, and/or autonomic systems, resulting in permanent pain, numbness, and/or weakness of one or several areas of the body; allergic reactions; (i.e.: anaphylactic reaction); and/or death. Furthermore, the patient was informed of those risks and complications associated with the medications. These include, but are not limited to: allergic reactions (i.e.: anaphylactic or anaphylactoid reaction(s)); adrenal axis suppression; blood sugar elevation that in diabetics may result in ketoacidosis or comma; water retention that in patients with history of congestive heart failure may result in shortness of breath, pulmonary edema, and decompensation with resultant heart failure; weight gain; swelling or edema; medication-induced neural toxicity; particulate matter embolism and blood vessel occlusion with  resultant organ, and/or nervous system infarction; and/or aseptic necrosis of one or more joints. Finally, the patient was informed that Medicine is not an exact science; therefore, there is also the possibility of unforeseen or unpredictable risks and/or possible complications that may result in a catastrophic outcome. The patient indicated having understood very clearly. We have given the patient no guarantees and we have made no promises. Enough time was given to the patient to ask questions, all of which were answered to the patient's satisfaction. Mr. Sopp has indicated that he wanted to continue with the procedure. Attestation: I, the ordering provider, attest that I have discussed with the patient the benefits, risks, side-effects, alternatives, likelihood of achieving goals, and potential problems during recovery for the procedure that I have provided informed consent. Date  Time: 03/19/2023 11:26 AM   Pre-Procedure Preparation:  Monitoring: As per clinic protocol. Respiration, ETCO2, SpO2, BP, heart rate and rhythm monitor placed and checked for adequate function Safety Precautions: Patient was assessed for positional comfort and pressure points before starting the procedure. Time-out: I initiated and conducted the "Time-out" before starting the procedure, as per protocol. The patient was asked to participate by confirming the accuracy of the "Time Out" information. Verification of the correct person, site, and procedure were performed and confirmed by me, the nursing staff, and the patient. "Time-out" conducted as per Joint Commission's Universal Protocol (UP.01.01.01). Time: 1152 Start Time: 1152 hrs.  Description/Narrative of Procedure:          Target: Epidural space via interlaminar opening, initially targeting the lower laminar border of the superior vertebral body. Region: Lumbar Approach: Percutaneous paravertebral  Rationale (medical necessity): procedure needed and proper for  the diagnosis and/or treatment of the patient's medical symptoms and needs. Procedural Technique Safety Precautions: Aspiration looking for blood return was conducted prior to all injections. At no point did we inject any substances, as a needle was being advanced. No attempts were made at seeking any paresthesias. Safe injection practices and needle disposal techniques used. Medications properly checked for expiration dates. SDV (single dose vial) medications used. Description  of the Procedure: Protocol guidelines were followed. The procedure needle was introduced through the skin, ipsilateral to the reported pain, and advanced to the target area. Bone was contacted and the needle walked caudad, until the lamina was cleared. The epidural space was identified using "loss-of-resistance technique" with 2-3 ml of PF-NaCl (0.9% NSS), in a 5cc LOR glass syringe.  Vitals:   03/19/23 1133 03/19/23 1148 03/19/23 1155  BP: (!) 161/100 (!) 139/108 (!) 161/110  Pulse: 75    Resp:  (!) 22 (!) 32  Temp: 98.4 F (36.9 C)    TempSrc: Temporal    SpO2: 98% 96% 97%  Weight: (!) 360 lb (163.3 kg)    Height: 5\' 10"  (1.778 m)      Start Time: 1152 hrs. End Time: 1156 hrs.  Imaging Guidance (Spinal):          Type of Imaging Technique: Fluoroscopy Guidance (Spinal) Indication(s): Assistance in needle guidance and placement for procedures requiring needle placement in or near specific anatomical locations not easily accessible without such assistance. Exposure Time: Please see nurses notes. Contrast: Before injecting any contrast, we confirmed that the patient did not have an allergy to iodine, shellfish, or radiological contrast. Once satisfactory needle placement was completed at the desired level, radiological contrast was injected. Contrast injected under live fluoroscopy. No contrast complications. See chart for type and volume of contrast used. Fluoroscopic Guidance: I was personally present during the use  of fluoroscopy. "Tunnel Vision Technique" used to obtain the best possible view of the target area. Parallax error corrected before commencing the procedure. "Direction-depth-direction" technique used to introduce the needle under continuous pulsed fluoroscopy. Once target was reached, antero-posterior, oblique, and lateral fluoroscopic projection used confirm needle placement in all planes. Images permanently stored in EMR. Interpretation: I personally interpreted the imaging intraoperatively. Adequate needle placement confirmed in multiple planes. Appropriate spread of contrast into desired area was observed. No evidence of afferent or efferent intravascular uptake. No intrathecal or subarachnoid spread observed. Permanent images saved into the patient's record.  Antibiotic Prophylaxis:   Anti-infectives (From admission, onward)    None      Indication(s): None identified  Post-operative Assessment:  Post-procedure Vital Signs:  Pulse/HCG Rate: 7586 Temp: 98.4 F (36.9 C) Resp: (!) 32 BP: (!) 161/110 SpO2: 97 %  EBL: None  Complications: No immediate post-treatment complications observed by team, or reported by patient.  Note: The patient tolerated the entire procedure well. A repeat set of vitals were taken after the procedure and the patient was kept under observation following institutional policy, for this type of procedure. Post-procedural neurological assessment was performed, showing return to baseline, prior to discharge. The patient was provided with post-procedure discharge instructions, including a section on how to identify potential problems. Should any problems arise concerning this procedure, the patient was given instructions to immediately contact us, at any time, without hesitation. In any case, we plan to contact the patient by telephone for a follow-up status report regarding this interventional procedure.  Comments:  No additional relevant information.  Plan of  Care (POC)  Orders:  Orders Placed This Encounter  Procedures   DG PAIN CLINIC C-ARM 1-60 MIN NO REPORT    Intraoperative interpretation by procedural physician at [facility].    Standing Status:   Standing    Number of Occurrences:   1    Order Specific Question:   Reason for exam:    Answer:   Assistance in needle guidance and placement for procedures  requiring needle placement in or near specific anatomical locations not easily accessible without such assistance.   Ok to restart plavix tomorrow  Medications ordered for procedure: Meds ordered this encounter  Medications   iohexol (OMNIPAQUE) 180 MG/ML injection 10 mL    Must be Myelogram-compatible. If not available, you may substitute with a water-soluble, non-ionic, hypoallergenic, myelogram-compatible radiological contrast medium.   lidocaine (XYLOCAINE) 2 % (with pres) injection 400 mg   lactated ringers infusion   midazolam (VERSED) injection 0.5-2 mg    Make sure Flumazenil is available in the pyxis when using this medication. If oversedation occurs, administer 0.2 mg IV over 15 sec. If after 45 sec no response, administer 0.2 mg again over 1 min; may repeat at 1 min intervals; not to exceed 4 doses (1 mg)   ropivacaine (PF) 2 mg/mL (0.2%) (NAROPIN) injection 2 mL   sodium chloride flush (NS) 0.9 % injection 2 mL   dexamethasone (DECADRON) injection 10 mg   Medications administered: We administered iohexol, lidocaine, lactated ringers, midazolam, ropivacaine (PF) 2 mg/mL (0.2%), sodium chloride flush, and dexamethasone.  See the medical record for exact dosing, route, and time of administration.  Follow-up plan:   Return in about 4 weeks (around 04/16/2023), or F2F, PPE.      Recent Visits Date Type Provider Dept  03/14/23 Procedure visit Edward Jolly, MD [facility]  03/06/23 Office Visit Edward Jolly, MD [facility]  Showing recent visits within past 90 days and meeting all other  requirements Today's Visits Date Type Provider Dept  03/19/23 Procedure visit Edward Jolly, MD [facility]  Showing today's visits and meeting all other requirements Future Appointments No visits were found meeting these conditions. Showing future appointments within next 90 days and meeting all other requirements  Disposition: Discharge home  Discharge (Date  Time): 03/19/2023;   hrs.   Primary Care Physician: Mort Sawyers, FNP Location: [facility] Note by: Edward Jolly, MD (TTS technology used. I apologize for any typographical errors that were not detected and corrected.) Date: 03/19/2023; Time: 11:58 AM  Disclaimer:  Medicine is not an Visual merchandiser. The only guarantee in medicine is that nothing is guaranteed. It is important to note that the decision to proceed with this intervention was based on the information collected from the patient. The Data and conclusions were drawn from the patient's questionnaire, the interview, and the physical examination. Because the information was provided in large part by the patient, it cannot be guaranteed that it has not been purposely or unconsciously manipulated. Every effort has been made to obtain as much relevant data as possible for this evaluation. It is important to note that the conclusions that lead to this procedure are derived in large part from the available data. Always take into account that the treatment will also be dependent on availability of resources and existing treatment guidelines, considered by other Pain Management Practitioners as being common knowledge and practice, at the time of the intervention. For Medico-Legal purposes, it is also important to point out that variation in procedural techniques and pharmacological choices are the acceptable norm. The indications, contraindications, technique, and results of the above procedure should only be interpreted and judged by a Board-Certified  Interventional Pain Specialist with extensive familiarity and expertise in the same exact procedure and technique.

## 2023-03-20 ENCOUNTER — Telehealth: Payer: Self-pay | Admitting: *Deleted

## 2023-03-20 NOTE — Telephone Encounter (Signed)
No problems post procedure. 

## 2023-03-22 ENCOUNTER — Ambulatory Visit (INDEPENDENT_AMBULATORY_CARE_PROVIDER_SITE_OTHER): Payer: 59 | Admitting: Family

## 2023-03-22 ENCOUNTER — Encounter: Payer: Self-pay | Admitting: Family

## 2023-03-22 ENCOUNTER — Other Ambulatory Visit (INDEPENDENT_AMBULATORY_CARE_PROVIDER_SITE_OTHER): Payer: 59

## 2023-03-22 VITALS — BP 150/105 | HR 60 | Temp 97.6°F | Ht 70.0 in | Wt 370.0 lb

## 2023-03-22 DIAGNOSIS — F411 Generalized anxiety disorder: Secondary | ICD-10-CM | POA: Insufficient documentation

## 2023-03-22 DIAGNOSIS — E1165 Type 2 diabetes mellitus with hyperglycemia: Secondary | ICD-10-CM

## 2023-03-22 DIAGNOSIS — E785 Hyperlipidemia, unspecified: Secondary | ICD-10-CM

## 2023-03-22 DIAGNOSIS — R718 Other abnormality of red blood cells: Secondary | ICD-10-CM

## 2023-03-22 DIAGNOSIS — I10 Essential (primary) hypertension: Secondary | ICD-10-CM

## 2023-03-22 DIAGNOSIS — F419 Anxiety disorder, unspecified: Secondary | ICD-10-CM

## 2023-03-22 DIAGNOSIS — E1169 Type 2 diabetes mellitus with other specified complication: Secondary | ICD-10-CM

## 2023-03-22 DIAGNOSIS — E782 Mixed hyperlipidemia: Secondary | ICD-10-CM

## 2023-03-22 LAB — CBC WITH DIFFERENTIAL/PLATELET
Basophils Absolute: 0 10*3/uL (ref 0.0–0.1)
Basophils Relative: 0.6 % (ref 0.0–3.0)
Eosinophils Absolute: 0.1 10*3/uL (ref 0.0–0.7)
Eosinophils Relative: 1 % (ref 0.0–5.0)
HCT: 44 % (ref 39.0–52.0)
Hemoglobin: 14 g/dL (ref 13.0–17.0)
Lymphocytes Relative: 28.4 % (ref 12.0–46.0)
Lymphs Abs: 2.3 10*3/uL (ref 0.7–4.0)
MCHC: 31.7 g/dL (ref 30.0–36.0)
MCV: 78.9 fl (ref 78.0–100.0)
Monocytes Absolute: 0.6 10*3/uL (ref 0.1–1.0)
Monocytes Relative: 7.7 % (ref 3.0–12.0)
Neutro Abs: 5.1 10*3/uL (ref 1.4–7.7)
Neutrophils Relative %: 62.3 % (ref 43.0–77.0)
Platelets: 316 10*3/uL (ref 150.0–400.0)
RBC: 5.58 Mil/uL (ref 4.22–5.81)
RDW: 16.1 % — ABNORMAL HIGH (ref 11.5–15.5)
WBC: 8.2 10*3/uL (ref 4.0–10.5)

## 2023-03-22 LAB — COMPREHENSIVE METABOLIC PANEL
ALT: 17 U/L (ref 0–53)
AST: 14 U/L (ref 0–37)
Albumin: 3.8 g/dL (ref 3.5–5.2)
Alkaline Phosphatase: 59 U/L (ref 39–117)
BUN: 21 mg/dL (ref 6–23)
CO2: 27 mEq/L (ref 19–32)
Calcium: 9.4 mg/dL (ref 8.4–10.5)
Chloride: 99 mEq/L (ref 96–112)
Creatinine, Ser: 0.86 mg/dL (ref 0.40–1.50)
GFR: 94.56 mL/min (ref >60.00)
Glucose, Bld: 108 mg/dL — ABNORMAL HIGH (ref 70–99)
Potassium: 4 mEq/L (ref 3.5–5.1)
Sodium: 134 mEq/L — ABNORMAL LOW (ref 135–145)
Total Bilirubin: 0.5 mg/dL (ref 0.2–1.2)
Total Protein: 7.1 g/dL (ref 6.0–8.3)

## 2023-03-22 LAB — MICROALBUMIN / CREATININE URINE RATIO
Creatinine,U: 74 mg/dL
Microalb Creat Ratio: 0.9 mg/g (ref 0.0–30.0)
Microalb, Ur: <0.7 mg/dL (ref 0.0–1.9)

## 2023-03-22 LAB — LIPID PANEL
Cholesterol: 175 mg/dL (ref 0–200)
HDL: 37.5 mg/dL — ABNORMAL LOW (ref >39.00)
NonHDL: 137.17
Total CHOL/HDL Ratio: 5
Triglycerides: 259 mg/dL — ABNORMAL HIGH (ref 0.0–149.0)
VLDL: 51.8 mg/dL — ABNORMAL HIGH (ref 0.0–40.0)

## 2023-03-22 LAB — MAGNESIUM: Magnesium: 1.8 mg/dL (ref 1.5–2.5)

## 2023-03-22 LAB — LDL CHOLESTEROL, DIRECT: Direct LDL: 129 mg/dL

## 2023-03-22 LAB — HEMOGLOBIN A1C: Hgb A1c MFr Bld: 7.8 % — ABNORMAL HIGH (ref 4.6–6.5)

## 2023-03-22 MED ORDER — BUSPIRONE HCL 5 MG PO TABS
5.0000 mg | ORAL_TABLET | Freq: Two times a day (BID) | ORAL | 2 refills | Status: DC
Start: 2023-03-22 — End: 2023-06-27

## 2023-03-22 NOTE — Assessment & Plan Note (Signed)
Pt advised to work on diet and exercise as tolerated  

## 2023-03-22 NOTE — Assessment & Plan Note (Deleted)
Ordered lipid panel, pending results. Work on low cholesterol diet and exercise as tolerated  

## 2023-03-22 NOTE — Assessment & Plan Note (Signed)
Trial buspirone 5 mg twice daily  Hesitant for SSRI due to on xarelto as increases bleeding risk  Declined therapy referral at this time.

## 2023-03-22 NOTE — Progress Notes (Signed)
Is pt taking any medication currently for diabetes?  The number is much too high, goal < 6.5 at 7.9. we need to get working on decreasing the numbers. Does pt want an injection such as ozempic? Or would he prefer pills? Has he ever had metformin?

## 2023-03-22 NOTE — Assessment & Plan Note (Signed)
Ordered hga1c today pending results. Work on diabetic diet and exercise as tolerated. Yearly foot exam, and annual eye exam.  Urine microalbumin today

## 2023-03-22 NOTE — Progress Notes (Signed)
Established Patient Office Visit  Subjective:      CC:  Chief Complaint  Patient presents with   Anxiety    Had some issues with increased anxiety yesterday while trying to get a lumbar injection.    HPI: Steve Burnett is a 62 y.o. male presenting on 03/22/2023 for Anxiety (Had some issues with increased anxiety yesterday while trying to get a lumbar injection.) . Lumbar herniated disc, still seeing neurosurgeon. Not having to use a walker anymore, still ongoing lumbar injections at current. Does also  see pain management for chronic radicular lumbar pain.   OSA: currently using a CPAP   HLD: admits to non compliance to rosuvastatin only taking a few times a week, 'i need to get better at this'   Anxiety: has tried wellbutrin in the past (increased anxiety) celexa (did cause him to feel jittery at the time but otherwise helped him) in the past and  Did go back to work back in April, recently promoted. 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 Occasional use of hydroxyxine is helpful however recent lumbar procedure had incidents at work that elevated his stress, he states he started sweating profusely and had a panic attack. He states his anxiety has been affecting his work and marriage as well. He is sleeping ok. He is not quick to anger without agitation.   HTN: today elevated at 148/88. Was also increased at last appt with cardiology , Dr. Maryruth Hancock. He had been out of his spironolactone at the time but has since replaced. Due for f/u in 9/24. Been a little high at home.   H/o MI, CAD, seeing cardiology Dr. Maryruth Hancock.   DM2: ozempic, but started to give him increasing nausea.  He has been checking his sugars at home, fasting 113.  Lab Results  Component Value Date   HGBA1C 7.9 (H) 10/04/2022   Wt Readings from Last 3 Encounters:  03/22/23 (!) 370 lb (167.8 kg)  03/19/23 (!) 360 lb (163.3 kg)   03/14/23 (!) 360 lb (163.3 kg)    Lab Results  Component Value Date   CHOL 141 04/08/2020   HDL 34.60 (L) 04/08/2020   LDLCALC 76 04/08/2020   LDLDIRECT 79.0 08/08/2016   TRIG 152.0 (H) 04/08/2020   CHOLHDL 4 04/08/2020       Social history:  Relevant past medical, surgical, family and social history reviewed and updated as indicated. Interim medical history since our last visit reviewed.  Allergies and medications reviewed and updated.  DATA REVIEWED: CHART IN EPIC     ROS: Negative unless specifically indicated above in HPI.    Current Outpatient Medications:    Accu-Chek Softclix Lancets lancets, 1 each 3 (three) times daily., Disp: , Rfl:    Blood Glucose Monitoring Suppl (ACCU-CHEK GUIDE) w/Device KIT, 1 EACH BY DOES NOT APPLY ROUTE IN THE MORNING, AT NOON, AND AT BEDTIME., Disp: , Rfl:    busPIRone (BUSPAR) 5 MG tablet, Take 1 tablet (5 mg total) by mouth 2 (two) times daily., Disp: 60 tablet, Rfl: 2   carvedilol (COREG) 25 MG tablet, Take 1 tablet (25 mg total) by mouth 2 (two) times daily with a meal., Disp: 180 tablet, Rfl: 3   clobetasol cream (TEMOVATE) 0.05 %, Apply 1 Application topically 2 (two) times daily., Disp: 30 g, Rfl: 0   clopidogrel (PLAVIX) 75 MG tablet, Take 1 tablet (75 mg total) by mouth daily., Disp:  90 tablet, Rfl: 3   hydrOXYzine (ATARAX) 25 MG tablet, Take 1 tablet (25 mg total) by mouth 3 (three) times daily as needed for anxiety., Disp: 30 tablet, Rfl: 0   lidocaine (LIDODERM) 5 %, Place 1 patch onto the skin every 12 (twelve) hours. Remove & Discard patch within 12 hours or as directed by MD, Disp: 10 patch, Rfl: 1   lisinopril (ZESTRIL) 40 MG tablet, Take 1 tablet (40 mg total) by mouth daily., Disp: 90 tablet, Rfl: 3   rosuvastatin (CRESTOR) 10 MG tablet, TAKE 1 TABLET BY MOUTH EVERY DAY AT 6PM, Disp: 90 tablet, Rfl: 3   spironolactone (ALDACTONE) 25 MG tablet, Take 1 tablet (25 mg total) by mouth daily., Disp: 90 tablet, Rfl: 3       Objective:    BP (!) 150/105   Pulse 60   Temp 97.6 F (36.4 C) (Oral)   Ht 5\' 10"  (1.778 m)   Wt (!) 370 lb (167.8 kg)   SpO2 98%   BMI 53.09 kg/m   Wt Readings from Last 3 Encounters:  03/22/23 (!) 370 lb (167.8 kg)  03/19/23 (!) 360 lb (163.3 kg)  03/14/23 (!) 360 lb (163.3 kg)    Physical Exam Constitutional:      General: He is not in acute distress.    Appearance: Normal appearance. He is normal weight. He is not ill-appearing, toxic-appearing or diaphoretic.  Cardiovascular:     Rate and Rhythm: Normal rate and regular rhythm.  Pulmonary:     Effort: Pulmonary effort is normal.     Breath sounds: Normal breath sounds.  Musculoskeletal:        General: Normal range of motion.  Neurological:     General: No focal deficit present.     Mental Status: He is alert and oriented to person, place, and time. Mental status is at baseline.  Psychiatric:        Mood and Affect: Mood normal.        Behavior: Behavior normal.        Thought Content: Thought content normal.        Judgment: Judgment normal.     Lab Results  Component Value Date   HGBA1C 7.9 (H) 10/04/2022         Assessment & Plan:  Essential hypertension Assessment & Plan: Uncontrolled.  Consideration for increase carvedilol to 6.25 bid however awaiting cardio recommendation  Cont lisinopril 40 mg once daily  Hydrochlorothiazide causes him to 'feel dry'    Orders: -     Comprehensive metabolic panel  Type 2 diabetes mellitus with hyperglycemia, without long-term current use of insulin (HCC) Assessment & Plan: Ordered hga1c today pending results. Work on diabetic diet and exercise as tolerated. Yearly foot exam, and annual eye exam.  Urine microalbumin today   Orders: -     Microalbumin / creatinine urine ratio -     Comprehensive metabolic panel -     Hemoglobin A1c; Future  GAD (generalized anxiety disorder) -     busPIRone HCl; Take 1 tablet (5 mg total) by mouth 2 (two) times daily.   Dispense: 60 tablet; Refill: 2  Elevated red blood cell count -     CBC with Differential/Platelet  Hypomagnesemia -     Magnesium  Morbid obesity (HCC) Assessment & Plan: Pt advised to work on diet and exercise as tolerated    Hyperlipidemia associated with type 2 diabetes mellitus (HCC) Assessment & Plan: Ordered lipid panel, pending results. Work on  low cholesterol diet and exercise as tolerated   Orders: -     Lipid panel  Mixed hyperlipidemia  Anxiety Assessment & Plan: Trial buspirone 5 mg twice daily  Hesitant for SSRI due to on xarelto as increases bleeding risk  Declined therapy referral at this time.        Return in about 3 months (around 06/22/2023) for f/u diabetes.  Mort Sawyers, MSN, APRN, FNP-C [facility]

## 2023-03-22 NOTE — Assessment & Plan Note (Signed)
Uncontrolled.  Consideration for increase carvedilol to 6.25 bid however awaiting cardio recommendation  Cont lisinopril 40 mg once daily  Hydrochlorothiazide causes him to 'feel dry'

## 2023-03-22 NOTE — Patient Instructions (Signed)
Start buspirone 5 mg twice daily.    Regards,   Mort Sawyers FNP-C

## 2023-03-22 NOTE — Assessment & Plan Note (Signed)
Ordered lipid panel, pending results. Work on low cholesterol diet and exercise as tolerated  

## 2023-03-27 ENCOUNTER — Encounter: Payer: Self-pay | Admitting: Family

## 2023-03-27 ENCOUNTER — Other Ambulatory Visit: Payer: Self-pay | Admitting: Family

## 2023-03-27 DIAGNOSIS — E1165 Type 2 diabetes mellitus with hyperglycemia: Secondary | ICD-10-CM

## 2023-03-27 DIAGNOSIS — I10 Essential (primary) hypertension: Secondary | ICD-10-CM

## 2023-03-27 MED ORDER — METFORMIN HCL 500 MG PO TABS
500.0000 mg | ORAL_TABLET | Freq: Two times a day (BID) | ORAL | 3 refills | Status: DC
Start: 2023-03-27 — End: 2024-03-24

## 2023-03-27 MED ORDER — AMLODIPINE BESYLATE 5 MG PO TABS
5.0000 mg | ORAL_TABLET | Freq: Every day | ORAL | 3 refills | Status: DC
Start: 2023-03-27 — End: 2024-03-31

## 2023-03-27 NOTE — Progress Notes (Signed)
I just advised pt this am to start new blood pressure medication. We will start a medication called metformin for his diabetes as well, but have him start about 3 days after starting blood pressure medication. He will start metformin once daily for about one week then increase to twice daily after that as long as tolerating well (as it can cause GI upset such as diarrhea)

## 2023-04-16 ENCOUNTER — Ambulatory Visit: Payer: 59 | Admitting: Student in an Organized Health Care Education/Training Program

## 2023-04-18 ENCOUNTER — Encounter: Payer: Self-pay | Admitting: Student in an Organized Health Care Education/Training Program

## 2023-04-18 ENCOUNTER — Ambulatory Visit
Admission: RE | Admit: 2023-04-18 | Discharge: 2023-04-18 | Disposition: A | Discharge status: Home / Self Care | Payer: 59 | Source: Ambulatory Visit | Attending: Student in an Organized Health Care Education/Training Program | Admitting: Student in an Organized Health Care Education/Training Program

## 2023-04-18 ENCOUNTER — Ambulatory Visit: Payer: 59 | Admitting: Student in an Organized Health Care Education/Training Program

## 2023-04-18 VITALS — BP 123/80 | HR 75 | Temp 97.3°F | Resp 20 | Ht 70.0 in | Wt 360.0 lb

## 2023-04-18 DIAGNOSIS — M5116 Intervertebral disc disorders with radiculopathy, lumbar region: Secondary | ICD-10-CM | POA: Diagnosis not present

## 2023-04-18 DIAGNOSIS — M5412 Radiculopathy, cervical region: Secondary | ICD-10-CM

## 2023-04-18 DIAGNOSIS — M48062 Spinal stenosis, lumbar region with neurogenic claudication: Secondary | ICD-10-CM | POA: Insufficient documentation

## 2023-04-18 DIAGNOSIS — M5416 Radiculopathy, lumbar region: Secondary | ICD-10-CM | POA: Insufficient documentation

## 2023-04-18 DIAGNOSIS — G8929 Other chronic pain: Secondary | ICD-10-CM | POA: Diagnosis present

## 2023-04-18 DIAGNOSIS — M25511 Pain in right shoulder: Secondary | ICD-10-CM | POA: Insufficient documentation

## 2023-04-18 NOTE — Progress Notes (Signed)
PROVIDER NOTE: Information contained herein reflects review and annotations entered in association with encounter. Interpretation of such information and data should be left to medically-trained personnel. Information provided to patient can be located elsewhere in the medical record under "Patient Instructions". Document created using STT-dictation technology, any transcriptional errors that may result from process are unintentional.    Patient: Steve Burnett  Service Category: E/M  Provider: Edward Jolly, MD  DOB: 1960-12-21  DOS: 04/18/2023  Referring Provider: Mort Sawyers, FNP  MRN: [id number]  Specialty: Interventional Pain Management  PCP: Mort Sawyers, FNP  Type: Established Patient  Setting: Ambulatory outpatient    Location: Office  Delivery: Face-to-face     HPI  [name], a 62 y.o. year old male, is here today because of his Chronic radicular lumbar pain [M54.16, G89.29]. Mr. Izzard primary complain today is Back Pain and Neck Pain   Pain Assessment: Severity of Chronic pain is reported as a 1 /10. Location: Back Lower/denies. Onset: More than a month ago. Quality: Discomfort. Timing: Intermittent. Modifying factor(s): resting. Vitals:  height is 5\' 10"  (1.778 m) and weight is 360 lb (163.3 kg) (abnormal). His temperature is 97.3 F (36.3 C) (abnormal). His blood pressure is 123/80 and his pulse is 75. His respiration is 20 and oxygen saturation is 96%.  BMI: Estimated body mass index is 51.65 kg/m as calculated from the following:   Height as of this encounter: 5\' 10"  (1.778 m).   Weight as of this encounter: 360 lb (163.3 kg). Last encounter: 03/06/2023. Last procedure: 03/19/2023.  Reason for encounter: post-procedure evaluation and assessment.    Post-procedure evaluation   Procedure: Lumbar epidural steroid injection (LESI) (interlaminar) #2    Laterality: Left   Level:  L2-3 Level.  Imaging: Fluoroscopic guidance         Anesthesia: Local anesthesia (1-2%  Lidocaine) Sedation: IV Versed DOS: 03/19/2023  Performed by: Edward Jolly, MD  Purpose: Diagnostic/Therapeutic Indications: Lumbar radicular pain of intraspinal etiology of more than 4 weeks that has failed to respond to conservative therapy and is severe enough to impact quality of life or function. 1. Chronic radicular lumbar pain   2. Lumbar disc herniation with radiculopathy (left)   3. 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  He endorses tightness and pain along his trapezius.  He tries to perform deep palpation and massage with does seem to help.  Recommend x-rays of cervical spine and right shoulder.  ROS  Constitutional: Denies any fever or chills Gastrointestinal: No reported hemesis, hematochezia, vomiting, or acute GI distress Musculoskeletal:  Cervical spine and right shoulder pain Neurological: No reported episodes of acute onset apraxia, aphasia, dysarthria, agnosia, amnesia, paralysis, loss of coordination, or loss of consciousness  Medication Review  Accu-Chek Guide, Accu-Chek Softclix Lancets, amLODipine, busPIRone, carvedilol, clobetasol cream, clopidogrel, hydrOXYzine, lidocaine, lisinopril, metFORMIN, rosuvastatin, and spironolactone  History Review  Allergy: [name] is allergic to flomax [tamsulosin], atorvastatin, crestor [rosuvastatin calcium], and wellbutrin [bupropion]. Drug: [name]  reports that he does not currently use drugs after having used the  following drugs: Marijuana. Alcohol:  reports  current alcohol use of about 1.0 standard drink of alcohol per week. Tobacco:  reports that he has never smoked. He has never used smokeless tobacco. Social: Mr. Bent  reports that he has never smoked. He has never used smokeless tobacco. He reports current alcohol use of about 1.0 standard drink of alcohol per week. He reports that he does not currently use drugs after having used the following drugs: Marijuana. Medical:  has a past medical history of Anxiety, Chicken pox, Chronic combined systolic (congestive) and diastolic (congestive) heart failure (HCC), Coronary artery disease, Dilated aortic root (HCC), Hyperlipidemia, Hypertension, Ischemic cardiomyopathy, Obesity, OSA (obstructive sleep apnea) (03/27/2014), PAF (paroxysmal atrial fibrillation) (HCC), PVC's (premature ventricular contractions), and VF (ventricular fibrillation) (HCC). Surgical: Mr. Laible  has a past surgical history that includes Cardiac catheterization (03/12/2012); Coronary angioplasty (03/12/2012); Coronary/Graft Acute MI Revascularization (N/A, 01/23/2018); LEFT HEART CATH AND CORONARY ANGIOGRAPHY (N/A, 01/23/2018); and IR INJECT DIAG/THERA/INC NEEDLE/CATH/PLC EPI/LUMB/SAC W/IMG (10/06/2022). Family: family history includes Heart attack (age of onset: 3) in his mother; Heart disease in his mother; Heart disease (age of onset: 52) in his sister; Hyperlipidemia in his mother; Hypertension in his mother; Stroke in his father.  Laboratory Chemistry Profile   Renal Lab Results  Component Value Date   BUN 21 03/22/2023   CREATININE 0.86 03/22/2023   BCR 17 01/29/2020   GFR 94.56 03/22/2023   GFRAA 111 01/29/2020   GFRNONAA >60 10/10/2022    Hepatic Lab Results  Component Value Date   AST 14 03/22/2023   ALT 17 03/22/2023   ALBUMIN 3.8 03/22/2023   ALKPHOS 59 03/22/2023   LIPASE 142 04/30/2012    Electrolytes Lab Results  Component Value Date   NA 134 (L) 03/22/2023   K 4.0 03/22/2023   CL 99 03/22/2023   CALCIUM  9.4 03/22/2023   MG 1.8 03/22/2023   PHOS 3.3 10/08/2022    Bone No results found for: "VD25OH", "VD125OH2TOT", "HQ4696EX5", "MW4132GM0", "25OHVITD1", "25OHVITD2", "25OHVITD3", "TESTOFREE", "TESTOSTERONE"  Inflammation (CRP: Acute Phase) (ESR: Chronic Phase) No results found for: "CRP", "ESRSEDRATE", "LATICACIDVEN"       Note: Above Lab results reviewed.   Physical Exam  General appearance: Well nourished, well developed, and well hydrated. In no apparent acute distress Mental status: Alert, oriented x 3 (person, place, & time)       Respiratory: No evidence of acute respiratory distress Eyes: PERLA Vitals: BP 123/80   Pulse 75   Temp (!) 97.3 F (36.3 C)   Resp 20   Ht 5\' 10"  (1.778 m)   Wt (!) 360 lb (163.3 kg)   SpO2 96%   BMI 51.65 kg/m  BMI: Estimated body mass index is 51.65 kg/m as calculated from the following:   Height as of this encounter: 5\' 10"  (1.778 m).   Weight as of this encounter: 360 lb (163.3 kg). Ideal: Ideal body weight: 73 kg (160 lb 15 oz) Adjusted ideal body weight: 109.1 kg (240 lb 9 oz)  Cervical spine pain, tender to palpation along the right C5-C6 region.  Tightness of right trapezius.  Full range of motion.  5 out of 5 strength bilateral upper extremity: Shoulder abduction, elbow flexion, elbow extension, thumb extension.  Improvement in low back pain and left leg pain  5 out of 5 strength bilateral lower extremity: Plantar flexion, dorsiflexion, knee flexion, knee extension.   Assessment   Diagnosis Status  1. Chronic radicular lumbar pain   2. Lumbar disc herniation with  radiculopathy (left)   3. 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 Cervical radicular pain   5. Chronic right shoulder pain    Controlled Controlled Controlled   Updated Problems: Problem  Cervical Radicular Pain  Chronic Right Shoulder Pain    Plan of Care  Repeat LESI as needed.  Workup of cervical spine and shoulder pain as below.   Patient may benefit from trigger point injections as he does have myofascial pain and tenderness to palpation with tight trapezius muscle noted.   Orders:  Orders Placed This Encounter  Procedures   DG Cervical Spine Complete    Patient presents with axial pain with possible radicular component. Please assist [place] in identifying specific level(s) and laterality of any additional findings such as: 1. Facet (Zygapophyseal) joint DJD (Hypertrophy, space narrowing, subchondral sclerosis, and/or osteophyte formation) 2. DDD and/or IVDD (Loss of disc height, desiccation, gas patterns, osteophytes, endplate sclerosis, or "Black disc disease") 3. Pars defects 4. Spondylolisthesis, spondylosis, and/or spondyloarthropathies (include Degree/Grade of displacement in mm) (stability) 5. Vertebral body Fractures (acute/chronic) (state percentage of collapse) 6. Demineralization (osteopenia/osteoporotic) 7. Bone pathology 8. Foraminal narrowing  9. Surgical changes    Standing Status:   Future    Number of Occurrences:   1    Standing Expiration Date:   07/18/2023    Scheduling Instructions:     Please make sure that the patient understands that this needs to be done as soon as possible. Never have the patient do the imaging "just before the next appointment". Inform patient that having the imaging done within the [facility] will expedite the availability of the results and will provide      imaging availability to the requesting physician. In addition inform the patient that the imaging order has an expiration date and will not be renewed if not done within the active period.    Order Specific Question:   Reason for Exam (SYMPTOM  OR DIAGNOSIS REQUIRED)    Answer:   Cervicalgia    Order Specific Question:   Preferred imaging location?    Answer:   [facility]    Order Specific Question:   Call Results- Best Contact Number?    Answer:   [phone]   DG Shoulder Right    Standing Status:    Future    Number of Occurrences:   1    Standing Expiration Date:   04/17/2024    Order Specific Question:   Reason for Exam (SYMPTOM  OR DIAGNOSIS REQUIRED)    Answer:   right shoulder pain    Order Specific Question:   Preferred imaging location?    Answer:   [facility]   Follow-up plan:   Return for i will call patient with results.      Recent Visits Date Type Provider Dept  03/19/23 Procedure visit Edward Jolly, MD [facility]  03/14/23 Procedure visit Edward Jolly, MD [facility]  03/06/23 Office Visit Edward Jolly, MD [facility]  Showing recent visits within past 90 days and meeting all other requirements Today's Visits Date Type Provider Dept  04/18/23 Office Visit Edward Jolly, MD [facility]  Showing today's visits and meeting all other requirements Future Appointments No visits were found meeting these conditions. Showing future appointments within next 90 days and meeting all other requirements  I discussed the assessment and treatment plan with the patient. The patient was provided an opportunity to ask questions and all were answered. The patient  agreed with the plan and demonstrated an understanding of the instructions.  Patient advised to call back or seek an in-person evaluation if the symptoms or condition worsens.  Duration of encounter: .  Total time on encounter, as per AMA guidelines included both the face-to-face and non-face-to-face time personally spent by the physician and/or other qualified health care professional(s) on the day of the encounter (includes time in activities that require the physician or other qualified health care professional and does not include time in activities normally performed by clinical staff). Physician's time may include the following activities when performed: Preparing to see the patient (e.g., pre-charting review of records, searching for previously ordered imaging,  lab work, and nerve conduction tests) Review of prior analgesic pharmacotherapies. Reviewing PMP Interpreting ordered tests (e.g., lab work, imaging, nerve conduction tests) Performing post-procedure evaluations, including interpretation of diagnostic procedures Obtaining and/or reviewing separately obtained history Performing a medically appropriate examination and/or evaluation Counseling and educating the patient/family/caregiver Ordering medications, tests, or procedures Referring and communicating with other health care professionals (when not separately reported) Documenting clinical information in the electronic or other health record Independently interpreting results (not separately reported) and communicating results to the patient/ family/caregiver Care coordination (not separately reported)  Note by: Edward Jolly, MD Date: 04/18/2023; Time: 3:35 PM

## 2023-04-18 NOTE — Progress Notes (Signed)
Safety precautions to be maintained throughout the outpatient stay will include: orient to surroundings, keep bed in low position, maintain call bell within reach at all times, provide assistance with transfer out of bed and ambulation.  

## 2023-04-26 ENCOUNTER — Ambulatory Visit: Payer: 59 | Attending: Nurse Practitioner | Admitting: Nurse Practitioner

## 2023-04-26 ENCOUNTER — Encounter: Payer: Self-pay | Admitting: Nurse Practitioner

## 2023-04-26 VITALS — BP 115/81 | HR 68 | Ht 70.0 in | Wt 370.4 lb

## 2023-04-26 DIAGNOSIS — I255 Ischemic cardiomyopathy: Secondary | ICD-10-CM

## 2023-04-26 DIAGNOSIS — I10 Essential (primary) hypertension: Secondary | ICD-10-CM | POA: Diagnosis not present

## 2023-04-26 DIAGNOSIS — E785 Hyperlipidemia, unspecified: Secondary | ICD-10-CM

## 2023-04-26 DIAGNOSIS — G4733 Obstructive sleep apnea (adult) (pediatric): Secondary | ICD-10-CM

## 2023-04-26 DIAGNOSIS — I2510 Atherosclerotic heart disease of native coronary artery without angina pectoris: Secondary | ICD-10-CM | POA: Diagnosis not present

## 2023-04-26 DIAGNOSIS — I4729 Other ventricular tachycardia: Secondary | ICD-10-CM | POA: Diagnosis not present

## 2023-04-26 DIAGNOSIS — E1165 Type 2 diabetes mellitus with hyperglycemia: Secondary | ICD-10-CM

## 2023-04-26 DIAGNOSIS — I5022 Chronic systolic (congestive) heart failure: Secondary | ICD-10-CM

## 2023-04-26 MED ORDER — ROSUVASTATIN CALCIUM 10 MG PO TABS: ORAL_TABLET | ORAL | Status: AC

## 2023-04-26 NOTE — Progress Notes (Signed)
Office Visit    Patient Name: Steve Burnett Date of Encounter: 04/26/2023  Primary Care Provider:  Mort Sawyers, FNP Primary Cardiologist:  Lorine Bears, MD  Chief Complaint    62 y.o. male with a history of CAD status post non-STEMI in 2013, and inferior STEMI in 2019, hypertension, hyperlipidemia, ischemic cardiomyopathy, palpitations, PVCs, HFmrEF, obesity, and sleep apnea, who presents for follow-up of CAD and heart failure.   Past Medical History    Past Medical History:  Diagnosis Date   Anxiety    Chicken pox    Chronic combined systolic (congestive) and diastolic (congestive) heart failure (HCC)    a. 01/2018 Echo: EF 35-40%; b. 04/2018 Echo: EF 45-50%, diff HK, Gr2 DD; c. 02/2020 Echo: EF 45-50%, glob HK, Gr1 DD.   Coronary artery disease    a. 02/2012 NSTEMI/Cath/PCI: pLAD 60%, mLAD 80%, dLAD 70, nonobs LCX/RCA --> DES to prox/mid LAD; b. inf STEMI 01/23/18: ost/proxLAD 75% at the prox edge of the previously placed stent, mLAD 75%, OM1 50%, OM2 50%, pRCA 100% s/p PCI/DES, rPLA 70%; c. 02/2018 MV: Ef 44%, apical inferior infarct. No ischemia.   Dilated aortic root (HCC)    a. 02/2020 Echo: Ao root 3.9cm.   Hyperlipidemia    Hypertension    Ischemic cardiomyopathy    a. echo 7/19: EF 35-40%, HK of the inf wall, Gr1DD; b. 04/2018 Echo: EF 45-50%, diff HK, Gr2 DD, mildly dil Ao root/Asc Al. Mild MR. Mild BAE. Nl RV fxn; c. 02/2020 Echo: EF 45-50%, glob HK, Gr1 DD. RVSP ~ 35.8mmHg. Ao root 3.9cm.   Obesity    OSA (obstructive sleep apnea) 03/27/2014   PAF (paroxysmal atrial fibrillation) (HCC)    a. noted during [facility] 01/23/18 during inferior STEMI; b. resolved with IV amiodarone; c. not placed on anticoagulation given brief episode, acute illness, and need for DAPT   PVC's (premature ventricular contractions)    a. 02/2018 Holter: Frq PVC's. No afib. Beta blocker titrated.   VF (ventricular fibrillation) (HCC)    a. noted during [facility] 01/23/2018 s/p defibrillation x 2   Past  Surgical History:  Procedure Laterality Date   CARDIAC CATHETERIZATION  03/12/2012   CORONARY ANGIOPLASTY  03/12/2012   s/p drug eluting stent to the mid LAD : 3.5 x 38 mm Xience drug-eluting stent. Post dilated to 4.0 in mid segment and 4.5 proximally.   CORONARY/GRAFT ACUTE MI REVASCULARIZATION N/A 01/23/2018   Procedure: Coronary/Graft Acute MI Revascularization;  Surgeon: Marcina Millard, MD;  Location: [facility] INVASIVE CV LAB;  Service: Cardiovascular;  Laterality: N/A;   IR INJECT/THERA/INC NEEDLE/CATH/PLC EPI/LUMB/SAC W/IMG  10/06/2022   LEFT HEART CATH AND CORONARY ANGIOGRAPHY N/A 01/23/2018   Procedure: LEFT HEART CATH AND CORONARY ANGIOGRAPHY;  Surgeon: Marcina Millard, MD;  Location: [facility] INVASIVE CV LAB;  Service: Cardiovascular;  Laterality: N/A;    Allergies  Allergies  Allergen Reactions   Flomax [Tamsulosin] Nausea Only    Upset, lightheaded   Atorvastatin Other (See Comments)    Muscle cramps    Crestor [Rosuvastatin Calcium] Other (See Comments)    Cramps   Wellbutrin [Bupropion] Other (See Comments)    Increased anxiety     History of Present Illness      62 y.o. y/o male with the above past medical history including CAD, hypertension, hyperlipidemia, ischemic cardiomyopathy, palpitations, PVCs, nonsustained VT, HFmrEF, obesity, and sleep apnea. In August 2013, he suffered a non-STEMI in the setting of severe mid LAD disease requiring drug-eluting stent placement. In July 2019,  he suffered an inferior STEMI and was found to have an occluded right coronary artery and underwent drug-eluting stent placement. This was complicated by VF arrest x2. He also had a brief episode of paroxysmal atrial fibrillation during catheterization and was briefly treated with amiodarone. Catheterization at the time of inferior STEMI also showed moderate in-stent restenosis in the LAD, which has been medically managed. Echocardiogram during admission showed an EF of 35 to 40% with inferior  hypokinesis and grade 1 diastolic dysfunction. A Lexiscan Myoview was performed as an outpatient to evaluate ischemic burden of his LAD in August 2019, and this showed prior inferior infarct without ischemia. He was medically managed. In August 2019, he complained of palpitations and underwent event monitoring which showed frequent PVCs resulting in titration of beta-blocker. Follow-up echocardiogram in October 2019 showed improvement in LV function with an EF of 45 to 50%, and diffuse hypokinesis. In July 2021, he reported episodic tachypalpitations, lasting up to 20 minutes. A Zio monitor was placed and showed 4 runs of nonsustained VT, the longest of which lasted 16 seconds with an average heart rate of 142 bpm. He also had 2 runs of SVT, isolated PACs, and PVCs. Echocardiogram was performed in August 2021 showing an EF of 45 to 50% with global hypokinesis, grade 1 diastolic dysfunction, RVSP of approximately 35.6 mmHg, and an aortic root at 3.9 cm. Continued medical therapy was recommended.    Mr. Eppinger was last seen in cardiology clinic in March 2024, at which time he was doing well from a cardiac standpoint.  Blood pressure was elevated and spironolactone was resumed (had run out).  More recently, amlodipine was added to his regimen due to hypertension at a primary care follow-up visit.  Unfortunately, he was admitted to the hospital about a week later with severe back pain and finding of lumbar disc extrusion and severe spinal stenosis.  He subsequently underwent epidural steroid injection with plan for laminectomy.  He has since seen neurosurgery and has been managed with physical therapy.  He has been encouraged to lose weight however, weight is up 10 pounds today compared to his April neurosurgery visit.  From a cardiac standpoint, he has done well though notes that his activity is limited as he continues to walk with a cane at times.  He denies chest pain, dyspnea, palpitations, PND, orthopnea,  dizziness, syncope, edema, or early satiety.  Blood pressures have been better on amlodipine since starting it, he sometimes feels little bit tired.  He is compliant with his CPAP at night.  Home Medications    Current Outpatient Medications  Medication Sig Dispense Refill   Accu-Chek Softclix Lancets lancets 1 each 3 (three) times daily.     amLODipine (NORVASC) 5 MG tablet Take 1 tablet (5 mg total) by mouth daily. 90 tablet 3   Blood Glucose Monitoring Suppl (ACCU-CHEK GUIDE) w/Device KIT 1 EACH BY DOES NOT APPLY ROUTE IN THE MORNING, AT NOON, AND AT BEDTIME.     busPIRone (BUSPAR) 5 MG tablet Take 1 tablet (5 mg total) by mouth 2 (two) times daily. 60 tablet 2   carvedilol (COREG) 25 MG tablet Take 1 tablet (25 mg total) by mouth 2 (two) times daily with a meal. 180 tablet 3   clobetasol cream (TEMOVATE) 0.05 % Apply 1 Application topically 2 (two) times daily. 30 g 0   clopidogrel (PLAVIX) 75 MG tablet Take 1 tablet (75 mg total) by mouth daily. 90 tablet 3   hydrOXYzine (ATARAX) 25 MG tablet  Take 1 tablet (25 mg total) by mouth 3 (three) times daily as needed for anxiety. 30 tablet 0   lidocaine (LIDODERM) 5 % Place 1 patch onto the skin every 12 (twelve) hours. Remove & Discard patch within 12 hours or as directed by MD 10 patch 1   lisinopril (ZESTRIL) 40 MG tablet Take 1 tablet (40 mg total) by mouth daily. 90 tablet 3   metFORMIN (GLUCOPHAGE) 500 MG tablet Take 1 tablet (500 mg total) by mouth 2 (two) times daily with a meal. 180 tablet 3   rosuvastatin (CRESTOR) 10 MG tablet TAKE 1 TABLET BY MOUTH EVERY DAY     spironolactone (ALDACTONE) 25 MG tablet Take 1 tablet (25 mg total) by mouth daily. 90 tablet 3   No current facility-administered medications for this visit.     Review of Systems    Activity limited by back pain.  He denies chest pain, dyspnea, palpitations, PND, orthopnea, dizziness, syncope, edema, or early satiety.  Has had some fatigue and tiredness since starting  amlodipine.  All other systems reviewed and are otherwise negative except as noted above.    Physical Exam    VS:  BP 115/81 (BP Location: Left Arm, Patient Position: Sitting, Cuff Size: Large)   Pulse 68   Ht 5\' 10"  (1.778 m)   Wt (!) 370 lb 6.4 oz (168 kg)   SpO2 92%   BMI 53.15 kg/m  , BMI Body mass index is 53.15 kg/m.     GEN: Obese, in no acute distress. HEENT: normal. Neck: Supple, obese, difficult to gauge JVP.  No bruits or masses.   Cardiac: RRR, no murmurs, rubs, or gallops. No clubbing, cyanosis, edema.  Radials 2+/PT 2+ and equal bilaterally.  Respiratory:  Respirations regular and unlabored, clear to auscultation bilaterally. GI: Obese, protuberant, nontender, BS + x 4. MS: no deformity or atrophy. Skin: warm and dry, no rash. Neuro:  Strength and sensation are intact. Psych: Normal affect.  Accessory Clinical Findings    ECG personally reviewed by me today - EKG Interpretation Date/Time:  Thursday April 26 2023 11:20:23 EDT Ventricular Rate:  66 PR Interval:  162 QRS Duration:  92 QT Interval:  376 QTC Calculation: 394 R Axis:   22  Text Interpretation: Normal sinus rhythm Low voltage QRS Septal infarct , age undetermined Confirmed by Nicolasa Ducking [phone]) on 04/26/2023 11:53:17 AM  - no acute changes.  Lab Results  Component Value Date   WBC 8.2 03/22/2023   HGB 14.0 03/22/2023   HCT 44.0 03/22/2023   MCV 78.9 03/22/2023   PLT 316.0 03/22/2023   Lab Results  Component Value Date   CREATININE 0.86 03/22/2023   BUN 21 03/22/2023   NA 134 (L) 03/22/2023   K 4.0 03/22/2023   CL 99 03/22/2023   CO2 27 03/22/2023   Lab Results  Component Value Date   ALT 17 03/22/2023   AST 14 03/22/2023   ALKPHOS 59 03/22/2023   BILITOT 0.5 03/22/2023   Lab Results  Component Value Date   CHOL 175 03/22/2023   HDL 37.50 (L) 03/22/2023   LDLCALC 76 04/08/2020   LDLDIRECT 129.0 03/22/2023   TRIG 259.0 (H) 03/22/2023   CHOLHDL 5 03/22/2023    Lab  Results  Component Value Date   HGBA1C 7.8 (H) 03/22/2023    Assessment & Plan    1.  Coronary artery disease: Status post prior LAD and RCA PCI.  Known residual in-stent restenosis of the LAD but not have  anterior ischemia on stress testing in 2019, and has been doing well without chest pain or progression of dyspnea on exertion.  Activity is limited in the setting of chronic back pain and weight is increased 10 pounds in the past 6 months.  He remains on beta-blocker, Plavix, ACE inhibitor, and statin therapy.  He notes that he often misses his statin dose in the PM, so will start taking it w/ his morning meds.  2.  Essential HTN: Blood pressure improved since starting amlodipine.  He is also on max dose carvedilol, lisinopril, and 25 mg of spironolactone daily.  He has felt a little more fatigued since starting amlodipine but is not sure if it is the medication or his ongoing pain and relative inactivity contributing.  3.  Hyperlipidemia: LDL was 129 in August.  In discussing this with him today, he notes that he frequently skips his rosuvastatin dose as he was taking it at the end of the workday and just chooses to miss it.  He will start taking it in the morning.  He does have a history of cramping on statins and I advised that if he develops cramping or myalgias with taking rosuvastatin daily, he should notify us immediately, at which time we can hold and switch to ezetimibe and/or PCSK9 inhibitor.  4.  Ischemic cardiomyopathy/chronic heart failure with midrange ejection fraction: EF 45 to 50% by echo in August 2021.  Overall stable and euvolemic on examination.  He remains on beta-blocker, ACE inhibitor, and spironolactone.  Stable renal function electrolytes in August.  He had previously tried Ozempic but this caused nausea and forced..  5.  Nonsustained VT/PVCs: Quiescent.  Continue beta-blocker.  6.  Obstructive sleep apnea: Reports compliance.  Still has some daytime somnolence and will  follow-up with sleep medicine provider.  7.  History of atrial fibrillation: This occurred at the time of his myocardial infarction 2019 was short-lived.  No known recurrence of A-fib and he is not on anticoagulation.  Continue beta-blocker.  8.  Morbid obesity: 370 pounds today.  He is relatively inactive in the setting of chronic back pain.  He tried Ozempic in the past however this caused nausea and he subsequently discontinued.  Difficult situation.  Would greatly benefit from dietitian counseling though at this time, he is not particular motivated to lose weight.  9.  Disposition: Follow-up in 6 months or sooner if necessary.   Nicolasa Ducking, NP 04/26/2023, 1:09 PM

## 2023-04-26 NOTE — Patient Instructions (Signed)
Medication Instructions:  Your physician recommends that you continue on your current medications as directed. Please refer to the Current Medication list given to you today.  *If you need a refill on your cardiac medications before your next appointment, please call your pharmacy*  Lab Work: -None ordered  Testing/Procedures: -None ordered  Follow-Up: At [facility], you and your health needs are our priority.  As part of our continuing mission to provide you with exceptional heart care, we have created designated Provider Care Teams.  These Care Teams include your primary Cardiologist (physician) and Advanced Practice Providers (APPs -  Physician Assistants and Nurse Practitioners) who all work together to provide you with the care you need, when you need it.  Your next appointment:   6 month(s)  Provider:   You may see Lorine Bears, MD or one of the following Advanced Practice Providers on your designated Care Team:   Nicolasa Ducking, NP Eula Listen, PA-C Cadence Fransico Michael, PA-C Charlsie Quest, NP    Other Instructions -None

## 2023-06-24 ENCOUNTER — Other Ambulatory Visit: Payer: Self-pay | Admitting: Family

## 2023-06-24 DIAGNOSIS — F411 Generalized anxiety disorder: Secondary | ICD-10-CM

## 2023-06-25 ENCOUNTER — Encounter: Payer: Self-pay | Admitting: Family

## 2023-06-25 DIAGNOSIS — F419 Anxiety disorder, unspecified: Secondary | ICD-10-CM

## 2023-06-26 NOTE — Telephone Encounter (Signed)
Can we ask pt how he is doing on buspirone?  Attempted mychart message, no response and he is requesting a refill.

## 2023-06-27 MED ORDER — HYDROXYZINE HCL 25 MG PO TABS: 25.0000 mg | ORAL_TABLET | Freq: Three times a day (TID) | ORAL | 0 refills | Status: DC | PRN

## 2023-06-27 NOTE — Telephone Encounter (Signed)
Pt responded to your MyChart message.

## 2023-08-28 ENCOUNTER — Other Ambulatory Visit: Payer: Self-pay | Admitting: Family

## 2023-08-28 DIAGNOSIS — F419 Anxiety disorder, unspecified: Secondary | ICD-10-CM

## 2023-08-29 MED ORDER — HYDROXYZINE HCL 25 MG PO TABS: 25.0000 mg | ORAL_TABLET | Freq: Three times a day (TID) | ORAL | 0 refills | Status: AC | PRN

## 2023-09-22 ENCOUNTER — Other Ambulatory Visit: Payer: Self-pay | Admitting: Family

## 2023-09-22 DIAGNOSIS — F411 Generalized anxiety disorder: Secondary | ICD-10-CM

## 2023-09-24 NOTE — Telephone Encounter (Signed)
 Pt needs to be seen within the next 30 days for future refills  We haven't follow up on diabetes A1c.

## 2023-09-24 NOTE — Telephone Encounter (Signed)
 Lvmtcb, sent mychart message

## 2023-09-26 ENCOUNTER — Other Ambulatory Visit: Payer: Self-pay | Admitting: Cardiovascular Disease

## 2023-10-18 ENCOUNTER — Encounter: Payer: Self-pay | Admitting: Nurse Practitioner

## 2023-10-18 ENCOUNTER — Ambulatory Visit: Payer: 59 | Attending: Nurse Practitioner | Admitting: Nurse Practitioner

## 2023-10-18 VITALS — BP 122/76 | HR 67 | Ht 70.0 in | Wt 367.0 lb

## 2023-10-18 DIAGNOSIS — I10 Essential (primary) hypertension: Secondary | ICD-10-CM

## 2023-10-18 DIAGNOSIS — I5022 Chronic systolic (congestive) heart failure: Secondary | ICD-10-CM | POA: Diagnosis not present

## 2023-10-18 DIAGNOSIS — I2510 Atherosclerotic heart disease of native coronary artery without angina pectoris: Secondary | ICD-10-CM | POA: Diagnosis not present

## 2023-10-18 DIAGNOSIS — G4733 Obstructive sleep apnea (adult) (pediatric): Secondary | ICD-10-CM

## 2023-10-18 DIAGNOSIS — I4729 Other ventricular tachycardia: Secondary | ICD-10-CM

## 2023-10-18 DIAGNOSIS — I255 Ischemic cardiomyopathy: Secondary | ICD-10-CM

## 2023-10-18 DIAGNOSIS — E785 Hyperlipidemia, unspecified: Secondary | ICD-10-CM

## 2023-10-18 DIAGNOSIS — E1165 Type 2 diabetes mellitus with hyperglycemia: Secondary | ICD-10-CM

## 2023-10-18 NOTE — Progress Notes (Signed)
 Office Visit    Patient Name: Steve Burnett Date of Encounter: 10/18/2023  Primary Care Provider:  Mort Sawyers, FNP Primary Cardiologist:  Lorine Bears, MD  Chief Complaint    63 y.o. male with a history of CAD status post non-STEMI in 2013, and inferior STEMI in 2019, hypertension, hyperlipidemia, ischemic cardiomyopathy, palpitations, PVCs, HFmrEF, obesity, and sleep apnea, who presents for CAD and heart failure follow-up.  Past Medical History  Subjective   Past Medical History:  Diagnosis Date   Anxiety    Chicken pox    Chronic combined systolic (congestive) and diastolic (congestive) heart failure (HCC)    a. 01/2018 Echo: EF 35-40%; b. 04/2018 Echo: EF 45-50%, diff HK, Gr2 DD; c. 02/2020 Echo: EF 45-50%, glob HK, Gr1 DD.   Coronary artery disease    a. 02/2012 NSTEMI/Cath/PCI: pLAD 60%, mLAD 80%, dLAD 70, nonobs LCX/RCA --> DES to prox/mid LAD; b. inf STEMI 01/23/18: ost/proxLAD 75% at the prox edge of the previously placed stent, mLAD 75%, OM1 50%, OM2 50%, pRCA 100% s/p PCI/DES, rPLA 70%; c. 02/2018 MV: Ef 44%, apical inferior infarct. No ischemia.   Dilated aortic root (HCC)    a. 02/2020 Echo: Ao root 3.9cm.   Hyperlipidemia    Hypertension    Ischemic cardiomyopathy    a. echo 7/19: EF 35-40%, HK of the inf wall, Gr1DD; b. 04/2018 Echo: EF 45-50%, diff HK, Gr2 DD, mildly dil Ao root/Asc Al. Mild MR. Mild BAE. Nl RV fxn; c. 02/2020 Echo: EF 45-50%, glob HK, Gr1 DD. RVSP ~ 35.38mmHg. Ao root 3.9cm.   Obesity    OSA (obstructive sleep apnea) 03/27/2014   PAF (paroxysmal atrial fibrillation) (HCC)    a. noted during [facility] 01/23/18 during inferior STEMI; b. resolved with IV amiodarone; c. not placed on anticoagulation given brief episode, acute illness, and need for DAPT   PVC's (premature ventricular contractions)    a. 02/2018 Holter: Frq PVC's. No afib. Beta blocker titrated.   VF (ventricular fibrillation) (HCC)    a. noted during [facility] 01/23/2018 s/p defibrillation x 2   Past  Surgical History:  Procedure Laterality Date   CARDIAC CATHETERIZATION  03/12/2012   CORONARY ANGIOPLASTY  03/12/2012   s/p drug eluting stent to the mid LAD : 3.5 x 38 mm Xience drug-eluting stent. Post dilated to 4.0 in mid segment and 4.5 proximally.   CORONARY/GRAFT ACUTE MI REVASCULARIZATION N/A 01/23/2018   Procedure: Coronary/Graft Acute MI Revascularization;  Surgeon: Marcina Millard, MD;  Location: [facility] INVASIVE CV LAB;  Service: Cardiovascular;  Laterality: N/A;   IR INJECT/THERA/INC NEEDLE/CATH/PLC EPI/LUMB/SAC W/IMG  10/06/2022   LEFT HEART CATH AND CORONARY ANGIOGRAPHY N/A 01/23/2018   Procedure: LEFT HEART CATH AND CORONARY ANGIOGRAPHY;  Surgeon: Marcina Millard, MD;  Location: [facility] INVASIVE CV LAB;  Service: Cardiovascular;  Laterality: N/A;    Allergies  Allergies  Allergen Reactions   Flomax [Tamsulosin] Nausea Only    Upset, lightheaded   Atorvastatin Other (See Comments)    Muscle cramps    Crestor [Rosuvastatin Calcium] Other (See Comments)    Cramps   Wellbutrin [Bupropion] Other (See Comments)    Increased anxiety       History of Present Illness      63 y.o. y/o male with the above past medical history including CAD, hypertension, hyperlipidemia, ischemic cardiomyopathy, palpitations, PVCs, nonsustained VT, HFmrEF, obesity, and sleep apnea. In August 2013, he suffered a non-STEMI in the setting of severe mid LAD disease requiring drug-eluting stent placement. In July  2019, he suffered an inferior STEMI and was found to have an occluded right coronary artery and underwent drug-eluting stent placement. This was complicated by VF arrest x2. He also had a brief episode of paroxysmal atrial fibrillation during catheterization and was briefly treated with amiodarone. Catheterization at the time of inferior STEMI also showed moderate in-stent restenosis in the LAD, which has been medically managed. Echocardiogram during admission showed an EF of 35 to 40% with inferior  hypokinesis and grade 1 diastolic dysfunction. A Lexiscan Myoview was performed as an outpatient to evaluate ischemic burden of his LAD in August 2019, and this showed prior inferior infarct without ischemia. He was medically managed. In August 2019, he complained of palpitations and underwent event monitoring which showed frequent PVCs resulting in titration of beta-blocker. Follow-up echocardiogram in October 2019 showed improvement in LV function with an EF of 45 to 50%, and diffuse hypokinesis. In July 2021, he reported episodic tachypalpitations, lasting up to 20 minutes. A Zio monitor was placed and showed 4 runs of nonsustained VT, the longest of which lasted 16 seconds with an average heart rate of 142 bpm. He also had 2 runs of SVT, isolated PACs, and PVCs. Echocardiogram was performed in August 2021 showing an EF of 45 to 50% with global hypokinesis, grade 1 diastolic dysfunction, RVSP of approximately 35.6 mmHg, and an aortic root at 3.9 cm. Continued medical therapy was recommended.     Steve Burnett was last seen in cardiology clinic in October 2024 at which time he was doing well.  Since his last visit, he has continued to do well.  [place] is active [place] his [place] and does not experience chest pain or dyspnea.  Activity is somewhat limited by chronic back pain for which he uses a cane to ambulate long distances but he notes that does not usually bother him with activities around his home and at work.  He denies palpitations, PND, orthopnea, dizziness, syncope, or early satiety.  He will sometimes experience mild ankle swelling if he has been sitting for long periods of time, but this resolves overnight. Objective  Home Medications    Current Outpatient Medications  Medication Sig Dispense Refill   Accu-Chek Softclix Lancets lancets 1 each 3 (three) times daily.     amLODipine (NORVASC) 5 MG tablet Take 1 tablet (5 mg total) by mouth daily. 90 tablet 3   Blood Glucose Monitoring Suppl (ACCU-CHEK GUIDE)  w/Device KIT 1 EACH BY DOES NOT APPLY ROUTE IN THE MORNING, AT NOON, AND AT BEDTIME.     busPIRone (BUSPAR) 5 MG tablet TAKE 1 TABLET BY MOUTH TWICE A DAY 60 tablet 0   carvedilol (COREG) 25 MG tablet Take 1 tablet (25 mg total) by mouth 2 (two) times daily with a meal. 180 tablet 3   clobetasol cream (TEMOVATE) 0.05 % Apply 1 Application topically 2 (two) times daily. 30 g 0   clopidogrel (PLAVIX) 75 MG tablet TAKE 1 TABLET BY MOUTH EVERY DAY 90 tablet 0   hydrOXYzine (ATARAX) 25 MG tablet Take 1 tablet (25 mg total) by mouth 3 (three) times daily as needed for anxiety. 30 tablet 0   lisinopril (ZESTRIL) 40 MG tablet Take 1 tablet (40 mg total) by mouth daily. 90 tablet 3   metFORMIN (GLUCOPHAGE) 500 MG tablet Take 1 tablet (500 mg total) by mouth 2 (two) times daily with a meal. 180 tablet 3   rosuvastatin (CRESTOR) 10 MG tablet TAKE 1 TABLET BY MOUTH EVERY DAY  spironolactone (ALDACTONE) 25 MG tablet TAKE 1 TABLET (25 MG TOTAL) BY MOUTH DAILY. 90 tablet 0   No current facility-administered medications for this visit.     Physical Exam    VS:  BP 122/76   Pulse 67   Ht 5\' 10"  (1.778 m)   Wt (!) 367 lb (166.5 kg)   SpO2 97%   BMI 52.66 kg/m  , BMI Body mass index is 52.66 kg/m.       GEN: Well nourished, well developed, in no acute distress. HEENT: normal. Neck: Supple, no JVD, carotid bruits, or masses. Cardiac: RRR, no murmurs, rubs, or gallops. No clubbing, cyanosis, edema.  Radials 2+/PT 2+ and equal bilaterally.  Respiratory:  Respirations regular and unlabored, clear to auscultation bilaterally. GI: Soft, nontender, nondistended, BS + x 4. MS: no deformity or atrophy. Skin: warm and dry, no rash. Neuro:  Strength and sensation are intact. Psych: Normal affect.  Accessory Clinical Findings    ECG personally reviewed by me today - EKG Interpretation Date/Time:  Thursday October 18 2023 08:23:27 EDT Ventricular Rate:  67 PR Interval:  176 QRS Duration:  88 QT  Interval:  356 QTC Calculation: 376 R Axis:   17  Text Interpretation: Normal sinus rhythm Low voltage QRS Confirmed by Nicolasa Ducking [phone]) on 10/18/2023 8:31:37 AM  - no acute changes.  Lab Results  Component Value Date   WBC 8.2 03/22/2023   HGB 14.0 03/22/2023   HCT 44.0 03/22/2023   MCV 78.9 03/22/2023   PLT 316.0 03/22/2023   Lab Results  Component Value Date   CREATININE 0.86 03/22/2023   BUN 21 03/22/2023   NA 134 (L) 03/22/2023   K 4.0 03/22/2023   CL 99 03/22/2023   CO2 27 03/22/2023   Lab Results  Component Value Date   ALT 17 03/22/2023   AST 14 03/22/2023   ALKPHOS 59 03/22/2023   BILITOT 0.5 03/22/2023   Lab Results  Component Value Date   CHOL 175 03/22/2023   HDL 37.50 (L) 03/22/2023   LDLCALC 76 04/08/2020   LDLDIRECT 129.0 03/22/2023   TRIG 259.0 (H) 03/22/2023   CHOLHDL 5 03/22/2023    Lab Results  Component Value Date   HGBA1C 7.8 (H) 03/22/2023   Lab Results  Component Value Date   TSH 1.240 01/29/2020       Assessment & Plan    1.  CAD: Status post prior LAD and RCA PCI.  Known residual in-stent restenosis of the LAD but did not have anterior ischemia on stress testing in 2019 and has been doing well without chest pain or significant dyspnea.  He remains on beta-blocker, Plavix, ACE inhibitor, and statin.  He has been more compliant with his statin since his last visit and believes he is due for follow-up labs through primary care in the coming months.  2.  Primary hypertension: Blood pressure stable at 122/76.  Continue amlodipine, carvedilol, lisinopril, and spironolactone.  3.  Hyperlipidemia: LDL was 129 in August 2024, at which time he said he was often missing doses of rosuvastatin.  Since then, he has been compliant and tolerating well.  Deferred labs today as he believes he is scheduled for repeat annual labs through primary care in the coming months.  4.  Ischemic cardiomyopathy/chronic heart failure with midrange ejection  fraction: EF 45 to 50% by echo in August 2021.  Overall stable and remains euvolemic on examination without significant dyspnea at home.  He remains on beta-blocker, ACE inhibitor, and  spironolactone.  Stable renal function and electrolytes in August.  He previously tried Ozempic but this caused nausea and forced him to discontinue.  5.  Nonsustained VT/PVCs: Quiescent.  Continue beta-blocker.  6.  Obstructive sleep apnea: Reports compliance with CPAP.  Overall doing well.  7.  History of atrial fibrillation: This occurred at the time of his myocardial infarction 2019 was short-lived.  No known recurrence and maintaining sinus rhythm today.  He is not anticoagulation.  Continue beta-blocker.  8.  Morbid obesity: He is down 3 pounds since his last visit.  We discussed his dietary habits and he says he does not think he can eat any less than he is currently doing.  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  Encouraged greater activity and caloric reduction.  He did not tolerate Ozempic in the past.  9.  Disposition: Follow-up in 6 months or sooner if necessary.  Nicolasa Ducking, NP 10/18/2023, 8:32 AM

## 2023-10-18 NOTE — Patient Instructions (Addendum)
 Medication Instructions:  No changes *If you need a refill on your cardiac medications before your next appointment, please call your pharmacy*   Lab Work: None ordered If you have labs (blood work) drawn today and your tests are completely normal, you will receive your results only by: MyChart Message (if you have MyChart) OR A paper copy in the mail If you have any lab test that is abnormal or we need to change your treatment, we will call you to review the results.   Testing/Procedures: None ordered   Follow-Up: At [facility], you and your health needs are our priority.  As part of our continuing mission to provide you with exceptional heart care, we have created designated Provider Care Teams.  These Care Teams include your primary Cardiologist (physician) and Advanced Practice Providers (APPs -  Physician Assistants and Nurse Practitioners) who all work together to provide you with the care you need, when you need it.  We recommend signing up for the patient portal called "MyChart".  Sign up information is provided on this After Visit Summary.  MyChart is used to connect with patients for Virtual Visits (Telemedicine).  Patients are able to view lab/test results, encounter notes, upcoming appointments, etc.  Non-urgent messages can be sent to your provider as well.   To learn more about what you can do with MyChart, go to [email or web address].    Your next appointment:   6 month(s)  Provider:   You may see Lorine Bears, MD or one of the following Advanced Practice Providers on your designated Care Team:   Nicolasa Ducking, NP

## 2023-10-24 ENCOUNTER — Other Ambulatory Visit: Payer: Self-pay | Admitting: Family

## 2023-10-24 DIAGNOSIS — F411 Generalized anxiety disorder: Secondary | ICD-10-CM

## 2024-01-07 ENCOUNTER — Other Ambulatory Visit: Payer: Self-pay | Admitting: Cardiovascular Disease

## 2024-03-04 ENCOUNTER — Other Ambulatory Visit: Payer: Self-pay | Admitting: Cardiovascular Disease

## 2024-03-24 ENCOUNTER — Other Ambulatory Visit: Payer: Self-pay | Admitting: Family

## 2024-03-24 DIAGNOSIS — E1165 Type 2 diabetes mellitus with hyperglycemia: Secondary | ICD-10-CM

## 2024-03-26 NOTE — Telephone Encounter (Signed)
 I called and left a voicemail for patient to be scheduled.

## 2024-03-31 ENCOUNTER — Other Ambulatory Visit: Payer: Self-pay | Admitting: Family

## 2024-03-31 DIAGNOSIS — I10 Essential (primary) hypertension: Secondary | ICD-10-CM

## 2024-05-30 ENCOUNTER — Other Ambulatory Visit: Payer: Self-pay | Admitting: Cardiovascular Disease

## 2024-06-02 MED ORDER — LISINOPRIL 40 MG PO TABS: 40.0000 mg | ORAL_TABLET | Freq: Every day | ORAL | 1 refills | Status: AC

## 2024-06-02 MED ORDER — CARVEDILOL 25 MG PO TABS: 25.0000 mg | ORAL_TABLET | Freq: Two times a day (BID) | ORAL | 1 refills | Status: AC

## 2024-06-02 MED ORDER — SPIRONOLACTONE 25 MG PO TABS: 25.0000 mg | ORAL_TABLET | Freq: Every day | ORAL | 1 refills | Status: AC

## 2024-07-01 ENCOUNTER — Other Ambulatory Visit: Payer: Self-pay | Admitting: Family

## 2024-07-01 DIAGNOSIS — I10 Essential (primary) hypertension: Secondary | ICD-10-CM

## 2024-07-16 ENCOUNTER — Encounter: Admitting: Family

## 2024-07-29 DIAGNOSIS — I10 Essential (primary) hypertension: Secondary | ICD-10-CM

## 2024-09-04 ENCOUNTER — Ambulatory Visit: Admitting: Cardiovascular Disease
